# Patient Record
Sex: Female | Born: 1940
Health system: Southern US, Community
[De-identification: ages and names within clinical notes are randomized; demographics above are authoritative.]

## PROBLEM LIST (undated history)

## (undated) DIAGNOSIS — Z923 Personal history of irradiation: Secondary | ICD-10-CM

## (undated) DIAGNOSIS — M1990 Unspecified osteoarthritis, unspecified site: Secondary | ICD-10-CM

## (undated) DIAGNOSIS — M069 Rheumatoid arthritis, unspecified: Secondary | ICD-10-CM

## (undated) DIAGNOSIS — R0600 Dyspnea, unspecified: Secondary | ICD-10-CM

## (undated) DIAGNOSIS — Z86711 Personal history of pulmonary embolism: Secondary | ICD-10-CM

## (undated) DIAGNOSIS — G43909 Migraine, unspecified, not intractable, without status migrainosus: Secondary | ICD-10-CM

## (undated) DIAGNOSIS — R0789 Other chest pain: Secondary | ICD-10-CM

## (undated) DIAGNOSIS — D6861 Antiphospholipid syndrome: Secondary | ICD-10-CM

## (undated) DIAGNOSIS — I499 Cardiac arrhythmia, unspecified: Secondary | ICD-10-CM

## (undated) DIAGNOSIS — I8001 Phlebitis and thrombophlebitis of superficial vessels of right lower extremity: Secondary | ICD-10-CM

## (undated) DIAGNOSIS — J849 Interstitial pulmonary disease, unspecified: Secondary | ICD-10-CM

## (undated) DIAGNOSIS — J8410 Pulmonary fibrosis, unspecified: Secondary | ICD-10-CM

## (undated) DIAGNOSIS — H269 Unspecified cataract: Secondary | ICD-10-CM

## (undated) DIAGNOSIS — R200 Anesthesia of skin: Secondary | ICD-10-CM

## (undated) DIAGNOSIS — E785 Hyperlipidemia, unspecified: Secondary | ICD-10-CM

## (undated) DIAGNOSIS — I10 Essential (primary) hypertension: Secondary | ICD-10-CM

## (undated) DIAGNOSIS — C801 Malignant (primary) neoplasm, unspecified: Secondary | ICD-10-CM

## (undated) DIAGNOSIS — E669 Obesity, unspecified: Secondary | ICD-10-CM

## (undated) DIAGNOSIS — K219 Gastro-esophageal reflux disease without esophagitis: Secondary | ICD-10-CM

## (undated) DIAGNOSIS — R32 Unspecified urinary incontinence: Secondary | ICD-10-CM

## (undated) DIAGNOSIS — Z9989 Dependence on other enabling machines and devices: Secondary | ICD-10-CM

## (undated) HISTORY — PX: CARPAL TUNNEL RELEASE: SHX101

## (undated) HISTORY — DX: Unspecified cataract: H26.9

## (undated) HISTORY — DX: Hyperlipidemia, unspecified: E78.5

## (undated) HISTORY — DX: Gastro-esophageal reflux disease without esophagitis: K21.9

## (undated) HISTORY — DX: Essential (primary) hypertension: I10

## (undated) HISTORY — DX: Obesity, unspecified: E66.9

## (undated) HISTORY — DX: Antiphospholipid syndrome: D68.61

## (undated) HISTORY — DX: Unspecified osteoarthritis, unspecified site: M19.90

## (undated) HISTORY — PX: CARDIAC CATHETERIZATION: SHX172

## (undated) HISTORY — DX: Personal history of pulmonary embolism: Z86.711

---

## 1988-10-05 DIAGNOSIS — I8000 Phlebitis and thrombophlebitis of superficial vessels of unspecified lower extremity: Secondary | ICD-10-CM | POA: Insufficient documentation

## 1997-10-05 HISTORY — PX: ROTATOR CUFF REPAIR: SHX139

## 1999-08-14 ENCOUNTER — Other Ambulatory Visit: Admission: RE | Admit: 1999-08-14 | Discharge: 1999-08-14 | Payer: Self-pay | Admitting: Gynecology

## 1999-08-15 ENCOUNTER — Encounter: Admission: RE | Admit: 1999-08-15 | Discharge: 1999-08-15 | Payer: Self-pay | Admitting: Family Medicine

## 1999-08-15 ENCOUNTER — Encounter: Payer: Self-pay | Admitting: Family Medicine

## 2000-08-09 ENCOUNTER — Encounter: Admission: RE | Admit: 2000-08-09 | Discharge: 2000-08-09 | Payer: Self-pay | Admitting: Family Medicine

## 2000-08-09 ENCOUNTER — Encounter: Payer: Self-pay | Admitting: Family Medicine

## 2000-08-30 ENCOUNTER — Encounter: Admission: RE | Admit: 2000-08-30 | Discharge: 2000-08-30 | Payer: Self-pay | Admitting: Orthopedic Surgery

## 2000-08-30 ENCOUNTER — Encounter: Payer: Self-pay | Admitting: Orthopedic Surgery

## 2001-01-14 ENCOUNTER — Encounter: Admission: RE | Admit: 2001-01-14 | Discharge: 2001-01-14 | Payer: Self-pay | Admitting: Family Medicine

## 2001-01-14 ENCOUNTER — Encounter: Payer: Self-pay | Admitting: Family Medicine

## 2001-01-24 ENCOUNTER — Encounter: Payer: Self-pay | Admitting: Family Medicine

## 2001-01-24 ENCOUNTER — Encounter: Admission: RE | Admit: 2001-01-24 | Discharge: 2001-01-24 | Payer: Self-pay | Admitting: Internal Medicine

## 2003-06-04 ENCOUNTER — Encounter: Payer: Self-pay | Admitting: Nurse Practitioner

## 2004-11-18 ENCOUNTER — Other Ambulatory Visit: Admission: RE | Admit: 2004-11-18 | Discharge: 2004-11-18 | Payer: Self-pay | Admitting: Family Medicine

## 2005-10-15 ENCOUNTER — Encounter: Admission: RE | Admit: 2005-10-15 | Discharge: 2005-10-15 | Payer: Self-pay | Admitting: Gynecology

## 2006-05-07 ENCOUNTER — Encounter: Payer: Self-pay | Admitting: Nurse Practitioner

## 2006-10-05 HISTORY — PX: DILATION AND CURETTAGE, DIAGNOSTIC / THERAPEUTIC: SUR384

## 2006-10-05 HISTORY — PX: CARDIOVASCULAR STRESS TEST: SHX262

## 2006-10-05 HISTORY — PX: TOTAL KNEE ARTHROPLASTY: SHX125

## 2006-10-05 LAB — CONVERTED CEMR LAB: Pap Smear: NORMAL

## 2006-10-06 ENCOUNTER — Encounter: Payer: Self-pay | Admitting: Family Medicine

## 2006-10-28 ENCOUNTER — Inpatient Hospital Stay (HOSPITAL_COMMUNITY): Admission: RE | Admit: 2006-10-28 | Discharge: 2006-10-31 | Payer: Self-pay | Admitting: Orthopedic Surgery

## 2007-01-24 ENCOUNTER — Encounter: Payer: Self-pay | Admitting: Family Medicine

## 2007-01-31 ENCOUNTER — Encounter: Payer: Self-pay | Admitting: Nurse Practitioner

## 2007-02-02 HISTORY — PX: CARDIOVASCULAR STRESS TEST: SHX262

## 2007-02-07 HISTORY — PX: US ECHOCARDIOGRAPHY: HXRAD669

## 2007-02-22 ENCOUNTER — Other Ambulatory Visit: Admission: RE | Admit: 2007-02-22 | Discharge: 2007-02-22 | Payer: Self-pay | Admitting: Gynecology

## 2007-03-22 ENCOUNTER — Ambulatory Visit: Admission: RE | Admit: 2007-03-22 | Discharge: 2007-03-22 | Payer: Self-pay | Admitting: Gynecologic Oncology

## 2007-03-22 ENCOUNTER — Encounter (INDEPENDENT_AMBULATORY_CARE_PROVIDER_SITE_OTHER): Payer: Self-pay | Admitting: Gynecologic Oncology

## 2007-04-05 ENCOUNTER — Ambulatory Visit (HOSPITAL_COMMUNITY): Admission: RE | Admit: 2007-04-05 | Discharge: 2007-04-05 | Payer: Self-pay | Admitting: Gynecologic Oncology

## 2007-04-05 ENCOUNTER — Encounter (INDEPENDENT_AMBULATORY_CARE_PROVIDER_SITE_OTHER): Payer: Self-pay | Admitting: Gynecologic Oncology

## 2008-01-04 ENCOUNTER — Ambulatory Visit: Payer: Self-pay | Admitting: Family Medicine

## 2008-01-04 DIAGNOSIS — E785 Hyperlipidemia, unspecified: Secondary | ICD-10-CM | POA: Insufficient documentation

## 2008-01-04 DIAGNOSIS — R87619 Unspecified abnormal cytological findings in specimens from cervix uteri: Secondary | ICD-10-CM

## 2008-01-04 DIAGNOSIS — K219 Gastro-esophageal reflux disease without esophagitis: Secondary | ICD-10-CM | POA: Insufficient documentation

## 2008-01-04 DIAGNOSIS — R5381 Other malaise: Secondary | ICD-10-CM

## 2008-01-04 DIAGNOSIS — M545 Low back pain: Secondary | ICD-10-CM

## 2008-01-04 DIAGNOSIS — M1990 Unspecified osteoarthritis, unspecified site: Secondary | ICD-10-CM | POA: Insufficient documentation

## 2008-01-04 DIAGNOSIS — R5383 Other fatigue: Secondary | ICD-10-CM | POA: Insufficient documentation

## 2008-01-04 DIAGNOSIS — I10 Essential (primary) hypertension: Secondary | ICD-10-CM | POA: Insufficient documentation

## 2008-01-04 DIAGNOSIS — M79609 Pain in unspecified limb: Secondary | ICD-10-CM | POA: Insufficient documentation

## 2008-01-10 DIAGNOSIS — R7303 Prediabetes: Secondary | ICD-10-CM | POA: Insufficient documentation

## 2008-01-10 LAB — CONVERTED CEMR LAB
ALT: 32 units/L (ref 0–35)
AST: 29 units/L (ref 0–37)
Alkaline Phosphatase: 87 units/L (ref 39–117)
BUN: 26 mg/dL — ABNORMAL HIGH (ref 6–23)
Chloride: 105 meq/L (ref 96–112)
Cholesterol: 236 mg/dL (ref 0–200)
Creatinine, Ser: 0.9 mg/dL (ref 0.4–1.2)
Eosinophils Absolute: 0.3 10*3/uL (ref 0.0–0.7)
Eosinophils Relative: 3.8 % (ref 0.0–5.0)
Folate: 7.3 ng/mL
GFR calc Af Amer: 80 mL/min
GFR calc non Af Amer: 66 mL/min
MCHC: 33 g/dL (ref 30.0–36.0)
Monocytes Relative: 9.2 % (ref 3.0–12.0)
Neutro Abs: 4 10*3/uL (ref 1.4–7.7)
Neutrophils Relative %: 51.8 % (ref 43.0–77.0)
Potassium: 4.1 meq/L (ref 3.5–5.1)
RBC: 3.78 M/uL — ABNORMAL LOW (ref 3.87–5.11)
Sodium: 141 meq/L (ref 135–145)
Total Bilirubin: 0.8 mg/dL (ref 0.3–1.2)
Total CHOL/HDL Ratio: 5.2
Total Protein: 7.3 g/dL (ref 6.0–8.3)
Vitamin B-12: 242 pg/mL (ref 211–911)

## 2008-02-01 ENCOUNTER — Ambulatory Visit: Payer: Self-pay | Admitting: Family Medicine

## 2008-02-01 LAB — CONVERTED CEMR LAB
HDL goal, serum: 40 mg/dL
LDL Goal: 130 mg/dL

## 2008-02-02 ENCOUNTER — Telehealth: Payer: Self-pay | Admitting: Family Medicine

## 2008-02-02 ENCOUNTER — Encounter: Payer: Self-pay | Admitting: Family Medicine

## 2008-02-03 ENCOUNTER — Ambulatory Visit: Payer: Self-pay | Admitting: Family Medicine

## 2008-02-03 DIAGNOSIS — J069 Acute upper respiratory infection, unspecified: Secondary | ICD-10-CM | POA: Insufficient documentation

## 2008-02-03 DIAGNOSIS — H6690 Otitis media, unspecified, unspecified ear: Secondary | ICD-10-CM | POA: Insufficient documentation

## 2008-02-06 ENCOUNTER — Telehealth: Payer: Self-pay | Admitting: Family Medicine

## 2008-02-06 ENCOUNTER — Ambulatory Visit: Payer: Self-pay

## 2008-02-06 ENCOUNTER — Encounter: Payer: Self-pay | Admitting: Family Medicine

## 2008-02-08 ENCOUNTER — Telehealth: Payer: Self-pay | Admitting: Family Medicine

## 2008-02-17 ENCOUNTER — Telehealth: Payer: Self-pay | Admitting: Family Medicine

## 2008-03-26 ENCOUNTER — Telehealth: Payer: Self-pay | Admitting: Internal Medicine

## 2008-03-27 ENCOUNTER — Ambulatory Visit: Payer: Self-pay | Admitting: Family Medicine

## 2008-05-03 ENCOUNTER — Ambulatory Visit: Payer: Self-pay | Admitting: Family Medicine

## 2008-05-09 LAB — CONVERTED CEMR LAB
ALT: 31 units/L (ref 0–35)
AST: 31 units/L (ref 0–37)
Albumin: 3.9 g/dL (ref 3.5–5.2)
Alkaline Phosphatase: 68 units/L (ref 39–117)
BUN: 22 mg/dL (ref 6–23)
Bilirubin, Direct: 0.1 mg/dL (ref 0.0–0.3)
Calcium: 8.7 mg/dL (ref 8.4–10.5)
Cholesterol: 230 mg/dL (ref 0–200)
GFR calc non Af Amer: 76 mL/min
Total Protein: 6.9 g/dL (ref 6.0–8.3)
Triglycerides: 162 mg/dL — ABNORMAL HIGH (ref 0–149)

## 2008-06-12 ENCOUNTER — Encounter: Payer: Self-pay | Admitting: Family Medicine

## 2008-06-13 ENCOUNTER — Ambulatory Visit: Payer: Self-pay | Admitting: Family Medicine

## 2008-06-13 ENCOUNTER — Ambulatory Visit: Payer: Self-pay

## 2008-06-13 ENCOUNTER — Encounter: Payer: Self-pay | Admitting: Family Medicine

## 2008-06-13 DIAGNOSIS — R42 Dizziness and giddiness: Secondary | ICD-10-CM

## 2008-06-13 DIAGNOSIS — R0989 Other specified symptoms and signs involving the circulatory and respiratory systems: Secondary | ICD-10-CM

## 2008-06-13 DIAGNOSIS — R0609 Other forms of dyspnea: Secondary | ICD-10-CM

## 2008-06-14 ENCOUNTER — Telehealth (INDEPENDENT_AMBULATORY_CARE_PROVIDER_SITE_OTHER): Payer: Self-pay | Admitting: *Deleted

## 2008-07-11 ENCOUNTER — Ambulatory Visit: Payer: Self-pay | Admitting: Family Medicine

## 2008-07-26 ENCOUNTER — Other Ambulatory Visit: Admission: RE | Admit: 2008-07-26 | Discharge: 2008-07-26 | Payer: Self-pay | Admitting: Gynecology

## 2008-08-27 ENCOUNTER — Encounter: Payer: Self-pay | Admitting: Family Medicine

## 2008-09-05 ENCOUNTER — Encounter (INDEPENDENT_AMBULATORY_CARE_PROVIDER_SITE_OTHER): Payer: Self-pay | Admitting: *Deleted

## 2008-09-17 ENCOUNTER — Telehealth: Payer: Self-pay | Admitting: Family Medicine

## 2008-10-30 ENCOUNTER — Ambulatory Visit: Payer: Self-pay | Admitting: Family Medicine

## 2008-10-31 LAB — CONVERTED CEMR LAB
ALT: 28 units/L (ref 0–35)
Albumin: 3.8 g/dL (ref 3.5–5.2)
Alkaline Phosphatase: 65 units/L (ref 39–117)
BUN: 17 mg/dL (ref 6–23)
Bilirubin, Direct: 0.1 mg/dL (ref 0.0–0.3)
Calcium: 9.5 mg/dL (ref 8.4–10.5)
Direct LDL: 160 mg/dL
Glucose, Bld: 91 mg/dL (ref 70–99)
Total Bilirubin: 0.6 mg/dL (ref 0.3–1.2)
Total CHOL/HDL Ratio: 5.7
Total Protein: 6.8 g/dL (ref 6.0–8.3)
VLDL: 26 mg/dL (ref 0–40)

## 2008-11-02 ENCOUNTER — Ambulatory Visit: Payer: Self-pay | Admitting: Family Medicine

## 2008-11-02 DIAGNOSIS — G5620 Lesion of ulnar nerve, unspecified upper limb: Secondary | ICD-10-CM

## 2008-11-02 LAB — CONVERTED CEMR LAB
Basophils Absolute: 0 10*3/uL (ref 0.0–0.1)
Basophils Relative: 0.1 % (ref 0.0–3.0)
Eosinophils Absolute: 0.3 10*3/uL (ref 0.0–0.7)
Folate: 9.9 ng/mL
HCT: 38.4 % (ref 36.0–46.0)
Hemoglobin: 13.2 g/dL (ref 12.0–15.0)
Monocytes Absolute: 0.5 10*3/uL (ref 0.1–1.0)
Monocytes Relative: 7.6 % (ref 3.0–12.0)
Neutrophils Relative %: 47.8 % (ref 43.0–77.0)
Platelets: 207 10*3/uL (ref 150–400)
RDW: 12.7 % (ref 11.5–14.6)
WBC: 7 10*3/uL (ref 4.5–10.5)

## 2008-12-10 ENCOUNTER — Telehealth: Payer: Self-pay | Admitting: Family Medicine

## 2008-12-17 ENCOUNTER — Telehealth: Payer: Self-pay | Admitting: Family Medicine

## 2009-01-10 ENCOUNTER — Ambulatory Visit: Payer: Self-pay | Admitting: Family Medicine

## 2009-01-31 ENCOUNTER — Ambulatory Visit: Payer: Self-pay | Admitting: Family Medicine

## 2009-02-01 LAB — CONVERTED CEMR LAB
ALT: 25 U/L (ref 0–35)
AST: 24 U/L (ref 0–37)
Cholesterol: 195 mg/dL (ref 0–200)
HDL: 43.3 mg/dL (ref >39.00)
LDL Cholesterol: 133 mg/dL — ABNORMAL HIGH (ref 0–99)
Total CHOL/HDL Ratio: 5
Triglycerides: 94 mg/dL (ref 0.0–149.0)
VLDL: 18.8 mg/dL (ref 0.0–40.0)

## 2009-02-06 ENCOUNTER — Ambulatory Visit: Payer: Self-pay | Admitting: Family Medicine

## 2009-08-14 ENCOUNTER — Ambulatory Visit: Payer: Self-pay | Admitting: Family Medicine

## 2009-08-15 LAB — CONVERTED CEMR LAB
AST: 24 units/L (ref 0–37)
Albumin: 3.9 g/dL (ref 3.5–5.2)
Calcium: 8.9 mg/dL (ref 8.4–10.5)
Creatinine, Ser: 0.7 mg/dL (ref 0.4–1.2)
Direct LDL: 190.7 mg/dL
Glucose, Bld: 101 mg/dL — ABNORMAL HIGH (ref 70–99)
Potassium: 3.7 meq/L (ref 3.5–5.1)
Sodium: 139 meq/L (ref 135–145)
Total Bilirubin: 0.7 mg/dL (ref 0.3–1.2)
Total CHOL/HDL Ratio: 6
VLDL: 23 mg/dL (ref 0.0–40.0)

## 2009-08-22 ENCOUNTER — Ambulatory Visit: Payer: Self-pay | Admitting: Family Medicine

## 2009-08-22 DIAGNOSIS — M25519 Pain in unspecified shoulder: Secondary | ICD-10-CM

## 2009-08-26 ENCOUNTER — Encounter: Payer: Self-pay | Admitting: Family Medicine

## 2009-08-26 ENCOUNTER — Ambulatory Visit: Payer: Self-pay | Admitting: Family Medicine

## 2009-08-28 ENCOUNTER — Encounter: Payer: Self-pay | Admitting: Family Medicine

## 2009-09-20 ENCOUNTER — Encounter (INDEPENDENT_AMBULATORY_CARE_PROVIDER_SITE_OTHER): Payer: Self-pay | Admitting: *Deleted

## 2009-10-11 ENCOUNTER — Telehealth: Payer: Self-pay | Admitting: Family Medicine

## 2009-11-14 ENCOUNTER — Telehealth: Payer: Self-pay | Admitting: Family Medicine

## 2009-12-27 ENCOUNTER — Ambulatory Visit: Payer: Self-pay | Admitting: Family Medicine

## 2010-01-02 LAB — CONVERTED CEMR LAB
ALT: 26 units/L (ref 0–35)
AST: 25 units/L (ref 0–37)
Albumin: 3.8 g/dL (ref 3.5–5.2)
Bilirubin, Direct: 0 mg/dL (ref 0.0–0.3)
CO2: 30 meq/L (ref 19–32)
Creatinine, Ser: 0.7 mg/dL (ref 0.4–1.2)
Direct LDL: 167.6 mg/dL
GFR calc non Af Amer: 87.74 mL/min (ref >60)
HDL: 40.8 mg/dL (ref >39.00)
Triglycerides: 121 mg/dL (ref 0.0–149.0)

## 2010-08-26 ENCOUNTER — Telehealth: Payer: Self-pay | Admitting: Family Medicine

## 2010-09-22 ENCOUNTER — Encounter
Admission: RE | Admit: 2010-09-22 | Discharge: 2010-09-22 | Payer: Self-pay | Source: Home / Self Care | Attending: Gynecology | Admitting: Gynecology

## 2010-10-26 ENCOUNTER — Encounter: Payer: Self-pay | Admitting: Gynecology

## 2010-11-04 NOTE — Progress Notes (Signed)
Summary: temazepam  Phone Note Refill Request Message from:  Fax from Pharmacy on August 26, 2010 2:53 PM  Refills Requested: Medication #1:  TEMAZEPAM 15 MG  CAPS Take 1 capsule by mouth at bedtime as needed for sleep   Supply Requested: 3 months [facility] [phone]   Method Requested: Electronic Initial call taken by: Benny Lennert CMA Duncan Dull),  August 26, 2010 2:53 PM  Follow-up for Phone Call        may phone in 30#, will defer to PCP on 90 day supply. Follow-up by: Eustaquio Boyden  MD,  August 26, 2010 3:58 PM  Additional Follow-up for Phone Call Additional follow up Details #1::        Notify pt I do not do 90 day supply on controlled substances.  Additional Follow-up by: Kerby Nora MD,  August 27, 2010 12:33 AM    Additional Follow-up for Phone Call Additional follow up Details #2::    Rx called in and patient notified. She was aware of no 90 day supply. She didn't request it. She said the pharmacy must have, maybe for insurance/cost?  Follow-up by: Janee Morn CMA Duncan Dull),  August 27, 2010 10:09 AM  Prescriptions: TEMAZEPAM 15 MG  CAPS (TEMAZEPAM) Take 1 capsule by mouth at bedtime as needed for sleep  #30 x 0   Entered and Authorized by:   Eustaquio Boyden  MD   Signed by:   Kerby Nora MD on 08/27/2010   Method used:   Telephoned to ...       [facility] [phone]* (retail)       [address] [place], [place]  [address]       Ph: [phone]       Fax: [phone]   RxID:   [id number]

## 2010-11-04 NOTE — Progress Notes (Signed)
Summary: DICLOFENAC SODIUM  Phone Note Refill Request Call back at [phone] Message from:  Patient on October 11, 2009 1:35 PM  Refills Requested: Medication #1:  DICLOFENAC SODIUM 75 MG  TBEC Take 1 tablet by mouth once daily to two times a day (sometimes) Patient calls and request a refill on above medication, please advise   Method Requested: Electronic Initial call taken by: Linde Gillis CMA Duncan Dull),  October 11, 2009 1:35 PM  Follow-up for Phone Call        [facility] past rx's sent, ? is this corrrect?Marland Kitchen..Marland Kitchenplease put pharm of choice next time. Follow-up by: Kerby Nora MD,  October 11, 2009 2:09 PM  Additional Follow-up for Phone Call Additional follow up Details #1::        Pt called back and [facility] was the correct pharmacy and pt will pick up med later today. Thank you. Lewanda Rife LPN  October 11, 2009 2:56 PM   Patient notified again. Additional Follow-up by: Linde Gillis CMA Duncan Dull),  October 11, 2009 2:57 PM    Prescriptions: DICLOFENAC SODIUM 75 MG  TBEC (DICLOFENAC SODIUM) Take 1 tablet by mouth once daily to two times a day (sometimes)  #60 x 3   Entered and Authorized by:   Kerby Nora MD   Signed by:   Kerby Nora MD on 10/11/2009   Method used:   Electronically to        [facility] [address] (retail)       [address] [place], [place]  [address]       Ph: [phone]       Fax: [phone]   RxID:   [id number]

## 2010-11-04 NOTE — Progress Notes (Signed)
Summary: regarding cholesterol med  Phone Note Call from Patient Call back at Home Phone [phone]   Caller: Patient Summary of Call: Pt states she took fenofibrate for one month back in november, but stopped because of legs and arms hurting.  She is asking if ok to restart it at one half pill a day and see how she does.  Please advise. Initial call taken by: Lowella Petties CMA,  November 14, 2009 9:57 AM  Follow-up for Phone Call        yes.Marland Kitchenokay to start back at 1/2 dose..fenofibrate should not really cause much muscle ache like statins do.  Follow-up by: Kerby Nora MD,  November 14, 2009 4:41 PM  Additional Follow-up for Phone Call Additional follow up Details #1::        Left message for patient to return call.  Linde Gillis CMA Duncan Dull)  November 14, 2009 4:53 PM   Spoke with patient and advised her as instructed.  She wants to know should she take this in the morning or in the evening.  Please advise. Additional Follow-up by: Linde Gillis CMA Duncan Dull),  November 18, 2009 8:13 AM    Additional Follow-up for Phone Call Additional follow up Details #2::    Either. Follow-up by: Kerby Nora MD,  November 18, 2009 10:55 AM  Additional Follow-up for Phone Call Additional follow up Details #3:: Details for Additional Follow-up Action Taken: patient advised.Consuello Masse CMA  Additional Follow-up by: Benny Lennert CMA Duncan Dull),  November 18, 2009 2:25 PM

## 2010-12-29 ENCOUNTER — Telehealth: Payer: Self-pay | Admitting: *Deleted

## 2010-12-29 NOTE — Telephone Encounter (Signed)
Patient advised.

## 2010-12-29 NOTE — Telephone Encounter (Signed)
Patient is going to be scheduling appointment for some numbness she is having in her hands. She is asking if she could schedule lab appt. Prior to check her cholesterol since she hasn't had it checked since last year. Please advise.

## 2010-12-29 NOTE — Telephone Encounter (Signed)
i would wait until OV  More of the history needs to be flushed out to figured out what needs to be done with patient and orders  Would check chol then

## 2010-12-31 ENCOUNTER — Telehealth: Payer: Self-pay | Admitting: *Deleted

## 2010-12-31 DIAGNOSIS — I10 Essential (primary) hypertension: Secondary | ICD-10-CM

## 2010-12-31 DIAGNOSIS — E7800 Pure hypercholesterolemia, unspecified: Secondary | ICD-10-CM

## 2010-12-31 NOTE — Telephone Encounter (Signed)
Pt is asking if she can come in and get labs done, before she goes out of town for the summer.  She says she doesn't need to be seen, just wants labs. Please advise.

## 2010-12-31 NOTE — Telephone Encounter (Signed)
Pt is overdue for medicare wellness visit.. Have her schedule when able. I added  labs into EPIC.Marland Kitchen Please schedule a lab visit prior to Medicare annual wellness appt.

## 2011-01-01 NOTE — Telephone Encounter (Signed)
Patient advised and appt made for labs

## 2011-01-05 ENCOUNTER — Other Ambulatory Visit (INDEPENDENT_AMBULATORY_CARE_PROVIDER_SITE_OTHER): Payer: 59 | Admitting: Family Medicine

## 2011-01-05 DIAGNOSIS — E7800 Pure hypercholesterolemia, unspecified: Secondary | ICD-10-CM

## 2011-01-05 DIAGNOSIS — I10 Essential (primary) hypertension: Secondary | ICD-10-CM

## 2011-01-05 LAB — COMPREHENSIVE METABOLIC PANEL
ALT: 23 U/L (ref 0–35)
Albumin: 4.1 g/dL (ref 3.5–5.2)
BUN: 18 mg/dL (ref 6–23)
CO2: 26 mEq/L (ref 19–32)
GFR: 118.03 mL/min (ref >60.00)
Glucose, Bld: 96 mg/dL (ref 70–99)
Potassium: 4 mEq/L (ref 3.5–5.1)
Total Bilirubin: 0.8 mg/dL (ref 0.3–1.2)

## 2011-01-05 LAB — LIPID PANEL
Cholesterol: 224 mg/dL — ABNORMAL HIGH (ref 0–200)
Total CHOL/HDL Ratio: 5
VLDL: 18.2 mg/dL (ref 0.0–40.0)

## 2011-01-11 ENCOUNTER — Other Ambulatory Visit: Payer: Self-pay | Admitting: Family Medicine

## 2011-02-17 NOTE — Op Note (Signed)
NAMEALIVIANA, [name]          ACCOUNT NO.:  [id number]   MEDICAL RECORD NO.:  [id number]          PATIENT TYPE:  AMB   LOCATION:  DAY                          FACILITY:  [facility]   PHYSICIAN:  John T. Kyla Balzarine, M.D.    DATE OF BIRTH:  July 08, 1941   DATE OF PROCEDURE:  04/05/2007  DATE OF DISCHARGE:                               OPERATIVE REPORT   PREOPERATIVE DIAGNOSIS:  Postmenopausal bleeding with inadequate  endometrial biopsy.   POSTOPERATIVE DIAGNOSIS:  Postmenopausal bleeding with inadequate  endometrial biopsy.   PROCEDURE:  Examination under anesthesia with fractional dilatation and  curettage.   SURGEON:  John T. Kyla Balzarine, M.D.   ANESTHESIA:  LMA general.   DESCRIPTION OF FINDINGS AND INDICATIONS FOR SURGERY:  This 70 year old  woman had menopausal bleeding with inadequate endometrial biopsy.  Examination under anesthesia revealed an upper limits size uterus that  sounded 7-8 cm.  No adnexal masses were palpated.  Scant endometrial  curettings were obtained.   DESCRIPTION OF PROCEDURE:  The patient was prepped and draped in the low  lithotomy position using direct placement stirrups.  Examination under  anesthesia revealed findings as described above.  The cervix was flush  with the vaginal fornices and the mucosa was atrophic.  The cervix was  grasped with a tenaculum and uterus sounded to approximately 7-8 cm.  The cervix was serially dilated to #23 [place] dilator and sounded  again to a similar size.  Four quadrant curettings were carried out  after endocervical curettings were obtained separately.  There was scant  tissue on the four quadrant curettings.  Circumferential curettage was  performed to uterine cry and the cavity was explored with polyp forceps,  again yielding scant fragments of tissue.  There was no bleeding after  removal of the tenaculum.  The patient tolerated the procedure well and  was returned to the recovery room in stable condition.   Estimated blood  loss nil.   TRANSFUSIONS:  None.   DRAINS AND PACKS:  None.   COUNTS:  Sponge and sponge counts correct.   PATHOLOGY SPECIMEN:  Endocervical and endometrial curettings for final  pathology.      John T. Kyla Balzarine, M.D.  Electronically Signed     JTS/MEDQ  D:  04/05/2007  T:  04/05/2007  Job:  [id number]   cc:   Leatha Gilding. Mezer, M.D.  Fax: [phone]   Telford Nab, R.N.  [address]  [place], [place] [address]

## 2011-02-17 NOTE — Consult Note (Signed)
[name], [name]          ACCOUNT NO.:  [id number]   MEDICAL RECORD NO.:  [id number]          PATIENT TYPE:  OUT   LOCATION:  GYN                          FACILITY:  [facility]   PHYSICIAN:  Paola A. Duard Brady, MD    DATE OF BIRTH:  January 06, 1941   DATE OF CONSULTATION:  03/22/2007  DATE OF DISCHARGE:                                 CONSULTATION   REFERRING PHYSICIAN:  Leatha Gilding. Mezer, M.D.   The patient is seen today in consultation at the request of Dr. Chevis Pretty.  [name] is a 70 year old gravida 22, para 6, abortus 3, who has been  having postmenopausal bleeding since back in 2006.  In March 2006, she  underwent hysteroscopy D&C which revealed polyps and benign endometrial  tissue. She has continued to have intermittent bleeding since that time.  In November 2006, she had postmenopausal bleeding.  It appears that the  specimen was most consistent with an endocervical curettage as there was  HPV viruses effect.  In July 2007, she had bleeding. Endometrial biopsy  was performed that was limited secondary to scant material, but there  was no evidence of carcinoma.  She recently again represented with  postmenopausal bleeding. On May 20, she had a negative Pap smear at that  time. She came back on May 20 for sonohistogram.  The findings included  a symptomatic cervix and endometrial stripe of 6.7 mm with fluid.  Ovaries were normal.  There was no free fluid. On saline sonogram, she  had a normal cavity. Because of this continued postmenopausal bleeding,  she is referred to [place] today.  The patient is otherwise without  complaints.  She states that when she had her bleeding in May 2007, she  was significantly constipated, had to bear  down a significant amount  have a bowel movement and then noticed a brown sandy discharge.  She  otherwise felt that she was about to have her period, and it was very  much like a normal period was when she was younger.  She has been on  hormone replacement  therapy for 4 years, starting at the age of 26-54.  She stopped it secondary to significant bleeding.  She states her mother  also had scant postmenopausal bleeding throughout her life.   PAST MEDICAL HISTORY:  Hypertension.   MEDICATIONS:  Enalapril, Avapro, Voltaren p.r.n.   ALLERGIES:  CODEINE which cause nausea and vomiting.   PAST SURGICAL HISTORY:  1. Left knee replacement.  2. Bilateral rotator cuff repair as to when   PAST GYN HISTORY:  She is gravida 9, para 6-4-3.   SOCIAL HISTORY:  She denies use of tobacco.  She drinks alcohol every  evening.  She has been married for 51 years.   FAMILY HISTORY:  Mother had hypertension.  Her father had hypertension.  She had a brother who died of probable lung cancer.   PHYSICAL EXAMINATION:  VITAL SIGNS:  High 5 feet 4 inches,  weight 191  pounds.  GENERAL:  Well-nourished, well-developed female in no acute distress.  PELVIC:  External genitalia is within normal limits, though somewhat  atrophic.  The  cervix is multiparous.  The vagina is clean.  There are  no visible lesions or discharge.  After obtaining the patient's verbal  consent, the cervix was grasped with on the anterior lip with a single-  tooth tenaculum.  A finder was inserted through the internal os without  difficulty.  Endometrial biopsy was performed.  The uterus sounded to  7.5 cm.  Approximately 2-3 mL  of a motor oil type fluid was obtained.  There was scant other tissue. Three passes were performed.  The patient  tolerated it well.   ASSESSMENT:  A 70 year old with postmenopausal bleeding with negative  workup today.  Plan to follow up the results of her biopsy from today  and determine her disposition pending the results.      Paola A. Duard Brady, MD  Electronically Signed     PAG/MEDQ  D:  03/22/2007  T:  03/22/2007  Job:  [id number]   cc:   Leatha Gilding. Mezer, M.D.  Fax: [phone]   Donia Guiles, M.D.  Fax: [phone]   Telford Nab, R.N.  [address]  [place], [place] [address]

## 2011-02-20 NOTE — Discharge Summary (Signed)
NAMEBRENIYA, [name]          ACCOUNT NO.:  [id number]   MEDICAL RECORD NO.:  [id number]          PATIENT TYPE:  INP   LOCATION:  1503                         FACILITY:  [facility]   PHYSICIAN:  Madlyn Frankel. Charlann Boxer, M.D.  DATE OF BIRTH:  1941-05-11   DATE OF ADMISSION:  10/28/2006  DATE OF DISCHARGE:  10/31/2006                               DISCHARGE SUMMARY   ADMISSION DIAGNOSES:  1. Osteoarthritis.  2. Hypertension.  3. Uterine fibroids.   DISCHARGE DIAGNOSES:  1. Hypertension.  2. Osteoarthritis.  3. Uterine fibroids.  4. Postoperative hypokalemia.   CONSULTATIONS:  None.   PROCEDURE:  Left total knee replacement.   SURGEON:  Dr. Durene Romans.   ASSISTANT:  Dwyane Luo, PA-C.   HISTORY OF PRESENT ILLNESS:  [name] is a 70 year old female with a  history of persistent progressive knee pain.  It has been refractory to  all conservative treatments.  She did discuss with Dr. 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  She was scheduled for a left total knee  replacement.   Preadmission labs included CBC with a hemoglobin 13.2, hematocrit 38.6  tracked throughout her course of stay.  She was stable upon discharge  with a hemoglobin of 10.9, hematocrit 31.8.  Preadmission white cell differential within normal limits.  Preadmission coagulation INR 0.9.  Preadmission routine chemistries showed sodium 146, potassium 4.1.  Glucose 142 tracked throughout her course of stay.  She did have a drop  in potassium.  On postop day #1, her potassium was 3.2.  Her sodium was  134; glucose was 16.  She was given some K-Dur.  Sodium 134, potassium  2.9, glucose 118.  On the next day it was 127.  Her sodium was 138,  potassium 3.2, glucose 122.  Preadmission urinalysis negative.  EKG showed normal sinus rhythm.   HOSPITAL COURSE:  The patient underwent knee replacement and was  admitted to the orthopedic floor in stable  and improved condition.  Pain  was well-controlled.  She remained neuromuscularly and vascularly intact  throughout her course of stay.  Her dressing was clean, dry and intact  on postop day #1.  PT and OT was begun with weightbearing as tolerated.  We gave her some K-Dur 40 mEq to help replace some of her potassium.  We  encouraged some juice and soda to increase her sodium.  Her discharge  plan was on for Sunday of that week.  PT originally documented that the  patient was motivated and wanted to be discharged home with family as  quickly as possible with home health care physical therapy.  Occupational therapy in their assessment recommended some home health  care occupational therapy.  After further evaluation, they determined  there were no acute occupational therapy needs.  On postop day #2, the  patient was doing well.  The dressing was changed.  Her wound was intact  with no active drainage.  She had a negative straight leg raise.  Discharge plan was for the next day.  Her potassium was 2.9.  Again, we  replaced it with some  more K-Dur.  On postop day #3,  the patient was  doing well, ready to be discharged home.  She was afebrile.  Her  potassium was up to 3.2.  Her incision was clean, dry and intact with no  active drainage.  She was neuromuscularly and vascularly intact.  She  was ready for discharge home.   DISCHARGE DISPOSITION:  Discharged home in stable and improved condition  with home health care physical therapy.   DISCHARGE MEDICATIONS:  1. Lovenox 30 mg, 1 subcu q.12 h. x11 days.  2. Norco 5/325, 1-2 p.o. q.4-6 h. p.r.n. pain.  3. Robaxin 500 mg, 1 p.o. q.6 h. p.r.n. muscle spasm.  4. Colace 100 mg, 1 p.o. b.i.d. constipation.  5. Enteric-coated aspirin 325 mg, 1 tablet daily x4 weeks after      Lovenox.  6. Iron 325 mg 1 p.o. t.i.d. x2 weeks.  7. K-Dur 1 tablet daily x2 weeks.   HOME MEDICATIONS:  Atenolol with hydrochlorothiazide 50/25, 1 p.o.  q.a.m.    DISCHARGE WOUND CARE:  Keep wound dry.  Change dressings daily.   DISCHARGE PHYSICAL THERAPY:  She is weightbearing as tolerated with the  use of a rolling walker.  After 2 weeks, will transition to a single-  point cane.  Goals of therapy will be to minimize pain, increase range  of motion, maximize strength, and encourage independence in activities  of daily living.  Goals of therapy will be 0-120 degrees at the 6-week  mark.   DISCHARGE FOLLOWUP:  Follow up with Dr. Charlann Boxer at [phone] in 2 weeks for  a wound care check.  If she develops any acute shortness of breath or  severe calf pain, please call emergency services immediately.     ______________________________  Yetta Glassman [address], [place]      Madlyn Frankel. Charlann Boxer, M.D.  Electronically Signed    BLM/MEDQ  D:  12/10/2006  T:  12/10/2006  Job:  [id number]

## 2011-02-20 NOTE — H&P (Signed)
[name], [name]          ACCOUNT NO.:  [id number]   MEDICAL RECORD NO.:  [id number]        PATIENT TYPE:  LINP   LOCATION:                               FACILITY:  [facility]   PHYSICIAN:  Madlyn Frankel. Charlann Boxer, M.D.  DATE OF BIRTH:  17-Oct-1940   DATE OF ADMISSION:  10/28/2006  DATE OF DISCHARGE:                              HISTORY & PHYSICAL   PROCEDURE:  Will be a left total knee replacement.   HISTORY AND PHYSICAL/CHIEF COMPLAINT:  Left knee pain.   HISTORY OF PRESENT ILLNESS:  This is a 70 year old female with advanced  osteoarthritis.  It has been refractory to all conservative treatment.  She is scheduled for a left total knee replacement.   PAST MEDICAL HISTORY:  Includes hypertension and uterine fibroids.   PAST SURGICAL HISTORY:  Includes right shoulder rotator cuff surgery in  1998 and left shoulder rotator cuff repair in 2004.   FAMILY HISTORY:  Hypertension, stroke.   SOCIAL HISTORY:  She is married to husband, [name].  He will be the  main caregiver after surgery.   DRUG ALLERGIES:  CODEINE.   MEDICATIONS:  Atenolol 50/25 1 tablet p.o. daily.   REVIEW OF SYSTEMS:  No new signs or symptoms of any cardiovascular,  respiratory, gastrointestinal, genitourinary, neurological,  musculoskeletal system complaints.   PHYSICAL EXAM:  Temperature 90.2. Pulse 68.  Respirations 18.  Blood  pressure 134/68.  GENERAL:  Awake, alert, and oriented, well-developed, well-nourished in  no acute distress.  NECK:  No carotid bruits.  CHEST/LUNGS:  Clear to auscultation bilaterally.  BREASTS:  Deferred.  HEART:  Regular rate and rhythm without gallops, clicks, rubs, or  murmurs.  ABDOMEN:  Soft, nontender, nondistended.  Bowel sounds present.  GENITOURINARY:  Deferred.  EXTREMITIES:  Walks with a slight limp, favoring lower left extremity.  Tenderness along the medial side of the knee.  Ligaments intact.  SKIN:  No sign of cellulitis.  Dorsalis pedis pulses positive.  NEUROLOGIC:  Intact distal sensibilities.   LABS:  Pending.   X-rays of the knee AP and lateral.   IMPRESSION:  Left knee osteoarthritis.   PLAN OF ACTION:  Left total knee replacement at [facility] on  October 28, 2006.  Surgeon, Dr. Durene Romans.  Risks and complications  were discussed.  Questions were encouraged, answered, and reviewed.     ______________________________  [name] [name], [place]      Madlyn Frankel. Charlann Boxer, M.D.  Electronically Signed    BLM/MEDQ  D:  10/25/2006  T:  10/25/2006  Job:  [id number]   cc:   Donia Guiles, M.D.  Fax: [phone]

## 2011-02-20 NOTE — Op Note (Signed)
NAMEBRITANI, BEATTIE          ACCOUNT NO.:  [id number]   MEDICAL RECORD NO.:  [id number]          PATIENT TYPE:  INP   LOCATION:  0005                         FACILITY:  [facility]   PHYSICIAN:  Madlyn Frankel. Charlann Boxer, M.D.  DATE OF BIRTH:  Mar 01, 1941   DATE OF PROCEDURE:  10/28/2006  DATE OF DISCHARGE:                               OPERATIVE REPORT   PREOPERATIVE DIAGNOSIS:  Left knee end-stage osteoarthritis.   POSTOPERATIVE DIAGNOSIS:  Left knee end-stage osteoarthritis.   PROCEDURE:  Left total knee replacement.   COMPONENTS USED:  DePuy rotating platform knee system posterior  stabilized with 2.5 femur, 2.5 tibia, 10 mm insert, and a size 35  patellar button.   SURGEON:  Dr. Charlann Boxer   ASSISTANT:  Dwyane Luo, PA-C   ANESTHESIA:  Dermal spinal plus MAC.   COMPLICATIONS:  None.   DRAINS:  x1.   TOURNIQUET TIME:  50 minutes 250 mmHg.   INDICATIONS FOR PROCEDURE:  Ms. Veith is a pleasant 70 year old  female presented the office for evaluation of some left knee pain.   This been progressive.  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   We discussed treatment option including knee replacement which she had  already been reviewed before by another surgeon.  Risks of infection,  DVT, persistent discomfort postop as well as a postoperative course and  expectations were all reviewed.  Consent obtained.   PROCEDURE IN DETAIL:  The patient was brought to the operative theater.  Once adequate anesthesia and preoperative antibiotics, 2 grams of Ancef,  were administered, the patient was positioned supine.  Proximal thigh  tourniquet was placed.  Left lower extremity was then prescrubbed then  prepped and draped in a sterile fashion from the ankle to the tourniquet  and covered with an impervious stockinette.  Midline incision was made  followed by modified  median parapatellar arthrotomy with patella  subluxation rather than eversion.  Knee exposure obtained in routine  fashion including partial meniscectomies.   Following knee exposure, the femoral canal was opened with a drill,  irrigated to prevent fat emboli, and intramedullary guide passed into  the femur.  It had 5 degrees of valgus; 10 mm of her bone was resected  off the distal femur.   I then measured the height of the femur and felt there it was a size  2.5.  The anterior, posterior, and chamfer cuts were all made without  complication or notching.   Rotation based on the posterior condylar axis was perpendicular to  Whiteside's line.   Following this, the trochlear box cut was made based on the lateral  aspect of the distal femur.   Following this, attention was directed to the tibia.  Tibial exposure  was obtained including finalizing the meniscectomies.  With the  extramedullary guide in position, I resected 10 mm of bone off the  lateral proximal tibia.   This was removed, debrided.  The cut surface measured 2.5.   With the 2.5 tibia in place, I checked the alignment  rod and was very  happy in the AP and lateral planes that the tibia was cut perpendicular.  It was then pinned in this position, drilled the keel punch for the  final tibia.   Please note that following the bone cuts, I checked with a 10 mm spacer,  and the knee came out to full extension.   The trial reduction was then carried out with the tibial component in  place, the femoral component placed, and the 10 mm insert.  The knee  came to full extension.   At this point, synovial tissue was debrided around the inferior and  proximal aspects of the patella.  I then measured the patella to be 22  mm, resected it down to 14 mm, and placed a 35 patellar button.  Post  cut and with the patellar button in place, it was back up to 23 mm.  The  patella tracked within the trochlear groove without application of  any  pressure with no subluxation.   At this point, the trial components were removed, final debridement of  the knee carried out as needed.   I then irrigated the wound with copious amounts of normal saline  solution.  I injected the knee with 60 mL of 0.25% Marcaine with  epinephrine and 1 mL of Toradol.   Following this, the final components were brought to the field.  Cement  was mixed on a back table.   With the knee exposed and dried, the components were cemented into  position with tibia first, then femur and then patella.  The 10 mm  insert was placed to allow the cement to cure.  Once the cement had  cured, excessive cement was debrided throughout the knee including the  posterior aspect of the knee medial and laterally.  There was no  obviously visible cement at this point, and so I was happy and satisfied  this was done.  The final 10 mm insert was placed.   At this point, the knee was again irrigated with 500 mL saline.  A  medium Hemovac drain was placed deep.  I reapproximated the extensor  mechanism using #1 Vicryl in the knee in flexion and subcutaneous layer  with 2-0  Vicryl in a running 4-0 Monocryl on the skin.  The skin was cleaned,  dried, and dressed sterilely with Steri-Strips and a bulky sterile  dressing.  The patient was brought to the recovery room in stable  condition with a knee immobilizer.      Madlyn Frankel Charlann Boxer, M.D.  Electronically Signed     MDO/MEDQ  D:  10/28/2006  T:  10/28/2006  Job:  [id number]

## 2011-03-19 ENCOUNTER — Encounter: Payer: Self-pay | Admitting: Cardiovascular Disease

## 2011-07-21 LAB — CBC
HCT: 37
Hemoglobin: 13

## 2011-07-21 LAB — BASIC METABOLIC PANEL
CO2: 29
GFR calc Af Amer: >60
Glucose, Bld: 117 — ABNORMAL HIGH
Potassium: 4.2
Sodium: 141

## 2011-07-21 LAB — DIFFERENTIAL
Lymphocytes Relative: 30
Monocytes Relative: 7
Neutrophils Relative %: 58

## 2011-08-07 ENCOUNTER — Ambulatory Visit (INDEPENDENT_AMBULATORY_CARE_PROVIDER_SITE_OTHER): Payer: 59 | Admitting: Family Medicine

## 2011-08-07 ENCOUNTER — Encounter: Payer: Self-pay | Admitting: Family Medicine

## 2011-08-07 VITALS — BP 120/74 | HR 72 | Temp 98.5°F | Ht 64.0 in | Wt 193.8 lb

## 2011-08-07 DIAGNOSIS — M5430 Sciatica, unspecified side: Secondary | ICD-10-CM

## 2011-08-07 DIAGNOSIS — M5432 Sciatica, left side: Secondary | ICD-10-CM

## 2011-08-07 MED ORDER — TRAMADOL HCL 50 MG PO TABS: 50.0000 mg | ORAL_TABLET | Freq: Four times a day (QID) | ORAL | Status: AC | PRN

## 2011-08-07 MED ORDER — PREDNISONE 20 MG PO TABS: ORAL_TABLET | ORAL | Status: DC

## 2011-08-07 MED ORDER — CYCLOBENZAPRINE HCL 5 MG PO TABS: ORAL_TABLET | ORAL | Status: DC

## 2011-08-07 NOTE — Patient Instructions (Signed)
Ice then heat on buttock.  Start prednisone taper, take in mornings.  Use pain medicine during day as needed, may use muscle relaxant at night.  Start gentle stretching exercises as tolerated.  Follow up if not improving as expected in 2 weeks.

## 2011-08-07 NOTE — Assessment & Plan Note (Addendum)
May also have some element of gluteal strain and spasm. Treat with prednisone given radicular pain and positive SLR. Pain control with tramadol and cyclobenzaprine for muscle relaxant.  Given info on gentle stretches, ice.

## 2011-08-07 NOTE — Progress Notes (Signed)
  Subjective:    Patient ID: [name], female    DOB: Feb 13, 1941, 70 y.o.   MRN: [id number]  HPI  70 year old female with hx of HTN, high cholesterol, prediabetes not seen in our office since 2010.. Presents with new onset left buttock pain off and over past few month.  Then 1 week ago when moved large piece of furniture.. Felt  "tear in buttock"  Immediate sharp pain, she fell to floor with pain. Now cannot sit on left buttock. Pain does run down left posterior leg to knee. No numbness, no weakness in legs.  Pain better with lying down and not moving. Has chronic mild  low back pain.   She has been taking tylenol, limited activity, tried diclofenac for pain but stopped because she felt may be making her dizzy.  Has CPX at [facility] yearly. .  Review of Systems  Constitutional: Negative for fever and fatigue.  HENT: Negative for ear pain.   Eyes: Negative for pain.  Respiratory: Negative for chest tightness and shortness of breath.   Cardiovascular: Negative for chest pain, palpitations and leg swelling.  Gastrointestinal: Negative for abdominal pain.  Genitourinary: Negative for dysuria.       Objective:   Physical Exam  Constitutional: She is oriented to person, place, and time. She appears well-developed and well-nourished. No distress.  HENT:  Head: Normocephalic and atraumatic.  Eyes: Conjunctivae and EOM are normal. Pupils are equal, round, and reactive to light.  Neck: Normal range of motion. Neck supple. No thyromegaly present.  Cardiovascular: Normal rate, regular rhythm, normal heart sounds and intact distal pulses.  Exam reveals no gallop and no friction rub.   No murmur heard. Pulmonary/Chest: Effort normal and breath sounds normal.  Musculoskeletal:       Lumbar back: She exhibits decreased range of motion and tenderness. She exhibits no bony tenderness.       Mild chronic low back pain Pain greatest in left buttock at sciatic notch. Positive SLR, neg  faber's on left.   Neurological: She is alert and oriented to person, place, and time.  Skin: She is not diaphoretic.          Assessment & Plan:

## 2011-09-01 ENCOUNTER — Ambulatory Visit (INDEPENDENT_AMBULATORY_CARE_PROVIDER_SITE_OTHER): Payer: 59 | Admitting: Family Medicine

## 2011-09-01 ENCOUNTER — Encounter: Payer: Self-pay | Admitting: Family Medicine

## 2011-09-01 VITALS — BP 120/72 | HR 97 | Temp 98.0°F | Ht 65.0 in | Wt 195.0 lb

## 2011-09-01 DIAGNOSIS — M79609 Pain in unspecified limb: Secondary | ICD-10-CM

## 2011-09-01 DIAGNOSIS — S53449A Ulnar collateral ligament sprain of unspecified elbow, initial encounter: Secondary | ICD-10-CM

## 2011-09-01 DIAGNOSIS — M79641 Pain in right hand: Secondary | ICD-10-CM

## 2011-09-01 LAB — CBC WITH DIFFERENTIAL/PLATELET
Basophils Absolute: 0 10*3/uL (ref 0.0–0.1)
Basophils Relative: 0.4 % (ref 0.0–3.0)
Eosinophils Absolute: 0.3 10*3/uL (ref 0.0–0.7)
Eosinophils Relative: 2.9 % (ref 0.0–5.0)
Lymphocytes Relative: 28 % (ref 12.0–46.0)
MCV: 92 fl (ref 78.0–100.0)
Neutro Abs: 5.8 10*3/uL (ref 1.4–7.7)
WBC: 9.5 10*3/uL (ref 4.5–10.5)

## 2011-09-01 LAB — URIC ACID: Uric Acid, Serum: 5 mg/dL (ref 2.4–7.0)

## 2011-09-01 MED ORDER — HYDROCODONE-ACETAMINOPHEN 5-500 MG PO TABS: 1.0000 | ORAL_TABLET | Freq: Four times a day (QID) | ORAL | Status: AC | PRN

## 2011-09-01 MED ORDER — DICLOFENAC SODIUM 75 MG PO TBEC: 75.0000 mg | DELAYED_RELEASE_TABLET | Freq: Two times a day (BID) | ORAL | Status: DC

## 2011-09-01 NOTE — Patient Instructions (Signed)
Recheck in 3 weeks 

## 2011-09-01 NOTE — Progress Notes (Signed)
  Patient Name: [name] Date of Birth: 1941-07-16 Age: 71 y.o. Medical Record Number: [id number] Gender: female  History of Present Illness:  [name] is a 70 y.o. very pleasant female patient who presents with the following:  R hand -- Sunday, 3 days ago.  Mostly all R thumb.  The patient awoke Sunday, 3 days ago, with some right-sided radial side it hand and thumb pain. No trauma or incident initially. She continued to have some pain throughout the course of the day. 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  She does have a young grandchild, and when she was picking the child up during the day she felt some acute pain on the ulnar side of her MCP joint on the first digit on the right hand. She continued to have some significant swelling. She has taken some anti-inflammatories, and she put some Voltaren gel. That did not help, and she is here for evaluation.  She has no history of gout or pseudogout that she can recall.  Past Medical History, Surgical History, Social History, Family History, and Problem List have been reviewed in EHR and updated if relevant.  Review of Systems:  GEN: No fevers, chills. Nontoxic. Primarily MSK c/o today. MSK: Detailed in the HPI GI: tolerating PO intake without difficulty Neuro: No numbness, parasthesias, or tingling associated. Otherwise the pertinent positives of the ROS are noted above.    Physical Examination: Filed Vitals:   09/01/11 1049  BP: 120/72  Pulse: 97  Temp: 98 F (36.7 C)  TempSrc: Oral  Height: 5\' 5"  (1.651 m)  Weight: 195 lb (88.451 kg)  SpO2: 97%     GEN: WDWN, NAD, Non-toxic, Alert & Oriented x 3 HEENT: Atraumatic, Normocephalic.  Ears and Nose: No external deformity. EXTR: No clubbing/cyanosis/edema NEURO: Normal gait.  PSYCH: Normally interactive. Conversant. Not depressed or anxious appearing.  Calm demeanor.   Right hand: Full range of motion. There is some diffuse swelling more  on the radial aspect, but also relatively diffuse on the dorsum of the hand. She has some significant synovitis on all MCP joints. There is greatest swelling and pain around the first MCP. There is some mild degree of redness. There is no warmth. There is also some bogginess at the wrist. Flexion and extension is intact throughout in all digits. The patient has no significant pain with the medial collateral ligament being stressed at the first MCP.Ulnar collateral ligament stress causes significant pain, but he does not open.  Assessment and Plan: 1. Hand pain, right  Uric acid, CBC with Differential  2. Ulnar collateral ligament sprain      Examination most consistent with ulnar collateral ligament injury of the right hand. Place the patient in a thumb spica splint. We will keep her on mobilized over the next 3 weeks. She can use tramadol up to 100 mg or times a day. I also gave her some Vicodin to use on a when necessary basis if she has severe pain.  I also would like to check a CBC to rule out potential infection or cellulitis, and a uric acid since gout would be in my differential with her pain showing a prior injury.

## 2011-09-04 ENCOUNTER — Ambulatory Visit (INDEPENDENT_AMBULATORY_CARE_PROVIDER_SITE_OTHER): Payer: 59 | Admitting: Internal Medicine

## 2011-09-04 ENCOUNTER — Encounter: Payer: Self-pay | Admitting: Internal Medicine

## 2011-09-04 VITALS — BP 139/75 | HR 83 | Temp 98.5°F | Ht 65.0 in | Wt 195.8 lb

## 2011-09-04 DIAGNOSIS — M79609 Pain in unspecified limb: Secondary | ICD-10-CM

## 2011-09-04 DIAGNOSIS — M79641 Pain in right hand: Secondary | ICD-10-CM | POA: Insufficient documentation

## 2011-09-04 MED ORDER — DICLOFENAC SODIUM 75 MG PO TBEC: 75.0000 mg | DELAYED_RELEASE_TABLET | Freq: Two times a day (BID) | ORAL | Status: DC

## 2011-09-04 NOTE — Assessment & Plan Note (Signed)
Has indurated area that may be related to initial injury using knife No discrete mass  No infection  P: continue splint per Dr Patsy Lager     Analgesics     Can try ice if gets sore instead of heat

## 2011-09-04 NOTE — Progress Notes (Signed)
  Subjective:    Patient ID: [name], female    DOB: 04-08-1941, 70 y.o.   MRN: [id number]  HPI Got nervous with right hand Used the spica splint yesterday and noticed a lump--in interdigital space between first and second fingers Increased pain yesterday and today Needed the pain pills  Some redness over the site Overall swelling may be some better from 3 days ago  No fever No known injury  Current Outpatient Prescriptions on File Prior to Visit  Medication Sig Dispense Refill  . diclofenac (VOLTAREN) 75 MG EC tablet Take 1 tablet (75 mg total) by mouth 2 (two) times daily.  60 tablet  2  . Enalapril-Hydrochlorothiazide 5-12.5 MG per tablet TAKE 1 TABLET DAILY  90 tablet  2  . HYDROcodone-acetaminophen (VICODIN) 5-500 MG per tablet Take 1 tablet by mouth every 6 (six) hours as needed for pain.  40 tablet  0    Allergies  Allergen Reactions  . Codeine     REACTION: Nausea and vomiting    Past Medical History  Diagnosis Date  . Hyperlipidemia   . Hypertension   . Osteoarthritis   . GERD (gastroesophageal reflux disease)     Past Surgical History  Procedure Date  . Cardiac catheterization 1990s    negative  . Cardiovascular stress test 2008    NML  . Dilation and curettage, diagnostic / therapeutic 2008  . Total knee arthroplasty 2008    Left  . Rotator cuff repair 1999    Right, left in 2002    Family History  Problem Relation Age of Onset  . Heart attack Father 35    PUD  . Coronary artery disease      Female 1st degree relative <50  . Lung cancer Brother   . Heart defect Sister     History   Social History  . Marital Status: Married    Spouse Name: N/A    Number of Children: N/A  . Years of Education: N/A   Occupational History  . Retired    Social History Main Topics  . Smoking status: Former Smoker -- 1.0 packs/day for 5 years  . Smokeless tobacco: Not on file  . Alcohol Use: Yes     2 liquor drinks a night  . Drug Use: No  .  Sexually Active: Not on file   Other Topics Concern  . Not on file   Social History Narrative   Regular exercise: yes walks 1 mile a dayDiet: loves butter, fruit and veggies      Review of Systems No nausea or vomiting    Objective:   Physical Exam  Musculoskeletal:       Right hand is puffy Tender along thumb Other MCPs are non tender Indurated area in 1st interdigital space Pink without warmth   With brace on , no rubbing along the indurated area          Assessment & Plan:

## 2011-09-18 ENCOUNTER — Other Ambulatory Visit: Payer: Self-pay | Admitting: Family Medicine

## 2011-09-22 ENCOUNTER — Ambulatory Visit: Payer: 59 | Admitting: Family Medicine

## 2011-10-05 ENCOUNTER — Other Ambulatory Visit: Payer: Self-pay | Admitting: Dermatology

## 2012-01-07 ENCOUNTER — Telehealth: Payer: Self-pay | Admitting: Cardiovascular Disease

## 2012-01-07 NOTE — Telephone Encounter (Signed)
App made, denies cp,sob,edema. C/o heart flutter 3 weeks, drinks 3-4 cups caffeine daily. Offered 4/8 app-declined, made app 01/12/12.

## 2012-01-07 NOTE — Telephone Encounter (Signed)
New msg Pt said she has been having heart flutter, no sob or chest pain. She wanted to discuss with nurse. Please call

## 2012-01-11 ENCOUNTER — Encounter: Payer: Self-pay | Admitting: *Deleted

## 2012-01-12 ENCOUNTER — Encounter: Payer: Self-pay | Admitting: Nurse Practitioner

## 2012-01-12 ENCOUNTER — Ambulatory Visit (INDEPENDENT_AMBULATORY_CARE_PROVIDER_SITE_OTHER): Payer: 59 | Admitting: Nurse Practitioner

## 2012-01-12 ENCOUNTER — Ambulatory Visit: Payer: 59 | Admitting: Nurse Practitioner

## 2012-01-12 VITALS — BP 138/72 | HR 78 | Ht 64.0 in | Wt 196.0 lb

## 2012-01-12 DIAGNOSIS — I10 Essential (primary) hypertension: Secondary | ICD-10-CM

## 2012-01-12 DIAGNOSIS — R002 Palpitations: Secondary | ICD-10-CM | POA: Insufficient documentation

## 2012-01-12 NOTE — Progress Notes (Signed)
   [name] Date of Birth: 08-06-1941 Medical Record #[id number]  History of Present Illness: Ms. Worthy is seen today for a work in visit. She is seen for Dr. Elease Hashimoto. She has not been seen here in over 3 years. We have no records. She has a history of HTN and HLD. She is obese. She does not tolerate statin therapies. Has significant OA.  She comes in today. She is here alone. She notes that for the past one month, she has had a fluttery type sensation in her chest. No pain/tightness. No SOB. Not really lightheaded or dizzy. No syncope. Does not happen every day. She does have about 4 cups of caffeine each day in the form of tea and soda. No alcohol use reported for the past 5 years. She remains active. Tries to walk. Energy level is pretty good. She says she has a history of an abnormal EKG. She has had recent labs with her PCP that were reportedly ok.  Current Outpatient Prescriptions on File Prior to Visit  Medication Sig Dispense Refill  . diclofenac (VOLTAREN) 75 MG EC tablet Take 1 tablet (75 mg total) by mouth 2 (two) times daily.  60 tablet  2  . Enalapril-Hydrochlorothiazide 5-12.5 MG per tablet TAKE 1 TABLET DAILY  90 tablet  1    Allergies  Allergen Reactions  . Codeine     REACTION: Nausea and vomiting    Past Medical History  Diagnosis Date  . Hyperlipidemia     statin intolerant  . Hypertension   . Osteoarthritis   . GERD (gastroesophageal reflux disease)   . Obesity     Past Surgical History  Procedure Date  . Cardiac catheterization 1990s    negative  . Cardiovascular stress test 2008    NML  . Dilation and curettage, diagnostic / therapeutic 2008  . Total knee arthroplasty 2008    Left  . Rotator cuff repair 1999    Right, left in 2002    History  Smoking status  . Former Smoker -- 1.0 packs/day for 5 years  Smokeless tobacco  . Not on file    History  Alcohol Use No    stopped 5 years ago    Family History  Problem Relation Age  of Onset  . Heart attack Father 29    PUD  . Hypertension Father   . Coronary artery disease      Female 1st degree relative <50  . Lung cancer Brother   . Heart defect Sister   . Hypertension Mother     Review of Systems: The review of systems is positive for palpitations.  All other systems were reviewed and are negative.  Physical Exam: BP 138/72  Pulse 78  Ht 5\' 4"  (1.626 m)  Wt 196 lb (88.905 kg)  BMI 33.64 kg/m2 Patient is very pleasant and in no acute distress. She is obese. Skin is warm and dry. Color is normal.  HEENT is unremarkable. Normocephalic/atraumatic. PERRL. Sclera are nonicteric. Neck is supple. No masses. No JVD. Lungs are clear. Cardiac exam shows a regular rate and rhythm. Abdomen is soft. Extremities are without edema. Gait and ROM are intact. No gross neurologic deficits noted.  LABORATORY DATA: EKG today shows sinus rhythm. She does have inferolateral ST and T wave changes. I do not have any old tracings to compare with.  Assessment / Plan:

## 2012-01-12 NOTE — Assessment & Plan Note (Signed)
Blood pressure looks good with her current regimen. 

## 2012-01-12 NOTE — Patient Instructions (Signed)
We are going to get an ultrasound of your heart  We are going to place a monitor to look at your heart rhythm for the next month  We will see you back in 5 weeks  Minimize your caffeine  Call the [facility] office at [phone] if you have any questions, problems or concerns.

## 2012-01-12 NOTE — Assessment & Plan Note (Signed)
She presents with a one month history of palpitations. Does have an abnormal EKG. We will place an event monitor for the next month and arrange for an echo. I will have her see Dr. Elease Hashimoto in about 5 weeks. She is advised to cut back her caffeine use. Patient is agreeable to this plan and will call if any problems develop in the interim.

## 2012-01-13 ENCOUNTER — Encounter: Payer: Self-pay | Admitting: *Deleted

## 2012-01-13 DIAGNOSIS — R002 Palpitations: Secondary | ICD-10-CM

## 2012-01-14 ENCOUNTER — Telehealth: Payer: Self-pay | Admitting: *Deleted

## 2012-01-14 NOTE — Telephone Encounter (Signed)
Follow up on previous call:  Patient calling to check the status of previous EKG.

## 2012-01-14 NOTE — Telephone Encounter (Signed)
01/14/12--pt calling wanting to know if her old records (specifcally--EKG)--have come from warehouse to compare with EKG she had in our office 2 days ago--advised lori gerhardt has left for day and i don't see any old records anywhere--i will send e-mail to lorie and have her or her nurse call ms Kissel with an answer--pt agrees--nt

## 2012-01-15 ENCOUNTER — Telehealth: Payer: Self-pay | Admitting: *Deleted

## 2012-01-15 NOTE — Telephone Encounter (Signed)
I did see her old records. Her EKG here in the office had more pronounced changes than her last EKG.

## 2012-01-15 NOTE — Telephone Encounter (Signed)
Phoned pt and made her aware of [name]'s evaluation of old EKG--pt agrees--nt

## 2012-01-18 NOTE — Telephone Encounter (Signed)
Pt called with information

## 2012-01-18 NOTE — Telephone Encounter (Signed)
Spoke with family member, pt not home/shopping, i will try to call later.

## 2012-01-20 ENCOUNTER — Ambulatory Visit (HOSPITAL_COMMUNITY): Payer: Medicare Other | Attending: Cardiology

## 2012-01-20 DIAGNOSIS — I10 Essential (primary) hypertension: Secondary | ICD-10-CM | POA: Insufficient documentation

## 2012-01-20 DIAGNOSIS — R002 Palpitations: Secondary | ICD-10-CM | POA: Insufficient documentation

## 2012-01-20 DIAGNOSIS — E785 Hyperlipidemia, unspecified: Secondary | ICD-10-CM | POA: Insufficient documentation

## 2012-01-20 DIAGNOSIS — Z79899 Other long term (current) drug therapy: Secondary | ICD-10-CM

## 2012-01-22 ENCOUNTER — Telehealth: Payer: Self-pay | Admitting: Cardiovascular Disease

## 2012-01-22 NOTE — Telephone Encounter (Signed)
New msg Pt wants to know test results Please call

## 2012-01-22 NOTE — Telephone Encounter (Signed)
CALLED RESULTS TO PT

## 2012-02-08 ENCOUNTER — Other Ambulatory Visit: Payer: Self-pay | Admitting: *Deleted

## 2012-02-08 NOTE — Telephone Encounter (Signed)
Okay to fill, 30, 0RF

## 2012-02-08 NOTE — Telephone Encounter (Signed)
Received faxed refill request from pharmacy for Temazepam 15 mg capsule, take one by mouth daily at bedtime as needed for sleep. Medication is no longer on medication sheet. Is it okay to refill medication?

## 2012-02-09 ENCOUNTER — Ambulatory Visit (INDEPENDENT_AMBULATORY_CARE_PROVIDER_SITE_OTHER): Payer: 59 | Admitting: Cardiovascular Disease

## 2012-02-09 ENCOUNTER — Encounter: Payer: Self-pay | Admitting: Cardiovascular Disease

## 2012-02-09 VITALS — BP 129/75 | HR 78 | Ht 64.0 in | Wt 198.4 lb

## 2012-02-09 DIAGNOSIS — E785 Hyperlipidemia, unspecified: Secondary | ICD-10-CM

## 2012-02-09 DIAGNOSIS — R002 Palpitations: Secondary | ICD-10-CM

## 2012-02-09 MED ORDER — TEMAZEPAM 15 MG PO CAPS: 15.0000 mg | ORAL_CAPSULE | Freq: Every evening | ORAL | Status: DC | PRN

## 2012-02-09 NOTE — Assessment & Plan Note (Signed)
[name] has a history of hyperlipidemia but has not tolerated statins in the past. We will we'll have her work on a good diet and exercise program. We'll check her fasting labs tomorrow.

## 2012-02-09 NOTE — Assessment & Plan Note (Addendum)
Pt continues to have palpitations.  The event monitor revealed premature atrial contractions, at least one run of supraventricular tachycardia and occasional premature ventricular contractions.  I offered to prescribe some Toprol-XL but she's tried this in the past and had some sort of reaction to it. She does not recall the reaction but does recall but she did not tolerate it very well.  Also suggested that we try propranolol but she did not want that either.  We will draw a TSH, basic metabolic profile, and fasting labs. We will get those tomorrow since she is not fasting today. I'll see her in 3 months for followup visit.

## 2012-02-09 NOTE — Patient Instructions (Addendum)
Your physician recommends that you schedule a follow-up appointment in: 3 months   Your physician recommends that you return for lab work in: tomorrow lab opens @ 8:30- 1:00 need to be fasting.   Your event monitor showed you had (PVC's) premature ventricular contractions and (svt) supraventricular tachycardia   Exercise daily

## 2012-02-09 NOTE — Progress Notes (Signed)
[name] Date of Birth  July 08, 1941       [facility]    [place] [address]  [address] [place], [place]  [address]   [place], [place]  [address] [phone]     [phone]   Fax  [phone]    Fax [phone]  Problem List: 1. Palpitations 2. Hyperlipidemia 3. Hypertension  History of Present Illness:  [name] is a 71 year old female with a history of palpitations and hyperlipidemia. I saw her in 2008. She recently saw only for some palpitations.  Her EKG reveals left ventricular hypertrophy with T-wave inversions consistent with repolarization abnormality.    She walks and plays golf on occasion.  She was recently seen by [name] for an episode  of palpitations. She had an echocardiogram which was found to be normal. She had a heart monitor that has revealed premature ventricular contractions and brief episode of SVT.   Current Outpatient Prescriptions on File Prior to Visit  Medication Sig Dispense Refill  . Enalapril-Hydrochlorothiazide 5-12.5 MG per tablet TAKE 1 TABLET DAILY  90 tablet  1  . temazepam (RESTORIL) 15 MG capsule Take 1 capsule (15 mg total) by mouth at bedtime as needed.  30 capsule  0  . DISCONTD: diclofenac (VOLTAREN) 75 MG EC tablet Take 1 tablet (75 mg total) by mouth 2 (two) times daily.  60 tablet  2  . DISCONTD: temazepam (RESTORIL) 15 MG capsule Take 15 mg by mouth at bedtime as needed.        Allergies  Allergen Reactions  . Codeine     REACTION: Nausea and vomiting    Past Medical History  Diagnosis Date  . Hyperlipidemia     statin intolerant  . Hypertension   . Osteoarthritis   . GERD (gastroesophageal reflux disease)   . Obesity     Past Surgical History  Procedure Date  . Cardiac catheterization 1990s    negative  . Cardiovascular stress test 2008    NML  . Dilation and curettage, diagnostic / therapeutic 2008  . Total knee arthroplasty 2008    Left  .  Rotator cuff repair 1999    Right, left in 2002  . US echocardiography 02/07/2007    EF 55-60%  . Cardiovascular stress test 02/02/2007    EF 70%, NO ISCHEMIA    History  Smoking status  . Former Smoker -- 1.0 packs/day for 5 years  Smokeless tobacco  . Not on file    History  Alcohol Use No    stopped 5 years ago    Family History  Problem Relation Age of Onset  . Heart attack Father 69    PUD  . Hypertension Father   . Coronary artery disease      Female 1st degree relative <50  . Lung cancer Brother   . Heart defect Sister   . Hypertension Mother     Reviw of Systems:  Reviewed in the HPI.  All other systems are negative.  Physical Exam: Blood pressure 129/75, pulse 78, height 5\' 4"  (1.626 m), weight 198 lb 6.4 oz (89.994 kg). General: Well developed, well nourished, in no acute distress.  Head: Normocephalic, atraumatic, sclera non-icteric, mucus membranes are moist,   Neck: Supple. Carotids are 2 + without bruits. No JVD  Lungs: Clear bilaterally to auscultation.  Heart: regular rate.  normal  S1 S2. No murmurs, gallops or rubs.  Abdomen: Soft, non-tender, non-distended with normal bowel sounds.  No hepatomegaly. No rebound/guarding. No masses.  Msk:  Strength and tone are normal  Extremities: No clubbing or cyanosis. No edema.  Distal pedal pulses are 2+ and equal bilaterally.  Neuro: Alert and oriented X 3. Moves all extremities spontaneously.  Psych:  Responds to questions appropriately with a normal affect.  ECG:   Assessment / Plan:

## 2012-02-09 NOTE — Telephone Encounter (Signed)
Medication added back and refill sent to pharmacy

## 2012-02-10 ENCOUNTER — Other Ambulatory Visit (INDEPENDENT_AMBULATORY_CARE_PROVIDER_SITE_OTHER): Payer: 59

## 2012-02-10 ENCOUNTER — Other Ambulatory Visit: Payer: Self-pay | Admitting: *Deleted

## 2012-02-10 DIAGNOSIS — E785 Hyperlipidemia, unspecified: Secondary | ICD-10-CM

## 2012-02-10 DIAGNOSIS — R002 Palpitations: Secondary | ICD-10-CM

## 2012-02-10 LAB — BASIC METABOLIC PANEL
BUN: 19 mg/dL (ref 6–23)
CO2: 28 mEq/L (ref 19–32)
Calcium: 9.2 mg/dL (ref 8.4–10.5)
Chloride: 104 mEq/L (ref 96–112)
Creatinine, Ser: 0.7 mg/dL (ref 0.4–1.2)

## 2012-02-10 LAB — LIPID PANEL
Cholesterol: 205 mg/dL — ABNORMAL HIGH (ref 0–200)
Total CHOL/HDL Ratio: 4
Triglycerides: 74 mg/dL (ref 0.0–149.0)
VLDL: 14.8 mg/dL (ref 0.0–40.0)

## 2012-02-10 LAB — HEPATIC FUNCTION PANEL
ALT: 21 U/L (ref 0–35)
AST: 24 U/L (ref 0–37)
Albumin: 3.9 g/dL (ref 3.5–5.2)
Total Bilirubin: 0.7 mg/dL (ref 0.3–1.2)

## 2012-02-10 LAB — LDL CHOLESTEROL, DIRECT: Direct LDL: 145.6 mg/dL

## 2012-02-10 MED ORDER — PROPRANOLOL HCL 10 MG PO TABS: 10.0000 mg | ORAL_TABLET | Freq: Four times a day (QID) | ORAL | Status: DC | PRN

## 2012-02-10 NOTE — Telephone Encounter (Signed)
Pt walked in/ post lab work, Dr Elease Hashimoto offered propranolol to pt yesterday and she declined. She changed her mind and would like to try, order sent. Explained how medication works and how to take it, Pt verbalized understanding. Alfonso Ramus RN

## 2012-02-15 ENCOUNTER — Telehealth: Payer: Self-pay | Admitting: *Deleted

## 2012-02-15 ENCOUNTER — Encounter: Payer: Self-pay | Admitting: *Deleted

## 2012-02-15 DIAGNOSIS — I471 Supraventricular tachycardia: Secondary | ICD-10-CM | POA: Insufficient documentation

## 2012-02-15 NOTE — Telephone Encounter (Signed)
LEFT MSG TO CALL BACK FOR ECARDIO MONITOR RESULTS, NUMBER PROVIDED.

## 2012-02-16 ENCOUNTER — Telehealth: Payer: Self-pay | Admitting: Cardiovascular Disease

## 2012-02-16 NOTE — Telephone Encounter (Signed)
Pt was given ecardio results (psvt) per Dr Elease Hashimoto.  She has propranolol to use for this at home.  She agrees to keep f/u appt with Dr Elease Hashimoto.

## 2012-02-16 NOTE — Telephone Encounter (Signed)
Pt rtn your call from yesterday 

## 2012-03-04 ENCOUNTER — Encounter: Payer: Self-pay | Admitting: Cardiovascular Disease

## 2012-03-16 ENCOUNTER — Other Ambulatory Visit: Payer: Self-pay | Admitting: Family Medicine

## 2012-05-17 ENCOUNTER — Ambulatory Visit: Payer: 59 | Admitting: Cardiovascular Disease

## 2012-07-19 ENCOUNTER — Telehealth: Payer: Self-pay | Admitting: Cardiovascular Disease

## 2012-07-19 NOTE — Telephone Encounter (Signed)
New Problem:    Patient called in needing a refill of her Enalapril-Hydrochlorothiazide 5-12.5 MG per tablet. Rx ID# [id number]. ZOX#[id number].

## 2012-07-20 ENCOUNTER — Other Ambulatory Visit: Payer: Self-pay | Admitting: Family Medicine

## 2012-07-21 MED ORDER — ENALAPRIL-HYDROCHLOROTHIAZIDE 5-12.5 MG PO TABS: 1.0000 | ORAL_TABLET | Freq: Every day | ORAL | Status: DC

## 2012-07-21 NOTE — Telephone Encounter (Signed)
Fax Received. Refill Completed. [name] (R.M.A)   

## 2012-07-25 ENCOUNTER — Ambulatory Visit (INDEPENDENT_AMBULATORY_CARE_PROVIDER_SITE_OTHER): Payer: Medicare Other | Admitting: Cardiovascular Disease

## 2012-07-25 ENCOUNTER — Encounter: Payer: Self-pay | Admitting: Cardiovascular Disease

## 2012-07-25 VITALS — BP 124/68 | HR 84 | Ht 64.0 in | Wt 203.1 lb

## 2012-07-25 DIAGNOSIS — R002 Palpitations: Secondary | ICD-10-CM

## 2012-07-25 DIAGNOSIS — I10 Essential (primary) hypertension: Secondary | ICD-10-CM

## 2012-07-25 NOTE — Patient Instructions (Addendum)
Your physician wants you to follow-up in: 6 months  You will receive a reminder letter in the mail two months in advance. If you don't receive a letter, please call our office to schedule the follow-up appointment.  Your physician recommends that you return for a FASTING lipid profile: 6 months   

## 2012-07-25 NOTE — Progress Notes (Signed)
[name] Date of Birth  11-15-1940       [facility]    [place] [address]  [address] [place], [place]  [address]   [place], [place]  [address] [phone]     [phone]   Fax  [phone]    Fax [phone]  Problem List: 1. Palpitations 2. Hyperlipidemia 3. Hypertension  History of Present Illness:  [name] is a 71 year old female with a history of palpitations and hyperlipidemia. I saw her in 2008. She has had  some palpitations.  Her EKG reveals left ventricular hypertrophy with T-wave inversions consistent with repolarization abnormality.    She walks and plays golf on occasion.  She was recently at [facility] had some problems breathing. She has palpitations only very rarely.  She was recently seen by Lawson Fiscal for an episode  of palpitations. She had an echocardiogram which was found to be normal. She had a heart monitor that has revealed premature ventricular contractions and brief episode of SVT.  She's been having some right knee problems. She is likely going to need to have some surgery on her right knee in the near future.   Current Outpatient Prescriptions on File Prior to Visit  Medication Sig Dispense Refill  . diclofenac (VOLTAREN) 75 MG EC tablet TAKE 1 TABLET TWICE A DAY  60 tablet  2  . Enalapril-Hydrochlorothiazide 5-12.5 MG per tablet Take 1 tablet by mouth daily.  90 tablet  3  . propranolol (INDERAL) 10 MG tablet Take 1 tablet (10 mg total) by mouth 4 (four) times daily as needed (for palpitations).  30 tablet  3  . temazepam (RESTORIL) 15 MG capsule Take 1 capsule (15 mg total) by mouth at bedtime as needed.  30 capsule  0    Allergies  Allergen Reactions  . Codeine     REACTION: Nausea and vomiting    Past Medical History  Diagnosis Date  . Hyperlipidemia     statin intolerant  . Hypertension   . Osteoarthritis   . GERD (gastroesophageal reflux disease)   . Obesity      Past Surgical History  Procedure Date  . Cardiac catheterization 1990s    negative  . Cardiovascular stress test 2008    NML  . Dilation and curettage, diagnostic / therapeutic 2008  . Total knee arthroplasty 2008    Left  . Rotator cuff repair 1999    Right, left in 2002  . US echocardiography 02/07/2007    EF 55-60%  . Cardiovascular stress test 02/02/2007    EF 70%, NO ISCHEMIA    History  Smoking status  . Former Smoker -- 1.0 packs/day for 5 years  Smokeless tobacco  . Not on file    History  Alcohol Use No    stopped 5 years ago    Family History  Problem Relation Age of Onset  . Heart attack Father 45    PUD  . Hypertension Father   . Coronary artery disease      Female 1st degree relative <50  . Lung cancer Brother   . Heart defect Sister   . Hypertension Mother     Reviw of Systems:  Reviewed in the HPI.  All other systems are negative.  Physical Exam: Blood pressure 124/68, pulse 84, height 5\' 4"  (1.626 m), weight 203 lb 1.9 oz (92.135 kg), SpO2 94.00%. General: Well developed, well nourished, in no acute distress.  Head: Normocephalic, atraumatic, sclera  non-icteric, mucus membranes are moist,   Neck: Supple. Carotids are 2 + without bruits. No JVD  Lungs: Clear bilaterally to auscultation.  Heart: regular rate.  normal  S1 S2. No murmurs, gallops or rubs.  Abdomen: Soft, non-tender, non-distended with normal bowel sounds. No hepatomegaly. No rebound/guarding. No masses.  Msk:  Strength and tone are normal  Extremities: No clubbing or cyanosis. No edema.  Distal pedal pulses are 2+ and equal bilaterally.  Neuro: Alert and oriented X 3. Moves all extremities spontaneously.  Psych:  Responds to questions appropriately with a normal affect.  ECG:   Assessment / Plan:

## 2012-07-25 NOTE — Assessment & Plan Note (Addendum)
[name]'s  blood pressure is well-controlled on the current dose of enalapril-hydrochlorothiazide. She has had a cough in the past with that but it seems to have resolved.  I suggested that we change her to losartan-HCTZ if she develops a cough that seems to be associated with the ACE inhibitor.  She has a baseline EKG abnormalities. These are probably due to her left ventricular hypertrophy.  She scheduled have knee surgery. She should be at low risk for upcoming knee surgery.  I will see her again in 6 months. Will check fasting labs at that time.

## 2012-07-25 NOTE — Assessment & Plan Note (Signed)
[name] seems to be doing well. She's not had any episodes of significant palpitations or arrhythmias. We will continue with her same medications. I'll see her again in 6 months.

## 2012-07-28 ENCOUNTER — Telehealth: Payer: Self-pay | Admitting: Cardiovascular Disease

## 2012-07-28 NOTE — Telephone Encounter (Signed)
I like the Lisinopril /HCTZ comb. Tablet.  I would recommend changing her to Lisinopril / HCTZ 10/25 - 1/2 tablet a day.  Recheck BP and BMP in 1 months

## 2012-07-28 NOTE — Telephone Encounter (Signed)
**Note De-Identified Kayvon Mo Obfuscation** Pt. Is changing pharmacies from [facility] to [facility]. She only has 5 tablets of Enalipril/hctz 5/12.5 mg left and [facility] does not carry 5/12.5. They do have 10/25 mg and pt. wants to know if she can have those and cut them in half. Also, she states that Dr. Elease Hashimoto has mentioned, in the past, to changing her to another medication all together. Please advise.

## 2012-07-28 NOTE — Telephone Encounter (Signed)
plz return call to patient [phone] regarding Enalapril RX questions.  Plz return call to patient after 2pm.

## 2012-07-29 MED ORDER — ENALAPRIL-HYDROCHLOROTHIAZIDE 10-25 MG PO TABS: 0.5000 | ORAL_TABLET | Freq: Every day | ORAL | Status: DC

## 2012-07-29 NOTE — Telephone Encounter (Signed)
Per Dr. Elease Hashimoto ok to continue with current medication.

## 2012-08-01 ENCOUNTER — Encounter: Payer: Self-pay | Admitting: Cardiovascular Disease

## 2012-08-03 ENCOUNTER — Encounter: Payer: Self-pay | Admitting: Cardiovascular Disease

## 2012-08-12 ENCOUNTER — Ambulatory Visit (INDEPENDENT_AMBULATORY_CARE_PROVIDER_SITE_OTHER): Payer: Medicare Other | Admitting: Family Medicine

## 2012-08-12 ENCOUNTER — Encounter: Payer: Self-pay | Admitting: Family Medicine

## 2012-08-12 VITALS — BP 128/64 | HR 84 | Temp 98.1°F | Wt 186.8 lb

## 2012-08-12 DIAGNOSIS — I10 Essential (primary) hypertension: Secondary | ICD-10-CM

## 2012-08-12 DIAGNOSIS — E785 Hyperlipidemia, unspecified: Secondary | ICD-10-CM

## 2012-08-12 DIAGNOSIS — Z01818 Encounter for other preprocedural examination: Secondary | ICD-10-CM

## 2012-08-12 NOTE — Patient Instructions (Addendum)
Return for fasting labs. Schedule medicare wellness if interested in next 3-6 months.

## 2012-08-12 NOTE — Assessment & Plan Note (Signed)
Due for re-eval. 

## 2012-08-12 NOTE — Progress Notes (Signed)
Subjective:    Patient ID: [name], female    DOB: [birth year], 71 y.o.   MRN: [id number]  HPI  71 year old female presents for pre-op clearance for upcoming right knee surgery.  Plans total replacement in 10/2011.  ORTHO Dr. Charlann Boxer. MRI showed severe medial joint arthritis changes.   She sees Dr. Elease Hashimoto cardiology for palpitations, PSVT and HTN. She saw him in 07/2012, he stated she was low cardiac risk for upcoming surgery.  Nml ECHO in 01/2012.  Cholesterol.. Inadequate control at last check in 02/2012.  Dr. Dorris Carnes suggested atorvastatin but pt wanted to first try lifestyle changes.   Past Surgical History   Procedure  Date   .  Cardiac catheterization  1990s     negative   .  Cardiovascular stress test  2008     NML   .  Dilation and curettage, diagnostic / therapeutic  2008   .  Total knee arthroplasty  2008     Left   .  Rotator cuff repair  1999     Right, left in 2002   .  US echocardiography  02/07/2007     EF 55-60%   .  Cardiovascular stress test  02/02/2007     EF 70%, NO ISCHEMIA     She is overdue for medicare wellness and we wills chedule this after the surgery.   She had seen GYN for postmenopausal bleeding in last year.  Sees Dr. Chevis Pretty.Marland Kitchen appt in 10/2012.   Refuses flu, uptodate with PNA, TD, zoster.     Review of Systems  Constitutional: Negative for fever, fatigue and unexpected weight change.  HENT: Negative for ear pain, congestion, sore throat, sneezing, trouble swallowing and sinus pressure.   Eyes: Negative for pain and itching.  Respiratory: Negative for cough, shortness of breath and wheezing.   Cardiovascular: Negative for chest pain, palpitations and leg swelling.  Gastrointestinal: Negative for nausea, abdominal pain, diarrhea, constipation and blood in stool.  Genitourinary: Negative for dysuria, hematuria, vaginal discharge, difficulty urinating and menstrual problem.       Stress incontinence.  Musculoskeletal: Positive for arthralgias.    Skin: Negative for rash.  Neurological: Negative for syncope, weakness, light-headedness, numbness and headaches.  Psychiatric/Behavioral: Negative for confusion and dysphoric mood. The patient is not nervous/anxious.        Objective:   Physical Exam  Constitutional: Vital signs are normal. She appears well-developed and well-nourished. She is cooperative.  Non-toxic appearance. She does not appear ill. No distress.  HENT:  Head: Normocephalic.  Right Ear: Hearing, tympanic membrane, external ear and ear canal normal.  Left Ear: Hearing, tympanic membrane, external ear and ear canal normal.  Nose: Nose normal.  Eyes: Conjunctivae normal, EOM and lids are normal. Pupils are equal, round, and reactive to light. No foreign bodies found.  Neck: Trachea normal and normal range of motion. Neck supple. Carotid bruit is not present. No mass and no thyromegaly present.  Cardiovascular: Normal rate, regular rhythm, S1 normal, S2 normal, normal heart sounds and intact distal pulses.  Exam reveals no gallop.   No murmur heard. Pulmonary/Chest: Effort normal and breath sounds normal. No respiratory distress. She has no wheezes. She has no rhonchi. She has no rales.  Abdominal: Soft. Normal appearance and bowel sounds are normal. She exhibits no distension, no fluid wave, no abdominal bruit and no mass. There is no hepatosplenomegaly. There is no tenderness. There is no rebound, no guarding and no CVA tenderness. No hernia.  Lymphadenopathy:    She has no cervical adenopathy.    She has no axillary adenopathy.  Neurological: She is alert. She has normal strength. No cranial nerve deficit or sensory deficit.  Skin: Skin is warm, dry and intact. No rash noted.  Psychiatric: Her speech is normal and behavior is normal. Judgment normal. Her mood appears not anxious. Cognition and memory are normal. She does not exhibit a depressed mood.          Assessment & Plan:

## 2012-08-12 NOTE — Assessment & Plan Note (Signed)
Moderately risky surgery in an low risk pt from a pulmonary and cardiac standpoint.  Will eval with labs to ensure no issues.

## 2012-08-12 NOTE — Assessment & Plan Note (Signed)
Well controlled. Continue current medication.  

## 2012-08-16 ENCOUNTER — Telehealth: Payer: Self-pay | Admitting: Family Medicine

## 2012-08-16 DIAGNOSIS — I10 Essential (primary) hypertension: Secondary | ICD-10-CM

## 2012-08-16 DIAGNOSIS — E785 Hyperlipidemia, unspecified: Secondary | ICD-10-CM

## 2012-08-16 NOTE — Telephone Encounter (Signed)
Message copied by Excell Seltzer on Tue Aug 16, 2012 11:43 PM ------      Message from: Alvina Chou      Created: Tue Aug 16, 2012  9:58 AM      Regarding: Wednesday labs       Fasting labs

## 2012-08-17 ENCOUNTER — Other Ambulatory Visit (INDEPENDENT_AMBULATORY_CARE_PROVIDER_SITE_OTHER): Payer: Medicare Other

## 2012-08-17 DIAGNOSIS — E785 Hyperlipidemia, unspecified: Secondary | ICD-10-CM

## 2012-08-17 DIAGNOSIS — I10 Essential (primary) hypertension: Secondary | ICD-10-CM

## 2012-08-17 LAB — COMPREHENSIVE METABOLIC PANEL
Albumin: 3.9 g/dL (ref 3.5–5.2)
Alkaline Phosphatase: 77 U/L (ref 39–117)
BUN: 15 mg/dL (ref 6–23)
Calcium: 9 mg/dL (ref 8.4–10.5)
Chloride: 104 mEq/L (ref 96–112)
GFR: 76.86 mL/min (ref >60.00)
Glucose, Bld: 99 mg/dL (ref 70–99)
Potassium: 4 mEq/L (ref 3.5–5.1)

## 2012-08-17 LAB — LIPID PANEL
Cholesterol: 214 mg/dL — ABNORMAL HIGH (ref 0–200)
Triglycerides: 135 mg/dL (ref 0.0–149.0)

## 2012-08-18 ENCOUNTER — Other Ambulatory Visit: Payer: Medicare Other

## 2012-08-22 ENCOUNTER — Encounter: Payer: Self-pay | Admitting: *Deleted

## 2012-08-23 ENCOUNTER — Telehealth: Payer: Self-pay | Admitting: *Deleted

## 2012-08-23 NOTE — Telephone Encounter (Signed)
Received request for cardiac clearance from [facility], signed and faxed back per MR.

## 2012-09-08 NOTE — Progress Notes (Signed)
Patient is scheduled for surgery on 10/11/12.  Preop appointment is 10/03/12 at 1430.  Need orders in EPIC.  Thank You.

## 2012-09-12 ENCOUNTER — Telehealth: Payer: Self-pay | Admitting: Cardiovascular Disease

## 2012-09-12 NOTE — Telephone Encounter (Signed)
F/u  ° °Pt returning nurse call, she can be reached at hm#  °

## 2012-09-12 NOTE — Telephone Encounter (Signed)
Pt calling re her diagnosis

## 2012-09-12 NOTE — Telephone Encounter (Signed)
PSVT/ pt needed to know what that meant. Questions answered. Pt agreed to DX.

## 2012-09-12 NOTE — Telephone Encounter (Signed)
LMTCB

## 2012-09-20 ENCOUNTER — Encounter (HOSPITAL_COMMUNITY): Payer: Self-pay | Admitting: Pharmacy Technician

## 2012-10-03 ENCOUNTER — Ambulatory Visit (HOSPITAL_COMMUNITY)
Admission: RE | Admit: 2012-10-03 | Discharge: 2012-10-03 | Disposition: A | Discharge status: Home / Self Care | Payer: Medicare Other | Source: Ambulatory Visit | Attending: Orthopedic Surgery | Admitting: Orthopedic Surgery

## 2012-10-03 ENCOUNTER — Encounter (HOSPITAL_COMMUNITY): Payer: Self-pay

## 2012-10-03 ENCOUNTER — Encounter (HOSPITAL_COMMUNITY)
Admission: RE | Admit: 2012-10-03 | Discharge: 2012-10-03 | Disposition: A | Discharge status: Home / Self Care | Payer: Medicare Other | Source: Ambulatory Visit | Attending: Orthopedic Surgery | Admitting: Orthopedic Surgery

## 2012-10-03 DIAGNOSIS — Z87891 Personal history of nicotine dependence: Secondary | ICD-10-CM | POA: Insufficient documentation

## 2012-10-03 DIAGNOSIS — Z01818 Encounter for other preprocedural examination: Secondary | ICD-10-CM | POA: Insufficient documentation

## 2012-10-03 DIAGNOSIS — I10 Essential (primary) hypertension: Secondary | ICD-10-CM | POA: Insufficient documentation

## 2012-10-03 HISTORY — DX: Phlebitis and thrombophlebitis of superficial vessels of right lower extremity: I80.01

## 2012-10-03 HISTORY — DX: Cardiac arrhythmia, unspecified: I49.9

## 2012-10-03 HISTORY — DX: Migraine, unspecified, not intractable, without status migrainosus: G43.909

## 2012-10-03 LAB — URINALYSIS, ROUTINE W REFLEX MICROSCOPIC
Glucose, UA: NEGATIVE mg/dL
Ketones, ur: NEGATIVE mg/dL
Leukocytes, UA: NEGATIVE
Nitrite: NEGATIVE
Protein, ur: NEGATIVE mg/dL
Urobilinogen, UA: 0.2 mg/dL (ref 0.0–1.0)

## 2012-10-03 LAB — CBC
MCV: 92.6 fL (ref 78.0–100.0)
Platelets: 247 10*3/uL (ref 150–400)
RBC: 4.33 MIL/uL (ref 3.87–5.11)
RDW: 14.3 % (ref 11.5–15.5)
WBC: 8.5 10*3/uL (ref 4.0–10.5)

## 2012-10-03 LAB — BASIC METABOLIC PANEL
CO2: 27 mEq/L (ref 19–32)
Calcium: 9.1 mg/dL (ref 8.4–10.5)
Chloride: 104 mEq/L (ref 96–112)
Creatinine, Ser: 0.67 mg/dL (ref 0.50–1.10)
GFR calc Af Amer: >90 mL/min (ref >90)
Sodium: 142 mEq/L (ref 135–145)

## 2012-10-03 LAB — SURGICAL PCR SCREEN: MRSA, PCR: NEGATIVE

## 2012-10-03 LAB — PROTIME-INR: INR: 0.91 (ref 0.00–1.49)

## 2012-10-03 LAB — APTT: aPTT: 31 seconds (ref 24–37)

## 2012-10-03 NOTE — Progress Notes (Signed)
LOV Dr Melburn Popper 10/13 EPIC, clearance note EPIC 11/13, last EKG 4/13 EPIC    Per patient history has had hx of shortness of breath from time to time and has been seen  By Dr Melburn Popper- hx PSVT- patient states no ablation done- states has taken one inderal tablet in 1 year- 09/28/12- states seems to be stress related.  Per Dr Okey Dupre, anesthesia-I instructed patient to take am of surgery, verbalized understanding

## 2012-10-03 NOTE — Patient Instructions (Addendum)
20 ALILAH MCMEANS  10/03/2012   Your procedure is scheduled on:  10/11/12  TUESDAY  Report to [facility] at   0800    AM.  Call this number if you have problems the morning of surgery: [phone]       Remember:   Do not eat food  Or drink :After Midnight.  Monday NIGHT   Take these medicines the morning of surgery with A SIP OF WATER:  INDERAL   .  Contacts, dentures or partial plates can not be worn to surgery  Leave suitcase in the car. After surgery it may be brought to your room.  For patients admitted to the hospital, checkout time is 11:00 AM day of  discharge.             SPECIAL INSTRUCTIONS- SEE [facility] PREPARING FOR SURGERY INSTRUCTION SHEET-     DO NOT WEAR JEWELRY, LOTIONS, POWDERS, OR PERFUMES.  WOMEN-- DO NOT SHAVE LEGS OR UNDERARMS FOR 12 HOURS BEFORE SHOWERS. MEN MAY SHAVE FACE.  Patients discharged the day of surgery will not be allowed to drive home. IF going home the day of surgery, you must have a driver and someone to stay with you for the first 24 hours  Name and phone number of your driver:  Daughters or husnand                                                                      Please read over the following fact sheets that you were given: MRSA Information, Incentive Spirometry Sheet, Blood Transfusion Sheet  Information                                                                                   Anastazja Isaac  PST 336  [id number]                 FAILURE TO FOLLOW THESE INSTRUCTIONS MAY RESULT IN  CANCELLATION   OF YOUR SURGERY                                                  Patient Signature _____________________________

## 2012-10-07 NOTE — H&P (Signed)
TOTAL KNEE ADMISSION H&P  Patient is being admitted for right total knee arthroplasty.  Subjective:  Chief Complaint:    Right knee OA / pain.  HPI: [name], 72 y.o. female, has a history of pain and functional disability in the right knee due to arthritis and has failed non-surgical conservative treatments for greater than 12 weeks to includeNSAID's and/or analgesics, corticosteriod injections and activity modification.  Onset of symptoms was gradual, starting 6 months ago with rapidlly worsening course since that time. The patient noted no past surgery on the right knee(s).  Patient currently rates pain in the right knee(s) at 8 out of 10 with activity. Patient has night pain, worsening of pain with activity and weight bearing, pain that interferes with activities of daily living, pain with passive range of motion and crepitus.  Patient has evidence of periarticular osteophytes and joint space narrowing by imaging studies. There is no active infection. Risks, benefits and expectations were discussed with the patient. Patient understand the risks, benefits and expectations and wishes to proceed with surgery.   D/C Plans:  Home with HHPT  Post-op Meds:   Rx given for ASA, Robaxin, Celebrex, Iron, Colace and MiraLax  Tranexamic Acid:  To be given  Decadron:   To be given   FYI:  Does state that she usually get nausea with pain medications, but always willing to try.  Patient Active Problem List   Diagnosis Date Noted  . Pre-operative examination 08/12/2012  . PSVT (paroxysmal supraventricular tachycardia) 02/15/2012  . Palpitation 01/12/2012  . ULNAR NEUROPATHY, RIGHT 11/02/2008  . PREDIABETES 01/10/2008  . HYPERLIPIDEMIA 01/04/2008  . HYPERTENSION 01/04/2008  . GERD 01/04/2008  . OSTEOARTHRITIS 01/04/2008  . LUMBAGO 01/04/2008  . PAP SMEAR, ABNORMAL 01/04/2008  . SUPERFICIAL THROMBOPHLEBITIS 10/05/1988   Past Medical History  Diagnosis Date  . Hyperlipidemia    statin intolerant  . Osteoarthritis   . GERD (gastroesophageal reflux disease)   . Obesity   . Dysrhythmia     PSVT/ palpitations-  controlled with prn Inderal  . Phlebitis of leg, right, superficial 1986/1989    x 2  . Migraines   . Hypertension     eccho 4/13 EPIC    Past Surgical History  Procedure Date  . Cardiac catheterization 1990s    negative  . Cardiovascular stress test 2008    NML  . Dilation and curettage, diagnostic / therapeutic 2008  . Total knee arthroplasty 2008    Left  . Rotator cuff repair 1999    Right, left in 2002  . US echocardiography 02/07/2007    EF 55-60%  . Cardiovascular stress test 02/02/2007    EF 70%, NO ISCHEMIA     Allergies  Allergen Reactions  . Codeine     REACTION: Nausea and vomiting    History  Substance Use Topics  . Smoking status: Former Smoker -- 1.0 packs/day for 5 years  . Smokeless tobacco: Never Used  . Alcohol Use: No     Comment: stopped 5 years ago    Family History  Problem Relation Age of Onset  . Heart attack Father 12    PUD  . Hypertension Father   . Coronary artery disease      Female 1st degree relative <50  . Lung cancer Brother   . Heart defect Sister   . Hypertension Mother      Review of Systems  Constitutional: Negative.   HENT: Negative.   Eyes: Negative.   Respiratory: Negative.  Cardiovascular:       Irregular heart beat / rate.  Gastrointestinal: Negative.   Genitourinary: Negative.   Musculoskeletal: Positive for myalgias, back pain and joint pain.  Skin: Negative.   Neurological: Negative.   Endo/Heme/Allergies: Negative.   Psychiatric/Behavioral: Negative.     Objective:  Physical Exam  Constitutional: She is oriented to person, place, and time. She appears well-developed and well-nourished.  HENT:  Head: Normocephalic and atraumatic.  Mouth/Throat: Oropharynx is clear and moist.  Cardiovascular: Normal rate and intact distal pulses.        Irregular heart beat / rate.    Respiratory: Effort normal and breath sounds normal. No respiratory distress. She has no wheezes.  GI: Soft. There is no tenderness. There is no guarding.  Musculoskeletal:       Right knee: She exhibits decreased range of motion, swelling and bony tenderness. She exhibits no effusion, no ecchymosis, no deformity and no laceration. tenderness found.  Neurological: She is alert and oriented to person, place, and time.  Skin: Skin is warm and dry.  Psychiatric: She has a normal mood and affect.    Labs:  Estimated Body mass index is 32.06 kg/(m^2) as calculated from the following:   Height as of 07/25/12: 5\' 4" (1.626 m).   Weight as of 08/12/12: 186 lb 12 oz(84.709 kg).   Imaging Review Plain radiographs demonstrate severe degenerative joint disease of the right knee(s). The overall alignment isneutral. The bone quality appears to be good for age and reported activity level.  Assessment/Plan:  End stage arthritis, right knee   The patient history, physical examination, clinical judgment of the provider and imaging studies are consistent with end stage degenerative joint disease of the right knee(s) and total knee arthroplasty is deemed medically necessary. The treatment options including medical management, injection therapy arthroscopy and arthroplasty were discussed at length. The risks and benefits of total knee arthroplasty were presented and reviewed. The risks due to aseptic loosening, infection, stiffness, patella tracking problems, thromboembolic complications and other imponderables were discussed. The patient acknowledged the explanation, agreed to proceed with the plan and consent was signed. Patient is being admitted for inpatient treatment for surgery, pain control, PT, OT, prophylactic antibiotics, VTE prophylaxis, progressive ambulation and ADL's and discharge planning. The patient is planning to be discharged home with home health services.     Anastasio Auerbach Lopez Dentinger    PAC  10/07/2012, 2:37 PM

## 2012-10-11 ENCOUNTER — Encounter (HOSPITAL_COMMUNITY): Payer: Self-pay | Admitting: Anesthesiology

## 2012-10-11 ENCOUNTER — Inpatient Hospital Stay (HOSPITAL_COMMUNITY)
Admission: RE | Admit: 2012-10-11 | Discharge: 2012-10-12 | DRG: 470 | Disposition: A | Discharge status: Home Health | Payer: Medicare Other | Source: Ambulatory Visit | Attending: Orthopedic Surgery | Admitting: Orthopedic Surgery

## 2012-10-11 ENCOUNTER — Encounter (HOSPITAL_COMMUNITY): Payer: Self-pay | Admitting: *Deleted

## 2012-10-11 ENCOUNTER — Inpatient Hospital Stay (HOSPITAL_COMMUNITY): Payer: Medicare Other | Admitting: Anesthesiology

## 2012-10-11 ENCOUNTER — Encounter (HOSPITAL_COMMUNITY)
Admission: RE | Disposition: A | Discharge status: Home Health | Payer: Self-pay | Source: Ambulatory Visit | Attending: Orthopedic Surgery

## 2012-10-11 DIAGNOSIS — E785 Hyperlipidemia, unspecified: Secondary | ICD-10-CM | POA: Diagnosis present

## 2012-10-11 DIAGNOSIS — D62 Acute posthemorrhagic anemia: Secondary | ICD-10-CM | POA: Diagnosis not present

## 2012-10-11 DIAGNOSIS — M898X9 Other specified disorders of bone, unspecified site: Secondary | ICD-10-CM | POA: Diagnosis present

## 2012-10-11 DIAGNOSIS — I498 Other specified cardiac arrhythmias: Secondary | ICD-10-CM | POA: Diagnosis present

## 2012-10-11 DIAGNOSIS — I10 Essential (primary) hypertension: Secondary | ICD-10-CM | POA: Diagnosis present

## 2012-10-11 DIAGNOSIS — D500 Iron deficiency anemia secondary to blood loss (chronic): Secondary | ICD-10-CM

## 2012-10-11 DIAGNOSIS — E669 Obesity, unspecified: Secondary | ICD-10-CM | POA: Diagnosis present

## 2012-10-11 DIAGNOSIS — M1710 Unilateral primary osteoarthritis, unspecified knee: Principal | ICD-10-CM | POA: Diagnosis present

## 2012-10-11 DIAGNOSIS — Z96659 Presence of unspecified artificial knee joint: Secondary | ICD-10-CM

## 2012-10-11 DIAGNOSIS — K219 Gastro-esophageal reflux disease without esophagitis: Secondary | ICD-10-CM | POA: Diagnosis present

## 2012-10-11 DIAGNOSIS — Z6835 Body mass index (BMI) 35.0-35.9, adult: Secondary | ICD-10-CM

## 2012-10-11 HISTORY — PX: TOTAL KNEE ARTHROPLASTY: SHX125

## 2012-10-11 SURGERY — ARTHROPLASTY, KNEE, TOTAL
Anesthesia: Spinal | Site: Knee | Laterality: Right | Wound class: Clean

## 2012-10-11 MED ORDER — HYDROMORPHONE HCL PF 1 MG/ML IJ SOLN: 0.2500 mg | INTRAMUSCULAR | Status: DC | PRN

## 2012-10-11 MED ORDER — CEFAZOLIN SODIUM-DEXTROSE 2-3 GM-% IV SOLR
2.0000 g | INTRAVENOUS | Status: AC
  Administered 2012-10-11: 2 g via INTRAVENOUS

## 2012-10-11 MED ORDER — METHOCARBAMOL 100 MG/ML IJ SOLN
500.0000 mg | Freq: Four times a day (QID) | INTRAVENOUS | Status: DC | PRN
  Administered 2012-10-11: 500 mg via INTRAVENOUS
  Filled 2012-10-11 (×2): qty 5

## 2012-10-11 MED ORDER — ENALAPRIL MALEATE 10 MG PO TABS
10.0000 mg | ORAL_TABLET | Freq: Every day | ORAL | Status: DC
  Filled 2012-10-11: qty 1

## 2012-10-11 MED ORDER — PROPRANOLOL HCL 10 MG PO TABS
10.0000 mg | ORAL_TABLET | Freq: Four times a day (QID) | ORAL | Status: DC | PRN
  Filled 2012-10-11: qty 1

## 2012-10-11 MED ORDER — CEFAZOLIN SODIUM-DEXTROSE 2-3 GM-% IV SOLR
2.0000 g | Freq: Four times a day (QID) | INTRAVENOUS | Status: AC
  Administered 2012-10-11 (×2): 2 g via INTRAVENOUS
  Filled 2012-10-11 (×2): qty 50

## 2012-10-11 MED ORDER — RIVAROXABAN 10 MG PO TABS
10.0000 mg | ORAL_TABLET | Freq: Every day | ORAL | Status: DC
  Administered 2012-10-12: 10 mg via ORAL
  Filled 2012-10-11 (×2): qty 1

## 2012-10-11 MED ORDER — LACTATED RINGERS IV SOLN: INTRAVENOUS | Status: DC

## 2012-10-11 MED ORDER — DEXAMETHASONE SODIUM PHOSPHATE 10 MG/ML IJ SOLN
10.0000 mg | Freq: Once | INTRAMUSCULAR | Status: AC
  Administered 2012-10-11: 10 mg via INTRAVENOUS

## 2012-10-11 MED ORDER — TRANEXAMIC ACID 100 MG/ML IV SOLN
1400.0000 mg | Freq: Once | INTRAVENOUS | Status: AC
  Administered 2012-10-11: 1401 mg via INTRAVENOUS
  Filled 2012-10-11: qty 14

## 2012-10-11 MED ORDER — ONDANSETRON HCL 4 MG/2ML IJ SOLN
4.0000 mg | Freq: Four times a day (QID) | INTRAMUSCULAR | Status: DC | PRN
  Administered 2012-10-12: 4 mg via INTRAVENOUS
  Filled 2012-10-11: qty 2

## 2012-10-11 MED ORDER — PROMETHAZINE HCL 25 MG/ML IJ SOLN: 6.2500 mg | INTRAMUSCULAR | Status: DC | PRN

## 2012-10-11 MED ORDER — TEMAZEPAM 15 MG PO CAPS: 15.0000 mg | ORAL_CAPSULE | Freq: Every evening | ORAL | Status: DC | PRN

## 2012-10-11 MED ORDER — GLYCOPYRROLATE 0.2 MG/ML IJ SOLN
INTRAMUSCULAR | Status: DC | PRN
  Administered 2012-10-11: 0.2 mg via INTRAVENOUS

## 2012-10-11 MED ORDER — ENALAPRIL-HYDROCHLOROTHIAZIDE 10-25 MG PO TABS: 0.5000 | ORAL_TABLET | Freq: Every day | ORAL | Status: DC

## 2012-10-11 MED ORDER — HYDROCHLOROTHIAZIDE 25 MG PO TABS
25.0000 mg | ORAL_TABLET | Freq: Every day | ORAL | Status: DC
  Filled 2012-10-11: qty 1

## 2012-10-11 MED ORDER — ALUM & MAG HYDROXIDE-SIMETH 200-200-20 MG/5ML PO SUSP: 30.0000 mL | ORAL | Status: DC | PRN

## 2012-10-11 MED ORDER — ONDANSETRON HCL 4 MG PO TABS
4.0000 mg | ORAL_TABLET | Freq: Four times a day (QID) | ORAL | Status: DC | PRN
  Administered 2012-10-11: 4 mg via ORAL
  Filled 2012-10-11: qty 1

## 2012-10-11 MED ORDER — MENTHOL 3 MG MT LOZG
1.0000 | LOZENGE | OROMUCOSAL | Status: DC | PRN
  Filled 2012-10-11: qty 9

## 2012-10-11 MED ORDER — KETOROLAC TROMETHAMINE 30 MG/ML IJ SOLN
INTRAMUSCULAR | Status: DC | PRN
  Administered 2012-10-11: 30 mg

## 2012-10-11 MED ORDER — FENTANYL CITRATE 0.05 MG/ML IJ SOLN
INTRAMUSCULAR | Status: DC | PRN
  Administered 2012-10-11: 50 ug via INTRAVENOUS

## 2012-10-11 MED ORDER — DEXAMETHASONE SODIUM PHOSPHATE 10 MG/ML IJ SOLN
10.0000 mg | Freq: Once | INTRAMUSCULAR | Status: AC
  Administered 2012-10-12: 10 mg via INTRAVENOUS
  Filled 2012-10-11: qty 1

## 2012-10-11 MED ORDER — BUPIVACAINE HCL (PF) 0.75 % IJ SOLN
INTRAMUSCULAR | Status: DC | PRN
  Administered 2012-10-11: 2 mL

## 2012-10-11 MED ORDER — METHOCARBAMOL 500 MG PO TABS
500.0000 mg | ORAL_TABLET | Freq: Four times a day (QID) | ORAL | Status: DC | PRN
  Administered 2012-10-11: 500 mg via ORAL
  Filled 2012-10-11: qty 1

## 2012-10-11 MED ORDER — SODIUM CHLORIDE 0.9 % IV SOLN
INTRAVENOUS | Status: DC
  Administered 2012-10-11 – 2012-10-12 (×2): via INTRAVENOUS
  Filled 2012-10-11 (×5): qty 1000

## 2012-10-11 MED ORDER — ACETAMINOPHEN 10 MG/ML IV SOLN: 1000.0000 mg | Freq: Once | INTRAVENOUS | Status: DC | PRN

## 2012-10-11 MED ORDER — SODIUM CHLORIDE 0.9 % IR SOLN
Status: DC | PRN
  Administered 2012-10-11: 1000 mL

## 2012-10-11 MED ORDER — 0.9 % SODIUM CHLORIDE (POUR BTL) OPTIME
TOPICAL | Status: DC | PRN
  Administered 2012-10-11: 1000 mL

## 2012-10-11 MED ORDER — HYDROCODONE-ACETAMINOPHEN 7.5-325 MG PO TABS
1.0000 | ORAL_TABLET | ORAL | Status: DC
  Administered 2012-10-11: 2 via ORAL
  Administered 2012-10-11: 1 via ORAL
  Administered 2012-10-12: 2 via ORAL
  Administered 2012-10-12 (×3): 1 via ORAL
  Filled 2012-10-11: qty 1
  Filled 2012-10-11: qty 2
  Filled 2012-10-11: qty 1
  Filled 2012-10-11: qty 2
  Filled 2012-10-11 (×2): qty 1

## 2012-10-11 MED ORDER — LACTATED RINGERS IV SOLN
INTRAVENOUS | Status: DC | PRN
  Administered 2012-10-11 (×2): via INTRAVENOUS

## 2012-10-11 MED ORDER — PROPOFOL INFUSION 10 MG/ML OPTIME
INTRAVENOUS | Status: DC | PRN
  Administered 2012-10-11: 75 ug/kg/min via INTRAVENOUS

## 2012-10-11 MED ORDER — PHENYLEPHRINE HCL 10 MG/ML IJ SOLN
10.0000 mg | INTRAVENOUS | Status: DC | PRN
  Administered 2012-10-11: 50 ug/min via INTRAVENOUS

## 2012-10-11 MED ORDER — DIPHENHYDRAMINE HCL 12.5 MG/5ML PO ELIX
25.0000 mg | ORAL_SOLUTION | Freq: Four times a day (QID) | ORAL | Status: DC | PRN
  Administered 2012-10-12: 12.5 mg via ORAL
  Filled 2012-10-11: qty 5

## 2012-10-11 MED ORDER — POLYETHYLENE GLYCOL 3350 17 G PO PACK: 17.0000 g | PACK | Freq: Every day | ORAL | Status: DC | PRN

## 2012-10-11 MED ORDER — HYDROMORPHONE HCL PF 1 MG/ML IJ SOLN
0.2000 mg | INTRAMUSCULAR | Status: DC | PRN
  Administered 2012-10-11: 0.5 mg via INTRAVENOUS
  Filled 2012-10-11: qty 1

## 2012-10-11 MED ORDER — ACETAMINOPHEN 10 MG/ML IV SOLN
INTRAVENOUS | Status: DC | PRN
  Administered 2012-10-11: 1000 mg via INTRAVENOUS

## 2012-10-11 MED ORDER — FERROUS SULFATE 325 (65 FE) MG PO TABS
325.0000 mg | ORAL_TABLET | Freq: Three times a day (TID) | ORAL | Status: DC
  Administered 2012-10-12: 325 mg via ORAL
  Filled 2012-10-11 (×5): qty 1

## 2012-10-11 MED ORDER — HYDROMORPHONE HCL PF 1 MG/ML IJ SOLN
0.5000 mg | INTRAMUSCULAR | Status: DC | PRN
  Administered 2012-10-11 (×2): 1 mg via INTRAVENOUS
  Filled 2012-10-11 (×2): qty 1

## 2012-10-11 MED ORDER — MIDAZOLAM HCL 5 MG/5ML IJ SOLN
INTRAMUSCULAR | Status: DC | PRN
  Administered 2012-10-11: 2 mg via INTRAVENOUS

## 2012-10-11 MED ORDER — PHENOL 1.4 % MT LIQD
1.0000 | OROMUCOSAL | Status: DC | PRN
  Filled 2012-10-11: qty 177

## 2012-10-11 MED ORDER — BUPIVACAINE-EPINEPHRINE PF 0.25-1:200000 % IJ SOLN
INTRAMUSCULAR | Status: DC | PRN
  Administered 2012-10-11: 50 mL

## 2012-10-11 MED ORDER — DOCUSATE SODIUM 100 MG PO CAPS
100.0000 mg | ORAL_CAPSULE | Freq: Two times a day (BID) | ORAL | Status: DC
  Administered 2012-10-11: 100 mg via ORAL

## 2012-10-11 MED ORDER — MEPERIDINE HCL 50 MG/ML IJ SOLN: 6.2500 mg | INTRAMUSCULAR | Status: DC | PRN

## 2012-10-11 MED ORDER — SENNA 8.6 MG PO TABS
1.0000 | ORAL_TABLET | Freq: Two times a day (BID) | ORAL | Status: DC
  Administered 2012-10-11: 8.6 mg via ORAL
  Filled 2012-10-11: qty 1

## 2012-10-11 SURGICAL SUPPLY — 58 items
ADH SKN CLS APL DERMABOND .7 (GAUZE/BANDAGES/DRESSINGS) ×1
BAG SPEC THK2 15X12 ZIP CLS (MISCELLANEOUS) ×1
BAG ZIPLOCK 12X15 (MISCELLANEOUS) ×2 IMPLANT
BANDAGE ELASTIC 6 VELCRO ST LF (GAUZE/BANDAGES/DRESSINGS) ×2 IMPLANT
BANDAGE ESMARK 6X9 LF (GAUZE/BANDAGES/DRESSINGS) ×1 IMPLANT
BLADE SAW SGTL 13.0X1.19X90.0M (BLADE) ×2 IMPLANT
BNDG CMPR 9X6 STRL LF SNTH (GAUZE/BANDAGES/DRESSINGS) ×1
BNDG ESMARK 6X9 LF (GAUZE/BANDAGES/DRESSINGS) ×2
BOWL SMART MIX CTS (DISPOSABLE) ×2 IMPLANT
CEMENT HV SMART SET (Cement) ×2 IMPLANT
CLOTH BEACON ORANGE TIMEOUT ST (SAFETY) ×2 IMPLANT
CUFF TOURN SGL QUICK 34 (TOURNIQUET CUFF) ×2
CUFF TRNQT CYL 34X4X40X1 (TOURNIQUET CUFF) ×1 IMPLANT
DECANTER SPIKE VIAL GLASS SM (MISCELLANEOUS) ×2 IMPLANT
DERMABOND ADVANCED (GAUZE/BANDAGES/DRESSINGS) ×1
DERMABOND ADVANCED .7 DNX12 (GAUZE/BANDAGES/DRESSINGS) ×1 IMPLANT
DRAPE EXTREMITY T 121X128X90 (DRAPE) ×2 IMPLANT
DRAPE POUCH INSTRU U-SHP 10X18 (DRAPES) ×2 IMPLANT
DRAPE U-SHAPE 47X51 STRL (DRAPES) ×2 IMPLANT
DRSG AQUACEL AG ADV 3.5X10 (GAUZE/BANDAGES/DRESSINGS) ×2 IMPLANT
DRSG TEGADERM 4X4.75 (GAUZE/BANDAGES/DRESSINGS) ×2 IMPLANT
DURAPREP 26ML APPLICATOR (WOUND CARE) ×2 IMPLANT
ELECT REM PT RETURN 9FT ADLT (ELECTROSURGICAL) ×2
ELECTRODE REM PT RTRN 9FT ADLT (ELECTROSURGICAL) ×1 IMPLANT
EVACUATOR 1/8 PVC DRAIN (DRAIN) ×2 IMPLANT
FACESHIELD LNG OPTICON STERILE (SAFETY) ×10 IMPLANT
GAUZE SPONGE 2X2 8PLY STRL LF (GAUZE/BANDAGES/DRESSINGS) ×1 IMPLANT
GLOVE BIOGEL PI IND STRL 7.5 (GLOVE) ×1 IMPLANT
GLOVE BIOGEL PI IND STRL 8 (GLOVE) ×1 IMPLANT
GLOVE BIOGEL PI INDICATOR 7.5 (GLOVE) ×1
GLOVE BIOGEL PI INDICATOR 8 (GLOVE) ×1
GLOVE ECLIPSE 8.0 STRL XLNG CF (GLOVE) ×2 IMPLANT
GLOVE ORTHO TXT STRL SZ7.5 (GLOVE) ×4 IMPLANT
GOWN BRE IMP PREV XXLGXLNG (GOWN DISPOSABLE) ×4 IMPLANT
GOWN STRL NON-REIN LRG LVL3 (GOWN DISPOSABLE) ×3 IMPLANT
HANDPIECE INTERPULSE COAX TIP (DISPOSABLE) ×2
IMMOBILIZER KNEE 20 (SOFTGOODS) ×2
IMMOBILIZER KNEE 20 THIGH 36 (SOFTGOODS) IMPLANT
KIT BASIN OR (CUSTOM PROCEDURE TRAY) ×2 IMPLANT
MANIFOLD NEPTUNE II (INSTRUMENTS) ×2 IMPLANT
NDL SAFETY ECLIPSE 18X1.5 (NEEDLE) ×1 IMPLANT
NEEDLE HYPO 18GX1.5 SHARP (NEEDLE) ×2
NS IRRIG 1000ML POUR BTL (IV SOLUTION) ×4 IMPLANT
PACK TOTAL JOINT (CUSTOM PROCEDURE TRAY) ×2 IMPLANT
POSITIONER SURGICAL ARM (MISCELLANEOUS) ×2 IMPLANT
SET HNDPC FAN SPRY TIP SCT (DISPOSABLE) ×1 IMPLANT
SET PAD KNEE POSITIONER (MISCELLANEOUS) ×2 IMPLANT
SPONGE GAUZE 2X2 STER 10/PKG (GAUZE/BANDAGES/DRESSINGS) ×1
SUCTION FRAZIER 12FR DISP (SUCTIONS) ×2 IMPLANT
SUT MNCRL AB 4-0 PS2 18 (SUTURE) ×2 IMPLANT
SUT VIC AB 1 CT1 36 (SUTURE) ×4 IMPLANT
SUT VIC AB 2-0 CT1 27 (SUTURE) ×6
SUT VIC AB 2-0 CT1 TAPERPNT 27 (SUTURE) ×3 IMPLANT
SYR 50ML LL SCALE MARK (SYRINGE) ×2 IMPLANT
TOWEL OR 17X26 10 PK STRL BLUE (TOWEL DISPOSABLE) ×4 IMPLANT
TRAY FOLEY CATH 14FRSI W/METER (CATHETERS) ×2 IMPLANT
WATER STERILE IRR 1500ML POUR (IV SOLUTION) ×2 IMPLANT
WRAP KNEE MAXI GEL POST OP (GAUZE/BANDAGES/DRESSINGS) ×2 IMPLANT

## 2012-10-11 NOTE — Transfer of Care (Signed)
Immediate Anesthesia Transfer of Care Note  Patient: [name]  Procedure(s) Performed: Procedure(s) (LRB) with comments: TOTAL KNEE ARTHROPLASTY (Right)  Patient Location: PACU  Anesthesia Type:MAC and Spinal  Level of Consciousness: awake and alert   Airway & Oxygen Therapy: Patient Spontanous Breathing and Patient connected to face mask oxygen  Post-op Assessment: Report given to PACU RN and Post -op Vital signs reviewed and stable  Post vital signs: Reviewed and stable  Complications: No apparent anesthesia complications

## 2012-10-11 NOTE — Plan of Care (Signed)
Problem: Consults Goal: Diagnosis- Total Joint Replacement Outcome: Adequate for Discharge Right total knee

## 2012-10-11 NOTE — Anesthesia Postprocedure Evaluation (Signed)
Anesthesia Post Note  Patient: [name]  Procedure(s) Performed: Procedure(s) (LRB): TOTAL KNEE ARTHROPLASTY (Right)  Anesthesia type: General  Patient location: PACU  Post pain: Pain level controlled  Post assessment: Post-op Vital signs reviewed  Last Vitals: BP 119/71  Pulse 57  Temp 36.4 C (Oral)  Resp 15  Wt 206 lb (93.441 kg)  SpO2 100%  Post vital signs: Reviewed  Level of consciousness: sedated  Complications: No apparent anesthesia complications

## 2012-10-11 NOTE — Progress Notes (Signed)
Utilization review completed.  

## 2012-10-11 NOTE — Anesthesia Procedure Notes (Signed)
Spinal  Patient location during procedure: OR Start time: 10/11/2012 10:07 AM Staffing Anesthesiologist: Gaylan Gerold CRNA/Resident: [place], Kairee Isa C Performed by: resident/CRNA  Preanesthetic Checklist Completed: patient identified, site marked, surgical consent, pre-op evaluation, timeout performed, IV checked, risks and benefits discussed and monitors and equipment checked Spinal Block Patient position: sitting Prep: Betadine and site prepped and draped Patient monitoring: heart rate, cardiac monitor, continuous pulse ox and blood pressure Approach: midline Location: L3-4 Injection technique: single-shot Needle Needle gauge: 22 G Assessment Sensory level: T6

## 2012-10-11 NOTE — Op Note (Signed)
NAME:  SARAYU PREVOST                      MEDICAL RECORD NO.:  [id number]                             FACILITY:  [facility]      PHYSICIAN:  Madlyn Frankel. Charlann Boxer, M.D.  DATE OF BIRTH:  02-08-1941      DATE OF PROCEDURE:  10/11/2012                                     OPERATIVE REPORT         PREOPERATIVE DIAGNOSIS:  Right knee osteoarthritis.      POSTOPERATIVE DIAGNOSIS:  Right knee osteoarthritis.      FINDINGS:  The patient was noted to have complete loss of cartilage and   bone-on-bone arthritis with associated osteophytes in all three compartments of   the knee with a significant synovitis and associated effusion.      PROCEDURE:  Right total knee replacement.      COMPONENTS USED:  DePuy rotating platform posterior stabilized knee   system, a size 3 femur, 2.5 tibia, 10 mm PS insert to match the 3 femur, and 38 patellar   button.      SURGEON:  Madlyn Frankel. Charlann Boxer, M.D.      ASSISTANT:  Leilani Able, PA-C.      ANESTHESIA:  Spinal.      SPECIMENS:  None.      COMPLICATION:  None.      DRAINS:  One Hemovac.  EBL: <150cc      TOURNIQUET TIME:   Total Tourniquet Time Documented: Thigh (Right) - 39 minutes .      The patient was stable to the recovery room.      INDICATION FOR PROCEDURE:  PORSHA SKILTON is a 72 y.o. female patient of   mine.  The patient had been seen, evaluated, and treated conservatively in the   office with medication, activity modification, and injections.  The patient had   radiographic changes of bone-on-bone arthritis with endplate sclerosis and osteophytes noted.      The patient failed conservative measures including medication, injections, and activity modification, and at this point was ready for more definitive measures.   Based on the radiographic changes and failed conservative measures, the patient   decided to proceed with total knee replacement.  Risks of infection,   DVT, component failure, need for revision surgery, postop  course, and   expectations were all   discussed and reviewed.  Consent was obtained for benefit of pain   relief.      PROCEDURE IN DETAIL:  The patient was brought to the operative theater.   Once adequate anesthesia, preoperative antibiotics, 2 gm of Ancef administered, the patient was positioned supine with the right thigh tourniquet placed.  The  right lower extremity was prepped and draped in sterile fashion.  A time-   out was performed identifying the patient, planned procedure, and   extremity.      The right lower extremity was placed in the [facility] leg holder.  The leg was   exsanguinated, tourniquet elevated to 250 mmHg.  A midline incision was   made followed by median parapatellar arthrotomy.  Following initial   exposure, attention was first directed to the  patella.  Precut   measurement was noted to be 22 mm.  I resected down to 13-14 mm and used a   38 patellar button to restore patellar height as well as cover the cut   surface.      The lug holes were drilled and a metal shim was placed to protect the   patella from retractors and saw blades.      At this point, attention was now directed to the femur.  The femoral   canal was opened with a drill, irrigated to try to prevent fat emboli.  An   intramedullary rod was passed at 3 degrees valgus, 10 mm of bone was   resected off the distal femur.  Following this resection, the tibia was   subluxated anteriorly.  Using the extramedullary guide, 10 mm of bone was resected off   the proximal lateral tibia.  We confirmed the gap would be   stable medially and laterally with a 10 mm insert as well as confirmed   the cut was perpendicular in the coronal plane, checking with an alignment rod.      Once this was done, I sized the femur to be a size 3 in the anterior-   posterior dimension, chose a standard component based on medial and   lateral dimension.  The size 3 rotation block was then pinned in   position anterior  referenced using the C-clamp to set rotation.  The   anterior, posterior, and  chamfer cuts were made without difficulty nor   notching making certain that I was along the anterior cortex to help   with flexion gap stability.      The final box cut was made off the lateral aspect of distal femur.      At this point, the tibia was sized to be a size 2.5, the size 2.5 tray was   then pinned in position through the medial third of the tubercle,   drilled, and keel punched.  Trial reduction was now carried with a 3 femur,  2.5 tibia, a 10 mm insert, and the 38 patella botton.  The knee was brought to   extension, full extension with good flexion stability with the patella   tracking through the trochlea without application of pressure.  Given   all these findings, the trial components removed.  Final components were   opened and cement was mixed.  The knee was irrigated with normal saline   solution and pulse lavage.  The synovial lining was   then injected with 0.25% Marcaine with epinephrine and 1 cc of Toradol,   total of 61 cc.      The knee was irrigated.  Final implants were then cemented onto clean and   dried cut surfaces of bone with the knee brought to extension with a 10 mm trial insert.      Once the cement had fully cured, the excess cement was removed   throughout the knee.  I confirmed I was satisfied with the range of   motion and stability, and the final 10 mm PS insert was chosen.  It was   placed into the knee.      The tourniquet had been let down at 38 minutes.  No significant   hemostasis required.  The medium Hemovac drain was placed deep.  The   extensor mechanism was then reapproximated using #1 Vicryl with the knee   in flexion.  The   remaining wound was  closed with 2-0 Vicryl and running 4-0 Monocryl.   The knee was cleaned, dried, dressed sterilely using Dermabond and   Aquacel dressing.  Drain site dressed separately.  The patient was then   brought to  recovery room in stable condition, tolerating the procedure   well.   Please note that Physician Assistant, Leilani Able, was present for the entirety of the case, and was utilized for pre-operative positioning, peri-operative retractor management, general facilitation of the procedure.  He was also utilized for primary wound closure at the end of the case.              Madlyn Frankel Charlann Boxer, M.D.

## 2012-10-11 NOTE — Interval H&P Note (Signed)
History and Physical Interval Note:  10/11/2012 9:00 AM  [name]  has presented today for surgery, with the diagnosis of RIGHT KNEE OSTEOARTHRITIS  The various methods of treatment have been discussed with the patient and family. After consideration of risks, benefits and other options for treatment, the patient has consented to  Procedure(s) (LRB) with comments: TOTAL KNEE ARTHROPLASTY (Right) as a surgical intervention .  The patient's history has been reviewed, patient examined, no change in status, stable for surgery.  I have reviewed the patient's chart and labs.  Questions were answered to the patient's satisfaction.     Shelda Pal

## 2012-10-11 NOTE — Anesthesia Preprocedure Evaluation (Addendum)
Anesthesia Evaluation  Patient identified by MRN, date of birth, ID band Patient awake    Reviewed: Allergy & Precautions, H&P , NPO status , Patient's Chart, lab work & pertinent test results  Airway Mallampati: II TM Distance: >3 FB Neck ROM: Full    Dental  (+) Dental Advisory Given   Pulmonary former smoker,  breath sounds clear to auscultation  Pulmonary exam normal       Cardiovascular hypertension, Pt. on medications + Peripheral Vascular Disease + dysrhythmias Supra Ventricular Tachycardia Rhythm:Regular Rate:Normal  Echo 4/13  - Left ventricle: The cavity size was normal. Systolic   function was normal. Wall motion was normal; there were no regional wall motion abnormalities. - Atrial septum: No defect or patent foramen ovale was   identified.    Neuro/Psych  Headaches,  Neuromuscular disease negative psych ROS   GI/Hepatic Neg liver ROS, GERD-  Medicated,  Endo/Other  negative endocrine ROS  Renal/GU negative Renal ROS     Musculoskeletal  (+) Arthritis -, Osteoarthritis,    Abdominal (+) + obese,   Peds  Hematology negative hematology ROS (+)   Anesthesia Other Findings   Reproductive/Obstetrics                        Anesthesia Physical Anesthesia Plan  ASA: II  Anesthesia Plan: Spinal   Post-op Pain Management:    Induction: Intravenous  Airway Management Planned: Simple Face Mask  Additional Equipment:   Intra-op Plan:   Post-operative Plan:   Informed Consent: I have reviewed the patients History and Physical, chart, labs and discussed the procedure including the risks, benefits and alternatives for the proposed anesthesia with the patient or authorized representative who has indicated his/her understanding and acceptance.   Dental advisory given  Plan Discussed with: CRNA  Anesthesia Plan Comments:        Anesthesia Quick Evaluation

## 2012-10-12 DIAGNOSIS — Z96659 Presence of unspecified artificial knee joint: Secondary | ICD-10-CM

## 2012-10-12 DIAGNOSIS — D500 Iron deficiency anemia secondary to blood loss (chronic): Secondary | ICD-10-CM

## 2012-10-12 LAB — BASIC METABOLIC PANEL
BUN: 13 mg/dL (ref 6–23)
Calcium: 8.4 mg/dL (ref 8.4–10.5)
GFR calc non Af Amer: 86 mL/min — ABNORMAL LOW (ref >90)
Glucose, Bld: 144 mg/dL — ABNORMAL HIGH (ref 70–99)
Sodium: 136 mEq/L (ref 135–145)

## 2012-10-12 LAB — CBC
Hemoglobin: 11.6 g/dL — ABNORMAL LOW (ref 12.0–15.0)
MCH: 31.3 pg (ref 26.0–34.0)
MCHC: 33.9 g/dL (ref 30.0–36.0)
RDW: 13.9 % (ref 11.5–15.5)

## 2012-10-12 MED ORDER — MELOXICAM 7.5 MG PO TABS: 7.5000 mg | ORAL_TABLET | Freq: Two times a day (BID) | ORAL | Status: DC | PRN

## 2012-10-12 MED ORDER — DIPHENHYDRAMINE HCL 12.5 MG/5ML PO ELIX: 25.0000 mg | ORAL_SOLUTION | Freq: Four times a day (QID) | ORAL | Status: DC | PRN

## 2012-10-12 MED ORDER — HYDROCODONE-ACETAMINOPHEN 7.5-325 MG PO TABS: 1.0000 | ORAL_TABLET | ORAL | Status: DC | PRN

## 2012-10-12 MED ORDER — POLYETHYLENE GLYCOL 3350 17 G PO PACK: 17.0000 g | PACK | Freq: Every day | ORAL | Status: DC | PRN

## 2012-10-12 MED ORDER — METHOCARBAMOL 500 MG PO TABS: 500.0000 mg | ORAL_TABLET | Freq: Four times a day (QID) | ORAL | Status: DC | PRN

## 2012-10-12 MED ORDER — FERROUS SULFATE 325 (65 FE) MG PO TABS: 325.0000 mg | ORAL_TABLET | Freq: Three times a day (TID) | ORAL | Status: DC

## 2012-10-12 MED ORDER — ASPIRIN EC 325 MG PO TBEC: 325.0000 mg | DELAYED_RELEASE_TABLET | Freq: Two times a day (BID) | ORAL | Status: DC

## 2012-10-12 MED ORDER — DSS 100 MG PO CAPS: 100.0000 mg | ORAL_CAPSULE | Freq: Two times a day (BID) | ORAL | Status: DC

## 2012-10-12 MED ORDER — PROMETHAZINE HCL 12.5 MG PO TABS: 12.5000 mg | ORAL_TABLET | Freq: Four times a day (QID) | ORAL | Status: DC | PRN

## 2012-10-12 NOTE — Discharge Summary (Signed)
Physician Discharge Summary  Patient ID: [name] MRN: [id number] DOB/AGE: August 22, 1941 72 y.o.  Admit date: 10/11/2012 Discharge date:  10/12/2012  Procedures:  Procedure(s) (LRB): TOTAL KNEE ARTHROPLASTY (Right)  Attending Physician:  Dr. Durene Romans   Admission Diagnoses:   Right knee OA / pain  Discharge Diagnoses:  Principal Problem:  *S/P right TKA Active Problems:  Expected blood loss anemia  Obese Hyperlipidemia   Osteoarthritis   GERD (gastroesophageal reflux disease)   Dysrhythmia - SVT/ palpitations- controlled with prn Inderal    Migraines   Hypertension   HPI:   [name], 72 y.o. female, has a history of pain and functional disability in the right knee due to arthritis and has failed non-surgical conservative treatments for greater than 12 weeks to includeNSAID's and/or analgesics, corticosteriod injections and activity modification. Onset of symptoms was gradual, starting 6 months ago with rapidlly worsening course since that time. The patient noted no past surgery on the right knee(s). Patient currently rates pain in the right knee(s) at 8 out of 10 with activity. Patient has night pain, worsening of pain with activity and weight bearing, pain that interferes with activities of daily living, pain with passive range of motion and crepitus. Patient has evidence of periarticular osteophytes and joint space narrowing by imaging studies. There is no active infection. Risks, benefits and expectations were discussed with the patient. Patient understand the risks, benefits and expectations and wishes to proceed with surgery.   PCP: Kerby Nora, MD   Discharged Condition: good  Hospital Course:  Patient underwent the above stated procedure on 10/11/2012. Patient tolerated the procedure well and brought to the recovery room in good condition and subsequently to the floor.  POD #1 BP: 121/70 ; Pulse: 70 ; Temp: 97.6 F (36.4 C) ; Resp: 14  Pt's foley  was removed, as well as the hemovac drain removed. IV was changed to a saline lock. Patient reports pain as mild, pain well controlled. No events throughout the night. Ready to be discharged home. Neurovascular intact, dorsiflexion/plantar flexion intact, incision: dressing C/D/I, no cellulitis present and compartment soft.   LABS  Basename  10/12/12 0435   HGB  11.6  HCT  34.2    Discharge Exam: General appearance: alert, cooperative and no distress Extremities: Homans sign is negative, no sign of DVT, no edema, redness or tenderness in the calves or thighs and no ulcers, gangrene or trophic changes  Disposition:  Home with follow up in 2 weeks   Follow-up Information    Follow up with Shelda Pal, MD. In 2 weeks.   Contact information:   [address] [place] [address] [phone]         Discharge Orders    Future Orders Please Complete By Expires   Diet - low sodium heart healthy      Call MD / Call 911      Comments:   If you experience chest pain or shortness of breath, CALL 911 and be transported to the hospital emergency room.  If you develope a fever above 101 F, pus (white drainage) or increased drainage or redness at the wound, or calf pain, call your surgeon's office.   Discharge instructions      Comments:   Maintain surgical dressing for 10-14 days, then replace with gauze and tape. Keep the area dry and clean until follow up. Follow up in 2 weeks at [facility]. Call with any questions or concerns.   Constipation  Prevention      Comments:   Drink plenty of fluids.  Prune juice may be helpful.  You may use a stool softener, such as Colace (over the counter) 100 mg twice a day.  Use MiraLax (over the counter) for constipation as needed.   Increase activity slowly as tolerated      Weight bearing as tolerated      TED hose      Comments:   Use stockings (TED hose) for 2 weeks on both leg(s).  You may remove them at night for  sleeping.   Change dressing      Comments:   Maintain surgical dressing for 10-14 days, then change the dressing daily with sterile 4 x 4 inch gauze dressing and tape. Keep the area dry and clean.       Current Discharge Medication List    START taking these medications   Details  aspirin EC 325 MG tablet Take 1 tablet (325 mg total) by mouth 2 (two) times daily. X 4 weeks Qty: 60 tablet, Refills: 0    diphenhydrAMINE (BENADRYL) 12.5 MG/5ML elixir Take 10 mLs (25 mg total) by mouth every 6 (six) hours as needed for itching. Qty: 120 mL    docusate sodium 100 MG CAPS Take 100 mg by mouth 2 (two) times daily. Qty: 10 capsule    ferrous sulfate 325 (65 FE) MG tablet Take 1 tablet (325 mg total) by mouth 3 (three) times daily after meals.    HYDROcodone-acetaminophen (NORCO) 7.5-325 MG per tablet Take 1-2 tablets by mouth every 4 (four) hours as needed for pain. Qty: 120 tablet, Refills: 0    meloxicam (MOBIC) 7.5 MG tablet Take 1 tablet (7.5 mg total) by mouth 2 (two) times daily as needed for pain. Qty: 60 tablet, Refills: 0    methocarbamol (ROBAXIN) 500 MG tablet Take 1 tablet (500 mg total) by mouth every 6 (six) hours as needed (muscle spasms).    polyethylene glycol (MIRALAX / GLYCOLAX) packet Take 17 g by mouth daily as needed. Qty: 14 each    promethazine (PHENERGAN) 12.5 MG tablet Take 1 tablet (12.5 mg total) by mouth every 6 (six) hours as needed for nausea. Qty: 30 tablet, Refills: 0      CONTINUE these medications which have NOT CHANGED   Details  enalapril-hydrochlorothiazide (VASERETIC) 10-25 MG per tablet Take 0.5 tablets by mouth daily before breakfast. Takes 1/2 tablet    propranolol (INDERAL) 10 MG tablet Take 10 mg by mouth 4 (four) times daily as needed.    temazepam (RESTORIL) 15 MG capsule Take 15 mg by mouth at bedtime as needed. sleep      STOP taking these medications     acetaminophen (TYLENOL) 650 MG CR tablet Comments:  Reason for Stopping:        diclofenac (VOLTAREN) 75 MG EC tablet Comments:  Reason for Stopping:           Signed: Anastasio Auerbach. Devani Odonnel   PAC  10/12/2012, 9:12 AM

## 2012-10-12 NOTE — Progress Notes (Signed)
   Subjective: 1 Day Post-Op Procedure(s) (LRB): TOTAL KNEE ARTHROPLASTY (Right)   Patient reports pain as mild, pain well controlled. No events throughout the night. Ready to be discharged home.  Objective:   VITALS:   Filed Vitals:   10/12/12 0539  BP: 121/70  Pulse: 70  Temp: 97.6 F (36.4 C)  Resp: 14    Neurovascular intact Dorsiflexion/Plantar flexion intact Incision: dressing C/D/I No cellulitis present Compartment soft  LABS  Basename 10/12/12 0435  HGB 11.6*  HCT 34.2*  WBC 14.5*  PLT 235     Basename 10/12/12 0435  NA 136  K 4.3  BUN 13  CREATININE 0.66  GLUCOSE 144*     Assessment/Plan: 1 Day Post-Op Procedure(s) (LRB): TOTAL KNEE ARTHROPLASTY (Right) HV drain d/c'ed Foley cath d/c'ed Advance diet Up with therapy D/C IV fluids Discharge home with home health Follow up in 2 weeks at [facility]. Follow up with OLIN,Nalu Troublefield D in 2 weeks.  Contact information:  [facility] [address], [address] [place] [address] [phone]    Expected ABLA  Treated with iron and will observe  Obese (BMI 30-39.9)  Estimated Body mass index is 35.36 kg/(m^2) as calculated from the following:   Height as of this encounter: 5\' 4" (1.626 m).   Weight as of this encounter: 206 lb(93.441 kg). Patient also counseled that weight may inhibit the healing process Patient counseled that losing weight will help with future health issues     Anastasio Auerbach. Ameli Sangiovanni   PAC  10/12/2012, 8:28 AM

## 2012-10-12 NOTE — Progress Notes (Signed)
10/12/12 1400  PT Visit Information  Last PT Received On 10/12/12  Assistance Needed +1  PT Time Calculation  PT Start Time 1345  PT Stop Time 1426  PT Time Calculation (min) 41 min  Subjective Data  Subjective ok, i am ready  Patient Stated Goal home  Precautions  Precautions Knee  Precaution Comments independent SLR  Restrictions  Other Position/Activity Restrictions WBAT  Cognition  Overall Cognitive Status Appears within functional limits for tasks assessed/performed  Arousal/Alertness Awake/alert  Orientation Level Appears intact for tasks assessed  Behavior During Session [facility] for tasks performed  Transfers  Transfers Sit to Stand;Stand to Sit  Sit to Stand 5: Supervision  Stand to Sit 5: Supervision  Details for Transfer Assistance cues for hand placement  Ambulation/Gait  Ambulation/Gait Assistance 5: Supervision  Ambulation Distance (Feet) 50 Feet  Assistive device Rolling walker  Ambulation/Gait Assistance Details cues for sequence  Gait Pattern Step-to pattern;Step-through pattern  Stairs Yes  Stairs Assistance 4: Min assist  Stairs Assistance Details (indicate cue type and reason) cues for sequence adn technique  Stair Management Technique Backwards;Forwards;With walker;Other (comment) (cane and HHA)  Number of Stairs 9   Exercises  Exercises Total Joint  Total Joint Exercises  Ankle Circles/Pumps AROM;Both;20 reps  Short Arc Quad AROM;Right;10 reps  PT - End of Session  Equipment Utilized During Treatment Gait belt  Activity Tolerance Patient tolerated treatment well  Patient left in chair;with call bell/phone within reach;with family/visitor present  Nurse Communication Mobility status  PT - Assessment/Plan  Comments on Treatment Session pt progressing well; instructed/reviewed ther ex with pt and dtr as pt will not have HHPT until  Friday. Also reviewed tub transfer with tub bench (verbally) with pt and dtr/dtr familiar from pt last surgery  PT Plan  Discharge plan remains appropriate;All goals met and education completed, patient dischaged from PT services  PT Frequency 7X/week  Follow Up Recommendations Home health PT  PT equipment None recommended by PT  Acute Rehab PT Goals  Time For Goal Achievement 10/12/12  Potential to Achieve Goals Good  Pt will go Sit to Stand with supervision  PT Goal: Sit to Stand - Progress Met  Pt will go Stand to Sit with supervision  PT Goal: Stand to Sit - Progress Met  Pt will Ambulate 16 - 50 feet;with supervision;with rolling walker  PT Goal: Ambulate - Progress Met  Pt will Go Up / Down Stairs 1-2 stairs;with min assist;with least restrictive assistive device  PT Goal: Up/Down Stairs - Progress Met  Pt will Perform Home Exercise Program with supervision, verbal cues required/provided  PT Goal: Perform Home Exercise Program - Progress Met  PT General Charges  $$ ACUTE PT VISIT 1 Procedure  PT Treatments  $Gait Training 23-37 mins  $Self Care/Home Management 8-22

## 2012-10-12 NOTE — Care Management Note (Signed)
    Page 1 of 2   10/12/2012     4:04:51 PM   CARE MANAGEMENT NOTE 10/12/2012  Patient:  [name], [name]   Account Number:  [id number]  Date Initiated:  10/12/2012  Documentation initiated by:  Colleen Can  Subjective/Objective Assessment:   dx rt knee osteoarthritis; total knee replacemnt     Action/Plan:   CM spoke with patient. Plans are for her to return to her home in [place] where daughter will be caregiver. She already has RW, commode seat and cane. She is requesting [facility] for [facility] services   Anticipated DC Date:  10/12/2012   Anticipated DC Plan:  HOME W HOME HEALTH SERVICES  In-house referral  NA      DC Planning Services  CM consult      [facility] Choice  HOME HEALTH   Choice offered to / List presented to:  C-1 Patient   DME arranged  NA      DME agency  NA     HH arranged  HH-2 PT  HH-3 OT      [facility] agency  [facility]   Status of service:  Completed, signed off Medicare Important Message given?  NA - LOS <3 / Initial given by admissions (If response is "NO", the following Medicare IM given date fields will be blank) Date Medicare IM given:   Date Additional Medicare IM given:    Discharge Disposition:  HOME W HOME HEALTH SERVICES  Per UR Regulation:    If discussed at Long Length of Stay Meetings, dates discussed:    Comments:  10/12/2012 Colleen Can BSN RN CCM [phone] [facility] rep notified of patient's request for services. Per Rep [facility] Nor[facility] will be able to provide [facility] services with start date of Friday 10/14/2012. Patient notified of contact information for [place]. She will be supplied with exercises to do per physical therapist til she is seen by [facility] agency. Pt voices understanding.

## 2012-10-12 NOTE — Evaluation (Signed)
Physical Therapy Evaluation Patient Details Name: [name] MRN: [id number] DOB: 1941-08-11 Today's Date: 10/12/2012 Time: 4098-1191 PT Time Calculation (min): 41 min  PT Assessment / Plan / Recommendation Clinical Impression  s/p right TKA and will benefit from PT to maximize independence for home with family support    PT Assessment  Patient needs continued PT services    Follow Up Recommendations  Home health PT    Does the patient have the potential to tolerate intense rehabilitation      Barriers to Discharge        Equipment Recommendations  None recommended by PT    Recommendations for Other Services     Frequency 7X/week    Precautions / Restrictions Precautions Precautions: Knee Precaution Comments: independent SLR Restrictions Weight Bearing Restrictions: No Other Position/Activity Restrictions: WBAT   Pertinent Vitals/Pain       Mobility  Bed Mobility Bed Mobility: Supine to Sit Supine to Sit: 4: Min guard Details for Bed Mobility Assistance: for safety Transfers Transfers: Sit to Stand;Stand to Sit Sit to Stand: 4: Min assist;4: Min guard;From bed Stand to Sit: 4: Min assist;4: Min guard;To chair/3-in-1 Details for Transfer Assistance: cues for hand placement Ambulation/Gait Ambulation/Gait Assistance: 4: Min guard;4: Min assist Ambulation Distance (Feet): 60 Feet Assistive device: Rolling walker Ambulation/Gait Assistance Details: cues for sequence Gait Pattern: Step-to pattern;Antalgic    Shoulder Instructions     Exercises Total Joint Exercises Ankle Circles/Pumps: AROM;15 reps;Both Quad Sets: Both;10 reps;AROM Heel Slides: AROM;AAROM;Right;10 reps Hip ABduction/ADduction: AROM;AAROM;Right;10 reps Long Arc Quad: AROM;10 reps;Right   PT Diagnosis: Difficulty walking  PT Problem List: Decreased range of motion;Decreased balance;Decreased safety awareness;Decreased knowledge of use of DME PT Treatment Interventions: Gait  training;DME instruction;Therapeutic activities;Therapeutic exercise;Functional mobility training;Patient/family education   PT Goals Acute Rehab PT Goals PT Goal Formulation: With patient Time For Goal Achievement: 10/12/12 Potential to Achieve Goals: Good Pt will go Sit to Stand: with supervision PT Goal: Sit to Stand - Progress: Goal set today Pt will go Stand to Sit: with supervision PT Goal: Stand to Sit - Progress: Goal set today Pt will Ambulate: 16 - 50 feet;with supervision;with rolling walker PT Goal: Ambulate - Progress: Goal set today Pt will Go Up / Down Stairs: 1-2 stairs;with supervision PT Goal: Up/Down Stairs - Progress: Goal set today Pt will Perform Home Exercise Program: with supervision, verbal cues required/provided PT Goal: Perform Home Exercise Program - Progress: Goal set today  Visit Information  Last PT Received On: 10/12/12 Assistance Needed: +1    Subjective Data  Subjective: are you the therapist? Patient Stated Goal: home   Prior Functioning  Home Living Lives With: Daughter Type of Home: House Home Access: Stairs to enter Secretary/administrator of Steps: 2 Home Layout: Two level;1/2 bath on main level;Bed/bath upstairs Alternate Level Stairs-Number of Steps: 13 Home Adaptive Equipment: Straight cane;Walker - rolling;Bedside commode/3-in-1 Prior Function Level of Independence: Independent Communication Communication: No difficulties    Cognition  Overall Cognitive Status: Appears within functional limits for tasks assessed/performed Arousal/Alertness: Awake/alert Orientation Level: Appears intact for tasks assessed Behavior During Session: [facility] for tasks performed    Extremity/Trunk Assessment Right Upper Extremity Assessment RUE ROM/Strength/Tone: [facility] for tasks assessed Left Upper Extremity Assessment LUE ROM/Strength/Tone: WFL for tasks assessed Right Lower Extremity Assessment RLE ROM/Strength/Tone: Deficits RLE ROM/Strength/Tone  Deficits: AAROM grossly 10 to 65 degrees right knee; able to do SLR, ankle WFL Left Lower Extremity Assessment LLE ROM/Strength/Tone: [facility] for tasks assessed   Balance  End of Session PT - End of Session Equipment Utilized During Treatment: Gait belt Activity Tolerance: Patient tolerated treatment well Patient left: in chair;with call bell/phone within reach;with family/visitor present Nurse Communication: Mobility status  GP     [facility] 10/12/2012, 12:16 PM

## 2012-10-12 NOTE — Progress Notes (Signed)
OT Cancellation Note  Patient Details Name: [name] MRN: [id number] DOB: 06/16/1941   Cancelled Treatment:    Reason Eval/Treat Not Completed: Other (comment) (OT Screen- Per PT pt presents with no OT needs.) Pt recently had other knee replaced.  [name] A OTR/L [phone] 10/12/2012, 11:44 AM

## 2012-10-13 ENCOUNTER — Encounter (HOSPITAL_COMMUNITY): Payer: Self-pay | Admitting: Orthopedic Surgery

## 2012-10-15 LAB — TYPE AND SCREEN
Antibody Screen: POSITIVE
DAT, IgG: NEGATIVE
Unit division: 0

## 2013-02-22 ENCOUNTER — Other Ambulatory Visit: Payer: Self-pay | Admitting: Family Medicine

## 2013-02-23 ENCOUNTER — Telehealth: Payer: Self-pay

## 2013-02-23 NOTE — Telephone Encounter (Signed)
Pt called to ck on status of diclofenac refill; spoke with Brisha at [facility] and rx ready for pick up. Pt notified while on phone.

## 2013-04-03 ENCOUNTER — Telehealth: Payer: Self-pay

## 2013-04-03 NOTE — Telephone Encounter (Signed)
Pt called back and left v/m with pharmacy info; [facility] [place],pt also wants to know if OK to take advil cold and sinus at night with other meds pt is taking.

## 2013-04-03 NOTE — Telephone Encounter (Signed)
Symptomatic care... No advil cold and sinus as that contains decongestant which is contraindicated in HTN. She can take mucinex DM or generic equivalent. Nasal saline irrigation or spray.  See urgent care if not improving in 5-7 days or earlier if shortness of breath.

## 2013-04-03 NOTE — Telephone Encounter (Signed)
Pt is 4 hours from home and not returning until Oct. 3 days ago pt started with non productive cough, no feverh,wheezing SOB or S/T. Pt request OTC med suggestion or if needs med called to pharmacy pt will have pharmacy info when CMA calls back.Please advise.(pt does not want to go to UC).

## 2013-04-03 NOTE — Telephone Encounter (Signed)
Patient advised.

## 2013-04-04 ENCOUNTER — Ambulatory Visit (INDEPENDENT_AMBULATORY_CARE_PROVIDER_SITE_OTHER): Payer: Medicare Other | Admitting: Family Medicine

## 2013-04-04 ENCOUNTER — Encounter: Payer: Self-pay | Admitting: Family Medicine

## 2013-04-04 VITALS — BP 104/60 | HR 96 | Temp 99.3°F | Ht 64.0 in | Wt 195.8 lb

## 2013-04-04 DIAGNOSIS — J069 Acute upper respiratory infection, unspecified: Secondary | ICD-10-CM

## 2013-04-04 DIAGNOSIS — M79605 Pain in left leg: Secondary | ICD-10-CM | POA: Insufficient documentation

## 2013-04-04 DIAGNOSIS — J111 Influenza due to unidentified influenza virus with other respiratory manifestations: Secondary | ICD-10-CM | POA: Insufficient documentation

## 2013-04-04 DIAGNOSIS — M79604 Pain in right leg: Secondary | ICD-10-CM | POA: Insufficient documentation

## 2013-04-04 DIAGNOSIS — M79609 Pain in unspecified limb: Secondary | ICD-10-CM

## 2013-04-04 MED ORDER — BENZONATATE 200 MG PO CAPS: 200.0000 mg | ORAL_CAPSULE | Freq: Two times a day (BID) | ORAL | Status: DC | PRN

## 2013-04-04 NOTE — Patient Instructions (Addendum)
Ask you sister.. Does she have venous insufficiency or arterial perpipheral vascular disease in her legs causing the ulcers. Let me know via MyChart. Can use Mucinex DM during the day. If mucinex not working you can use benzonatate as needed. Call if not improving in 7-10 days as expected.

## 2013-04-04 NOTE — Assessment & Plan Note (Addendum)
She will look into circulation issue. That sister is having.  No sign of arterial insufficiency today, she does have some spider veins.

## 2013-04-04 NOTE — Progress Notes (Signed)
  Subjective:    Patient ID: [name], female    DOB: July 10, 1941, 72 y.o.   MRN: [id number]  Cough This is a new problem. The current episode started in the past 7 days (5 days ago). The problem has been gradually worsening. The problem occurs constantly. The cough is productive of sputum. Associated symptoms include ear pain, nasal congestion and a sore throat. Pertinent negatives include no chest pain, chills, fever, myalgias, postnasal drip, rash, rhinorrhea, shortness of breath or wheezing. Risk factors: Former smoker 5 pack years. She has tried nothing (vit C) for the symptoms. There is no history of asthma, bronchiectasis, bronchitis, COPD, emphysema, environmental allergies or pneumonia.    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 Posterior knee up to thighs.  She has been having pain in legs at night, at rest.  No pain with exertion.  no peripheral swelling.  No varicose veins   Sister with circulation issues. She is concerned about having issues.  Review of Systems  Constitutional: Negative for fever and chills.  HENT: Positive for ear pain and sore throat. Negative for rhinorrhea and postnasal drip.   Respiratory: Positive for cough. Negative for shortness of breath and wheezing.   Cardiovascular: Negative for chest pain.  Musculoskeletal: Negative for myalgias.  Skin: Negative for rash.  Allergic/Immunologic: Negative for environmental allergies.       Objective:   Physical Exam  Constitutional: Vital signs are normal. She appears well-developed and well-nourished. She is cooperative.  Non-toxic appearance. She does not appear ill. No distress.  HENT:  Head: Normocephalic.  Right Ear: Hearing, tympanic membrane, external ear and ear canal normal. Tympanic membrane is not erythematous, not retracted and not bulging.  Left Ear: Hearing, tympanic membrane, external ear and ear canal normal. Tympanic membrane is not erythematous, not retracted and not  bulging.  Nose: Mucosal edema and rhinorrhea present. Right sinus exhibits no maxillary sinus tenderness and no frontal sinus tenderness. Left sinus exhibits no maxillary sinus tenderness and no frontal sinus tenderness.  Mouth/Throat: Uvula is midline, oropharynx is clear and moist and mucous membranes are normal.  Eyes: Conjunctivae, EOM and lids are normal. Pupils are equal, round, and reactive to light. No foreign bodies found.  Neck: Trachea normal and normal range of motion. Neck supple. Carotid bruit is not present. No mass and no thyromegaly present.  Cardiovascular: Normal rate, regular rhythm, S1 normal, S2 normal, normal heart sounds, intact distal pulses and normal pulses.  Exam reveals no gallop and no friction rub.   No murmur heard. Pulmonary/Chest: Effort normal and breath sounds normal. Not tachypneic. No respiratory distress. She has no decreased breath sounds. She has no wheezes. She has no rhonchi. She has no rales.  Neurological: She is alert.  Skin: Skin is warm, dry and intact. No rash noted.  Psychiatric: Her speech is normal and behavior is normal. Judgment normal. Her mood appears not anxious. Cognition and memory are normal. She does not exhibit a depressed mood.          Assessment & Plan:

## 2013-04-10 ENCOUNTER — Telehealth: Payer: Self-pay

## 2013-04-10 MED ORDER — AZITHROMYCIN 250 MG PO TABS: ORAL_TABLET | ORAL | Status: DC

## 2013-04-10 NOTE — Telephone Encounter (Signed)
Patient advised and rx sent to the pharmacy

## 2013-04-10 NOTE — Telephone Encounter (Signed)
Can you address wifes phone note before I call husband

## 2013-04-10 NOTE — Telephone Encounter (Signed)
Pt left v/m; was seen on 04/04/13; pt has prod cough with green phlegm, sorethroat is no better, painful to swallow, pt taking Tylenol, Mucinex DM and Vit C.No fever. [facility] [place], [place].  Pt is at [facility] and plans to return home on 04/28/13.Please advise.

## 2013-04-10 NOTE — Telephone Encounter (Signed)
Call in  zpak 2 tabs po on day 1, then 1 tab po for 4 days

## 2013-05-13 ENCOUNTER — Encounter: Payer: Self-pay | Admitting: Gastroenterology

## 2013-07-15 ENCOUNTER — Other Ambulatory Visit: Payer: Self-pay | Admitting: Cardiovascular Disease

## 2013-10-02 ENCOUNTER — Ambulatory Visit (INDEPENDENT_AMBULATORY_CARE_PROVIDER_SITE_OTHER): Payer: Medicare Other | Admitting: Family Medicine

## 2013-10-02 ENCOUNTER — Encounter: Payer: Self-pay | Admitting: Family Medicine

## 2013-10-02 VITALS — BP 120/64 | HR 83 | Temp 98.7°F | Ht 64.0 in | Wt 201.5 lb

## 2013-10-02 DIAGNOSIS — R209 Unspecified disturbances of skin sensation: Secondary | ICD-10-CM

## 2013-10-02 DIAGNOSIS — R7309 Other abnormal glucose: Secondary | ICD-10-CM

## 2013-10-02 DIAGNOSIS — R200 Anesthesia of skin: Secondary | ICD-10-CM | POA: Insufficient documentation

## 2013-10-02 DIAGNOSIS — B372 Candidiasis of skin and nail: Secondary | ICD-10-CM

## 2013-10-02 MED ORDER — NYSTATIN 100000 UNIT/GM EX CREA: 1.0000 "application " | TOPICAL_CREAM | Freq: Two times a day (BID) | CUTANEOUS | Status: DC

## 2013-10-02 NOTE — Assessment & Plan Note (Signed)
Most consistently ewith carpal tunnel.wear brace at night. Follow up if not improving.. Consider nerve conduction test at that time.

## 2013-10-02 NOTE — Assessment & Plan Note (Signed)
Eval with labs.. ie DM , B12 def,etc.

## 2013-10-02 NOTE — Progress Notes (Signed)
Subjective:    Patient ID: [name], female    DOB: 11/14/1940, 72 y.o.   MRN: [id number]  HPI  72 year old female  with history of  HTN, low back pain, PSVT presents with new onset  Rash in right groin, upper thigh. No current vaginitis, occ itch vaginally with dry skin. No vaginal discharge. Applying cortisone 10 cream, baby powder with out benefit. Rash spreading to lower abdomen.  She has also numbness in left hand and forearm intermttant.  Worse at night if sleeping on that side. Occ pain/ burning rarely if sleeps on that side. She has had this for years, but more frequent.  No weakness, no decrease grip.  Does not type a lot.  She has  Hx of carpal tunnel repair in right hand.  Has also note B toes numb off and on ongoing for a long time.  She has been exposed to grandkids with flu.  Had bad reaction in past to flu shot.  not interested in tamiflu.   Review of Systems  Constitutional: Negative for fever and fatigue.  HENT: Negative for ear pain.   Eyes: Negative for pain.  Respiratory: Negative for chest tightness and shortness of breath.   Cardiovascular: Negative for chest pain, palpitations and leg swelling.  Gastrointestinal: Negative for abdominal pain.  Genitourinary: Negative for dysuria.       Objective:   Physical Exam  Constitutional: Vital signs are normal. She appears well-developed and well-nourished. She is cooperative.  Non-toxic appearance. She does not appear ill. No distress.  HENT:  Head: Normocephalic.  Right Ear: Hearing, tympanic membrane, external ear and ear canal normal. Tympanic membrane is not erythematous, not retracted and not bulging.  Left Ear: Hearing, tympanic membrane, external ear and ear canal normal. Tympanic membrane is not erythematous, not retracted and not bulging.  Nose: No mucosal edema or rhinorrhea. Right sinus exhibits no maxillary sinus tenderness and no frontal sinus tenderness. Left sinus exhibits no  maxillary sinus tenderness and no frontal sinus tenderness.  Mouth/Throat: Uvula is midline, oropharynx is clear and moist and mucous membranes are normal.  Eyes: Conjunctivae, EOM and lids are normal. Pupils are equal, round, and reactive to light. Lids are everted and swept, no foreign bodies found.  Neck: Trachea normal and normal range of motion. Neck supple. Carotid bruit is not present. No mass and no thyromegaly present.  Cardiovascular: Normal rate, regular rhythm, S1 normal, S2 normal, normal heart sounds, intact distal pulses and normal pulses.  Exam reveals no gallop and no friction rub.   No murmur heard. Pulmonary/Chest: Effort normal and breath sounds normal. Not tachypneic. No respiratory distress. She has no decreased breath sounds. She has no wheezes. She has no rhonchi. She has no rales.  Abdominal: Soft. Normal appearance and bowel sounds are normal. There is no tenderness.  Musculoskeletal:       Left wrist: She exhibits decreased range of motion and tenderness.       Cervical back: She exhibits decreased range of motion. She exhibits no tenderness and no bony tenderness.  Positive tinel and phalen  Neurological: She is alert. She has normal strength. A sensory deficit is present. Coordination and gait normal.  Decreased sensation to touch in B 2nd through 5th digits.  Skin: Skin is warm, dry and intact. Rash noted. Rash is maculopapular.  Macular rash in right groin, leading edge, erythematous, itchy per pt. Scaly flake in areas  Psychiatric: Her speech is normal and behavior is normal.  Judgment and thought content normal. Her mood appears not anxious. Cognition and memory are normal. She does not exhibit a depressed mood.          Assessment & Plan:

## 2013-10-02 NOTE — Assessment & Plan Note (Signed)
Treat with nystatin cream. 

## 2013-10-02 NOTE — Patient Instructions (Addendum)
Apply cream to skin... Continue for 48 hours after rash resolved. Stop at lab on way out Go ahead and start wearing wrist brace at bedtime for presumed carpal tunnel.  Follow up in 2 weeks if numbness in hand  or rash not improving.

## 2013-10-02 NOTE — Progress Notes (Signed)
Pre-visit discussion using our clinic review tool. No additional management support is needed unless otherwise documented below in the visit note.  

## 2013-10-03 ENCOUNTER — Encounter: Payer: Self-pay | Admitting: *Deleted

## 2013-10-03 LAB — CBC WITH DIFFERENTIAL/PLATELET
Basophils Absolute: 0.1 10*3/uL (ref 0.0–0.1)
Eosinophils Absolute: 0.4 10*3/uL (ref 0.0–0.7)
Lymphocytes Relative: 32.3 % (ref 12.0–46.0)
MCHC: 33.4 g/dL (ref 30.0–36.0)
Neutrophils Relative %: 57.2 % (ref 43.0–77.0)
RBC: 4.31 Mil/uL (ref 3.87–5.11)
RDW: 14.9 % — ABNORMAL HIGH (ref 11.5–14.6)

## 2013-10-03 LAB — TSH: TSH: 1.24 u[IU]/mL (ref 0.35–5.50)

## 2013-10-03 LAB — VITAMIN B12: Vitamin B-12: 350 pg/mL (ref 211–911)

## 2013-10-09 ENCOUNTER — Telehealth: Payer: Self-pay

## 2013-10-09 NOTE — Telephone Encounter (Signed)
Appointment scheduled 10/10/2013 @ 10:30am with Dr. Diona Browner.

## 2013-10-09 NOTE — Telephone Encounter (Signed)
Rash did not appear consistent with staph at appt.  If rash is spreading/changing... Recommend follow up for re-eval for consideration of antibiotics to be safe today or at least by tomorrow.

## 2013-10-09 NOTE — Telephone Encounter (Signed)
Pt seen 10/02/13 rash on leg and lower stomach has spread more on stomach, is red and itchy and Nystatin not helping. Pt said 10/11/12 was in hospital dx with staph infection and was given mupirocin ointment. Pt wants to know if should try mupirocin. Pt request cb today. [facility] [address].

## 2013-10-09 NOTE — Telephone Encounter (Signed)
Left message for Mrs. Biggar to call our office and schedule an appointment with Dr. Diona Browner to re-evaluate her rash.

## 2013-10-10 ENCOUNTER — Ambulatory Visit (INDEPENDENT_AMBULATORY_CARE_PROVIDER_SITE_OTHER): Payer: Medicare Other | Admitting: Family Medicine

## 2013-10-10 ENCOUNTER — Encounter: Payer: Self-pay | Admitting: Family Medicine

## 2013-10-10 VITALS — BP 100/54 | HR 78 | Temp 98.9°F | Ht 64.0 in | Wt 201.5 lb

## 2013-10-10 DIAGNOSIS — B372 Candidiasis of skin and nail: Secondary | ICD-10-CM

## 2013-10-10 MED ORDER — FLUCONAZOLE 100 MG PO TABS: 100.0000 mg | ORAL_TABLET | Freq: Every day | ORAL | Status: DC

## 2013-10-10 NOTE — Patient Instructions (Signed)
Start oral antifungal x 2 weeks.. Call if on improvement or spreading in 1 week for referral to Derm.

## 2013-10-10 NOTE — Progress Notes (Signed)
Pre-visit discussion using our clinic review tool. No additional management support is needed unless otherwise documented below in the visit note.  

## 2013-10-10 NOTE — Assessment & Plan Note (Signed)
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 Will try 2 week course of fluconazole 100 mg daily. If not improving as expected refer to [facility].  no clear sign of bacterial infection.

## 2013-10-10 NOTE — Progress Notes (Signed)
   Subjective:    Patient ID: [name], female    DOB: 1941-09-06, 73 y.o.   MRN: [id number]  HPI 73 year old female returns to clinic with no resolution of rash in intertriginous regions previosuly thought to be candidal. No improvement and instead spreading a little with use of nystatin cream BID  Still very itchy.   No blisters, no pustules. No fever, no N/V.   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  Review of Systems  Constitutional: Negative for fever and fatigue.  HENT: Negative for ear pain.   Eyes: Negative for pain.  Respiratory: Negative for chest tightness and shortness of breath.   Cardiovascular: Negative for chest pain, palpitations and leg swelling.  Gastrointestinal: Negative for abdominal pain.  Genitourinary: Negative for dysuria.       Objective:   Physical Exam  Constitutional: Vital signs are normal. She appears well-developed and well-nourished. She is cooperative.  Non-toxic appearance. She does not appear ill. No distress.  HENT:  Head: Normocephalic.  Right Ear: Hearing, tympanic membrane, external ear and ear canal normal. Tympanic membrane is not erythematous, not retracted and not bulging.  Left Ear: Hearing, tympanic membrane, external ear and ear canal normal. Tympanic membrane is not erythematous, not retracted and not bulging.  Nose: No mucosal edema or rhinorrhea. Right sinus exhibits no maxillary sinus tenderness and no frontal sinus tenderness. Left sinus exhibits no maxillary sinus tenderness and no frontal sinus tenderness.  Mouth/Throat: Uvula is midline, oropharynx is clear and moist and mucous membranes are normal.  Eyes: Conjunctivae, EOM and lids are normal. Pupils are equal, round, and reactive to light. Lids are everted and swept, no foreign bodies found.  Neck: Trachea normal and normal range of motion. Neck supple. Carotid bruit is not present. No mass and no thyromegaly present.    Cardiovascular: Normal rate, regular rhythm, S1 normal, S2 normal, normal heart sounds, intact distal pulses and normal pulses.  Exam reveals no gallop and no friction rub.   No murmur heard. Pulmonary/Chest: Effort normal and breath sounds normal. Not tachypneic. No respiratory distress. She has no decreased breath sounds. She has no wheezes. She has no rhonchi. She has no rales.  Abdominal: Soft. Normal appearance and bowel sounds are normal. There is no tenderness.  Neurological: She is alert.  Skin: Skin is warm, dry and intact. No rash noted.  B groin with erythematous plaque, some inflammed SKs, powder on rash makes it appear very dry anf flaky  Psychiatric: Her speech is normal and behavior is normal. Judgment and thought content normal. Her mood appears not anxious. Cognition and memory are normal. She does not exhibit a depressed mood.          Assessment & Plan:

## 2013-10-19 ENCOUNTER — Telehealth: Payer: Self-pay

## 2013-10-19 DIAGNOSIS — R21 Rash and other nonspecific skin eruption: Secondary | ICD-10-CM

## 2013-10-19 NOTE — Telephone Encounter (Signed)
Patient notified as instructed by telephone.  Advised Rosaria Ferries will be calling her once she gets the Dermatology appointment scheduled.

## 2013-10-19 NOTE — Telephone Encounter (Signed)
Pt left v/m; pt wants to know if can stop fluconazole; pt has taken 8 pills with no improvement; rash still bright red and uncomfortable. Do you want to do dermatology referral. Pt request cb.

## 2013-10-19 NOTE — Telephone Encounter (Signed)
Stop fluconazole. Refer to derm.

## 2014-02-12 ENCOUNTER — Other Ambulatory Visit: Payer: Self-pay | Admitting: Family Medicine

## 2014-02-12 NOTE — Telephone Encounter (Signed)
Last office visit  10/10/2013.  Ok to refill?

## 2014-04-30 ENCOUNTER — Telehealth: Payer: Self-pay

## 2014-04-30 NOTE — Telephone Encounter (Signed)
Agreed -

## 2014-04-30 NOTE — Telephone Encounter (Signed)
Yes.   D/c enalapril-HCTZ  HCTZ 25 mg, 1 po daily, #30, 3 ref  Check BP at home or drug store. Goal < 130/80

## 2014-04-30 NOTE — Telephone Encounter (Signed)
Pt left v/m; years ago had cough with enalapril and then cough went away; pt has been taking enalapril HCTZ for long time and pt has developed same dry cough in last 1 -2 months that pt had years ago. Pt is aware Dr Diona Browner is out of the office this week and wants to know is it possible that enalapril is causing cough after all these years or what to do?pt request cb.

## 2014-05-01 MED ORDER — HYDROCHLOROTHIAZIDE 25 MG PO TABS: 25.0000 mg | ORAL_TABLET | Freq: Every day | ORAL | Status: DC

## 2014-05-01 NOTE — Telephone Encounter (Signed)
Ms. Funchess notified as instructed by telephone.  Prescription for HCTZ sent to [facility].  Medication list updated.

## 2014-07-10 ENCOUNTER — Encounter: Payer: Self-pay | Admitting: Family Medicine

## 2014-07-10 ENCOUNTER — Ambulatory Visit (INDEPENDENT_AMBULATORY_CARE_PROVIDER_SITE_OTHER): Payer: Medicare Other | Admitting: Family Medicine

## 2014-07-10 VITALS — BP 112/60 | HR 78 | Temp 98.7°F | Ht 64.0 in | Wt 208.5 lb

## 2014-07-10 DIAGNOSIS — G5602 Carpal tunnel syndrome, left upper limb: Secondary | ICD-10-CM

## 2014-07-10 DIAGNOSIS — M6530 Trigger finger, unspecified finger: Secondary | ICD-10-CM

## 2014-07-10 DIAGNOSIS — R1011 Right upper quadrant pain: Secondary | ICD-10-CM

## 2014-07-10 DIAGNOSIS — R109 Unspecified abdominal pain: Secondary | ICD-10-CM

## 2014-07-10 DIAGNOSIS — Z23 Encounter for immunization: Secondary | ICD-10-CM

## 2014-07-10 MED ORDER — HYDROCHLOROTHIAZIDE 25 MG PO TABS: 25.0000 mg | ORAL_TABLET | Freq: Every day | ORAL | Status: DC

## 2014-07-10 MED ORDER — TRAMADOL HCL 50 MG PO TABS: 50.0000 mg | ORAL_TABLET | Freq: Three times a day (TID) | ORAL | Status: DC | PRN

## 2014-07-10 NOTE — Progress Notes (Signed)
Pre visit review using our clinic review tool, if applicable. No additional management support is needed unless otherwise documented below in the visit note. 

## 2014-07-10 NOTE — Assessment & Plan Note (Signed)
Refer to hand specialist for likely steroid injection in nodule.

## 2014-07-10 NOTE — Patient Instructions (Addendum)
Can use tramadol for pain. Stop at front desk to set up referral. Gentle back stretching. Call if right flank pain is not improving as expected.

## 2014-07-10 NOTE — Assessment & Plan Note (Addendum)
Failed NSAIDs, failed long term bracing.  Evidence of muscle weakness and atrophy. Tramadol for temporary pain control.  Recommend referral for  likely surgery with hand specialist. Pt agreeable.

## 2014-07-10 NOTE — Assessment & Plan Note (Signed)
Likely muscle strain. No symptoms of urinary issues.  Start home stretches, tramadol for pain. Call if not improving in 2 weeks as expected.

## 2014-07-10 NOTE — Progress Notes (Signed)
Subjective:    Patient ID: [name], female    DOB: 14-Dec-1940, 73 y.o.   MRN: [id number]  HPI  73 year old female presents with worsening pain and numbness in left hand. Ongoing x 12 years. Worse in last 6 months. Shooting pain severe at times.. Shock feeling.  Numbness in fingers, pain most in 3rd and 4th digits.  Mild neck stiffness, no pain. No pain radaiting from neck.  Using diclofenac occ, causes some lightheadedness.  no benefit from celebrex in past. She has been wearing brace regularly for last 9-12 months without any benefit.    She has trigger finger on left hand, pinky.  S/P carpal tunnel surgery right  20 years ago.   Pain in right flank x 2-3 weeks. No fallen, no known injury.  No dysuria, no fever.   Review of Systems  Constitutional: Negative for fever and fatigue.  HENT: Negative for ear pain.   Eyes: Negative for pain.  Respiratory: Negative for chest tightness and shortness of breath.   Cardiovascular: Negative for chest pain, palpitations and leg swelling.  Gastrointestinal: Negative for abdominal pain.  Genitourinary: Negative for dysuria, frequency, hematuria, vaginal bleeding and pelvic pain.       Objective:   Physical Exam  Constitutional: Vital signs are normal. She appears well-developed and well-nourished. She is cooperative.  Non-toxic appearance. She does not appear ill. No distress.  HENT:  Head: Normocephalic.  Right Ear: Hearing, tympanic membrane, external ear and ear canal normal. Tympanic membrane is not erythematous, not retracted and not bulging.  Left Ear: Hearing, tympanic membrane, external ear and ear canal normal. Tympanic membrane is not erythematous, not retracted and not bulging.  Nose: No mucosal edema or rhinorrhea. Right sinus exhibits no maxillary sinus tenderness and no frontal sinus tenderness. Left sinus exhibits no maxillary sinus tenderness and no frontal sinus tenderness.  Mouth/Throat: Uvula is midline,  oropharynx is clear and moist and mucous membranes are normal.  Eyes: Conjunctivae, EOM and lids are normal. Pupils are equal, round, and reactive to light. Lids are everted and swept, no foreign bodies found.  Neck: Trachea normal and normal range of motion. Neck supple. Carotid bruit is not present. No mass and no thyromegaly present.  Cardiovascular: Normal rate, regular rhythm, S1 normal, S2 normal, normal heart sounds, intact distal pulses and normal pulses.  Exam reveals no gallop and no friction rub.   No murmur heard. Pulmonary/Chest: Effort normal and breath sounds normal. Not tachypneic. No respiratory distress. She has no decreased breath sounds. She has no wheezes. She has no rhonchi. She has no rales.  Abdominal: Soft. Normal appearance and bowel sounds are normal. There is no tenderness.  Musculoskeletal:       Cervical back: Normal. She exhibits normal range of motion, no tenderness and no bony tenderness.       Thoracic back: She exhibits decreased range of motion and tenderness. She exhibits no bony tenderness.  Neg spurling B  Right paravertebral ttp   Neurological: She is alert. She displays atrophy. She displays no tremor. No cranial nerve deficit or sensory deficit. She exhibits abnormal muscle tone. Coordination normal.  Decrease grip strength on left, atrophy of left thenar prominence  Positive tinel and phalen  Sign on left  Skin: Skin is warm, dry and intact. No rash noted.  No rash at site of flank pain  Psychiatric: Her speech is normal and behavior is normal. Judgment and thought content normal. Her mood appears not anxious. Cognition  and memory are normal. She does not exhibit a depressed mood.          Assessment & Plan:

## 2014-09-04 ENCOUNTER — Telehealth: Payer: Self-pay

## 2014-09-04 NOTE — Telephone Encounter (Signed)
Spoke to pt and pt declined making an annual wellness visit at this time. Pt indicated that there are too many appt coming up and with the holidays coming too, pt is too busy.

## 2014-11-13 ENCOUNTER — Encounter: Payer: Self-pay | Admitting: Family Medicine

## 2014-11-13 ENCOUNTER — Ambulatory Visit (INDEPENDENT_AMBULATORY_CARE_PROVIDER_SITE_OTHER): Payer: Medicare Other | Admitting: Family Medicine

## 2014-11-13 ENCOUNTER — Encounter: Payer: Self-pay | Admitting: *Deleted

## 2014-11-13 VITALS — BP 122/70 | HR 83 | Temp 98.3°F | Ht 64.0 in | Wt 210.2 lb

## 2014-11-13 DIAGNOSIS — H8113 Benign paroxysmal vertigo, bilateral: Secondary | ICD-10-CM

## 2014-11-13 DIAGNOSIS — H6983 Other specified disorders of Eustachian tube, bilateral: Secondary | ICD-10-CM

## 2014-11-13 DIAGNOSIS — J0100 Acute maxillary sinusitis, unspecified: Secondary | ICD-10-CM

## 2014-11-13 DIAGNOSIS — E785 Hyperlipidemia, unspecified: Secondary | ICD-10-CM

## 2014-11-13 DIAGNOSIS — R7303 Prediabetes: Secondary | ICD-10-CM

## 2014-11-13 DIAGNOSIS — H6990 Unspecified Eustachian tube disorder, unspecified ear: Secondary | ICD-10-CM | POA: Insufficient documentation

## 2014-11-13 DIAGNOSIS — J019 Acute sinusitis, unspecified: Secondary | ICD-10-CM | POA: Insufficient documentation

## 2014-11-13 DIAGNOSIS — H8110 Benign paroxysmal vertigo, unspecified ear: Secondary | ICD-10-CM | POA: Insufficient documentation

## 2014-11-13 DIAGNOSIS — R7309 Other abnormal glucose: Secondary | ICD-10-CM

## 2014-11-13 DIAGNOSIS — H6980 Other specified disorders of Eustachian tube, unspecified ear: Secondary | ICD-10-CM | POA: Insufficient documentation

## 2014-11-13 LAB — LIPID PANEL
CHOLESTEROL: 195 mg/dL (ref 0–200)
HDL: 51.1 mg/dL (ref >39.00)
LDL Cholesterol: 121 mg/dL — ABNORMAL HIGH (ref 0–99)
NonHDL: 143.9
TRIGLYCERIDES: 114 mg/dL (ref 0.0–149.0)
Total CHOL/HDL Ratio: 4
VLDL: 22.8 mg/dL (ref 0.0–40.0)

## 2014-11-13 LAB — COMPREHENSIVE METABOLIC PANEL
ALT: 18 U/L (ref 0–35)
AST: 20 U/L (ref 0–37)
Albumin: 4.1 g/dL (ref 3.5–5.2)
Alkaline Phosphatase: 81 U/L (ref 39–117)
BILIRUBIN TOTAL: 0.5 mg/dL (ref 0.2–1.2)
BUN: 15 mg/dL (ref 6–23)
CO2: 31 mEq/L (ref 19–32)
CREATININE: 0.64 mg/dL (ref 0.40–1.20)
Calcium: 9.4 mg/dL (ref 8.4–10.5)
Chloride: 103 mEq/L (ref 96–112)
GFR: 95.99 mL/min (ref >60.00)
GLUCOSE: 98 mg/dL (ref 70–99)
Potassium: 4.1 mEq/L (ref 3.5–5.1)
Sodium: 139 mEq/L (ref 135–145)
Total Protein: 7.4 g/dL (ref 6.0–8.3)

## 2014-11-13 LAB — HEMOGLOBIN A1C: Hgb A1c MFr Bld: 6 % (ref 4.6–6.5)

## 2014-11-13 MED ORDER — AMOXICILLIN 500 MG PO CAPS: 1000.0000 mg | ORAL_CAPSULE | Freq: Two times a day (BID) | ORAL | Status: DC

## 2014-11-13 NOTE — Progress Notes (Signed)
Subjective:    Patient ID: [name], female    DOB: [birth year], 74 y.o.   MRN: [id number]  HPI   74 year old female with history of HTN, prediabetes, PSVT presents with new onset dizzy spells since 10/09/2914.  She awoke on 10/09/2014 room spinning, nausea when she sat up in bed.  Went back to bed, when she woke up it was gone.  She has felt episodes of vertigo coming on when rolling around in bed. Occ triggered by looking up.  She had severe cold early in JAnuary, improved but not fully. She has had some congestion, some post nasal drip. Facial pain and sinus pressure ongoing now 3 weeks.. Throbbing headache behind her eyes. No ear pain. She has been using  advil cold and sinus with decongestant for 1.5 weeks.  BP Readings from Last 3 Encounters:  11/13/14 122/70  07/10/14 112/60  10/10/13 100/54  No chest pain, no shortness of breath. Some mild increase in fluttering lately but not associated with her vertigo spells.  Has hx of BPPV years ago. Review of Systems  All other systems reviewed and are negative.      Objective:   Physical Exam  Constitutional: She is oriented to person, place, and time. Vital signs are normal. She appears well-developed and well-nourished. She is cooperative.  Non-toxic appearance. She does not appear ill. No distress.  HENT:  Head: Normocephalic.  Right Ear: Hearing, tympanic membrane, external ear and ear canal normal. Tympanic membrane is not erythematous, not retracted and not bulging.  Left Ear: Hearing, tympanic membrane, external ear and ear canal normal. Tympanic membrane is not erythematous, not retracted and not bulging.  Nose: Nose normal. No mucosal edema or rhinorrhea. Right sinus exhibits no maxillary sinus tenderness and no frontal sinus tenderness. Left sinus exhibits no maxillary sinus tenderness and no frontal sinus tenderness.  Mouth/Throat: Uvula is midline, oropharynx is clear and moist and mucous membranes are normal.    Eyes: Conjunctivae, EOM and lids are normal. Pupils are equal, round, and reactive to light. Lids are everted and swept, no foreign bodies found.  Neck: Trachea normal and normal range of motion. Neck supple. Carotid bruit is not present. No thyroid mass and no thyromegaly present.  Cardiovascular: Normal rate, regular rhythm, S1 normal, S2 normal, normal heart sounds, intact distal pulses and normal pulses.  Exam reveals no gallop and no friction rub.   No murmur heard. Pulmonary/Chest: Effort normal and breath sounds normal. No tachypnea. No respiratory distress. She has no decreased breath sounds. She has no wheezes. She has no rhonchi. She has no rales.  Abdominal: Soft. Normal appearance and bowel sounds are normal. She exhibits no distension, no fluid wave, no abdominal bruit and no mass. There is no hepatosplenomegaly. There is no tenderness. There is no rebound, no guarding and no CVA tenderness. No hernia.  Lymphadenopathy:    She has no cervical adenopathy.    She has no axillary adenopathy.  Neurological: She is alert and oriented to person, place, and time. She has normal strength and normal reflexes. No cranial nerve deficit or sensory deficit. She exhibits normal muscle tone. She displays a negative Romberg sign. Coordination and gait normal. GCS eye subscore is 4. GCS verbal subscore is 5. GCS motor subscore is 6.  Nml cerebellar exam   No papilledema  Positive Dix Hallpike  Skin: Skin is warm, dry and intact. No rash noted.  Psychiatric: She has a normal mood and affect. Her speech is  normal and behavior is normal. Judgment and thought content normal. Her mood appears not anxious. Cognition and memory are normal. Cognition and memory are not impaired. She does not exhibit a depressed mood. She exhibits normal recent memory and normal remote memory.          Assessment & Plan:     Vertigo;l NO sign of neuro changeds, no clear cardiac etiology.  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  Treat with home desensitization maneuvers.  Acute sinus infection and eustachian tube dysfunction: Treat with antibiotics and nasal steroid.   She ids overdue for medicare wellness with screening labs... Send for eval and set up appt.

## 2014-11-13 NOTE — Patient Instructions (Signed)
Stop at lab on way out for fasting labs.  Schedule medicare wellness in next few months. Complete antibiotics. Start nasal flonase OTC 2 sprays per nostril daily. Start home BPPV exercises.

## 2014-11-13 NOTE — Progress Notes (Signed)
Pre visit review using our clinic review tool, if applicable. No additional management support is needed unless otherwise documented below in the visit note. 

## 2015-01-21 ENCOUNTER — Other Ambulatory Visit: Payer: Self-pay | Admitting: Family Medicine

## 2015-02-15 ENCOUNTER — Other Ambulatory Visit: Payer: Self-pay | Admitting: Family Medicine

## 2015-02-15 NOTE — Telephone Encounter (Signed)
Last office visit 11/13/2014.  Last refilled 02/12/2014 for #60 with 2 refills.  Ok to refill?

## 2015-04-05 ENCOUNTER — Encounter: Payer: Medicare Other | Admitting: Family Medicine

## 2015-07-31 ENCOUNTER — Telehealth: Payer: Self-pay | Admitting: Family Medicine

## 2015-07-31 NOTE — Telephone Encounter (Signed)
Lab 12/1 cpx 12/7 Pt aware Please close

## 2015-07-31 NOTE — Telephone Encounter (Signed)
Please call and schedule CPE with fasting lab for Dr. Diona Browner.

## 2015-09-05 ENCOUNTER — Other Ambulatory Visit (INDEPENDENT_AMBULATORY_CARE_PROVIDER_SITE_OTHER): Payer: Medicare Other

## 2015-09-05 ENCOUNTER — Telehealth: Payer: Self-pay | Admitting: Family Medicine

## 2015-09-05 DIAGNOSIS — E785 Hyperlipidemia, unspecified: Secondary | ICD-10-CM

## 2015-09-05 DIAGNOSIS — R7303 Prediabetes: Secondary | ICD-10-CM

## 2015-09-05 LAB — COMPREHENSIVE METABOLIC PANEL
ALT: 21 U/L (ref 0–35)
AST: 22 U/L (ref 0–37)
Albumin: 4.1 g/dL (ref 3.5–5.2)
Alkaline Phosphatase: 81 U/L (ref 39–117)
BILIRUBIN TOTAL: 0.5 mg/dL (ref 0.2–1.2)
BUN: 16 mg/dL (ref 6–23)
CALCIUM: 9.4 mg/dL (ref 8.4–10.5)
CO2: 33 mEq/L — ABNORMAL HIGH (ref 19–32)
Chloride: 102 mEq/L (ref 96–112)
Creatinine, Ser: 0.78 mg/dL (ref 0.40–1.20)
GFR: 76.23 mL/min (ref >60.00)
GLUCOSE: 100 mg/dL — AB (ref 70–99)
Potassium: 3.9 mEq/L (ref 3.5–5.1)
Sodium: 142 mEq/L (ref 135–145)
TOTAL PROTEIN: 7.5 g/dL (ref 6.0–8.3)

## 2015-09-05 LAB — LIPID PANEL
CHOLESTEROL: 204 mg/dL — AB (ref 0–200)
HDL: 43.3 mg/dL (ref >39.00)
LDL Cholesterol: 138 mg/dL — ABNORMAL HIGH (ref 0–99)
NONHDL: 160.65
Total CHOL/HDL Ratio: 5
Triglycerides: 113 mg/dL (ref 0.0–149.0)
VLDL: 22.6 mg/dL (ref 0.0–40.0)

## 2015-09-05 NOTE — Telephone Encounter (Signed)
-----   Message from Ellamae Sia sent at 09/03/2015  2:45 PM EST ----- Regarding: Lab orders for Thursday, 12.1.16 Patient is scheduled for CPX labs, please order future labs, Thanks , Karna Christmas

## 2015-09-11 ENCOUNTER — Ambulatory Visit (INDEPENDENT_AMBULATORY_CARE_PROVIDER_SITE_OTHER): Payer: Medicare Other | Admitting: Family Medicine

## 2015-09-11 ENCOUNTER — Encounter: Payer: Self-pay | Admitting: Family Medicine

## 2015-09-11 VITALS — BP 116/60 | HR 75 | Temp 98.8°F | Ht 63.5 in | Wt 209.5 lb

## 2015-09-11 DIAGNOSIS — Z7189 Other specified counseling: Secondary | ICD-10-CM

## 2015-09-11 DIAGNOSIS — R7303 Prediabetes: Secondary | ICD-10-CM | POA: Diagnosis not present

## 2015-09-11 DIAGNOSIS — M545 Low back pain, unspecified: Secondary | ICD-10-CM | POA: Insufficient documentation

## 2015-09-11 DIAGNOSIS — E785 Hyperlipidemia, unspecified: Secondary | ICD-10-CM | POA: Diagnosis not present

## 2015-09-11 DIAGNOSIS — G8929 Other chronic pain: Secondary | ICD-10-CM | POA: Insufficient documentation

## 2015-09-11 DIAGNOSIS — E669 Obesity, unspecified: Secondary | ICD-10-CM

## 2015-09-11 DIAGNOSIS — M79673 Pain in unspecified foot: Secondary | ICD-10-CM

## 2015-09-11 DIAGNOSIS — I471 Supraventricular tachycardia: Secondary | ICD-10-CM

## 2015-09-11 DIAGNOSIS — H8113 Benign paroxysmal vertigo, bilateral: Secondary | ICD-10-CM

## 2015-09-11 DIAGNOSIS — I10 Essential (primary) hypertension: Secondary | ICD-10-CM | POA: Diagnosis not present

## 2015-09-11 DIAGNOSIS — Z0000 Encounter for general adult medical examination without abnormal findings: Secondary | ICD-10-CM | POA: Insufficient documentation

## 2015-09-11 DIAGNOSIS — M79643 Pain in unspecified hand: Secondary | ICD-10-CM

## 2015-09-11 DIAGNOSIS — Z1211 Encounter for screening for malignant neoplasm of colon: Secondary | ICD-10-CM

## 2015-09-11 NOTE — Assessment & Plan Note (Signed)
Stable, rare episodes. Asymptomatic on no med.

## 2015-09-11 NOTE — Patient Instructions (Addendum)
Work on 3-5 times a week exercise.  Work low cholesterol, low fat, low carb.  Stop at lab on way out for stool cards.  Call if back pain not controlled with diclofenac or return  to Dr. Berenice Primas.  We will get records from [facility] about possible rheum arthritis diagnosis. MAy need further testing. Change  To dye free detergent and use hypoallergenic soap.  Do home vertigo exercises.. Call for referral for balance retraining if not improving as expected.

## 2015-09-11 NOTE — Assessment & Plan Note (Signed)
Well controlled. Continue current medication.  

## 2015-09-11 NOTE — Assessment & Plan Note (Signed)
Almost at goal > info on DASH diet given.

## 2015-09-11 NOTE — Progress Notes (Signed)
Pre visit review using our clinic review tool, if applicable. No additional management support is needed unless otherwise documented below in the visit note. 

## 2015-09-11 NOTE — Assessment & Plan Note (Signed)
Low carb diet, exercise and weight loss. 

## 2015-09-11 NOTE — Assessment & Plan Note (Signed)
Treat with diclofenac as needed. Weight loss, core strengthening. If not improving .. eval with X-ray or consider return to Dr. Berenice Primas for further eval/ treat/ steroid injecitons.

## 2015-09-11 NOTE — Progress Notes (Signed)
Subjective:    Patient ID: [name], female    DOB: 19-Nov-1940, 74 y.o.   MRN: [id number]  HPI   I have personally reviewed the Medicare Annual Wellness questionnaire and have noted 1. The patient's medical and social history 2. Their use of alcohol, tobacco or illicit drugs 3. Their current medications and supplements 4. The patient's functional ability including ADL's, fall risks, home safety risks and hearing or visual             impairment. 5. Diet and physical activities 6. Evidence for depression or mood disorders 7.         Updated provider list Cognitive evaluation was performed and recorded on pt medicare questionnaire form. The patients weight, height, BMI and visual acuity have been recorded in the chart  I have made referrals, counseling and provided education to the patient based review of the above and I have provided the pt with a written personalized care plan for preventive services.    Chronic low back pain and bilateral leg pain, thighs, worsen at night. Ongoing x 1 year. No pain when up standing.. No leg pain. 1 year ago she saw Dr. Berenice Primas for sciatica.. Injection helped her back a lot. Helped until last year, noSw both legs painful.  She was told in past stenosis. She uses diclofenac as neded for pain.Marland Kitchen Helps   Occ vag itching, no discharge X intermittant No new falls.   Intermittent BPPV off and on sine dx in 11/2104.  Bilateral foot and hand pain x year worse in last year .Marland Kitchen Told last year per ORTHO X-ray looked like RA. We  Were never sent X-rays or notes. Not treated or evaluated. No redness occ swelling.  She only had him take care of carpal tunnel.   Elevated Cholesterol:  LDL almost at goal < 130 on no medication. Lab Results  Component Value Date   CHOL 204* 09/05/2015   HDL 43.30 09/05/2015   LDLCALC 138* 09/05/2015   LDLDIRECT 151.5 08/17/2012   TRIG 113.0 09/05/2015   CHOLHDL 5 09/05/2015  Diet compliance: Healthy Exercise:  walking Other complaints:  Hypertension:  Well controlled on HCTZ. BP Readings from Last 3 Encounters:  09/11/15 116/60  11/13/14 122/70  07/10/14 112/60  Using medication without problems or lightheadedness: None Chest pain with exertion:None Edema:None Short of breath:None Average home BPs:not checking. Other issues: PSVT: occ feeling like heart flips. No associated SE.Marland Kitchen   Prediabetes:  Glucose 100 fasting on recent labs. Lab Results  Component Value Date   HGBA1C 6.0 11/13/2014   Wt Readings from Last 3 Encounters:  09/11/15 209 lb 8 oz (95.029 kg)  11/13/14 210 lb 4 oz (95.369 kg)  07/10/14 208 lb 8 oz (94.575 kg)   Body mass index is 36.52 kg/(m^2).    Review of Systems  Constitutional: Negative for fever and fatigue.  HENT: Negative for ear pain.   Eyes: Negative for pain.  Respiratory: Negative for chest tightness and shortness of breath.   Cardiovascular: Negative for chest pain, palpitations and leg swelling.  Gastrointestinal: Negative for abdominal pain.  Genitourinary: Negative for dysuria.       Objective:   Physical Exam  Constitutional: Vital signs are normal. She appears well-developed and well-nourished. She is cooperative.  Non-toxic appearance. She does not appear ill. No distress.  HENT:  Head: Normocephalic.  Right Ear: Hearing, tympanic membrane, external ear and ear canal normal.  Left Ear: Hearing, tympanic membrane, external ear and ear canal normal.  Nose: Nose normal.  Eyes: Conjunctivae, EOM and lids are normal. Pupils are equal, round, and reactive to light. Lids are everted and swept, no foreign bodies found.  Neck: Trachea normal and normal range of motion. Neck supple. Carotid bruit is not present. No thyroid mass and no thyromegaly present.  Cardiovascular: Normal rate, regular rhythm, S1 normal, S2 normal, normal heart sounds and intact distal pulses.  Exam reveals no gallop.   No murmur heard. Pulmonary/Chest: Effort normal and  breath sounds normal. No respiratory distress. She has no wheezes. She has no rhonchi. She has no rales.  Abdominal: Soft. Normal appearance and bowel sounds are normal. She exhibits no distension, no fluid wave, no abdominal bruit and no mass. There is no hepatosplenomegaly. There is no tenderness. There is no rebound, no guarding and no CVA tenderness. No hernia.  Genitourinary: No breast swelling, tenderness, discharge or bleeding. Pelvic exam was performed with patient supine.  Refused genital/groin exam  Musculoskeletal:  Bilateral MCP deformity, PIP and DIP deforminty as well. No pain injoints to palpation, no erythema/effusion.  Lymphadenopathy:    She has no cervical adenopathy.    She has no axillary adenopathy.  Neurological: She is alert. She has normal strength. No cranial nerve deficit or sensory deficit.  Skin: Skin is warm, dry and intact. No rash noted.  Psychiatric: Her speech is normal and behavior is normal. Judgment normal. Her mood appears not anxious. Cognition and memory are normal. She does not exhibit a depressed mood.          Assessment & Plan:  The patient's preventative maintenance and recommended screening tests for an annual wellness exam were reviewed in full today. Brought up to date unless services declined.  Counselled on the importance of diet, exercise, and its role in overall health and mortality. The patient's FH and SH was reviewed, including their home life, tobacco status, and drug and alcohol status.   Vaccines: refused flu, uptodate with td, zoster and PCV 13 and 23.  Colon: overdue last one 15 years ago, will do stool cards instead. PAP and DVE not indicated.  Mammo: not indicated, she wants me to do a breast exam today. Nonsmoker Bone density 2002 normal, due for repeat. Refused to repeat today.

## 2015-09-11 NOTE — Assessment & Plan Note (Signed)
Will obtain records from [facility] that stated X-ray showed RA. Offered testing.. Pt will await review of records. Encouraged pt if RA present to consider rheum referral and treatment with [facility] ASAP.

## 2015-09-11 NOTE — Assessment & Plan Note (Signed)
Restart home desensitization exercsie. If not improving consider referral to ENT or for balance retraining.

## 2015-09-11 NOTE — Addendum Note (Signed)
Addended by: Eliezer Lofts E on: 09/11/2015 05:36 PM   Modules accepted: SmartSet

## 2015-09-25 ENCOUNTER — Other Ambulatory Visit (INDEPENDENT_AMBULATORY_CARE_PROVIDER_SITE_OTHER): Payer: Medicare Other

## 2015-09-25 DIAGNOSIS — Z1211 Encounter for screening for malignant neoplasm of colon: Secondary | ICD-10-CM

## 2015-09-25 LAB — FECAL OCCULT BLOOD, IMMUNOCHEMICAL: Fecal Occult Bld: NEGATIVE

## 2015-09-26 ENCOUNTER — Encounter: Payer: Self-pay | Admitting: *Deleted

## 2015-10-02 ENCOUNTER — Telehealth: Payer: Self-pay | Admitting: Family Medicine

## 2015-10-02 NOTE — Telephone Encounter (Signed)
Patient called and asked if you received her records from [facility].

## 2015-10-02 NOTE — Telephone Encounter (Signed)
Can you check to see if we have? I doinot recall seeing them.

## 2015-10-03 NOTE — Telephone Encounter (Signed)
[name] notified records have not been received as of yet.  Record release was only faxed on 09/26/2015.

## 2015-10-14 NOTE — Telephone Encounter (Signed)
Pt called to see if you received records yet?

## 2015-10-15 ENCOUNTER — Telehealth: Payer: Self-pay | Admitting: Family Medicine

## 2015-10-15 DIAGNOSIS — M2550 Pain in unspecified joint: Secondary | ICD-10-CM

## 2015-10-15 NOTE — Telephone Encounter (Signed)
Notified [facility] records reviewed. 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 No erosions noted.  given concern of RA.. I would recommend bilateral hand X-rays and lab eval for rheumatoid arthritis. Let me know if pt agreeable.

## 2015-10-15 NOTE — Telephone Encounter (Addendum)
Called and spoke with medical records at [facility].  She states they are behind but if I refaxed to Record Release Form she will do them now.  Record release faxed to [phone].  Mrs. [name] notified.

## 2015-10-15 NOTE — Telephone Encounter (Signed)
i still don't see them.. Can you make sure I'm not missing them and re-request if needed?

## 2015-10-15 NOTE — Telephone Encounter (Signed)
Ms. Fishell notified as instructed by telephone.  She would like to get the hand x-rays and RA labs.  Appointment scheduled for 10/18/2015 at 9:15 am for lab.  Will forward back to Dr. Diona Browner to place future orders.

## 2015-10-17 NOTE — Addendum Note (Signed)
Addended byEliezer Lofts E on: 10/17/2015 01:19 PM   Modules accepted: Orders

## 2015-10-18 ENCOUNTER — Other Ambulatory Visit (INDEPENDENT_AMBULATORY_CARE_PROVIDER_SITE_OTHER): Payer: Medicare Other

## 2015-10-18 ENCOUNTER — Other Ambulatory Visit: Payer: Self-pay | Admitting: Family Medicine

## 2015-10-18 ENCOUNTER — Ambulatory Visit
Admission: RE | Admit: 2015-10-18 | Discharge: 2015-10-18 | Disposition: A | Discharge status: Home / Self Care | Payer: Medicare Other | Source: Ambulatory Visit | Attending: Family Medicine | Admitting: Family Medicine

## 2015-10-18 ENCOUNTER — Other Ambulatory Visit: Payer: Medicare Other | Admitting: Family Medicine

## 2015-10-18 ENCOUNTER — Ambulatory Visit (INDEPENDENT_AMBULATORY_CARE_PROVIDER_SITE_OTHER)
Admission: RE | Admit: 2015-10-18 | Discharge: 2015-10-18 | Disposition: A | Discharge status: Home / Self Care | Payer: Medicare Other | Source: Ambulatory Visit | Attending: Family Medicine | Admitting: Family Medicine

## 2015-10-18 DIAGNOSIS — M2550 Pain in unspecified joint: Secondary | ICD-10-CM

## 2015-10-18 DIAGNOSIS — M79641 Pain in right hand: Secondary | ICD-10-CM | POA: Diagnosis not present

## 2015-10-18 DIAGNOSIS — M79642 Pain in left hand: Secondary | ICD-10-CM | POA: Diagnosis not present

## 2015-10-18 LAB — RHEUMATOID FACTOR: Rheumatoid fact SerPl-aCnc: <10 [IU]/mL (ref <=14)

## 2015-10-18 LAB — C-REACTIVE PROTEIN: CRP: 0.6 mg/dL (ref 0.5–20.0)

## 2015-10-18 LAB — SEDIMENTATION RATE: Sed Rate: 26 mm/h — ABNORMAL HIGH (ref 0–22)

## 2015-10-21 LAB — ANTI-NUCLEAR AB-TITER (ANA TITER)

## 2015-10-21 LAB — CYCLIC CITRUL PEPTIDE ANTIBODY, IGG: Cyclic Citrullin Peptide Ab: <16 Units

## 2015-10-21 LAB — ANA: ANA: POSITIVE — AB

## 2015-10-22 ENCOUNTER — Telehealth: Payer: Self-pay | Admitting: Family Medicine

## 2015-10-22 DIAGNOSIS — M79643 Pain in unspecified hand: Secondary | ICD-10-CM

## 2015-10-22 DIAGNOSIS — M79673 Pain in unspecified foot: Principal | ICD-10-CM

## 2015-10-22 NOTE — Telephone Encounter (Signed)
-----   Message from Carter Kitten, Gregory sent at 10/22/2015  4:09 PM EST ----- Ms. Conley Canal notified as instructed by telephone.  She is agreeable to the rheumatology referral.

## 2015-11-01 ENCOUNTER — Telehealth: Payer: Self-pay

## 2015-11-01 NOTE — Telephone Encounter (Signed)
Pt request copy of 10/18/15 labs mailed to her verified home address; advised pt done.

## 2015-11-05 ENCOUNTER — Other Ambulatory Visit: Payer: Self-pay | Admitting: Family Medicine

## 2015-11-06 ENCOUNTER — Telehealth: Payer: Self-pay | Admitting: Family Medicine

## 2015-11-06 DIAGNOSIS — R7611 Nonspecific reaction to tuberculin skin test without active tuberculosis: Secondary | ICD-10-CM

## 2015-11-06 NOTE — Telephone Encounter (Signed)
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 Can she come in here for a chest xray to save time instead of waiting a week for the specialist?  cb number is [phone] Thank you

## 2015-11-07 ENCOUNTER — Ambulatory Visit (INDEPENDENT_AMBULATORY_CARE_PROVIDER_SITE_OTHER)
Admission: RE | Admit: 2015-11-07 | Discharge: 2015-11-07 | Disposition: A | Discharge status: Home / Self Care | Payer: Medicare Other | Source: Ambulatory Visit | Attending: Family Medicine | Admitting: Family Medicine

## 2015-11-07 DIAGNOSIS — R7611 Nonspecific reaction to tuberculin skin test without active tuberculosis: Secondary | ICD-10-CM

## 2015-11-07 NOTE — Telephone Encounter (Signed)
[name] notified okay to come to our office for chest x-ray.  Order placed in epic as instructed by Dr. Diona Browner.  She states she will come in the afternoon.

## 2015-11-07 NOTE — Telephone Encounter (Signed)
Yes Okay to order CXR 2 view Dx positive ppd

## 2016-01-24 ENCOUNTER — Ambulatory Visit (INDEPENDENT_AMBULATORY_CARE_PROVIDER_SITE_OTHER): Payer: Medicare Other | Admitting: Family Medicine

## 2016-01-24 ENCOUNTER — Encounter: Payer: Self-pay | Admitting: Family Medicine

## 2016-01-24 VITALS — BP 118/62 | HR 77 | Temp 98.9°F | Ht 63.5 in | Wt 213.1 lb

## 2016-01-24 DIAGNOSIS — H8113 Benign paroxysmal vertigo, bilateral: Secondary | ICD-10-CM

## 2016-01-24 DIAGNOSIS — M05741 Rheumatoid arthritis with rheumatoid factor of right hand without organ or systems involvement: Secondary | ICD-10-CM

## 2016-01-24 DIAGNOSIS — N760 Acute vaginitis: Secondary | ICD-10-CM | POA: Diagnosis not present

## 2016-01-24 DIAGNOSIS — M05742 Rheumatoid arthritis with rheumatoid factor of left hand without organ or systems involvement: Secondary | ICD-10-CM | POA: Diagnosis not present

## 2016-01-24 DIAGNOSIS — M069 Rheumatoid arthritis, unspecified: Secondary | ICD-10-CM | POA: Insufficient documentation

## 2016-01-24 LAB — POCT WET PREP (WET MOUNT)

## 2016-01-24 MED ORDER — FLUCONAZOLE 150 MG PO TABS: 150.0000 mg | ORAL_TABLET | Freq: Once | ORAL | Status: DC

## 2016-01-24 NOTE — Progress Notes (Signed)
   Subjective:    Patient ID: [name], female    DOB: 1941/06/11, 75 y.o.   MRN: [id number]  HPI  75 year  Old female and PSVT with history of HTN presents with 2 new issues.  She reports  1.  Vaginal itching x 6 month off and on, no discharge, no pain, no ulcers. No recent antibiotics. No odor vaginally. She has used OTC hydrocortisone cream, vaseline  2.She has intermittent vertigo when leaning back in bed. Has had similar in past, but   More severe and continuous.. Improved with getting fluid out of ear.  No ear pain. occ allergy symptoms.  Using flonase occ. BP Readings from Last 3 Encounters:  01/24/16 118/62  09/11/15 116/60  11/13/14 122/70    Social History /Family History/Past Medical History reviewed and updated if needed.  Review of Systems  Constitutional: Negative for fatigue.  HENT: Negative for ear pain.   Eyes: Negative for pain.  Respiratory: Negative for cough and shortness of breath.   Cardiovascular: Negative for chest pain.       Objective:   Physical Exam  Constitutional: Vital signs are normal. She appears well-developed and well-nourished. She is cooperative.  Non-toxic appearance. She does not appear ill. No distress.  HENT:  Head: Normocephalic.  Right Ear: Hearing, external ear and ear canal normal. Tympanic membrane is not erythematous, not retracted and not bulging. A middle ear effusion is present.  Left Ear: Hearing, external ear and ear canal normal. Tympanic membrane is not erythematous, not retracted and not bulging.  No middle ear effusion.  Nose: No mucosal edema or rhinorrhea. Right sinus exhibits no maxillary sinus tenderness and no frontal sinus tenderness. Left sinus exhibits no maxillary sinus tenderness and no frontal sinus tenderness.  Mouth/Throat: Uvula is midline, oropharynx is clear and moist and mucous membranes are normal.  Eyes: Conjunctivae, EOM and lids are normal. Pupils are equal, round, and reactive to light.  Lids are everted and swept, no foreign bodies found.  Neck: Trachea normal and normal range of motion. Neck supple. Carotid bruit is not present. No thyroid mass and no thyromegaly present.  Cardiovascular: Normal rate, regular rhythm, S1 normal, S2 normal, normal heart sounds, intact distal pulses and normal pulses.  Exam reveals no gallop and no friction rub.   No murmur heard. Pulmonary/Chest: Effort normal and breath sounds normal. No tachypnea. No respiratory distress. She has no decreased breath sounds. She has no wheezes. She has no rhonchi. She has no rales.  Abdominal: Soft. Normal appearance and bowel sounds are normal. There is no tenderness.  Genitourinary: There is rash and tenderness on the right labia. There is no lesion or injury on the right labia. There is rash and tenderness on the left labia. There is no lesion or injury on the left labia. There is erythema and tenderness in the vagina. No vaginal discharge found.  Neurological: She is alert.  Skin: Skin is warm, dry and intact. No rash noted.  Psychiatric: Her speech is normal and behavior is normal. Judgment and thought content normal. Her mood appears not anxious. Cognition and memory are normal. She does not exhibit a depressed mood.          Assessment & Plan:

## 2016-01-24 NOTE — Patient Instructions (Signed)
Take diflucan x 1 , repeat in 3 days if not improved. Only wash with warm water, no wipes, just gentle toilet tissue.   Start back on flonase 2 sprays per nostril daily for fluid in ears. Start home vertigo exercises.  Call if not improving as expected.

## 2016-01-24 NOTE — Assessment & Plan Note (Signed)
Discussed treatment.. She plans on proceeding with methotrexate initiation. Followed by Rheum.

## 2016-01-24 NOTE — Progress Notes (Signed)
Pre visit review using our clinic review tool, if applicable. No additional management support is needed unless otherwise documented below in the visit note. 

## 2016-01-24 NOTE — Assessment & Plan Note (Signed)
ewet prep inadequate... Most likely yeast infection. Treat with diflucan. Avoid irritants.

## 2016-01-24 NOTE — Assessment & Plan Note (Signed)
Restart desensitizing exercises. Restart flonase to treat mild eustacian tube dysfunction.

## 2016-01-28 ENCOUNTER — Telehealth: Payer: Self-pay | Admitting: Family Medicine

## 2016-01-28 NOTE — Telephone Encounter (Signed)
Pt called wanting labs from [facility] Dr. Pearline Cables to be completed at our office.  Checked with lab and clinical, we do not pull labs for an outside provider.  Pt understood.

## 2016-01-29 ENCOUNTER — Telehealth: Payer: Self-pay

## 2016-01-29 NOTE — Telephone Encounter (Signed)
Pt left v/m; pt seen 01/24/16; pt is going to take the 2nd diflucan; pt is to start methotrexate for RA today and pt wants to know if can take methotrexate and diflucan together. Pt request cb.

## 2016-01-29 NOTE — Telephone Encounter (Signed)
There is no interaction between diflucan and methotrexate per Allie Bossier, NP.  Ms. Meo notified okay to take together.

## 2016-05-07 ENCOUNTER — Other Ambulatory Visit: Payer: Self-pay | Admitting: Family Medicine

## 2016-09-09 ENCOUNTER — Telehealth: Payer: Self-pay | Admitting: *Deleted

## 2016-09-09 NOTE — Telephone Encounter (Signed)
TC to pt to schedule AWV and CPE. 09/09/16 mc

## 2016-09-23 ENCOUNTER — Ambulatory Visit (INDEPENDENT_AMBULATORY_CARE_PROVIDER_SITE_OTHER): Payer: Medicare Other | Admitting: Family Medicine

## 2016-09-23 ENCOUNTER — Encounter: Payer: Self-pay | Admitting: Family Medicine

## 2016-09-23 VITALS — BP 122/72 | HR 88 | Temp 100.6°F | Wt 200.0 lb

## 2016-09-23 DIAGNOSIS — R197 Diarrhea, unspecified: Secondary | ICD-10-CM

## 2016-09-23 DIAGNOSIS — J111 Influenza due to unidentified influenza virus with other respiratory manifestations: Secondary | ICD-10-CM

## 2016-09-23 DIAGNOSIS — M05742 Rheumatoid arthritis with rheumatoid factor of left hand without organ or systems involvement: Secondary | ICD-10-CM | POA: Diagnosis not present

## 2016-09-23 DIAGNOSIS — M05741 Rheumatoid arthritis with rheumatoid factor of right hand without organ or systems involvement: Secondary | ICD-10-CM

## 2016-09-23 LAB — CBC WITH DIFFERENTIAL/PLATELET
BASOS PCT: 0.6 % (ref 0.0–3.0)
Basophils Absolute: 0 10*3/uL (ref 0.0–0.1)
EOS PCT: 1.4 % (ref 0.0–5.0)
Eosinophils Absolute: 0.1 10*3/uL (ref 0.0–0.7)
HCT: 38.8 % (ref 36.0–46.0)
Hemoglobin: 13.1 g/dL (ref 12.0–15.0)
LYMPHS ABS: 2.2 10*3/uL (ref 0.7–4.0)
Lymphocytes Relative: 26.3 % (ref 12.0–46.0)
MCHC: 33.8 g/dL (ref 30.0–36.0)
MCV: 88.2 fl (ref 78.0–100.0)
MONO ABS: 1.1 10*3/uL — AB (ref 0.1–1.0)
Monocytes Relative: 13.5 % — ABNORMAL HIGH (ref 3.0–12.0)
NEUTROS ABS: 4.8 10*3/uL (ref 1.4–7.7)
NEUTROS PCT: 58.2 % (ref 43.0–77.0)
PLATELETS: 200 10*3/uL (ref 150.0–400.0)
RBC: 4.4 Mil/uL (ref 3.87–5.11)
RDW: 15.4 % (ref 11.5–15.5)
WBC: 8.3 10*3/uL (ref 4.0–10.5)

## 2016-09-23 LAB — COMPREHENSIVE METABOLIC PANEL
ALBUMIN: 4 g/dL (ref 3.5–5.2)
ALT: 32 U/L (ref 0–35)
AST: 44 U/L — ABNORMAL HIGH (ref 0–37)
Alkaline Phosphatase: 78 U/L (ref 39–117)
BUN: 10 mg/dL (ref 6–23)
CALCIUM: 8.7 mg/dL (ref 8.4–10.5)
CHLORIDE: 97 meq/L (ref 96–112)
CO2: 31 mEq/L (ref 19–32)
CREATININE: 0.74 mg/dL (ref 0.40–1.20)
GFR: 80.78 mL/min (ref >60.00)
GLUCOSE: 107 mg/dL — AB (ref 70–99)
Potassium: 3.4 mEq/L — ABNORMAL LOW (ref 3.5–5.1)
Sodium: 138 mEq/L (ref 135–145)
TOTAL PROTEIN: 7.5 g/dL (ref 6.0–8.3)
Total Bilirubin: 0.4 mg/dL (ref 0.2–1.2)

## 2016-09-23 LAB — POC INFLUENZA A&B (BINAX/QUICKVUE)
INFLUENZA A, POC: POSITIVE — AB
Influenza B, POC: NEGATIVE

## 2016-09-23 MED ORDER — OSELTAMIVIR PHOSPHATE 75 MG PO CAPS: 75.0000 mg | ORAL_CAPSULE | Freq: Two times a day (BID) | ORAL | 0 refills | Status: DC

## 2016-09-23 NOTE — Progress Notes (Signed)
Pre visit review using our clinic review tool, if applicable. No additional management support is needed unless otherwise documented below in the visit note. 

## 2016-09-23 NOTE — Progress Notes (Signed)
BP 122/72   Pulse 88   Temp (!) 100.6 F (38.1 C) (Oral)   Wt 200 lb (90.7 kg)   SpO2 92%   BMI 34.87 kg/m    CC: URI, diarrhea Subjective:    Patient ID: [name], female    DOB: Feb 16, 1941, 75 y.o.   MRN: [id number]  HPI: [name] is a 75 y.o. female presenting on 09/23/2016 for URI (cough) and Diarrhea (x 1 month)   Off and on URI sxs over the past month. Then 3 days ago she and her husband got acutely ill again with productive cough. Low grade fevers. Head and chest congestion. Some R sinus pain/pressure. + body aches. ++ hoarseness. Some wheezing and mild dyspnea present. Decreased appetite.   No ear or tooth pain, ST, PNdrainage.   RA on leflunomide - denies significant diarrhea since on this med until now.   Last month she also had GI illness with nausea/vomiting x1 and diarrhea which has persisted (2x/day, watery). Last night had episode of uncontrolled diarrhea in bed.   Taking mucinex and tessalon perls. Last night took 2 immodium.  No recent travel.  Non smoker No h/o asthma. Did not receive flu shot.  Husband sick as well with URI sxs.   Pt requests rpt labs today - states she was scheduled to see rheum today but came here instead. No records of prior labs available.   Relevant past medical, surgical, family and social history reviewed and updated as indicated. Interim medical history since our last visit reviewed. Allergies and medications reviewed and updated. Current Outpatient Prescriptions on File Prior to Visit  Medication Sig  . hydrochlorothiazide (HYDRODIURIL) 25 MG tablet TAKE 1 TABLET BY MOUTH ONCE A DAY   No current facility-administered medications on file prior to visit.     Review of Systems Per HPI unless specifically indicated in ROS section     Objective:    BP 122/72   Pulse 88   Temp (!) 100.6 F (38.1 C) (Oral)   Wt 200 lb (90.7 kg)   SpO2 92%   BMI 34.87 kg/m   Wt Readings from Last 3 Encounters:    09/23/16 200 lb (90.7 kg)  01/24/16 213 lb 1.9 oz (96.7 kg)  09/11/15 209 lb 8 oz (95 kg)    Physical Exam  Constitutional: She appears well-developed and well-nourished. No distress.  HENT:  Head: Normocephalic and atraumatic.  Right Ear: Hearing, tympanic membrane, external ear and ear canal normal.  Left Ear: Hearing, tympanic membrane, external ear and ear canal normal.  Nose: Mucosal edema present. No rhinorrhea. Right sinus exhibits no maxillary sinus tenderness and no frontal sinus tenderness. Left sinus exhibits no maxillary sinus tenderness and no frontal sinus tenderness.  Mouth/Throat: Uvula is midline, oropharynx is clear and moist and mucous membranes are normal. No oropharyngeal exudate, posterior oropharyngeal edema, posterior oropharyngeal erythema or tonsillar abscesses.  Hoarse voice present  Eyes: Conjunctivae and EOM are normal. Pupils are equal, round, and reactive to light. No scleral icterus.  Neck: Normal range of motion. Neck supple. No thyromegaly present.  Cardiovascular: Normal rate, regular rhythm, normal heart sounds and intact distal pulses.   No murmur heard. Pulmonary/Chest: Effort normal and breath sounds normal. No respiratory distress. She has no wheezes. She has no rales.  Lungs overall clear  Abdominal: Soft. Bowel sounds are normal. She exhibits no distension and no mass. There is no tenderness. There is no rebound and no guarding.  Musculoskeletal: She exhibits  no edema.  Lymphadenopathy:    She has no cervical adenopathy.  Skin: Skin is warm and dry. No rash noted.  Psychiatric: She has a normal mood and affect.  Nursing note and vitals reviewed.     Assessment & Plan:   Problem List Items Addressed This Visit    Acute diarrhea    Ongoing over 3 weeks - check infectious diarrhea panel.  Will update with results.      Relevant Orders   Gastrointestinal Panel by PCR , Stool   Comprehensive metabolic panel   CBC with Differential/Platelet    Influenza - Primary    Flu swab positive Although >48 hours since sxs started, will treat with tamiflu given comorbidities. Supportive care as per instructions.  Discussed contagiousness of illness      Relevant Medications   oseltamivir (TAMIFLU) 75 MG capsule   Rheumatoid arthritis ([place])    On leflunomide followed by rheum.  Will request latest labs from rheum.      Relevant Medications   LEFLUNOMIDE PO   Other Relevant Orders   Comprehensive metabolic panel   CBC with Differential/Platelet       Follow up plan: Return if symptoms worsen or fail to improve.  Ria Bush, MD

## 2016-09-23 NOTE — Assessment & Plan Note (Addendum)
Flu swab positive Although >48 hours since sxs started, will treat with tamiflu given comorbidities. Supportive care as per instructions.  Discussed contagiousness of illness

## 2016-09-23 NOTE — Patient Instructions (Addendum)
Flu swab today - positive Treat with tamiflu sent to pharmacy. We will request latest labs from rheum.  Pass by lab for stool test looking for bacterial infection and labwork.  Push fluids and plenty of rest. Please return if you are not improving as expected, or if you have high fevers (>101.5) or difficulty swallowing or worsening productive cough. Call clinic with questions.  Good to see you today. I hope you start feeling better soon.    Influenza, Adult Influenza, more commonly known as "the flu," is a viral infection that primarily affects the respiratory tract. The respiratory tract includes organs that help you breathe, such as the lungs, nose, and throat. The flu causes many common cold symptoms, as well as a high fever and body aches. The flu spreads easily from person to person (is contagious). Getting a flu shot (influenza vaccination) every year is the best way to prevent influenza. What are the causes? Influenza is caused by a virus. You can catch the virus by:  Breathing in droplets from an infected person's cough or sneeze.  Touching something that was recently contaminated with the virus and then touching your mouth, nose, or eyes. What increases the risk? The following factors may make you more likely to get the flu:  Not cleaning your hands frequently with soap and water or alcohol-based hand sanitizer.  Having close contact with many people during cold and flu season.  Touching your mouth, eyes, or nose without washing or sanitizing your hands first.  Not drinking enough fluids or not eating a healthy diet.  Not getting enough sleep or exercise.  Being under a high amount of stress.  Not getting a yearly (annual) flu shot. You may be at a higher risk of complications from the flu, such as a severe lung infection (pneumonia), if you:  Are over the age of 3.  Are pregnant.  Have a weakened disease-fighting system (immune system). You may have a weakened  immune system if you:  Have HIV or AIDS.  Are undergoing chemotherapy.  Aretaking medicines that reduce the activity of (suppress) the immune system.  Have a long-term (chronic) illness, such as heart disease, kidney disease, diabetes, or lung disease.  Have a liver disorder.  Are obese.  Have anemia. What are the signs or symptoms? Symptoms of this condition typically last 4-10 days and may include:  Fever.  Chills.  Headache, body aches, or muscle aches.  Sore throat.  Cough.  Runny or congested nose.  Chest discomfort and cough.  Poor appetite.  Weakness or tiredness (fatigue).  Dizziness.  Nausea or vomiting. How is this diagnosed? This condition may be diagnosed based on your medical history and a physical exam. Your health care provider may do a nose or throat swab test to confirm the diagnosis. How is this treated? If influenza is detected early, you can be treated with antiviral medicine that can reduce the length of your illness and the severity of your symptoms. This medicine may be given by mouth (orally) or through an IV tube that is inserted in one of your veins. The goal of treatment is to relieve symptoms by taking care of yourself at home. This may include taking over-the-counter medicines, drinking plenty of fluids, and adding humidity to the air in your home. In some cases, influenza goes away on its own. Severe influenza or complications from influenza may be treated in a hospital. Follow these instructions at home:  Take over-the-counter and prescription medicines only as told  by your health care provider.  Use a cool mist humidifier to add humidity to the air in your home. This can make breathing easier.  Rest as needed.  Drink enough fluid to keep your urine clear or pale yellow.  Cover your mouth and nose when you cough or sneeze.  Wash your hands with soap and water often, especially after you cough or sneeze. If soap and water are not  available, use hand sanitizer.  Stay home from work or school as told by your health care provider. Unless you are visiting your health care provider, try to avoid leaving home until your fever has been gone for 24 hours without the use of medicine.  Keep all follow-up visits as told by your health care provider. This is important. How is this prevented?  Getting an annual flu shot is the best way to avoid getting the flu. You may get the flu shot in late summer, fall, or winter. Ask your health care provider when you should get your flu shot.  Wash your hands often or use hand sanitizer often.  Avoid contact with people who are sick during cold and flu season.  Eat a healthy diet, drink plenty of fluids, get enough sleep, and exercise regularly. Contact a health care provider if:  You develop new symptoms.  You have:  Chest pain.  Diarrhea.  A fever.  Your cough gets worse.  You produce more mucus.  You feel nauseous or you vomit. Get help right away if:  You develop shortness of breath or difficulty breathing.  Your skin or nails turn a bluish color.  You have severe pain or stiffness in your neck.  You develop a sudden headache or sudden pain in your face or ear.  You cannot stop vomiting. This information is not intended to replace advice given to you by your health care provider. Make sure you discuss any questions you have with your health care provider. Document Released: 09/18/2000 Document Revised: 02/27/2016 Document Reviewed: 07/16/2015 Elsevier Interactive Patient Education  2017 [employer].

## 2016-09-23 NOTE — Assessment & Plan Note (Signed)
Ongoing over 3 weeks - check infectious diarrhea panel.  Will update with results.

## 2016-09-23 NOTE — Assessment & Plan Note (Addendum)
On leflunomide followed by rheum.  Will request latest labs from rheum.

## 2016-09-24 ENCOUNTER — Encounter: Payer: Self-pay | Admitting: *Deleted

## 2016-09-24 ENCOUNTER — Telehealth: Payer: Self-pay

## 2016-09-24 DIAGNOSIS — R197 Diarrhea, unspecified: Secondary | ICD-10-CM

## 2016-09-24 DIAGNOSIS — J111 Influenza due to unidentified influenza virus with other respiratory manifestations: Secondary | ICD-10-CM

## 2016-09-24 MED ORDER — HYDROCODONE-HOMATROPINE 5-1.5 MG/5ML PO SYRP: 5.0000 mL | ORAL_SOLUTION | Freq: Two times a day (BID) | ORAL | 0 refills | Status: DC | PRN

## 2016-09-24 NOTE — Telephone Encounter (Signed)
Rx placed at front for pick up.

## 2016-09-24 NOTE — Telephone Encounter (Signed)
Spoke with patient. She's willing to try hydrocodone cough syrup. She will pick Rx up this afternoon. Rx printed and in Kim's box. plz fax latest labs attn Harrel Carina PA at [facility] rheumatology.

## 2016-09-24 NOTE — Telephone Encounter (Signed)
Pt left v/m and pt was seen 09/23/16; pt was offered cough med with codeine and pt did not think needed; after last night and pt could not sleep due to coughing pt request cough med. [employer]. Pt request cb.

## 2016-09-29 ENCOUNTER — Telehealth: Payer: Self-pay

## 2016-09-29 NOTE — Telephone Encounter (Signed)
Pt left v/m; pt was seen 09/23/16 with the flu and pt request cb with stool sample results. Also pt has finished meds given for the flu. Pt does not have fever and request cb with what else to expect after having the flu.

## 2016-09-29 NOTE — Telephone Encounter (Signed)
Pt called about labs, please call back at [phone] Thanks

## 2016-09-29 NOTE — Telephone Encounter (Signed)
Stool test not back yet. Should slowly improve from flu over time. Usually 7-10 days to fully resolve. Cough can last a few weeks to go fully away. Let us know if ongoing symptoms.

## 2016-09-29 NOTE — Telephone Encounter (Signed)
Patient notified and verbalized understanding. 

## 2016-10-06 ENCOUNTER — Telehealth (INDEPENDENT_AMBULATORY_CARE_PROVIDER_SITE_OTHER): Payer: Medicare Other

## 2016-10-06 ENCOUNTER — Other Ambulatory Visit (INDEPENDENT_AMBULATORY_CARE_PROVIDER_SITE_OTHER): Payer: Medicare Other

## 2016-10-06 ENCOUNTER — Telehealth: Payer: Self-pay | Admitting: Family Medicine

## 2016-10-06 ENCOUNTER — Ambulatory Visit (INDEPENDENT_AMBULATORY_CARE_PROVIDER_SITE_OTHER): Payer: Medicare Other

## 2016-10-06 VITALS — BP 100/70 | HR 75 | Temp 98.8°F | Ht 63.5 in | Wt 199.2 lb

## 2016-10-06 DIAGNOSIS — Z0000 Encounter for general adult medical examination without abnormal findings: Secondary | ICD-10-CM

## 2016-10-06 DIAGNOSIS — R7303 Prediabetes: Secondary | ICD-10-CM | POA: Diagnosis not present

## 2016-10-06 DIAGNOSIS — E7849 Other hyperlipidemia: Secondary | ICD-10-CM

## 2016-10-06 DIAGNOSIS — E784 Other hyperlipidemia: Secondary | ICD-10-CM | POA: Diagnosis not present

## 2016-10-06 LAB — LIPID PANEL
Cholesterol: 184 mg/dL (ref 0–200)
HDL: 42.6 mg/dL (ref >39.00)
LDL Cholesterol: 123 mg/dL — ABNORMAL HIGH (ref 0–99)
NONHDL: 141.43
Total CHOL/HDL Ratio: 4
Triglycerides: 92 mg/dL (ref 0.0–149.0)
VLDL: 18.4 mg/dL (ref 0.0–40.0)

## 2016-10-06 LAB — GASTROINTESTINAL PATHOGEN PANEL PCR
C. DIFFICILE TOX A/B, PCR: NOT DETECTED
CAMPYLOBACTER, PCR: NOT DETECTED
Cryptosporidium, PCR: NOT DETECTED
E COLI (STEC) STX1/STX2, PCR: NOT DETECTED
E coli (ETEC) LT/ST PCR: NOT DETECTED
E coli 0157, PCR: NOT DETECTED
Giardia lamblia, PCR: NOT DETECTED
Norovirus, PCR: NOT DETECTED
ROTAVIRUS, PCR: NOT DETECTED
SALMONELLA, PCR: NOT DETECTED
SHIGELLA, PCR: NOT DETECTED

## 2016-10-06 LAB — HEMOGLOBIN A1C: HEMOGLOBIN A1C: 5.9 % (ref 4.6–6.5)

## 2016-10-06 NOTE — Patient Instructions (Signed)
[name] , Thank you for taking time to come for your Medicare Wellness Visit. I appreciate your ongoing commitment to your health goals. Please review the following plan we discussed and let me know if I can assist you in the future.   These are the goals we discussed: Goals    . pain management          Starting 10/06/2016, I will continue to take RA medication as prescribed in an effort to keep pain levels below 3/10.        This is a list of the screening recommended for you and due dates:  Health Maintenance  Topic Date Due  . Flu Shot  01/02/2017*  . Tetanus Vaccine  01/03/2018  . DEXA scan (bone density measurement)  Completed  . Shingles Vaccine  Completed  . Pneumonia vaccines  Completed  *Topic was postponed. The date shown is not the original due date.   Preventive Care for Adults  A healthy lifestyle and preventive care can promote health and wellness. Preventive health guidelines for adults include the following key practices.  . A routine yearly physical is a good way to check with your health care provider about your health and preventive screening. It is a chance to share any concerns and updates on your health and to receive a thorough exam.  . Visit your dentist for a routine exam and preventive care every 6 months. Brush your teeth twice a day and floss once a day. Good oral hygiene prevents tooth decay and gum disease.  . The frequency of eye exams is based on your age, health, family medical history, use  of contact lenses, and other factors. Follow your health care provider's ecommendations for frequency of eye exams.  . Eat a healthy diet. Foods like vegetables, fruits, whole grains, low-fat dairy products, and lean protein foods contain the nutrients you need without too many calories. Decrease your intake of foods high in solid fats, added sugars, and salt. Eat the right amount of calories for you. Get information about a proper diet from your health care  provider, if necessary.  . Regular physical exercise is one of the most important things you can do for your health. Most adults should get at least 150 minutes of moderate-intensity exercise (any activity that increases your heart rate and causes you to sweat) each week. In addition, most adults need muscle-strengthening exercises on 2 or more days a week.  Silver Sneakers may be a benefit available to you. To determine eligibility, you may visit the website: www.silversneakers.com or contact program at (910)598-0399 Mon-Fri between 8AM-8PM.   . Maintain a healthy weight. The body mass index (BMI) is a screening tool to identify possible weight problems. It provides an estimate of body fat based on height and weight. Your health care provider can find your BMI and can help you achieve or maintain a healthy weight.   For adults 20 years and older: ? A BMI below 18.5 is considered underweight. ? A BMI of 18.5 to 24.9 is normal. ? A BMI of 25 to 29.9 is considered overweight. ? A BMI of 30 and above is considered obese.   . Maintain normal blood lipids and cholesterol levels by exercising and minimizing your intake of saturated fat. Eat a balanced diet with plenty of fruit and vegetables. Blood tests for lipids and cholesterol should begin at age 70 and be repeated every 5 years. If your lipid or cholesterol levels are high, you are over  50, or you are at high risk for heart disease, you may need your cholesterol levels checked more frequently. Ongoing high lipid and cholesterol levels should be treated with medicines if diet and exercise are not working.  . If you smoke, find out from your health care provider how to quit. If you do not use tobacco, please do not start.  . If you choose to drink alcohol, please do not consume more than 2 drinks per day. One drink is considered to be 12 ounces (355 mL) of beer, 5 ounces (148 mL) of wine, or 1.5 ounces (44 mL) of liquor.  . If you are 46-79 years  old, ask your health care provider if you should take aspirin to prevent strokes.  . Use sunscreen. Apply sunscreen liberally and repeatedly throughout the day. You should seek shade when your shadow is shorter than you. Protect yourself by wearing long sleeves, pants, a wide-brimmed hat, and sunglasses year round, whenever you are outdoors.  . Once a month, do a whole body skin exam, using a mirror to look at the skin on your back. Tell your health care provider of new moles, moles that have irregular borders, moles that are larger than a pencil eraser, or moles that have changed in shape or color.

## 2016-10-06 NOTE — Progress Notes (Signed)
Subjective:   [name] is a 76 y.o. female who presents for Medicare Annual (Subsequent) preventive examination.  Review of Systems:  N/A Cardiac Risk Factors include: advanced age (>8men, >48 women);obesity (BMI >30kg/m2);dyslipidemia;hypertension     Objective:     Vitals: BP 100/70 (BP Location: Left Arm, Patient Position: Sitting, Cuff Size: Normal)   Pulse 75   Temp 98.8 F (37.1 C) (Oral)   Ht 5' 3.5" (1.613 m) Comment: no shoes  Wt 199 lb 4 oz (90.4 kg)   SpO2 93%   BMI 34.74 kg/m   Body mass index is 34.74 kg/m.   Tobacco History  Smoking Status  . Former Smoker  . Packs/day: 1.00  . Years: 5.00  Smokeless Tobacco  . Never Used     Counseling given: No   Past Medical History:  Diagnosis Date  . Dysrhythmia    PSVT/ palpitations-  controlled with prn Inderal  . GERD (gastroesophageal reflux disease)   . Hyperlipidemia    statin intolerant  . Hypertension    eccho 4/13 EPIC  . Migraines   . Obesity   . Osteoarthritis   . Phlebitis of leg, right, superficial 1986/1989   x 2   Past Surgical History:  Procedure Laterality Date  . CARDIAC CATHETERIZATION  1990s   negative  . CARDIOVASCULAR STRESS TEST  2008   NML  . CARDIOVASCULAR STRESS TEST  02/02/2007   EF 70%, NO ISCHEMIA  . DILATION AND CURETTAGE, DIAGNOSTIC / THERAPEUTIC  2008  . ROTATOR CUFF REPAIR  1999   Right, left in 2002  . 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 US ECHOCARDIOGRAPHY  02/07/2007   EF 55-60%   Family History  Problem Relation Age of Onset  . Heart attack Father 42    PUD  . Hypertension Father   . Lung cancer Brother   . Heart defect Sister   . Hypertension Mother   . Coronary artery disease      Female 1st degree relative <50   History  Sexual Activity  . Sexual activity: Yes     Outpatient Encounter Prescriptions as of 10/06/2016  Medication Sig  . hydrochlorothiazide (HYDRODIURIL) 25 MG tablet TAKE 1 TABLET BY MOUTH ONCE A DAY  . LEFLUNOMIDE PO Take by mouth daily.  . [DISCONTINUED] HYDROcodone-homatropine (HYCODAN) 5-1.5 MG/5ML syrup Take 5 mLs by mouth 2 (two) times daily as needed for cough (sedation precautions).  . [DISCONTINUED] oseltamivir (TAMIFLU) 75 MG capsule Take 1 capsule (75 mg total) by mouth 2 (two) times daily.   No facility-administered encounter medications on file as of 10/06/2016.     Activities of Daily Living In your present state of health, do you have any difficulty performing the following activities: 10/06/2016  Hearing? N  Vision? N  Difficulty concentrating or making decisions? N  Walking or climbing stairs? Y  Dressing or bathing? N  Doing errands, shopping? N  Preparing Food and eating ? N  Using the Toilet? N  In the past six months, have you accidently leaked urine? Y  Do you have problems with loss of bowel control? N  Managing your Medications? N  Managing your Finances? N  Housekeeping or managing your Housekeeping? N  Some recent data might be hidden    Patient Care Team: [name],  MD as PCP - General Rosita Kea, PA-C as Physician Assistant (Rheumatology)    Assessment:     Hearing Screening   125Hz  250Hz  500Hz  1000Hz  2000Hz  3000Hz  4000Hz  6000Hz  8000Hz   Right ear:   40 0 40  40    Left ear:   40 0 40  0    Vision Screening Comments: Last vision exam in January 2017 with Dr. Phineas Douglas   Exercise Activities and Dietary recommendations Current Exercise Habits: Home exercise routine, Type of exercise: walking, Time (Minutes): 30, Frequency (Times/Week): 7, Weekly Exercise (Minutes/Week): 210, Intensity: Mild, Exercise limited by: None identified  Goals    . pain management          Starting 10/06/2016, I will continue to take RA medication as prescribed in an effort to keep pain levels below 3/10.        Fall Risk Fall Risk  10/06/2016 09/11/2015  Falls in the past year? No No   Depression Screen PHQ 2/9 Scores 10/06/2016 09/11/2015  PHQ - 2 Score 0 0     Cognitive Function MMSE - Mini Mental State Exam 10/06/2016  Orientation to time 5  Orientation to Place 5  Registration 3  Attention/ Calculation 0  Recall 3  Language- name 2 objects 0  Language- repeat 1  Language- follow 3 step command 3  Language- read & follow direction 0  Write a sentence 0  Copy design 0  Total score 20       PLEASE NOTE: A Mini-Cog screen was completed. Maximum score is 20. A value of 0 denotes this part of Folstein MMSE was not completed or the patient failed this part of the Mini-Cog screening.   Mini-Cog Screening Orientation to Time - Max 5 pts Orientation to Place - Max 5 pts Registration - Max 3 pts Recall - Max 3 pts Language Repeat - Max 1 pts Language Follow 3 Step Command - Max 3 pts   Immunization History  Administered Date(s) Administered  . Pneumococcal Conjugate-13 07/10/2014  . Pneumococcal Polysaccharide-23 01/04/2008  . Td 10/05/1996, 01/04/2008  . Zoster 02/01/2008   Screening Tests Health Maintenance  Topic Date Due  . INFLUENZA VACCINE  01/02/2017 (Originally 05/05/2016)  . TETANUS/TDAP  01/03/2018  . DEXA SCAN  Completed  . ZOSTAVAX  Completed  . PNA vac Low Risk Adult  Completed      Plan:     I have personally reviewed and addressed the Medicare Annual Wellness questionnaire and have noted the following in the patient's chart:  A. Medical and social history B. Use of alcohol, tobacco or illicit drugs  C. Current medications and supplements D. Functional ability and status E.  Nutritional status F.  Physical activity G. Advance directives H. List of other physicians I.  Hospitalizations, surgeries, and ER visits in previous 12 months J.  [place] to include hearing, vision, cognitive, depression L. Referrals and appointments - none  In  addition, I have reviewed and discussed with patient certain preventive protocols, quality metrics, and best practice recommendations. A written personalized care plan for preventive services as well as general preventive health recommendations were provided to patient.  See attached scanned questionnaire for additional information.   Signed,   Lindell Noe, MHA, BS, LPN Health Coach

## 2016-10-06 NOTE — Telephone Encounter (Signed)
Pt called in about her lab work scheduled for today.  We did not receive her message until 12:05pm and her appt was at 10:45.

## 2016-10-06 NOTE — Progress Notes (Signed)
I reviewed health advisor's note, was available for consultation, and agree with documentation and plan.  Valissa Lyvers, MD [facility] [place]  

## 2016-10-06 NOTE — Progress Notes (Signed)
Pre visit review using our clinic review tool, if applicable. No additional management support is needed unless otherwise documented below in the visit note. 

## 2016-10-06 NOTE — Progress Notes (Signed)
PCP notes:   Health maintenance:  Flu vaccine - pt will discuss with PCP at next appt  Abnormal screenings:   Hearing - failed  Patient concerns:   None  Nurse concerns:  None  Next PCP appt:   10/15/16 @ 1000

## 2016-10-06 NOTE — Telephone Encounter (Signed)
Verbal orders given for labs.

## 2016-10-07 ENCOUNTER — Ambulatory Visit: Payer: Medicare Other

## 2016-10-15 ENCOUNTER — Encounter: Payer: Self-pay | Admitting: Family Medicine

## 2016-10-15 ENCOUNTER — Ambulatory Visit (INDEPENDENT_AMBULATORY_CARE_PROVIDER_SITE_OTHER): Payer: Medicare Other | Admitting: Family Medicine

## 2016-10-15 VITALS — BP 100/60 | HR 80 | Temp 98.7°F | Ht 63.5 in | Wt 199.2 lb

## 2016-10-15 DIAGNOSIS — R7303 Prediabetes: Secondary | ICD-10-CM

## 2016-10-15 DIAGNOSIS — I10 Essential (primary) hypertension: Secondary | ICD-10-CM

## 2016-10-15 DIAGNOSIS — E2839 Other primary ovarian failure: Secondary | ICD-10-CM

## 2016-10-15 DIAGNOSIS — G8929 Other chronic pain: Secondary | ICD-10-CM

## 2016-10-15 DIAGNOSIS — E782 Mixed hyperlipidemia: Secondary | ICD-10-CM

## 2016-10-15 DIAGNOSIS — M05742 Rheumatoid arthritis with rheumatoid factor of left hand without organ or systems involvement: Secondary | ICD-10-CM

## 2016-10-15 DIAGNOSIS — M545 Low back pain, unspecified: Secondary | ICD-10-CM

## 2016-10-15 DIAGNOSIS — Z0001 Encounter for general adult medical examination with abnormal findings: Secondary | ICD-10-CM | POA: Diagnosis not present

## 2016-10-15 DIAGNOSIS — M05741 Rheumatoid arthritis with rheumatoid factor of right hand without organ or systems involvement: Secondary | ICD-10-CM

## 2016-10-15 NOTE — Assessment & Plan Note (Signed)
Stable on no pain meds.

## 2016-10-15 NOTE — Patient Instructions (Addendum)
Call Marella Chimes for RA flare recommendations.  Can try probiotic for irregular stools.  Plan on cologuard for colon cancer testing.  Please stop at the front desk to set up referral.

## 2016-10-15 NOTE — Assessment & Plan Note (Signed)
12% CVD risk.. Should be on high dose statin but pt intolerant of statins as a class. Encouraged exercise, weight loss, healthy eating habits.

## 2016-10-15 NOTE — Assessment & Plan Note (Signed)
Stable control on low carb diet. 

## 2016-10-15 NOTE — Progress Notes (Signed)
Pre visit review using our clinic review tool, if applicable. No additional management support is needed unless otherwise documented below in the visit note. 

## 2016-10-15 NOTE — Progress Notes (Signed)
Subjective:    Patient ID: [name], female    DOB: October 17, 1940, 76 y.o.   MRN: [id number]  HPI  76 year old female presents for PART 2 of AMW. The patient saw Candis Musa, LPN for medicare wellness. Note reviewed in detail and important notes copied below.  Health maintenance: Flu vaccine - pt will discuss with PCP at next appt Abnormal screenings:  Hearing - failed Patient concerns:  None Nurse concerns: None   She is currently treated for RA.  Followed by Marella Chimes PA.  On [place].. Has not helped much in last 5 months.  Now  Monday awoke with swelling , pain and decreased ROM in right wrist and elbow. No recent injury.  Tylenol,NSAD, diclofenac did not help.  SE to methotrexatate.  She has been feeling anxious and nervous in last few months.  Ever since she had flu 12/20... Now resolved. Intermittent diarrhea when stressed out, much better than when she had the flu.  ARAVA.Marland Kitchen Has this as common SE as well.  Neg GI panel 12/21. No blood in stool. Able to sleep at night No depression.  Elevated Cholesterol:  On no med. 12% 10 year risk for CVD per AHA risk calculator. INtolerant of statin as a c lass.. Ever red yeast rice. Lab Results  Component Value Date   CHOL 184 10/06/2016   HDL 42.60 10/06/2016   LDLCALC 123 (H) 10/06/2016   LDLDIRECT 151.5 08/17/2012   TRIG 92.0 10/06/2016   CHOLHDL 4 10/06/2016  Using medications without problems: Muscle aches:  Diet compliance: moderate Exercise: walking daily, short time limited due to back. Other complaints:  Hypertension:    Good control on HCTZ. BP Readings from Last 3 Encounters:  10/15/16 100/60  10/06/16 100/70  09/23/16 122/72  Using medication without problems or lightheadedness: none Chest pain with exertion:none Edema:occ Short of breath: none Average home BPs: stable Other issues:   Prediabetes:  Lab Results  Component Value Date   HGBA1C 5.9 10/06/2016    Social History  /Family History/Past Medical History reviewed and updated if needed.   Review of Systems  Constitutional: Negative for fatigue and fever.  HENT: Negative for ear pain.   Eyes: Negative for pain.  Respiratory: Negative for shortness of breath.   Cardiovascular: Negative for chest pain.  Gastrointestinal: Negative for abdominal pain, blood in stool and constipation.       Objective:   Physical Exam  Constitutional: Vital signs are normal. She appears well-developed and well-nourished. She is cooperative.  Non-toxic appearance. She does not appear ill. No distress.  central obesity  HENT:  Head: Normocephalic.  Right Ear: Hearing, tympanic membrane, external ear and ear canal normal.  Left Ear: Hearing, tympanic membrane, external ear and ear canal normal.  Nose: Nose normal.  Eyes: Conjunctivae, EOM and lids are normal. Pupils are equal, round, and reactive to light. Lids are everted and swept, no foreign bodies found.  Neck: Trachea normal and normal range of motion. Neck supple. Carotid bruit is not present. No thyroid mass and no thyromegaly present.  Cardiovascular: Normal rate, regular rhythm, S1 normal, S2 normal, normal heart sounds and intact distal pulses.  Exam reveals no gallop.   No murmur heard. Pulmonary/Chest: Effort normal and breath sounds normal. No respiratory distress. She has no wheezes. She has no rhonchi. She has no rales.  Abdominal: Soft. Normal appearance and bowel sounds are normal. She exhibits no distension, no fluid wave, no abdominal bruit and no mass. There is  no hepatosplenomegaly. There is no tenderness. There is no rebound, no guarding and no CVA tenderness. No hernia.  Musculoskeletal:       Right elbow: She exhibits decreased range of motion, swelling and deformity.       Right wrist: She exhibits decreased range of motion, bony tenderness and swelling.  Lymphadenopathy:    She has no cervical adenopathy.    She has no axillary adenopathy.    Neurological: She is alert. She has normal strength. No cranial nerve deficit or sensory deficit.  Skin: Skin is warm, dry and intact. No rash noted.  Psychiatric: Her speech is normal and behavior is normal. Judgment normal. Her mood appears not anxious. Cognition and memory are normal. She does not exhibit a depressed mood.          Assessment & Plan:  The patient's preventative maintenance and recommended screening tests for an annual wellness exam were reviewed in full today. Brought up to date unless services declined.  Counselled on the importance of diet, exercise, and its role in overall health and mortality. The patient's FH and SH was reviewed, including their home life, tobacco status, and drug and alcohol status.   Vaccines: refused flu as given reaction, uptodate otherwise Pap/DVE:  Not indicated Mammo: not indicated Bone Density:2002 normal,  wuill repeat. Colon: Plan to start Cologuard. Smoking Status:none  She is not interested in hearing screening.

## 2016-10-15 NOTE — Assessment & Plan Note (Signed)
Well controlled. Continue current medication.  

## 2016-10-15 NOTE — Assessment & Plan Note (Signed)
Current flare. Offered pain med pt refused. Recommended discuassion with Rheum about med change or adjustment.  ALT likely elevated in 09/2016 from Gi illness or arava.

## 2016-10-19 ENCOUNTER — Ambulatory Visit (INDEPENDENT_AMBULATORY_CARE_PROVIDER_SITE_OTHER)
Admission: RE | Admit: 2016-10-19 | Discharge: 2016-10-19 | Disposition: A | Discharge status: Home / Self Care | Payer: Medicare Other | Source: Ambulatory Visit | Attending: Family Medicine | Admitting: Family Medicine

## 2016-10-19 DIAGNOSIS — E2839 Other primary ovarian failure: Secondary | ICD-10-CM | POA: Diagnosis not present

## 2016-11-02 ENCOUNTER — Other Ambulatory Visit: Payer: Self-pay | Admitting: Family Medicine

## 2016-11-02 LAB — COLOGUARD: Cologuard: NEGATIVE

## 2016-11-10 ENCOUNTER — Encounter: Payer: Self-pay | Admitting: Family Medicine

## 2017-02-04 ENCOUNTER — Ambulatory Visit (INDEPENDENT_AMBULATORY_CARE_PROVIDER_SITE_OTHER): Payer: Medicare Other | Admitting: Family Medicine

## 2017-02-04 ENCOUNTER — Encounter: Payer: Self-pay | Admitting: Family Medicine

## 2017-02-04 VITALS — BP 132/66 | HR 77 | Temp 98.5°F | Wt 197.0 lb

## 2017-02-04 DIAGNOSIS — R1900 Intra-abdominal and pelvic swelling, mass and lump, unspecified site: Secondary | ICD-10-CM

## 2017-02-04 NOTE — Progress Notes (Signed)
   Subjective:    Patient ID: [name], female    DOB: [birth year], 76 y.o.   MRN: [id number]  HPI   76 year old  Obese female patient presents with new onset lump in abdomen x 2 weeks. She reports area of prominence in left upper quadrant. No change in size, not sure exactly when was there but noted 2 weeks ago. No pain, no redness.  No N/V, occ diarrhea from med, no constipation.  No blood in stool.   Review of Systems  Constitutional: Negative for fatigue and fever.  HENT: Negative for ear pain.   Eyes: Negative for pain.  Respiratory: Negative for chest tightness and shortness of breath.   Cardiovascular: Negative for chest pain, palpitations and leg swelling.  Gastrointestinal: Negative for abdominal pain.  Genitourinary: Negative for dysuria.       Objective:   Physical Exam  Constitutional: Vital signs are normal. She appears well-developed and well-nourished. She is cooperative.  Non-toxic appearance. She does not appear ill. No distress.  HENT:  Head: Normocephalic.  Right Ear: Hearing, tympanic membrane, external ear and ear canal normal. Tympanic membrane is not erythematous, not retracted and not bulging.  Left Ear: Hearing, tympanic membrane, external ear and ear canal normal. Tympanic membrane is not erythematous, not retracted and not bulging.  Nose: No mucosal edema or rhinorrhea. Right sinus exhibits no maxillary sinus tenderness and no frontal sinus tenderness. Left sinus exhibits no maxillary sinus tenderness and no frontal sinus tenderness.  Mouth/Throat: Uvula is midline, oropharynx is clear and moist and mucous membranes are normal.  Eyes: Conjunctivae, EOM and lids are normal. Pupils are equal, round, and reactive to light. Lids are everted and swept, no foreign bodies found.  Neck: Trachea normal and normal range of motion. Neck supple. Carotid bruit is not present. No thyroid mass and no thyromegaly present.  Cardiovascular: Normal rate, regular  rhythm, S1 normal, S2 normal, normal heart sounds, intact distal pulses and normal pulses.  Exam reveals no gallop and no friction rub.   No murmur heard. Pulmonary/Chest: Effort normal and breath sounds normal. No tachypnea. No respiratory distress. She has no decreased breath sounds. She has no wheezes. She has no rhonchi. She has no rales.  Abdominal: Soft. Normal appearance and bowel sounds are normal. There is no tenderness.  Slight prominence of left upper abdominal wall, no underlying mass.  no pain, no skin change.  Neurological: She is alert.  Skin: Skin is warm, dry and intact. No rash noted.  Psychiatric: Her speech is normal and behavior is normal. Judgment and thought content normal. Her mood appears not anxious. Cognition and memory are normal. She does not exhibit a depressed mood.          Assessment & Plan:

## 2017-02-04 NOTE — Patient Instructions (Signed)
Call if area becomes tender, red or painful. No other treatment or eval needed.

## 2017-02-04 NOTE — Assessment & Plan Note (Addendum)
Likely fat or weakening of abd wall left greater than right. Pt with significant central obesity.  No red flags, no mass palpated, asymptomatic.  pt reassured, no indication for further eval.

## 2017-02-04 NOTE — Progress Notes (Signed)
Pre visit review using our clinic review tool, if applicable. No additional management support is needed unless otherwise documented below in the visit note. 

## 2017-03-03 ENCOUNTER — Telehealth: Payer: Self-pay

## 2017-03-03 NOTE — Telephone Encounter (Signed)
Pt having vaginal itching and request med sent to [facility]. Pt has not been taking abx. Pt is going out of town today and will get OTC med to see if that will help; if not pt will cb or if still out of town will go to [facility]. FYI to Dr Diona Browner.

## 2017-04-06 ENCOUNTER — Other Ambulatory Visit: Payer: Self-pay | Admitting: Family Medicine

## 2017-05-11 ENCOUNTER — Ambulatory Visit (INDEPENDENT_AMBULATORY_CARE_PROVIDER_SITE_OTHER): Payer: Medicare Other | Admitting: Family Medicine

## 2017-05-11 ENCOUNTER — Encounter: Payer: Self-pay | Admitting: Family Medicine

## 2017-05-11 DIAGNOSIS — K529 Noninfective gastroenteritis and colitis, unspecified: Secondary | ICD-10-CM | POA: Diagnosis not present

## 2017-05-11 DIAGNOSIS — L2489 Irritant contact dermatitis due to other agents: Secondary | ICD-10-CM

## 2017-05-11 DIAGNOSIS — L259 Unspecified contact dermatitis, unspecified cause: Secondary | ICD-10-CM | POA: Insufficient documentation

## 2017-05-11 MED ORDER — BETAMETHASONE VALERATE 0.1 % EX OINT: 1.0000 "application " | TOPICAL_OINTMENT | Freq: Two times a day (BID) | CUTANEOUS | 0 refills | Status: DC

## 2017-05-11 NOTE — Progress Notes (Signed)
   Subjective:    Patient ID: [name], female    DOB: [birth year], 76 y.o.   MRN: [id number]  HPI   76 year old female presents with new onset vaginal itching.  She has been having 3-4 months of vaginal itching. MAinly in vaginal area but also in groin creases.  Odor. No discharge. No burning with urination.  She is using a pad for stool leakage.  She has had no relief with rectal anti-itch cream.  No blood in stool or urine or vaginally. No recent antibiotics.  She has had diarrhea chronically for 1 year for leflunomide for RA. She stopped this med for July 9th. Minimal improvement so far.   Had neg GI panel.     Review of Systems  Constitutional: Negative for fatigue and fever.  HENT: Negative for ear pain.   Eyes: Negative for pain.  Respiratory: Negative for chest tightness and shortness of breath.   Cardiovascular: Negative for chest pain, palpitations and leg swelling.  Gastrointestinal: Negative for abdominal pain.  Genitourinary: Negative for dysuria.       Objective:   Physical Exam  Constitutional: Vital signs are normal. She appears well-developed and well-nourished. She is cooperative.  Non-toxic appearance. She does not appear ill. No distress.  Elderly female in NAD  HENT:  Head: Normocephalic.  Right Ear: Hearing, tympanic membrane, external ear and ear canal normal. Tympanic membrane is not erythematous, not retracted and not bulging.  Left Ear: Hearing, tympanic membrane, external ear and ear canal normal. Tympanic membrane is not erythematous, not retracted and not bulging.  Nose: No mucosal edema or rhinorrhea. Right sinus exhibits no maxillary sinus tenderness and no frontal sinus tenderness. Left sinus exhibits no maxillary sinus tenderness and no frontal sinus tenderness.  Mouth/Throat: Uvula is midline, oropharynx is clear and moist and mucous membranes are normal.  Eyes: Pupils are equal, round, and reactive to light. Conjunctivae, EOM and  lids are normal. Lids are everted and swept, no foreign bodies found.  Neck: Trachea normal and normal range of motion. Neck supple. Carotid bruit is not present. No thyroid mass and no thyromegaly present.  Cardiovascular: Normal rate, regular rhythm, S1 normal, S2 normal, normal heart sounds, intact distal pulses and normal pulses.  Exam reveals no gallop and no friction rub.   No murmur heard. Pulmonary/Chest: Effort normal and breath sounds normal. No tachypnea. No respiratory distress. She has no decreased breath sounds. She has no wheezes. She has no rhonchi. She has no rales.  Abdominal: Soft. Normal appearance and bowel sounds are normal. There is no tenderness.  Genitourinary: Rectum normal. Rectal exam shows no external hemorrhoid, no internal hemorrhoid, no fissure, no mass, no tenderness, anal tone normal and guaiac negative stool.    There is rash on the left labia. There is no tenderness on the left labia. No erythema in the vagina. No vaginal discharge found.  Genitourinary Comments: Area of irritation external.. Not in gpoin.  no vaginal discharge   Moist indurated tissue, no satellite lesions.  Neurological: She is alert.  Skin: Skin is warm, dry and intact. No rash noted.  Psychiatric: Her speech is normal and behavior is normal. Judgment and thought content normal. Her mood appears not anxious. Cognition and memory are normal. She does not exhibit a depressed mood.          Assessment & Plan:

## 2017-05-11 NOTE — Patient Instructions (Addendum)
Try probiotic for diarrhea.  Keep up with fluids.  Start fiber like metamucil.  Call if not improving and if rheumatologist feels strongly it is not from the leflunomide... for  likelyGI referral.  Apply topical steroid to area externally twice daily where itching.  Then cover with barrier cream liberally like Desitin or Balmex.

## 2017-05-11 NOTE — Assessment & Plan Note (Signed)
Pt feels it is due to the leflunomide although she has been off it for  several months.  She feels symptoms started when this med was started. Per pt PI says med stays in system for  Up to 2 years.   Other wise hemeoccult today was negative.  GI panel in 09/2016 was neg for cdiff etc.  ? Due to IBS.  Will have her try probiotic and fiber. Keep up with fluids.  If not improving consider GI work up or trial of cholestyramine.

## 2017-05-11 NOTE — Assessment & Plan Note (Signed)
Likely due to irritation form frequent stool and pad.  treat with topical steroid and barrier cream.  No sign of yeast on exam.

## 2017-05-13 ENCOUNTER — Ambulatory Visit: Payer: Medicare Other | Admitting: Family Medicine

## 2017-08-05 ENCOUNTER — Ambulatory Visit (INDEPENDENT_AMBULATORY_CARE_PROVIDER_SITE_OTHER): Payer: Medicare Other

## 2017-08-05 DIAGNOSIS — Z23 Encounter for immunization: Secondary | ICD-10-CM | POA: Diagnosis not present

## 2017-10-05 HISTORY — PX: LESION DESTRUCTION: SHX5132

## 2017-10-25 ENCOUNTER — Other Ambulatory Visit: Payer: Self-pay | Admitting: Family Medicine

## 2017-10-25 NOTE — Telephone Encounter (Signed)
Last office visit 05/11/2017.  Not on current medication list.  Refill?

## 2017-10-25 NOTE — Telephone Encounter (Signed)
Please find out what she is using this for.. Make sure not continuous use on same place over > 2 weeks as it can thin the skin and cause increase in vascularity at the site

## 2017-10-26 NOTE — Telephone Encounter (Signed)
Spoke with Mrs. Conley Canal.  She states using is for the same contact dermatitis.  It has come back.  Is there something else she can use?

## 2017-10-26 NOTE — Telephone Encounter (Signed)
Sent in  Refill.Faythe Ghee to use for fare upas as long as not continuous use.

## 2017-10-27 NOTE — Telephone Encounter (Signed)
[name] notified as instructed by telephone.   

## 2017-11-02 ENCOUNTER — Telehealth: Payer: Self-pay | Admitting: Family Medicine

## 2017-11-02 DIAGNOSIS — E782 Mixed hyperlipidemia: Secondary | ICD-10-CM

## 2017-11-02 DIAGNOSIS — R7303 Prediabetes: Secondary | ICD-10-CM

## 2017-11-02 NOTE — Telephone Encounter (Signed)
-----   Message from Eustace Pen, LPN sent at 7/51/0258 12:46 PM EST ----- Regarding: Lab 1/30 Lab orders needed. Thank you.  Insurance:  [facility]

## 2017-11-04 ENCOUNTER — Ambulatory Visit (INDEPENDENT_AMBULATORY_CARE_PROVIDER_SITE_OTHER): Payer: Medicare Other

## 2017-11-04 VITALS — BP 110/78 | HR 64 | Temp 97.9°F | Ht 63.5 in | Wt 207.8 lb

## 2017-11-04 DIAGNOSIS — R7303 Prediabetes: Secondary | ICD-10-CM

## 2017-11-04 DIAGNOSIS — Z0000 Encounter for general adult medical examination without abnormal findings: Secondary | ICD-10-CM

## 2017-11-04 DIAGNOSIS — E782 Mixed hyperlipidemia: Secondary | ICD-10-CM | POA: Diagnosis not present

## 2017-11-04 LAB — LIPID PANEL
CHOL/HDL RATIO: 4
Cholesterol: 206 mg/dL — ABNORMAL HIGH (ref 0–200)
HDL: 54.1 mg/dL (ref >39.00)
LDL Cholesterol: 137 mg/dL — ABNORMAL HIGH (ref 0–99)
NONHDL: 151.72
Triglycerides: 73 mg/dL (ref 0.0–149.0)
VLDL: 14.6 mg/dL (ref 0.0–40.0)

## 2017-11-04 LAB — COMPREHENSIVE METABOLIC PANEL
ALK PHOS: 84 U/L (ref 39–117)
ALT: 18 U/L (ref 0–35)
AST: 20 U/L (ref 0–37)
Albumin: 4 g/dL (ref 3.5–5.2)
BUN: 16 mg/dL (ref 6–23)
CO2: 33 meq/L — AB (ref 19–32)
CREATININE: 0.66 mg/dL (ref 0.40–1.20)
Calcium: 9.4 mg/dL (ref 8.4–10.5)
Chloride: 102 mEq/L (ref 96–112)
GFR: 91.92 mL/min (ref >60.00)
GLUCOSE: 106 mg/dL — AB (ref 70–99)
Potassium: 4.2 mEq/L (ref 3.5–5.1)
SODIUM: 139 meq/L (ref 135–145)
TOTAL PROTEIN: 7.7 g/dL (ref 6.0–8.3)
Total Bilirubin: 0.4 mg/dL (ref 0.2–1.2)

## 2017-11-04 LAB — HEMOGLOBIN A1C: HEMOGLOBIN A1C: 6.1 % (ref 4.6–6.5)

## 2017-11-04 NOTE — Progress Notes (Signed)
PCP notes:   Health maintenance:  No gaps identified.  Abnormal screenings:   Fall risk- hx of multiple falls Fall Risk  11/04/2017 10/06/2016 09/11/2015  Falls in the past year? Yes No No  Comment 4 falls with minor injury and no medical treatment - -  Number falls in past yr: 2 or more - -    Hearing - failed  Hearing Screening   125Hz  250Hz  500Hz  1000Hz  2000Hz  3000Hz  4000Hz  6000Hz  8000Hz   Right ear:   40 40 40  40    Left ear:   40 40 40  0     Patient concerns:   Patient verbalized concerns with increased dizziness and chronic fatigue.   Nurse concerns:  None  Next PCP appt:   11/11/17 @ 1115

## 2017-11-04 NOTE — Patient Instructions (Signed)
[name] , Thank you for taking time to come for your Medicare Wellness Visit. I appreciate your ongoing commitment to your health goals. Please review the following plan we discussed and let me know if I can assist you in the future.   These are the goals we discussed: Goals    . Follow up with Primary Care Provider     Starting 11/04/2017, I will continue to take medications as prescribed and to keep appointments with PCP as scheduled.        This is a list of the screening recommended for you and due dates:  Health Maintenance  Topic Date Due  . Tetanus Vaccine  01/03/2018  . Flu Shot  Completed  . DEXA scan (bone density measurement)  Completed  . Pneumonia vaccines  Completed   Preventive Care for Adults  A healthy lifestyle and preventive care can promote health and wellness. Preventive health guidelines for adults include the following key practices.  . A routine yearly physical is a good way to check with your health care provider about your health and preventive screening. It is a chance to share any concerns and updates on your health and to receive a thorough exam.  . Visit your dentist for a routine exam and preventive care every 6 months. Brush your teeth twice a day and floss once a day. Good oral hygiene prevents tooth decay and gum disease.  . The frequency of eye exams is based on your age, health, family medical history, use  of contact lenses, and other factors. Follow your health care provider's recommendations for frequency of eye exams.  . Eat a healthy diet. Foods like vegetables, fruits, whole grains, low-fat dairy products, and lean protein foods contain the nutrients you need without too many calories. Decrease your intake of foods high in solid fats, added sugars, and salt. Eat the right amount of calories for you. Get information about a proper diet from your health care provider, if necessary.  . Regular physical exercise is one of the most important  things you can do for your health. Most adults should get at least 150 minutes of moderate-intensity exercise (any activity that increases your heart rate and causes you to sweat) each week. In addition, most adults need muscle-strengthening exercises on 2 or more days a week.  Silver Sneakers may be a benefit available to you. To determine eligibility, you may visit the website: www.silversneakers.com or contact program at 219-840-3824 Mon-Fri between 8AM-8PM.   . Maintain a healthy weight. The body mass index (BMI) is a screening tool to identify possible weight problems. It provides an estimate of body fat based on height and weight. Your health care provider can find your BMI and can help you achieve or maintain a healthy weight.   For adults 20 years and older: ? A BMI below 18.5 is considered underweight. ? A BMI of 18.5 to 24.9 is normal. ? A BMI of 25 to 29.9 is considered overweight. ? A BMI of 30 and above is considered obese.   . Maintain normal blood lipids and cholesterol levels by exercising and minimizing your intake of saturated fat. Eat a balanced diet with plenty of fruit and vegetables. Blood tests for lipids and cholesterol should begin at age 67 and be repeated every 5 years. If your lipid or cholesterol levels are high, you are over 50, or you are at high risk for heart disease, you may need your cholesterol levels checked more frequently. Ongoing high lipid  and cholesterol levels should be treated with medicines if diet and exercise are not working.  . If you smoke, find out from your health care provider how to quit. If you do not use tobacco, please do not start.  . If you choose to drink alcohol, please do not consume more than 2 drinks per day. One drink is considered to be 12 ounces (355 mL) of beer, 5 ounces (148 mL) of wine, or 1.5 ounces (44 mL) of liquor.  . If you are 54-4 years old, ask your health care provider if you should take aspirin to prevent  strokes.  . Use sunscreen. Apply sunscreen liberally and repeatedly throughout the day. You should seek shade when your shadow is shorter than you. Protect yourself by wearing long sleeves, pants, a wide-brimmed hat, and sunglasses year round, whenever you are outdoors.  . Once a month, do a whole body skin exam, using a mirror to look at the skin on your back. Tell your health care provider of new moles, moles that have irregular borders, moles that are larger than a pencil eraser, or moles that have changed in shape or color.

## 2017-11-04 NOTE — Progress Notes (Signed)
I reviewed health advisor's note, was available for consultation, and agree with documentation and plan.   Signed,  Faisal Stradling T. Amrom Ore, MD  

## 2017-11-04 NOTE — Progress Notes (Signed)
Pre visit review using our clinic review tool, if applicable. No additional management support is needed unless otherwise documented below in the visit note. 

## 2017-11-04 NOTE — Progress Notes (Signed)
Subjective:   [name] is a 77 y.o. female who presents for Medicare Annual (Subsequent) preventive examination.  Review of Systems:  N/A Cardiac Risk Factors include: advanced age (>26men, >42 women);obesity (BMI >30kg/m2);dyslipidemia;hypertension     Objective:     Vitals: BP 110/78 (BP Location: Left Arm, Patient Position: Sitting, Cuff Size: Normal)   Pulse 64   Temp 97.9 F (36.6 C) (Oral)   Ht 5' 3.5" (1.613 m) Comment: no shoes  Wt 207 lb 12 oz (94.2 kg)   SpO2 94%   BMI 36.22 kg/m   Body mass index is 36.22 kg/m.  Advanced Directives 11/04/2017 10/06/2016 10/11/2012 10/03/2012  Does Patient Have a Medical Advance Directive? Yes Yes Patient does not have advance directive Patient does not have advance directive  Type of Advance Directive [place];Living will [place];Living will - -  Copy of [place] in Chart? No - copy requested No - copy requested - -  Pre-existing out of facility DNR order (yellow form or pink MOST form) - - No -    Tobacco Social History   Tobacco Use  Smoking Status Former Smoker  . Packs/day: 1.00  . Years: 5.00  . Pack years: 5.00  Smokeless Tobacco Never Used     Counseling given: No   Clinical Intake:  Pre-visit preparation completed: Yes  Pain : 0-10 Pain Score: 4  Pain Type: Chronic pain Pain Location: Wrist(mid back, bilateral feet) Pain Orientation: Left, Right, Mid Pain Onset: More than a month ago Pain Frequency: Constant     Nutritional Status: BMI > 30  Obese Nutritional Risks: None Diabetes: No  How often do you need to have someone help you when you read instructions, pamphlets, or other written materials from your doctor or pharmacy?: 1 - Never What is the last grade level you completed in school?: 12th grade  Interpreter Needed?: No  Comments: pt lives with spouse Information entered by :: LPinson, LPN  Past Medical History:    Diagnosis Date  . Cataract    diagnosed by Dr. Phineas Douglas  . Dysrhythmia    PSVT/ palpitations-  controlled with prn Inderal  . GERD (gastroesophageal reflux disease)   . Hyperlipidemia    statin intolerant  . Hypertension    eccho 4/13 EPIC  . Migraines   . Obesity   . Osteoarthritis   . Phlebitis of leg, right, superficial 1986/1989   x 2   Past Surgical History:  Procedure Laterality Date  . CARDIAC CATHETERIZATION  1990s   negative  . CARDIOVASCULAR STRESS TEST  2008   NML  . CARDIOVASCULAR STRESS TEST  02/02/2007   EF 70%, NO ISCHEMIA  . DILATION AND CURETTAGE, DIAGNOSTIC / THERAPEUTIC  2008  . LESION DESTRUCTION  10/2017   Face; Dr. Ronnald Ramp  . ROTATOR CUFF REPAIR  1999   Right, left in 2002  . 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 US ECHOCARDIOGRAPHY  02/07/2007   EF 55-60%   Family History  Problem Relation Age of Onset  . Heart attack Father 31       PUD  . Hypertension Father   . Lung cancer Brother   . Heart defect Sister   . Hypertension Mother   . Coronary artery disease Unknown  Female 1st degree relative <50   Social History   Socioeconomic History  . Marital status: Married    Spouse name: None  . Number of children: None  . Years of education: None  . Highest education level: None  Social Needs  . Financial resource strain: None  . Food insecurity - worry: None  . Food insecurity - inability: None  . Transportation needs - medical: None  . Transportation needs - non-medical: None  Occupational History  . Occupation: Retired  Tobacco Use  . Smoking status: Former Smoker    Packs/day: 1.00    Years: 5.00    Pack years: 5.00  . Smokeless tobacco: Never Used  Substance and Sexual Activity  . Alcohol use: No    Comment: stopped 5 years ago  . Drug use: No  . Sexual  activity: Yes  Other Topics Concern  . None  Social History Narrative   Regular exercise: yes walks 1 mile a day   Diet: loves butter, fruit and veggies    Outpatient Encounter Medications as of 11/04/2017  Medication Sig  . betamethasone valerate ointment (VALISONE) 0.1 % APPLY OINTMENT TOPICALLY TWO TIMES DAILY  . etanercept (ENBREL) 25 MG injection Inject 25 mg into the skin once a week.  Marland Kitchen FOLIC ACID PO Take 1 tablet by mouth daily.  . hydrochlorothiazide (HYDRODIURIL) 25 MG tablet TAKE 1 TABLET BY MOUTH ONCE A DAY   No facility-administered encounter medications on file as of 11/04/2017.     Activities of Daily Living In your present state of health, do you have any difficulty performing the following activities: 11/04/2017  Hearing? N  Vision? N  Difficulty concentrating or making decisions? N  Walking or climbing stairs? N  Dressing or bathing? N  Doing errands, shopping? N  Preparing Food and eating ? N  Using the Toilet? N  In the past six months, have you accidently leaked urine? Y  Comment pad worn daily  Do you have problems with loss of bowel control? N  Managing your Medications? N  Managing your Finances? N  Housekeeping or managing your Housekeeping? N  Some recent data might be hidden    Patient Care Team: [name], MD as PCP - General Rosita Kea, PA-C as Physician Assistant (Rheumatology) Christain [place], [place] as Referring Physician (Optometry) Danella Sensing, MD as Consulting Physician (Dermatology)    Assessment:   This is a routine wellness examination for [name].   Hearing Screening   125Hz  250Hz  500Hz  1000Hz  2000Hz  3000Hz  4000Hz  6000Hz  8000Hz   Right ear:   40 40 40  40    Left ear:   40 40 40  0    Vision Screening Comments: Vision exam in Jan 2019 with Dr. Phineas Douglas  Exercise Activities and Dietary recommendations Current Exercise Habits: Home exercise routine, Type of exercise: walking, Time (Minutes): 45, Frequency (Times/Week): 7, Weekly  Exercise (Minutes/Week): 315, Intensity: Mild, Exercise limited by: None identified  Goals    . Follow up with Primary Care Provider     Starting 11/04/2017, I will continue to take medications as prescribed and to keep appointments with PCP as scheduled.        Fall Risk Fall Risk  11/04/2017 10/06/2016 09/11/2015  Falls in the past year? Yes No No  Comment 4 falls with minor injury and no medical treatment - -  Number falls in past yr: 2 or more - -   Depression Screen PHQ 2/9 Scores 11/04/2017 10/06/2016 09/11/2015  PHQ -  2 Score 0 0 0  PHQ- 9 Score 0 - -     Cognitive Function MMSE - Mini Mental State Exam 11/04/2017 10/06/2016  Orientation to time 5 5  Orientation to Place 5 5  Registration 3 3  Attention/ Calculation 0 0  Recall 3 3  Language- name 2 objects 0 0  Language- repeat 1 1  Language- follow 3 step command 3 3  Language- read & follow direction 0 0  Write a sentence 0 0  Copy design 0 0  Total score 20 20     PLEASE NOTE: A Mini-Cog screen was completed. Maximum score is 20. A value of 0 denotes this part of [place] MMSE was not completed or the patient failed this part of the Mini-Cog screening.   Mini-Cog Screening Orientation to Time - Max 5 pts Orientation to Place - Max 5 pts Registration - Max 3 pts Recall - Max 3 pts Language Repeat - Max 1 pts Language Follow 3 Step Command - Max 3 pts     Immunization History  Administered Date(s) Administered  . Influenza,inj,Quad PF,6+ Mos 08/05/2017  . Pneumococcal Conjugate-13 07/10/2014  . Pneumococcal Polysaccharide-23 01/04/2008  . Td 10/05/1996, 01/04/2008  . Zoster 02/01/2008   Screening Tests Health Maintenance  Topic Date Due  . TETANUS/TDAP  01/03/2018  . INFLUENZA VACCINE  Completed  . DEXA SCAN  Completed  . PNA vac Low Risk Adult  Completed      Plan:     I have personally reviewed, addressed, and noted the following in the patient's chart:  A. Medical and social history B. Use of  alcohol, tobacco or illicit drugs  C. Current medications and supplements D. Functional ability and status E.  Nutritional status F.  Physical activity G. Advance directives H. List of other physicians I.  Hospitalizations, surgeries, and ER visits in previous 12 months J.  [place] to include hearing, vision, cognitive, depression L. Referrals and appointments - none  In addition, I have reviewed and discussed with patient certain preventive protocols, quality metrics, and best practice recommendations. A written personalized care plan for preventive services as well as general preventive health recommendations were provided to patient.  See attached scanned questionnaire for additional information.   Signed,   Lindell Noe, MHA, BS, LPN Health Coach

## 2017-11-11 ENCOUNTER — Ambulatory Visit (INDEPENDENT_AMBULATORY_CARE_PROVIDER_SITE_OTHER): Payer: Medicare Other | Admitting: Family Medicine

## 2017-11-11 ENCOUNTER — Encounter: Payer: Self-pay | Admitting: Family Medicine

## 2017-11-11 VITALS — BP 120/60 | HR 75 | Temp 99.0°F | Ht 63.5 in | Wt 209.2 lb

## 2017-11-11 DIAGNOSIS — H8113 Benign paroxysmal vertigo, bilateral: Secondary | ICD-10-CM

## 2017-11-11 DIAGNOSIS — I10 Essential (primary) hypertension: Secondary | ICD-10-CM

## 2017-11-11 DIAGNOSIS — R7303 Prediabetes: Secondary | ICD-10-CM | POA: Diagnosis not present

## 2017-11-11 DIAGNOSIS — Z0001 Encounter for general adult medical examination with abnormal findings: Secondary | ICD-10-CM | POA: Diagnosis not present

## 2017-11-11 DIAGNOSIS — N3941 Urge incontinence: Secondary | ICD-10-CM | POA: Diagnosis not present

## 2017-11-11 DIAGNOSIS — M05741 Rheumatoid arthritis with rheumatoid factor of right hand without organ or systems involvement: Secondary | ICD-10-CM | POA: Diagnosis not present

## 2017-11-11 DIAGNOSIS — Z6836 Body mass index (BMI) 36.0-36.9, adult: Secondary | ICD-10-CM

## 2017-11-11 DIAGNOSIS — E782 Mixed hyperlipidemia: Secondary | ICD-10-CM

## 2017-11-11 DIAGNOSIS — M05742 Rheumatoid arthritis with rheumatoid factor of left hand without organ or systems involvement: Secondary | ICD-10-CM

## 2017-11-11 DIAGNOSIS — Z0000 Encounter for general adult medical examination without abnormal findings: Secondary | ICD-10-CM

## 2017-11-11 NOTE — Assessment & Plan Note (Signed)
Poor control.. Refuses medication at this time. Reviewed diet changes again with pt indetail.

## 2017-11-11 NOTE — Assessment & Plan Note (Signed)
Some improvement on enbrel.. But may not be able to afford.

## 2017-11-11 NOTE — Patient Instructions (Addendum)
Consider water exercise. Increase activity.  Work on low carb low animal fat diet.  Continue vertigo exercises.  Can use flonase OTC 2 spray per nostril daily x 2 weeks for fluid behind ear drums.  Call for balcane retraining PT if vertigo not improving.  Call insurance to see if Edyth Gunnels is covered for urge incontinence.

## 2017-11-11 NOTE — Progress Notes (Addendum)
Subjective:    Patient ID: [name], female    DOB: 12/04/1940, 77 y.o.   MRN: [id number]  HPI  The patient presents for  complete physical and review of chronic health problems. He/She also has the following acute concerns today: vertigo, fatigue, urge incontinence  The patient saw Candis Musa, LPN for medicare wellness. Note reviewed in detail and important notes copied below.  Health maintenance: No gaps identified. Abnormal screenings:  Fall risk- hx of multiple falls Fall Risk  11/04/2017 10/06/2016 09/11/2015  Falls in the past year? Yes No No  Comment 4 falls with minor injury and no medical treatment - -  Number falls in past yr: 2 or more - -   Hearing - failed             Hearing Screening   125Hz  250Hz  500Hz  1000Hz  2000Hz  3000Hz  4000Hz  6000Hz  8000Hz   Right ear:   40 40 40  40    Left ear:   40 40 40  0    Patient concerns:  Patient verbalized concerns with increased dizziness and chronic fatigue.    11/11/17 Today  RA: She has been on Enbrel in last 2 months but cost is high... May be helping with joints.  No SE.  referred to [facility] rhe[facility].  Causing chronic fatigue.   She has off and on vertigo in last 2 -3 months. Notes when leans back head or moves to quickly.  Hypertension:    BP Readings from Last 3 Encounters:  11/11/17 120/60  11/04/17 110/78  05/11/17 100/60  Using medication without problems or lightheadedness:  none Chest pain with exertion: none Edema:none Short of breath: none Average home BPs: Other issues:  prediabetes  Lab Results  Component Value Date   HGBA1C 6.1 11/04/2017   Elevated Cholesterol:  Lab Results  Component Value Date   CHOL 206 (H) 11/04/2017   HDL 54.10 11/04/2017   LDLCALC 137 (H) 11/04/2017   LDLDIRECT 151.5 08/17/2012   TRIG 73.0 11/04/2017   CHOLHDL 4 11/04/2017   On no med. 12% 10 year risk for CVD per AHA risk calculator. Intolerant of statin as a class ... Even red yeast  rice. Muscle aches:  yes Diet compliance: moderate Exercise: minimal, walking some Other complaints:   Social History /Family History/Past Medical History reviewed in detail and updated in EMR if needed. Blood pressure 120/60, pulse 75, temperature 99 F (37.2 C), temperature source Oral, height 5' 3.5" (1.613 m), weight 209 lb 4 oz (94.9 kg).   Review of Systems  Genitourinary: Positive for frequency and urgency. Negative for dysuria, flank pain and pelvic pain.       No nocturia.. Wears pads... 8-10 UOP a day   Body mass index is 36.49 kg/m.     Objective:   Physical Exam  Constitutional: Vital signs are normal. She appears well-developed and well-nourished. She is cooperative.  Non-toxic appearance. She does not appear ill. No distress. Face mask in place.  Overweight female in NAD  HENT:  Head: Normocephalic.  Right Ear: Hearing, tympanic membrane, external ear and ear canal normal.  Left Ear: Hearing, tympanic membrane, external ear and ear canal normal.  Nose: Nose normal.  Eyes: Conjunctivae, EOM and lids are normal. Pupils are equal, round, and reactive to light. Lids are everted and swept, no foreign bodies found.  Neck: Trachea normal and normal range of motion. Neck supple. Carotid bruit is not present. No thyroid mass and no thyromegaly present.  Cardiovascular: Normal  rate, regular rhythm, S1 normal, S2 normal, normal heart sounds and intact distal pulses. Exam reveals no gallop.  No murmur heard. Pulmonary/Chest: Effort normal and breath sounds normal. No respiratory distress. She has no wheezes. She has no rhonchi. She has no rales.  Abdominal: Soft. Normal appearance and bowel sounds are normal. She exhibits no distension, no fluid wave, no abdominal bruit and no mass. There is no hepatosplenomegaly. There is no tenderness. There is no rebound, no guarding and no CVA tenderness. No hernia.  Lymphadenopathy:    She has no cervical adenopathy.    She has no axillary  adenopathy.  Neurological: She is alert. She has normal strength. No cranial nerve deficit or sensory deficit.  Skin: Skin is warm, dry and intact. No rash noted.  Psychiatric: Her speech is normal and behavior is normal. Judgment normal. Her mood appears not anxious. Cognition and memory are normal. She does not exhibit a depressed mood.          Assessment & Plan:  The patient's preventative maintenance and recommended screening tests for an annual wellness exam were reviewed in full today. Brought up to date unless services declined.  Counselled on the importance of diet, exercise, and its role in overall health and mortality. The patient's FH and SH was reviewed, including their home life, tobacco status, and drug and alcohol status.   Vaccines: Tdap due, uptodate otherwise Pap/DVE:  Not indicated Mammo: not indicated Bone Density:2019 normal  Cologuard.: neg 2018 Smoking Status:none

## 2017-11-11 NOTE — Assessment & Plan Note (Signed)
Well controlled. Continue current medication.  

## 2017-11-11 NOTE — Assessment & Plan Note (Signed)
Encouraged exercise, weight loss, healthy eating habits. ? ?

## 2017-11-11 NOTE — Assessment & Plan Note (Signed)
She will call if interested in rail of [email or web address].

## 2017-11-11 NOTE — Assessment & Plan Note (Signed)
Work on low carb diet, increase activity and weight loss as able.

## 2017-11-11 NOTE — Assessment & Plan Note (Signed)
Treat fluid in ears with flonase.. If vertigo not improving.Marland Kitchen Refer for balance retraining.

## 2018-01-24 ENCOUNTER — Other Ambulatory Visit: Payer: Self-pay | Admitting: Family Medicine

## 2018-02-15 ENCOUNTER — Telehealth: Payer: Self-pay | Admitting: Family Medicine

## 2018-02-15 NOTE — Telephone Encounter (Signed)
Under imaging tab the CXR 11/07/15 does note stable mild scarring at lt lung base. ? Is this possibly related to pt question? Please advise.

## 2018-02-15 NOTE — Telephone Encounter (Signed)
[name] notified as instructed by telephone.

## 2018-02-15 NOTE — Telephone Encounter (Signed)
Let pt know in 2017 CXR: There is stable mild scarring at the left lung base and mild central airway Thickening. Felt likely due to past cigarette use. No airspace disease, edema or significant pleural effusion.

## 2018-02-15 NOTE — Telephone Encounter (Signed)
Copied from [place] [id number]. Topic: Quick Communication - See Telephone Encounter >> Feb 15, 2018  3:28 PM Synthia Innocent wrote: CRM for notification. See Telephone encounter for: 02/15/18. 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 Would like to know if that has been seen on any other chest xrays

## 2018-02-22 ENCOUNTER — Ambulatory Visit (INDEPENDENT_AMBULATORY_CARE_PROVIDER_SITE_OTHER): Payer: Medicare Other | Admitting: Internal Medicine

## 2018-02-22 ENCOUNTER — Encounter: Payer: Self-pay | Admitting: Internal Medicine

## 2018-02-22 DIAGNOSIS — R0602 Shortness of breath: Secondary | ICD-10-CM | POA: Diagnosis not present

## 2018-02-22 DIAGNOSIS — M069 Rheumatoid arthritis, unspecified: Secondary | ICD-10-CM | POA: Diagnosis not present

## 2018-02-22 DIAGNOSIS — R0609 Other forms of dyspnea: Secondary | ICD-10-CM

## 2018-02-22 DIAGNOSIS — Z6836 Body mass index (BMI) 36.0-36.9, adult: Secondary | ICD-10-CM | POA: Diagnosis not present

## 2018-02-22 NOTE — Patient Instructions (Signed)
ICD-10-CM   1. Dyspnea on exertion R06.09   2. Rheumatoid arthritis, involving unspecified site, unspecified rheumatoid factor presence ([place]) M06.9   3. Severe obesity with body mass index (BMI) of 36.0 to 36.9 with serious comorbidity (HCC) E66.01    Z68.36    Shortness of breath is not fully explained Your oxygen level held good with walking but your heart rate jumped fast Need to rule out pulmonary fibrosis and pulmonary hypertension IF these are ruled out weight gain, stiff heart muscle (diastolic dysfunction) and physical deconditioning likely playing role  PLAN - do Pre-bd spiro and dlco only. No lung volume or bd response. No post-bd spiro - next few weeks - do HRCT supine and prone next few weeks - do ECHO next few weeks  Followup - return to see Dr Chase Caller or an APP next few weeks  - if tests are normal then we can discuss trial of pulmonary rehab v bike pulmonary stress test

## 2018-02-22 NOTE — Progress Notes (Signed)
Subjective:    Patient ID: [name], female    DOB: 1940/12/16, 77 y.o.   MRN: [id number] PCP [name], MD  HPI  IOV 02/22/2018   [name] presents on behalf of Dr. Leigh Aurora and his team for evaluation of shortness of breath and concern for possible interstitial lung disease seen on chest x-ray.  History is given by the patient and review of the chart including old medical records and visualization of the films mentioned.  According to the patient she was diagnosed with rheumatoid arthritis not otherwise specified approximately 3 years ago.  For the first year she was on methotrexate which caused weight gain and itchiness and this was stopped.  Subsequently was on leflunomide which caused diarrhea and 16 pound weight loss and therefore had to be stopped in 2018.  Early in 2019 she was started on Enbrel which she believes is causing weight gain although when I asked her if her weight gain was just a reflection of her not being on [place] she conceded that it might not be the Enbrel directly causing the weight gain.  Nevertheless she has had weight gain with a significant and documented below.  Associated with his weight gain has been insidious onset of shortness of breath with exertion relieved by rest.  She notices shortness of breath when walking up an incline compared to her peers and she has to stop to rest.  There is no associated chest pain or proximal nocturnal dyspnea or orthopnea wheezing or cough or edema.  She says the most of the time she does not subjectively feel the dyspnea but then she is noticed to be visibly dyspneic by the family or onlookers.  Such was the case when she walked in our office today when she got tachycardic without any desaturation.  American [employer] of chest physicians interstitial lung disease questionnaire  Symptoms: She has occasional cough but not bothersome.  Does not cough at night does not wake her up.  She walks slower than  people of her age.  Started 6 months ago  Past medical history: She says she has a "fat heart" although 2013 echocardiogram that I reviewed was normal.  She denies any weight loss or dysphagia or heartburn or acid reflux or dry eyes or chest pain or ongoing arthralgia  Personal exposure history: She never smoked any recreational drugs.  She started smoking at age 1 smoked half a pack a day and quit when she was 47.  Family history of lung disease: This emphysema in her brother but no family history of pulmonary fibrosis  Home environment history: He does not old house.  There is no humidifier or insomnia or hot tub or Jacuzzi or water damage.  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  Simple office walk 185 feet x  3 laps goal with forehead probe 02/22/2018   O2 used Room air  Number laps completed 3 all laps  Comments about pace Normal pace  Resting Pulse Ox/HR 98% and 85/min  Final Pulse Ox/HR 97% and 121/min  Desaturated </= 88% no  Desaturated <= 3% points no  Got Tachycardic >/=  90/min yes  Symptoms at end of test Visibly Dyspneic and improved with rest. No chest pain or cough but no subjective dyspnea  Miscellaneous comments none     has a past medical history of Cataract, Dysrhythmia, GERD (gastroesophageal reflux disease), Hyperlipidemia, Hypertension, Migraines, Obesity, Osteoarthritis, and Phlebitis of leg, right, superficial (1986/1989).   reports that she has quit smoking. She has a 5.00 pack-year smoking history. She has never used smokeless tobacco.  Past Surgical History:  Procedure Laterality Date    . CARDIAC CATHETERIZATION  1990s   negative  . CARDIOVASCULAR STRESS TEST  2008   NML  . CARDIOVASCULAR STRESS TEST  02/02/2007   EF 70%, NO ISCHEMIA  . DILATION AND CURETTAGE, DIAGNOSTIC / THERAPEUTIC  2008  . LESION DESTRUCTION  10/2017   Face; Dr. Ronnald Ramp  . ROTATOR CUFF REPAIR  1999   Right, left in 2002  . 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 US ECHOCARDIOGRAPHY  02/07/2007   EF 55-60%    Allergies  Allergen Reactions  . Codeine     REACTION: Nausea and vomiting  . Hydrocodone-Homatropine Other (See Comments)    hallucinations    Immunization History  Administered Date(s) Administered  . Influenza,inj,Quad PF,6+ Mos 08/05/2017  . Pneumococcal Conjugate-13 07/10/2014  . Pneumococcal Polysaccharide-23 01/04/2008  . Td 10/05/1996, 01/04/2008  . Zoster 02/01/2008    Family History  Problem Relation Age of Onset  . Heart attack Father 50       PUD  . Hypertension Father   . Lung cancer Brother   . Heart defect Sister   . Hypertension Mother   . Coronary artery disease Unknown        Female 1st degree relative <50     Current Outpatient Medications:  .  etanercept (ENBREL) 50 MG/ML injection, Inject 50 mg into the skin once a week., Disp: , Rfl:  .  FOLIC ACID PO, Take 20 mg by mouth daily. , Disp: , Rfl:  .  hydrochlorothiazide (HYDRODIURIL) 25 MG tablet, TAKE ONE TABLET BY MOUTH ONE TIME DAILY , Disp: 90 tablet, Rfl: 1 .  meloxicam (MOBIC) 15 MG tablet, Take 15 mg by mouth daily. Daily prn, Disp: , Rfl:  .  betamethasone valerate ointment (VALISONE) 0.1 %, APPLY OINTMENT TOPICALLY TWO TIMES DAILY (Patient not taking: Reported on 02/22/2018), Disp: 15 g, Rfl: 0   Review of Systems  Constitutional: Positive for unexpected weight change. Negative for fever.  HENT: Negative for congestion, dental  problem, ear pain, nosebleeds, postnasal drip, rhinorrhea, sinus pressure, sneezing, sore throat and trouble swallowing.   Eyes: Negative for redness and itching.  Respiratory: Negative for cough, chest tightness, shortness of breath and wheezing.   Cardiovascular: Negative for palpitations and leg swelling.  Gastrointestinal: Negative for nausea and vomiting.  Genitourinary: Negative for dysuria.  Musculoskeletal: Negative for joint swelling.  Skin: Negative for rash.  Allergic/Immunologic: Negative.  Negative for environmental allergies, food allergies and immunocompromised state.  Neurological: Negative for headaches.  Hematological: Does not bruise/bleed easily.  Psychiatric/Behavioral: Negative for dysphoric mood. The patient is not nervous/anxious.        Objective:   Physical Exam  Constitutional: She is oriented to person, place, and time. She appears well-developed and well-nourished. No distress.  HENT:  Head: Normocephalic and atraumatic.  Right Ear: External  ear normal.  Left Ear: External ear normal.  Mouth/Throat: Oropharynx is clear and moist. No oropharyngeal exudate.  Eyes: Pupils are equal, round, and reactive to light. Conjunctivae and EOM are normal. Right eye exhibits no discharge. Left eye exhibits no discharge. No scleral icterus.  Neck: Normal range of motion. Neck supple. No JVD present. No tracheal deviation present. No thyromegaly present.  Cardiovascular: Normal rate, regular rhythm, normal heart sounds and intact distal pulses. Exam reveals no gallop and no friction rub.  No murmur heard. Pulmonary/Chest: Effort normal and breath sounds normal. No respiratory distress. She has no wheezes. She has no rales. She exhibits no tenderness.  Abdominal: Soft. Bowel sounds are normal. She exhibits no distension and no mass. There is no tenderness. There is no rebound and no guarding.  Musculoskeletal: Normal range of motion. She exhibits no edema or tenderness.    Lymphadenopathy:    She has no cervical adenopathy.  Neurological: She is alert and oriented to person, place, and time. She has normal reflexes. No cranial nerve deficit. She exhibits normal muscle tone. Coordination normal.  Skin: Skin is warm and dry. No rash noted. She is not diaphoretic. No erythema. No pallor.  Psychiatric: She has a normal mood and affect. Her behavior is normal. Judgment and thought content normal.  Vitals reviewed.   Vitals:   02/22/18 1600 02/22/18 1604  BP: (!) 148/80 (!) 148/80  Pulse:  79  SpO2:  97%  Weight: 212 lb 9.6 oz (96.4 kg) 212 lb 9.6 oz (96.4 kg)  Height: 5\' 3"  (1.6 m) 5\' 3"  (1.6 m)    Estimated body mass index is 37.66 kg/m as calculated from the following:   Height as of this encounter: 5\' 3"  (1.6 m).   Weight as of this encounter: 212 lb 9.6 oz (96.4 kg).       Assessment & Plan:     ICD-10-CM   1. Dyspnea on exertion R06.09   2. Rheumatoid arthritis, involving unspecified site, unspecified rheumatoid factor presence ([place]) M06.9   3. Severe obesity with body mass index (BMI) of 36.0 to 36.9 with serious comorbidity (HCC) E66.01    Z68.36       Shortness of breath is not fully explained Your oxygen level held good with walking but your heart rate jumped fast Need to rule out pulmonary fibrosis and pulmonary hypertension IF these are ruled out weight gain, stiff heart muscle (diastolic dysfunction) in setting of hypertension / obesity weight gain and physical deconditioning likely playing role  PLAN - do Pre-bd spiro and dlco only. No lung volume or bd response. No post-bd spiro - next few weeks - do HRCT supine and prone next few weeks - do ECHO next few weeks  Followup - return to see Dr Chase Caller or an APP next few weeks  - if tests are normal then we can discuss trial of pulmonary rehab v bike pulmonary stress test      Dr. Brand Males, M.D., [facility].C.P Pulmonary and Critical Care Medicine Staff Physician, [place] Director - Interstitial Lung Disease  Program  Pulmonary [place] [facility] [place], [place], [address]  Pager: [phone], If no answer or between  15:00h - 7:00h: call [phone] Telephone: [phone]

## 2018-03-01 ENCOUNTER — Institutional Professional Consult (permissible substitution): Payer: Medicare Other | Admitting: Internal Medicine

## 2018-03-10 ENCOUNTER — Other Ambulatory Visit: Payer: Self-pay

## 2018-03-10 ENCOUNTER — Ambulatory Visit (INDEPENDENT_AMBULATORY_CARE_PROVIDER_SITE_OTHER)
Admission: RE | Admit: 2018-03-10 | Discharge: 2018-03-10 | Disposition: A | Discharge status: Home / Self Care | Payer: Medicare Other | Source: Ambulatory Visit | Attending: Internal Medicine | Admitting: Internal Medicine

## 2018-03-10 ENCOUNTER — Ambulatory Visit (HOSPITAL_COMMUNITY): Payer: Medicare Other | Attending: Cardiology

## 2018-03-10 DIAGNOSIS — E785 Hyperlipidemia, unspecified: Secondary | ICD-10-CM | POA: Insufficient documentation

## 2018-03-10 DIAGNOSIS — I10 Essential (primary) hypertension: Secondary | ICD-10-CM | POA: Diagnosis not present

## 2018-03-10 DIAGNOSIS — I340 Nonrheumatic mitral (valve) insufficiency: Secondary | ICD-10-CM | POA: Diagnosis not present

## 2018-03-10 DIAGNOSIS — Z87891 Personal history of nicotine dependence: Secondary | ICD-10-CM | POA: Insufficient documentation

## 2018-03-10 DIAGNOSIS — R0609 Other forms of dyspnea: Secondary | ICD-10-CM | POA: Diagnosis not present

## 2018-03-10 DIAGNOSIS — R7303 Prediabetes: Secondary | ICD-10-CM | POA: Insufficient documentation

## 2018-03-11 ENCOUNTER — Ambulatory Visit (INDEPENDENT_AMBULATORY_CARE_PROVIDER_SITE_OTHER): Payer: Medicare Other | Admitting: Internal Medicine

## 2018-03-11 DIAGNOSIS — R0609 Other forms of dyspnea: Secondary | ICD-10-CM

## 2018-03-11 LAB — PULMONARY FUNCTION TEST
DL/VA % PRED: 102 %
DL/VA: 4.78 ml/min/mmHg/L
DLCO UNC % PRED: 82 %
DLCO unc: 18.8 ml/min/mmHg
FEF 25-75 Pre: 1.11 L/sec
FEF2575-%Pred-Pre: 79 %
FEV1-%Pred-Pre: 81 %
FEV1-PRE: 1.54 L
FEV1FVC-%Pred-Pre: 100 %
FEV6-%PRED-PRE: 86 %
FEV6-PRE: 2.06 L
FEV6FVC-%PRED-PRE: 106 %
FVC-%PRED-PRE: 81 %
FVC-PRE: 2.06 L
Pre FEV1/FVC ratio: 75 %
Pre FEV6/FVC Ratio: 100 %

## 2018-03-11 NOTE — Progress Notes (Signed)
Spirometry and Dlco done today. 

## 2018-03-15 ENCOUNTER — Encounter: Payer: Self-pay | Admitting: Internal Medicine

## 2018-03-15 ENCOUNTER — Ambulatory Visit (INDEPENDENT_AMBULATORY_CARE_PROVIDER_SITE_OTHER): Payer: Medicare Other | Admitting: Internal Medicine

## 2018-03-15 VITALS — BP 122/62 | HR 86 | Ht 63.0 in | Wt 213.0 lb

## 2018-03-15 DIAGNOSIS — J849 Interstitial pulmonary disease, unspecified: Secondary | ICD-10-CM

## 2018-03-15 DIAGNOSIS — R0609 Other forms of dyspnea: Secondary | ICD-10-CM

## 2018-03-15 DIAGNOSIS — I2510 Atherosclerotic heart disease of native coronary artery without angina pectoris: Secondary | ICD-10-CM | POA: Diagnosis not present

## 2018-03-15 DIAGNOSIS — R911 Solitary pulmonary nodule: Secondary | ICD-10-CM | POA: Diagnosis not present

## 2018-03-15 NOTE — Patient Instructions (Addendum)
ILD (interstitial lung disease) ([place]) - very early and mild because PFT test is normal but showing on CT - do not think this is driving your shortness of breath  - might have potential to worsen - so we need to keep an  Eye  - too early and too mild to warrant therapy that carries side effects  - defnitely needs monitoring - we will definitely need CT chest in 1y (has 58mm RUIL nodule anyways)  Coronary artery calcification seen on CAT scan - need to ensure this is not reason for shortness of breath  - refer Dr Johnsie Cancel or his team  Dyspnea on exertion - being driven largely by weight, physical deconditioning and back pain - can try water aerobics or pilates - try calling [employer] and ask for [place]     Followup 6 months for routine followup - will do simple walk test at that time   - will decideon followup CT chest at that time

## 2018-03-15 NOTE — Progress Notes (Signed)
Subjective:     Patient ID: [name], female   DOB: [birth year], 77 y.o.   MRN: [id number]  HPI  PCP Rachel Sanders, MD  HPI  IOV 02/22/2018   [name] presents on behalf of Dr. Leigh [name] and his team for evaluation of shortness of breath and concern for possible interstitial lung disease seen on chest x-ray.  History is given by the patient and review of the chart including old medical records and visualization of the films mentioned.  According to the patient she was diagnosed with rheumatoid arthritis not otherwise specified approximately 3 years ago.  For the first year she was on methotrexate which caused weight gain and itchiness and this was stopped.  Subsequently was on leflunomide which caused diarrhea and 16 pound weight loss and therefore had to be stopped in 2018.  Early in 2019 she was started on Enbrel which she believes is causing weight gain although when I asked her if her weight gain was just a reflection of her not being on [place] she conceded that it might not be the Enbrel directly causing the weight gain.  Nevertheless she has had weight gain with a significant and documented below.  Associated with his weight gain has been insidious onset of shortness of breath with exertion relieved by rest.  She notices shortness of breath when walking up an incline compared to her peers and she has to stop to rest.  There is no associated chest pain or proximal nocturnal dyspnea or orthopnea wheezing or cough or edema.  She says the most of the time she does not subjectively feel the dyspnea but then she is noticed to be visibly dyspneic by the family or onlookers.  Such was the case when she walked in our office today when she got tachycardic without any desaturation.  American College of chest physicians interstitial lung disease questionnaire  Symptoms: She has occasional cough but not bothersome.  Does not cough at night does not wake her up.  She walks slower  than people of her age.  Started 6 months ago  Past medical history: She says she has a "fat heart" although 2013 echocardiogram that I reviewed was normal.  She denies any weight loss or dysphagia or heartburn or acid reflux or dry eyes or chest pain or ongoing arthralgia  Personal exposure history: She never smoked any recreational drugs.  She started smoking at age 58 smoked half a pack a day and quit when she was 43.  Family history of lung disease: This emphysema in her brother but no family history of pulmonary fibrosis  Home environment history: He does not old house.  There is no humidifier or insomnia or hot tub or Jacuzzi or water damage.  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  Simple office walk 185 feet x  3 laps goal with forehead probe 02/22/2018   O2 used Room air  Number laps completed 3 all laps  Comments about pace Normal pace  Resting Pulse Ox/HR 98% and 85/min  Final Pulse Ox/HR 97% and 121/min  Desaturated </= 88% no  Desaturated <= 3% points no  Got  Tachycardic >/= 90/min yes  Symptoms at end of test Visibly Dyspneic and improved with rest. No chest pain or cough but no subjective dyspnea  Miscellaneous comments none     OV 03/15/2018  Chief Complaint  Patient presents with  . Follow-up    Echo and HRCT performed 6/6 and PFT performed 6/7.  Pt states she has been doing well since last visit and denies any complaints.   [name] returns for follow-up after doing work-up for shortness of breath in the setting of rheumatoid arthritis with a specific question of ruling out  interstitial lung disease   She returns with her husband as before.  This time she is accompanied by her daughter who lives in [place], [place].  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  This back pain preceded the diagnosis of rheumatoid arthritis.  It is relieved by rest.  Her work-up shows normal pulmonary function test including DLCO but the radiologist did call for high-resolution CT chest findings of indeterminate for UIP interstitial lung disease.  There is also an additional 4 mm right upper lobe nodule.  Her echocardiogram itself is normal.  There is an additional finding of coronary artery calcification on the CT chest.  We discussed all this in detail.    IMPRESSION: HRCT 1. Findings do suggest interstitial lung disease, but at this time, the CT pattern is considered indeterminate for usual interstitial pneumonia (UIP). Repeat high-resolution chest CT is suggested in 12 months to assess for temporal changes in the appearance of the lung parenchyma. 2. 4 mm right upper lobe pulmonary nodule. This is nonspecific but statistically likely benign. Attention at time of repeat high-resolution chest CT is recommended. 3. Aortic atherosclerosis, in addition to left main and left anterior descending coronary artery disease. Assessment for potential risk factor modification, dietary therapy or pharmacologic therapy may be warranted, if clinically indicated. 4. Hepatic steatosis.  Aortic Atherosclerosis (ICD10-I70.0).   Electronically Signed   By: [name] M.D.   On: 03/10/2018 12:02  PFT   Results for [name], [name] (MRN [id number]) as of 03/15/2018 10:03  Ref. Range 03/11/2018 11:38  FVC-Pre Latest Units: L 2.06  FVC-%Pred-Pre Latest Units: % 81  FEV1-Pre Latest Units: L 1.54  FEV1-%Pred-Pre  Latest Units: % 81  Pre FEV1/FVC ratio Latest Units: % 75  FEV1FVC-%Pred-Pre Latest Units: % 100  FEF 25-75 Pre Latest Units: L/sec 1.11  FEF2575-%Pred-Pre Latest Units: % 79  FEV6-Pre Latest Units: L 2.06  FEV6-%Pred-Pre Latest Units: % 86  Pre FEV6/FVC Ratio Latest Units: % 100  FEV6FVC-%Pred-Pre Latest Units: % 106  DLCO unc Latest Units: ml/min/mmHg 18.80  DLCO unc % pred Latest Units: % 82  DL/VA Latest Units: ml/min/mmHg/L 4.78  DL/VA % pred Latest Units: % 102     Study Conclusions - ECHO 03/10/18  - Left ventricle: The cavity size was normal. Systolic function was   normal. The estimated ejection fraction was in the range of 60%   to 65%. Wall motion was normal; there were no regional wall   motion abnormalities. Doppler parameters are consistent with   abnormal left ventricular relaxation (grade 1 diastolic   dysfunction). Doppler parameters are consistent with elevated   ventricular end-diastolic filling pressure. - Aortic valve: There was no regurgitation. - Mitral valve: Calcified annulus. Mildly thickened leaflets .  There was mild regurgitation. - Right ventricle: The cavity size was normal. Wall thickness was   normal. Systolic function was normal. - Pulmonary arteries: Systolic pressure was within the normal   range. - Inferior vena cava: The vessel was normal in size. - Pericardium, extracardiac: There was no pericardial effusion.   has a past medical history of Cataract, Dysrhythmia, GERD (gastroesophageal reflux disease), Hyperlipidemia, Hypertension, Migraines, Obesity, Osteoarthritis, and Phlebitis of leg, right, superficial (1986/1989).   reports that she quit smoking about 40 years ago. She has a 5.00 pack-year smoking history. She has never used smokeless tobacco.  Past Surgical History:  Procedure Laterality Date  . CARDIAC CATHETERIZATION  1990s   negative  . CARDIOVASCULAR STRESS TEST  2008   NML  . CARDIOVASCULAR STRESS TEST  02/02/2007   EF  70%, NO ISCHEMIA  . DILATION AND CURETTAGE, DIAGNOSTIC / THERAPEUTIC  2008  . LESION DESTRUCTION  10/2017   Face; Dr. Ronnald Ramp  . ROTATOR CUFF REPAIR  1999   Right, left in 2002  . 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 US ECHOCARDIOGRAPHY  02/07/2007   EF 55-60%    Allergies  Allergen Reactions  . Codeine     REACTION: Nausea and vomiting  . Hydrocodone-Homatropine Other (See Comments)    hallucinations    Immunization History  Administered Date(s) Administered  . Influenza,inj,Quad PF,6+ Mos 08/05/2017  . Pneumococcal Conjugate-13 07/10/2014  . Pneumococcal Polysaccharide-23 01/04/2008  . Td 10/05/1996, 01/04/2008  . Zoster 02/01/2008    Family History  Problem Relation Age of Onset  . Heart attack Father 12       PUD  . Hypertension Father   . Lung cancer Brother   . Heart defect Sister   . Hypertension Mother   . Coronary artery disease Unknown        Female 1st degree relative <50     Current Outpatient Medications:  .  etanercept (ENBREL) 50 MG/ML injection, Inject 50 mg into the skin once a week., Disp: , Rfl:  .  FOLIC ACID PO, Take 20 mg by mouth daily. , Disp: , Rfl:  .  hydrochlorothiazide (HYDRODIURIL) 25 MG tablet, TAKE ONE TABLET BY MOUTH ONE TIME DAILY , Disp: 90 tablet, Rfl: 1 .  meloxicam (MOBIC) 15 MG tablet, Take 15 mg by mouth daily. Daily prn, Disp: , Rfl:  .  betamethasone valerate ointment (VALISONE) 0.1 %, APPLY OINTMENT TOPICALLY TWO TIMES DAILY (Patient not taking: Reported on 02/22/2018), Disp: 15 g, Rfl: 0   Review of Systems     Objective:   Physical Exam Today's Vitals   03/15/18 0959  BP: 122/62  Pulse: 86  SpO2: 94%  Weight: 213 lb (96.6 kg)  Height: 5\' 3"  (1.6 m)    Estimated body mass index is 37.73 kg/m as calculated from the following:   Height as of this  encounter: 5\' 3"  (1.6 m).   Weight as of this encounter: 213 lb (96.6 kg). Discussion only visit    Assessment:       ICD-10-CM   1. Dyspnea on exertion R06.09   2. ILD (interstitial lung disease) ([place]) J84.9   3. Coronary artery calcification seen on CAT scan I25.10 Ambulatory referral to Cardiology  4. Nodule of upper lobe of right lung R91.1  Plan:      ILD (interstitial lung disease) ([place]) - very early and mild because PFT test is normal but showing on CT - do not think this is driving your shortness of breath  - might have potential to worsen - so we need to closely monitor  - too early and too mild to warrant therapy that carries side effects  - defnitely needs monitoring - - we will definitely need CT chest in 1y (has 69mm RUIL nodule anyways)  4 mm right upper lobe lung nodule -May/June 2019 -Follow-up CT chest in 1 year  Coronary artery calcification seen on CAT scan - need to ensure this is not reason for shortness of breath  - refer Dr Johnsie Cancel or his team  Dyspnea on exertion - being driven largely by weight, physical deconditioning and back pain - can try water aerobics or pilates - try calling [employer] and ask for [facility]   Followup 6 months for routine followup - will do simple walk test at that time   - will decideon followup CT chest at that time   > 50% of this > 25 min visit spent in face to face counseling or coordination of care    Dr. Brand Males, M.D., [facility].C.P Pulmonary and Critical Care Medicine Staff Physician, [place] Director - Interstitial Lung Disease  Program  Pulmonary [place] at [place], [place], [address]  Pager: [phone], If no answer or between  15:00h - 7:00h: call [phone] Telephone: [phone]

## 2018-05-11 ENCOUNTER — Ambulatory Visit: Payer: Medicare Other | Admitting: Cardiovascular Disease

## 2018-07-06 ENCOUNTER — Other Ambulatory Visit: Payer: Self-pay | Admitting: Family Medicine

## 2018-07-25 ENCOUNTER — Encounter: Payer: Self-pay | Admitting: Family Medicine

## 2018-08-04 ENCOUNTER — Ambulatory Visit (INDEPENDENT_AMBULATORY_CARE_PROVIDER_SITE_OTHER): Payer: Medicare Other

## 2018-08-04 DIAGNOSIS — Z23 Encounter for immunization: Secondary | ICD-10-CM

## 2018-08-08 NOTE — Progress Notes (Signed)
Cardiology Office Note   Date:  08/17/2018   ID:  Rachel, [name] [birth year], MRN [id number]  PCP:  [name], MD  Cardiologist:   Jenkins Rouge, MD   No chief complaint on file.     History of Present Illness: [name] is a 77 y.o. female who presents for consultation regarding dyspnea and CAD. Referred by Dr Elsworth Soho and Bevelyn Ngo.   Last seen by Dr Acie Fredrickson in 2013 for palpitations, HLD, HTN Chronically abnormal ECG with LVH and strain. TTE at that time was normal [facility] rheumatology Dr Amil Amen has been tried on multiple meds with side effects of weight gain and diarrhea currently on Enbrel  Seen  By Dr Elsworth Soho June as CT chest suggested ILD. 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 Also with 4 mm RUL nodule More dyspnea last few months with weight gain. No PND/Orthopnea No chest pain. Mild cough Worse going up hill  Reviewed TTE from 03/10/18 And EF 85-02% grade on diastolic dysfunction mild MR/MAC normal estimated PA pressures. CT scan also showed calcification of the LM and LAD arteries   Lab review from 11/04/17 LDL 137  A1c 6.1   She thinks issue is related to weight gain on enbrel and back pain   Past Medical History:  Diagnosis Date  . Cataract    diagnosed by Dr. Phineas Douglas  . Dysrhythmia    PSVT/ palpitations-  controlled with prn Inderal  . GERD (gastroesophageal reflux disease)   . Hyperlipidemia    statin intolerant  . Hypertension    eccho 4/13 EPIC  . Migraines   . Obesity   . Osteoarthritis   . Phlebitis of leg, right, superficial 1986/1989   x 2    Past Surgical History:  Procedure Laterality Date  . CARDIAC CATHETERIZATION  1990s   negative  . CARDIOVASCULAR STRESS TEST  2008   NML  . CARDIOVASCULAR STRESS TEST  02/02/2007   EF 70%, NO ISCHEMIA  . DILATION AND CURETTAGE, DIAGNOSTIC / THERAPEUTIC  2008  . LESION DESTRUCTION  10/2017   Face; Dr. Ronnald Ramp  . ROTATOR CUFF REPAIR  1999   Right, left in 2002  . TOTAL KNEE  ARTHROPLASTY  2008   Left  . 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 US ECHOCARDIOGRAPHY  02/07/2007   EF 55-60%     Current Outpatient Medications  Medication Sig Dispense Refill  . betamethasone valerate ointment (VALISONE) 0.1 % APPLY OINTMENT TOPICALLY TWO TIMES DAILY 15 g 0  . etanercept (ENBREL) 50 MG/ML injection Inject 50 mg into the skin once a week.    Marland Kitchen FOLIC ACID PO Take 20 mg by mouth daily.     . hydrochlorothiazide (HYDRODIURIL) 25 MG tablet TAKE 1 TABLET BY MOUTH ONCE DAILY 90 tablet 0  . meloxicam (MOBIC) 15 MG tablet Take 15 mg by mouth daily. Daily prn     No current facility-administered medications for this visit.     Allergies:   Codeine and Hydrocodone-homatropine    Social History:  The patient  reports that she quit smoking about 40 years ago. She has a 5.00 pack-year smoking history. She has never used smokeless tobacco. She reports that she does not drink alcohol or use drugs.   Family History:  The patient's family history includes Coronary artery disease in her unknown relative; Heart  attack (age of onset: 82) in her father; Heart defect in her sister; Hypertension in her father and mother; Lung cancer in her brother.    ROS:  Please see the history of present illness.   Otherwise, review of systems are positive for none.   All other systems are reviewed and negative.    PHYSICAL EXAM: VS:  BP 128/68   Pulse 87   Ht _0  (1.6 m)   Wt 209 lb (94.8 kg)   SpO2 98%   BMI 37.02 kg/m  , BMI Body mass index is 37.02 kg/m. Affect appropriate Healthy:  appears stated age 38: normal Neck supple with no adenopathy JVP normal no bruits no thyromegaly Lungs clear with no wheezing and good diaphragmatic motion Heart:  S1/S2 no murmur, no rub, gallop or click PMI normal Abdomen: benighn, BS positve, no tenderness, no AAA no  bruit.  No HSM or HJR Distal pulses intact with no bruits No edema Neuro non-focal Skin warm and dry No muscular weakness    EKG:  SR LVH with strain no change from 2013   Recent Labs: 11/04/2017: ALT 18; BUN 16; Creatinine, Ser 0.66; Potassium 4.2; Sodium 139    Lipid Panel    Component Value Date/Time   CHOL 206 (H) 11/04/2017 0827   TRIG 73.0 11/04/2017 0827   HDL 54.10 11/04/2017 0827   CHOLHDL 4 11/04/2017 0827   VLDL 14.6 11/04/2017 0827   LDLCALC 137 (H) 11/04/2017 0827   LDLDIRECT 151.5 08/17/2012 0901      Wt Readings from Last 3 Encounters:  08/17/18 209 lb (94.8 kg)  03/15/18 213 lb (96.6 kg)  02/22/18 212 lb 9.6 oz (96.4 kg)      Other studies Reviewed: Additional studies/ records that were reviewed today include: Notes Dr Acie Fredrickson 2013 TTE Notes primary and pulmonary Dr Elsworth Soho CT chest and recent TTE June 2019.    ASSESSMENT AND PLAN:  1.  Dyspnea:  Non cardiac with normal EF , no significant valve disease and normal estimated PA pressure on TTE June 2019 2. CAD:  Seen on CT part of UIP/ILD w/u outside chance exertional dyspnea may be anginal equivalent will order lexiscan myovue 3. RA:  Continue Enbrel f/u Dr Amil Amen  4. HLD discussed starting statin given CAD on CT and LDL of 137   5. DM:  Discussed low carb diet.  Target hemoglobin A1c is 6.5 or less.  Continue current medications. 6.   Well controlled.  Continue current medications and low sodium Dash type diet.   7. Abnormal ECG:  Chronic dating back to 2013 LVH with strain pattern although not seen on TTE see above r/o CAD      Current medicines are reviewed at length with the patient today.  The patient does not have concerns regarding medicines.  The following changes have been made:  None   Labs/ tests ordered today include: Lexiscan Myovue   Orders Placed This Encounter  Procedures  . MYOCARDIAL PERFUSION IMAGING     Disposition:   FU with cardiology in a year       Signed, Jenkins Rouge, MD  08/17/2018 10:05 AM    [address], [place], [place]  [address] Phone: [phone]; Fax: [phone]

## 2018-08-17 ENCOUNTER — Encounter: Payer: Self-pay | Admitting: Cardiovascular Disease

## 2018-08-17 ENCOUNTER — Ambulatory Visit (INDEPENDENT_AMBULATORY_CARE_PROVIDER_SITE_OTHER): Payer: Medicare Other | Admitting: Cardiovascular Disease

## 2018-08-17 VITALS — BP 128/68 | HR 87 | Ht 63.0 in | Wt 209.0 lb

## 2018-08-17 DIAGNOSIS — I2510 Atherosclerotic heart disease of native coronary artery without angina pectoris: Secondary | ICD-10-CM

## 2018-08-17 DIAGNOSIS — E785 Hyperlipidemia, unspecified: Secondary | ICD-10-CM | POA: Diagnosis not present

## 2018-08-17 DIAGNOSIS — R0600 Dyspnea, unspecified: Secondary | ICD-10-CM | POA: Diagnosis not present

## 2018-08-17 DIAGNOSIS — R9431 Abnormal electrocardiogram [ECG] [EKG]: Secondary | ICD-10-CM | POA: Diagnosis not present

## 2018-08-17 NOTE — Patient Instructions (Addendum)
Medication Instructions:   If you need a refill on your cardiac medications before your next appointment, please call your pharmacy.   Lab work:  If you have labs (blood work) drawn today and your tests are completely normal, you will receive your results only by: . MyChart Message (if you have MyChart) OR . A paper copy in the mail If you have any lab test that is abnormal or we need to change your treatment, we will call you to review the results.  Testing/Procedures: Your physician has requested that you have a lexiscan myoview. For further information please visit [email or web address]. Please follow instruction sheet, as given.  Follow-Up: At [facility], you and your health needs are our priority.  As part of our continuing mission to provide you with exceptional heart care, we have created designated Provider Care Teams.  These Care Teams include your primary Cardiologist (physician) and Advanced Practice Providers (APPs -  Physician Assistants and Nurse Practitioners) who all work together to provide you with the care you need, when you need it. Your physician recommends that you schedule a follow-up appointment as needed with Dr. Nishan.     

## 2018-08-25 NOTE — Addendum Note (Signed)
Addended by: Joaquim Lai on: 08/25/2018 11:57 AM   Modules accepted: Orders

## 2018-08-26 ENCOUNTER — Inpatient Hospital Stay (HOSPITAL_COMMUNITY): Admission: RE | Admit: 2018-08-26 | Payer: Medicare Other | Source: Ambulatory Visit

## 2018-09-17 ENCOUNTER — Other Ambulatory Visit: Payer: Self-pay | Admitting: Family Medicine

## 2018-10-05 DIAGNOSIS — Z923 Personal history of irradiation: Secondary | ICD-10-CM

## 2018-10-05 HISTORY — DX: Personal history of irradiation: Z92.3

## 2018-11-07 ENCOUNTER — Telehealth: Payer: Self-pay | Admitting: Family Medicine

## 2018-11-07 DIAGNOSIS — E785 Hyperlipidemia, unspecified: Secondary | ICD-10-CM

## 2018-11-07 DIAGNOSIS — R7303 Prediabetes: Secondary | ICD-10-CM

## 2018-11-07 NOTE — Telephone Encounter (Signed)
-----   Message from Eustace Pen, LPN sent at 0/04/1218  3:25 PM EST ----- Regarding: Labs 2/4 Lab orders needed. Thank you.

## 2018-11-08 ENCOUNTER — Other Ambulatory Visit (INDEPENDENT_AMBULATORY_CARE_PROVIDER_SITE_OTHER): Payer: Medicare Other

## 2018-11-08 ENCOUNTER — Ambulatory Visit: Payer: Medicare Other

## 2018-11-08 DIAGNOSIS — E785 Hyperlipidemia, unspecified: Secondary | ICD-10-CM | POA: Diagnosis not present

## 2018-11-08 DIAGNOSIS — R7303 Prediabetes: Secondary | ICD-10-CM

## 2018-11-08 LAB — COMPREHENSIVE METABOLIC PANEL
ALT: 24 U/L (ref 0–35)
AST: 16 U/L (ref 0–37)
Albumin: 4.2 g/dL (ref 3.5–5.2)
Alkaline Phosphatase: 77 U/L (ref 39–117)
BILIRUBIN TOTAL: 0.5 mg/dL (ref 0.2–1.2)
BUN: 22 mg/dL (ref 6–23)
CALCIUM: 9.5 mg/dL (ref 8.4–10.5)
CHLORIDE: 103 meq/L (ref 96–112)
CO2: 27 mEq/L (ref 19–32)
CREATININE: 0.69 mg/dL (ref 0.40–1.20)
GFR: 81.94 mL/min (ref >60.00)
Glucose, Bld: 96 mg/dL (ref 70–99)
Potassium: 4.1 mEq/L (ref 3.5–5.1)
Sodium: 139 mEq/L (ref 135–145)
Total Protein: 7.5 g/dL (ref 6.0–8.3)

## 2018-11-08 LAB — LIPID PANEL
CHOL/HDL RATIO: 4
Cholesterol: 210 mg/dL — ABNORMAL HIGH (ref 0–200)
HDL: 52.3 mg/dL (ref >39.00)
LDL CALC: 142 mg/dL — AB (ref 0–99)
NONHDL: 158.14
TRIGLYCERIDES: 80 mg/dL (ref 0.0–149.0)
VLDL: 16 mg/dL (ref 0.0–40.0)

## 2018-11-08 LAB — HEMOGLOBIN A1C: Hgb A1c MFr Bld: 5.9 % (ref 4.6–6.5)

## 2018-11-11 ENCOUNTER — Ambulatory Visit: Payer: Medicare Other

## 2018-11-15 ENCOUNTER — Encounter: Payer: Medicare Other | Admitting: Family Medicine

## 2018-11-24 ENCOUNTER — Encounter: Payer: Self-pay | Admitting: Family Medicine

## 2018-11-24 ENCOUNTER — Ambulatory Visit (INDEPENDENT_AMBULATORY_CARE_PROVIDER_SITE_OTHER): Payer: Medicare Other | Admitting: Family Medicine

## 2018-11-24 ENCOUNTER — Other Ambulatory Visit: Payer: Self-pay | Admitting: Family Medicine

## 2018-11-24 VITALS — BP 126/66 | HR 79 | Temp 97.8°F | Ht 63.0 in | Wt 211.8 lb

## 2018-11-24 DIAGNOSIS — R5382 Chronic fatigue, unspecified: Secondary | ICD-10-CM | POA: Diagnosis not present

## 2018-11-24 DIAGNOSIS — M05742 Rheumatoid arthritis with rheumatoid factor of left hand without organ or systems involvement: Secondary | ICD-10-CM

## 2018-11-24 DIAGNOSIS — E782 Mixed hyperlipidemia: Secondary | ICD-10-CM

## 2018-11-24 DIAGNOSIS — R5383 Other fatigue: Secondary | ICD-10-CM | POA: Insufficient documentation

## 2018-11-24 DIAGNOSIS — N3941 Urge incontinence: Secondary | ICD-10-CM | POA: Diagnosis not present

## 2018-11-24 DIAGNOSIS — I10 Essential (primary) hypertension: Secondary | ICD-10-CM | POA: Diagnosis not present

## 2018-11-24 DIAGNOSIS — Z0000 Encounter for general adult medical examination without abnormal findings: Secondary | ICD-10-CM

## 2018-11-24 DIAGNOSIS — R7303 Prediabetes: Secondary | ICD-10-CM

## 2018-11-24 DIAGNOSIS — M05741 Rheumatoid arthritis with rheumatoid factor of right hand without organ or systems involvement: Secondary | ICD-10-CM

## 2018-11-24 DIAGNOSIS — Z6836 Body mass index (BMI) 36.0-36.9, adult: Secondary | ICD-10-CM

## 2018-11-24 DIAGNOSIS — J849 Interstitial pulmonary disease, unspecified: Secondary | ICD-10-CM

## 2018-11-24 DIAGNOSIS — R911 Solitary pulmonary nodule: Secondary | ICD-10-CM

## 2018-11-24 LAB — POC URINALSYSI DIPSTICK (AUTOMATED)
Bilirubin, UA: NEGATIVE
Blood, UA: NEGATIVE
Glucose, UA: NEGATIVE
Ketones, UA: NEGATIVE
Nitrite, UA: NEGATIVE
Protein, UA: NEGATIVE
Spec Grav, UA: >=1.03 — AB (ref 1.010–1.025)
Urobilinogen, UA: 0.2 E.U./dL
pH, UA: 5 (ref 5.0–8.0)

## 2018-11-24 LAB — CBC WITH DIFFERENTIAL/PLATELET
Basophils Absolute: 0.1 10*3/uL (ref 0.0–0.1)
Basophils Relative: 0.9 % (ref 0.0–3.0)
EOS ABS: 0.3 10*3/uL (ref 0.0–0.7)
Eosinophils Relative: 3.1 % (ref 0.0–5.0)
HCT: 41.2 % (ref 36.0–46.0)
Hemoglobin: 13.5 g/dL (ref 12.0–15.0)
Lymphocytes Relative: 43.7 % (ref 12.0–46.0)
Lymphs Abs: 4 10*3/uL (ref 0.7–4.0)
MCHC: 32.9 g/dL (ref 30.0–36.0)
MCV: 95.5 fl (ref 78.0–100.0)
Monocytes Absolute: 0.8 10*3/uL (ref 0.1–1.0)
Monocytes Relative: 9 % (ref 3.0–12.0)
Neutro Abs: 4 10*3/uL (ref 1.4–7.7)
Neutrophils Relative %: 43.3 % (ref 43.0–77.0)
PLATELETS: 209 10*3/uL (ref 150.0–400.0)
RBC: 4.31 Mil/uL (ref 3.87–5.11)
RDW: 14.7 % (ref 11.5–15.5)
WBC: 9.2 10*3/uL (ref 4.0–10.5)

## 2018-11-24 LAB — TSH: TSH: 1.57 u[IU]/mL (ref 0.35–4.50)

## 2018-11-24 LAB — VITAMIN B12: Vitamin B-12: 402 pg/mL (ref 211–911)

## 2018-11-24 LAB — VITAMIN D 25 HYDROXY (VIT D DEFICIENCY, FRACTURES): VITD: 10.58 ng/mL — ABNORMAL LOW (ref 30.00–100.00)

## 2018-11-24 LAB — POCT UA - MICROSCOPIC ONLY

## 2018-11-24 LAB — T4, FREE: FREE T4: 0.78 ng/dL (ref 0.60–1.60)

## 2018-11-24 LAB — T3, FREE: T3, Free: 3.4 pg/mL (ref 2.3–4.2)

## 2018-11-24 MED ORDER — VITAMIN D3 1.25 MG (50000 UT) PO TABS: 50000.0000 [IU] | ORAL_TABLET | ORAL | 1 refills | Status: DC

## 2018-11-24 MED ORDER — MIRABEGRON ER 25 MG PO TB24: 25.0000 mg | ORAL_TABLET | Freq: Every day | ORAL | 3 refills | Status: DC

## 2018-11-24 NOTE — Assessment & Plan Note (Signed)
Encouraged exercise, weight loss, healthy eating habits. ? ?

## 2018-11-24 NOTE — Assessment & Plan Note (Addendum)
UA possibly contaminated but many LE.. send for culture. Start myrbetriq 25 mg daily increase if not effective after 1 month.

## 2018-11-24 NOTE — Assessment & Plan Note (Signed)
Improved on Enbrel.

## 2018-11-24 NOTE — Patient Instructions (Addendum)
Call Dr Johnsie Cancel to consider stress test.  Work on getting back to regualr exercise once cleared.  Please stop at the lab to have labs drawn. Start a low dose of myrbetric for urge incontinence. Call if symptoms not improved in 1 month for increase in dose. Call to set up repeat chest CT in 03/2019 to re-evaluate lung nodule for stability.

## 2018-11-24 NOTE — Assessment & Plan Note (Addendum)
Possible early mild per pulmonary. Seen on CT chest. Not clear cause of SOB.

## 2018-11-24 NOTE — Addendum Note (Signed)
Addended by: Eliezer Lofts E on: 11/24/2018 12:54 PM   Modules accepted: Orders

## 2018-11-24 NOTE — Assessment & Plan Note (Signed)
May be secondary to meds. May be due to nocturia, may improve with myrbetriq.  Eval with labs.  May be secondary to cardiac issue... encouraged pt to have stress test done to fully evaluate SOB.   If work up negative, given snoring, consider sleep eval for sleep apnea.

## 2018-11-24 NOTE — Assessment & Plan Note (Signed)
Re-eval due in 12 month.. 03/2019

## 2018-11-24 NOTE — Assessment & Plan Note (Signed)
Well controlled. Continue current medication.  

## 2018-11-24 NOTE — Progress Notes (Signed)
Subjective:    Patient ID: [name], female    DOB: 07/14/1941, 78 y.o.   MRN: [id number]  HPI  The patient presents for annual medicare wellness, complete physical and review of chronic health problems. He/She also has the following acute concerns today: fatigue   Advance directives and end of life planning reviewed in detail with patient and documented in EMR. Patient given handout on advance care directives if needed. HCPOA and living will updated if needed.  Fall Risk  11/24/2018 11/04/2017 10/06/2016 09/11/2015  Falls in the past year? 0 Yes No No  Comment - 4 falls with minor injury and no medical treatment - -  Number falls in past yr: - 2 or more - -    Hearing Screening   125Hz  250Hz  500Hz  1000Hz  2000Hz  3000Hz  4000Hz  6000Hz  8000Hz   Right ear:   20 20 20  20     Left ear:   20 25 20   0    Vision Screening Comments: Last eye exam, 04/2018    Office Visit from 11/24/2018 in [place] at [facility] Total Score  0      RA: She has been on Enbrel  Is helping with Ra symptoms.high.. No SE.  referred to [facility] rheum.  Causing chronic fatigue.  Hypertension:   Good control on HCTZ  Using medication without problems or lightheadedness:  none Chest pain with exertion: none Edema: None Short of breath: continue SOB.Marland Kitchen. seen by cardiology 08/2018 nml ECHO.Marland Kitchen does not want to do stress test   Dr Lavell Anchors  June as CT chest suggested ILD but  Early and mild . 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 Also with 4 mm RUL nodule needed follow up 1 year Average home BPs: Other issues:   prediabetes  Lab Results  Component Value Date   HGBA1C 5.9 11/08/2018   Elevated Cholesterol:  On no med. 12% 10 year risk for CVD per AHA risk calculator. Intolerant of statin as a class ... Even red yeast rice. Problem with zetia. Lab Results  Component Value Date   CHOL 210 (H) 11/08/2018   HDL 52.30 11/08/2018   LDLCALC 142 (H) 11/08/2018   LDLDIRECT 151.5 08/17/2012   TRIG 80.0 11/08/2018   CHOLHDL 4 11/08/2018  Using medications without problems: Muscle aches:  Diet compliance: moderate Exercise: minimal  Has gained weight with Enbrel. Other complaints:  She is having issues with  Urge incontinence. It is bothering her a lot.. has to use multiple depends daily.  Has to get up 2-3 times at night for urination.  No dysuria but urine smells bad in last year.  Fatigue.   She does snore.. has not had sleep test.   Social History /Family History/Past Medical History reviewed in detail and updated in EMR if needed. Blood pressure 126/66, pulse 79, temperature 97.8 F (36.6 C), temperature source Oral, height 5\' 3"  (1.6 m), weight 211 lb 12 oz (96 kg), SpO2 94 %.   Review of Systems  Constitutional: Negative for fatigue and fever.  HENT: Negative for congestion.   Eyes: Negative for pain.  Respiratory: Negative for cough and shortness of breath.   Cardiovascular: Negative for chest pain, palpitations and leg swelling.  Gastrointestinal: Negative for abdominal pain.  Genitourinary: Negative for dysuria and vaginal bleeding.  Musculoskeletal: Negative for back pain.  Neurological: Negative for syncope, light-headedness and headaches.  Psychiatric/Behavioral: Negative for dysphoric mood.       Objective:   Physical Exam  Constitutional:      General: She is not in acute distress.    Appearance: Normal appearance. She is well-developed. She is not ill-appearing or toxic-appearing.     Interventions: Face mask in place.     Comments: Overweight female in NAD  HENT:     Head: Normocephalic.     Right Ear: Hearing, tympanic membrane, ear canal and external ear normal.     Left Ear: Hearing, tympanic membrane, ear canal and external ear normal.     Nose: Nose normal.  Eyes:     General: Lids are normal. Lids are everted, no foreign bodies appreciated.     Conjunctiva/sclera: Conjunctivae normal.     Pupils: Pupils  are equal, round, and reactive to light.  Neck:     Musculoskeletal: Normal range of motion and neck supple.     Thyroid: No thyroid mass or thyromegaly.     Vascular: No carotid bruit.     Trachea: Trachea normal.  Cardiovascular:     Rate and Rhythm: Normal rate and regular rhythm.     Heart sounds: Normal heart sounds, S1 normal and S2 normal. No murmur. No gallop.   Pulmonary:     Effort: Pulmonary effort is normal. No respiratory distress.     Breath sounds: Normal breath sounds. No wheezing, rhonchi or rales.  Abdominal:     General: Bowel sounds are normal. There is no distension or abdominal bruit.     Palpations: Abdomen is soft. There is no fluid wave or mass.     Tenderness: There is no abdominal tenderness. There is no guarding or rebound.     Hernia: No hernia is present.  Lymphadenopathy:     Cervical: No cervical adenopathy.  Skin:    General: Skin is warm and dry.     Findings: No rash.  Neurological:     Mental Status: She is alert.     Cranial Nerves: No cranial nerve deficit.     Sensory: No sensory deficit.  Psychiatric:        Mood and Affect: Mood is not anxious or depressed.        Speech: Speech normal.        Behavior: Behavior normal. Behavior is cooperative.        Judgment: Judgment normal.           Assessment & Plan:  The patient's preventative maintenance and recommended screening tests for an annual wellness exam were reviewed in full today. Brought up to date unless services declined.  Counselled on the importance of diet, exercise, and its role in overall health and mortality. The patient's FH and SH was reviewed, including their home life, tobacco status, and drug and alcohol status.   Vaccines:Tdap due, uptodate otherwise except has had old shingles, can consider shingrix Pap/DVE:Not indicated Mammo:not indicated Bone Density:2019 normal, repeat in 5 years  Cologuard.: neg 2018,no further needed as will be 22 when next  due Smoking Status: former sm

## 2018-11-25 ENCOUNTER — Encounter: Payer: Self-pay | Admitting: *Deleted

## 2018-11-26 LAB — URINE CULTURE
MICRO NUMBER:: 221335
SPECIMEN QUALITY:: ADEQUATE

## 2018-11-28 MED ORDER — SULFAMETHOXAZOLE-TRIMETHOPRIM 800-160 MG PO TABS: 1.0000 | ORAL_TABLET | Freq: Two times a day (BID) | ORAL | 0 refills | Status: AC

## 2018-11-28 NOTE — Addendum Note (Signed)
Addended by: Owens Loffler on: 11/28/2018 10:27 AM   Modules accepted: Orders

## 2018-12-01 ENCOUNTER — Encounter: Payer: Self-pay | Admitting: *Deleted

## 2018-12-01 ENCOUNTER — Telehealth: Payer: Self-pay

## 2018-12-01 NOTE — Telephone Encounter (Signed)
Patient calls in to request that Butch Penny mail her her results and "what to take after she finishes treatment".  Also, if Butch Penny will include in the paperwork what type of infection she has.  Thanks.

## 2018-12-01 NOTE — Telephone Encounter (Signed)
Pt returned your call. Pt wants to know what type of UTI and instructions on how to take Vit D after she finishes 24 days of Vit D extra strength. Also, requesting to mail lab results to her home address. Address verified. She said please call if further questions.

## 2018-12-01 NOTE — Telephone Encounter (Signed)
Letter with urine culture results and Vit D instructions mailed to patient as requested.

## 2018-12-01 NOTE — Telephone Encounter (Signed)
Left message for [name] to return call.   Need to clarify exactly what she is wanting.

## 2018-12-15 ENCOUNTER — Ambulatory Visit: Payer: Medicare Other | Admitting: Family Medicine

## 2018-12-15 ENCOUNTER — Other Ambulatory Visit: Payer: Self-pay

## 2018-12-15 ENCOUNTER — Encounter: Payer: Self-pay | Admitting: Family Medicine

## 2018-12-15 VITALS — BP 130/60 | HR 91 | Temp 98.7°F | Ht 63.0 in | Wt 214.2 lb

## 2018-12-15 DIAGNOSIS — R3915 Urgency of urination: Secondary | ICD-10-CM

## 2018-12-15 DIAGNOSIS — E559 Vitamin D deficiency, unspecified: Secondary | ICD-10-CM | POA: Diagnosis not present

## 2018-12-15 DIAGNOSIS — R238 Other skin changes: Secondary | ICD-10-CM | POA: Diagnosis not present

## 2018-12-15 DIAGNOSIS — N3281 Overactive bladder: Secondary | ICD-10-CM

## 2018-12-15 LAB — POC URINALSYSI DIPSTICK (AUTOMATED)
Bilirubin, UA: NEGATIVE
Blood, UA: NEGATIVE
Glucose, UA: NEGATIVE
Ketones, UA: NEGATIVE
Leukocytes, UA: NEGATIVE
Nitrite, UA: NEGATIVE
Protein, UA: NEGATIVE
Spec Grav, UA: 1.025 (ref 1.010–1.025)
Urobilinogen, UA: 0.2 E.U./dL
pH, UA: 6 (ref 5.0–8.0)

## 2018-12-15 MED ORDER — TOLTERODINE TARTRATE ER 4 MG PO CP24: 4.0000 mg | ORAL_CAPSULE | Freq: Every day | ORAL | 3 refills | Status: DC

## 2018-12-15 MED ORDER — MOMETASONE FUROATE 0.1 % EX OINT: TOPICAL_OINTMENT | Freq: Two times a day (BID) | CUTANEOUS | 1 refills | Status: DC

## 2018-12-15 NOTE — Progress Notes (Signed)
Dr. Frederico Hamman T. Alphonza Tramell, MD, [facility] [place], [address] Phone: [phone] Fax: [phone]  12/15/2018  Patient: [name], MRN: [id number], DOB: 10/09/1940, 78 y.o.  Primary Physician:  [name], MD   Chief Complaint  Patient presents with  . Urinary Urgency   Subjective:   [name] is a 78 y.o. very pleasant female patient who presents with the following:  mometazone - irritation on perineal area, given by Dr. Ronnald Ramp in the past  Vit d - q's about vit d def  OA in feet.  They do hurt sometimes.   Urine clean -had a prior E. Coli UTI treated with Septra Overactive bladder, could not fill Myrbetriq due to cost  Past Medical History, Surgical History, Social History, Family History, Problem List, Medications, and Allergies have been reviewed and updated if relevant.  Patient Active Problem List   Diagnosis Date Noted  . Chronic fatigue 11/24/2018  . ILD (interstitial lung disease) ([place]) 11/24/2018  . Pulmonary nodule 11/24/2018  . Urge incontinence 11/11/2017  . Chronic diarrhea 05/11/2017  . Rheumatoid arthritis ([place]) 01/24/2016  . Counseling regarding end of life decision making 09/11/2015  . Chronic low back pain 09/11/2015  . Severe obesity with body mass index (BMI) of 36.0 to 36.9 with serious comorbidity ([place]) 10/12/2012  . 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/12/2012  . PSVT (paroxysmal supraventricular tachycardia) ([place]) 02/15/2012  . Prediabetes 01/10/2008  . Hyperlipidemia 01/04/2008  . Essential hypertension, benign 01/04/2008  . GERD 01/04/2008  . OSTEOARTHRITIS 01/04/2008  . SUPERFICIAL THROMBOPHLEBITIS 10/05/1988    Past Medical History:  Diagnosis Date  . Cataract    diagnosed by Dr. Phineas Douglas  . Dysrhythmia    PSVT/ palpitations-  controlled with prn Inderal  . GERD (gastroesophageal reflux disease)   . Hyperlipidemia    statin intolerant  . Hypertension    eccho 4/13  EPIC  . Migraines   . Obesity   . Osteoarthritis   . Phlebitis of leg, right, superficial 1986/1989   x 2    Past Surgical History:  Procedure Laterality Date  . CARDIAC CATHETERIZATION  1990s   negative  . CARDIOVASCULAR STRESS TEST  2008   NML  . CARDIOVASCULAR STRESS TEST  02/02/2007   EF 70%, NO ISCHEMIA  . DILATION AND CURETTAGE, DIAGNOSTIC / THERAPEUTIC  2008  . LESION DESTRUCTION  10/2017   Face; Dr. Ronnald Ramp  . ROTATOR CUFF REPAIR  1999   Right, left in 2002  . 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 US ECHOCARDIOGRAPHY  02/07/2007   EF 55-60%    Social History   Socioeconomic History  . Marital status: Married    Spouse name: Not on file  . Number of children: Not on file  . Years of education: Not on file  . Highest education level: Not on file  Occupational History  . Occupation: Retired  Scientific laboratory technician  . Financial resource strain: Not on file  . Food insecurity:    Worry: Not on file    Inability: Not on file  . Transportation needs:    Medical: Not on file    Non-medical: Not on file  Tobacco Use  . Smoking status: Former Smoker    Packs/day: 1.00    Years:  5.00    Pack years: 5.00    Last attempt to quit: 03/15/1978    Years since quitting: 40.7  . Smokeless tobacco: Never Used  . Tobacco comment: quit smoking 40 years ago  Substance and Sexual Activity  . Alcohol use: No    Comment: stopped 5 years ago  . Drug use: No  . Sexual activity: Yes  Lifestyle  . Physical activity:    Days per week: Not on file    Minutes per session: Not on file  . Stress: Not on file  Relationships  . Social connections:    Talks on phone: Not on file    Gets together: Not on file    Attends religious service: Not on file    Active member of club or organization: Not on file    Attends meetings of  clubs or organizations: Not on file    Relationship status: Not on file  . Intimate partner violence:    Fear of current or ex partner: Not on file    Emotionally abused: Not on file    Physically abused: Not on file    Forced sexual activity: Not on file  Other Topics Concern  . Not on file  Social History Narrative   Regular exercise: yes walks 1 mile a day   Diet: loves butter, fruit and veggies    Family History  Problem Relation Age of Onset  . Heart attack Father 77       PUD  . Hypertension Father   . Lung cancer Brother   . Heart defect Sister   . Hypertension Mother   . Coronary artery disease Unknown        Female 1st degree relative <50    Allergies  Allergen Reactions  . Codeine     REACTION: Nausea and vomiting  . Hydrocodone-Homatropine Other (See Comments)    hallucinations    Medication list reviewed and updated in full in [facility].  ROS: GEN: Acute illness details above GI: Tolerating PO intake GU: maintaining adequate hydration and urination Pulm: No SOB Interactive and getting along well at home.  Otherwise, ROS is as per the HPI.  Objective:   BP 130/60   Pulse 91   Temp 98.7 F (37.1 C) (Oral)   Ht 5\' 3"  (1.6 m)   Wt 214 lb 4 oz (97.2 kg)   BMI 37.95 kg/m    GEN: WDWN, NAD, Non-toxic, A & O x 3 HEENT: Atraumatic, Normocephalic. Neck supple. No masses, No LAD. Ears and Nose: No external deformity. CV: RRR, No M/G/R. No JVD. No thrill. No extra heart sounds. PULM: CTA B, no wheezes, crackles, rhonchi. No retractions. No resp. distress. No accessory muscle use. EXTR: No c/c/e NEURO Normal gait.  PSYCH: Normally interactive. Conversant. Not depressed or anxious appearing.  Calm demeanor.     Laboratory and Imaging Data: Results for orders placed or performed in visit on 12/15/18  POCT Urinalysis Dipstick (Automated)  Result Value Ref Range   Color, UA Yellow    Clarity, UA Hazy    Glucose, UA Negative Negative    Bilirubin, UA Negative    Ketones, UA Negative    Spec Grav, UA 1.025 1.010 - 1.025   Blood, UA Negative    pH, UA 6.0 5.0 - 8.0   Protein, UA Negative Negative   Urobilinogen, UA 0.2 0.2 or 1.0 E.U./dL   Nitrite, UA Negative    Leukocytes, UA Negative Negative  Assessment and Plan:   Overactive bladder  Urinary urgency - Plan: POCT Urinalysis Dipstick (Automated), Urine Culture  Skin irritation  Vitamin D deficiency  Trial of detrol  Elocon for irritation  Discuss vit d  Follow-up: No follow-ups on file.  Meds ordered this encounter  Medications  . tolterodine (DETROL LA) 4 MG 24 hr capsule    Sig: Take 1 capsule (4 mg total) by mouth daily.    Dispense:  30 capsule    Refill:  3  . mometasone (ELOCON) 0.1 % ointment    Sig: Apply topically 2 (two) times daily.    Dispense:  45 g    Refill:  1   Orders Placed This Encounter  Procedures  . Urine Culture  . POCT Urinalysis Dipstick (Automated)    Signed,  Alister Staver T. Auryn Paige, MD   Outpatient Encounter Medications as of 12/15/2018  Medication Sig  . betamethasone valerate ointment (VALISONE) 0.1 % APPLY OINTMENT TOPICALLY TWO TIMES DAILY  . Cholecalciferol (VITAMIN D3) 1.25 MG (50000 UT) TABS Take 50,000 Units by mouth once a week.  . etanercept (ENBREL) 50 MG/ML injection Inject 50 mg into the skin once a week.  Marland Kitchen FOLIC ACID PO Take 20 mg by mouth daily.   . hydrochlorothiazide (HYDRODIURIL) 25 MG tablet TAKE 1 TABLET BY MOUTH ONCE DAILY  . meloxicam (MOBIC) 15 MG tablet Take 15 mg by mouth daily. Daily prn  . mometasone (ELOCON) 0.1 % ointment Apply topically 2 (two) times daily.  Marland Kitchen tolterodine (DETROL LA) 4 MG 24 hr capsule Take 1 capsule (4 mg total) by mouth daily.  . [DISCONTINUED] mirabegron ER (MYRBETRIQ) 25 MG TB24 tablet Take 1 tablet (25 mg total) by mouth daily.   No facility-administered encounter medications on file as of 12/15/2018.

## 2018-12-16 LAB — URINE CULTURE
MICRO NUMBER:: 311721
SPECIMEN QUALITY:: ADEQUATE

## 2018-12-21 ENCOUNTER — Other Ambulatory Visit: Payer: Self-pay | Admitting: Family Medicine

## 2019-02-08 ENCOUNTER — Telehealth: Payer: Self-pay | Admitting: *Deleted

## 2019-02-08 NOTE — Telephone Encounter (Signed)
Cannot take more HCTZ 25 mg is max effective dose... have her make virtual visit for discussion and likely curbside labs

## 2019-02-08 NOTE — Telephone Encounter (Signed)
Patient called stating that she has noticed that her feet have been swelling for about a week and she has gained several pounds. Patient stated that otherwise she is doing okay no other symptoms such as SOB. Patient stated that she has been trying to walk more. Patient wants to know if she should take an extra fluid pill?  Advised patient when she is sitting around she should always keep her feet elevated which she stated that she has not been doing. Patient requested a call back regarding adjustments on her diuretic.

## 2019-02-09 ENCOUNTER — Other Ambulatory Visit (INDEPENDENT_AMBULATORY_CARE_PROVIDER_SITE_OTHER): Payer: Medicare Other

## 2019-02-09 ENCOUNTER — Ambulatory Visit (INDEPENDENT_AMBULATORY_CARE_PROVIDER_SITE_OTHER): Payer: Medicare Other | Admitting: Family Medicine

## 2019-02-09 ENCOUNTER — Encounter: Payer: Self-pay | Admitting: Family Medicine

## 2019-02-09 VITALS — Wt 218.0 lb

## 2019-02-09 DIAGNOSIS — R600 Localized edema: Secondary | ICD-10-CM | POA: Insufficient documentation

## 2019-02-09 DIAGNOSIS — R609 Edema, unspecified: Secondary | ICD-10-CM

## 2019-02-09 LAB — T4, FREE: Free T4: 0.85 ng/dL (ref 0.60–1.60)

## 2019-02-09 LAB — CBC WITH DIFFERENTIAL/PLATELET
Basophils Absolute: 0.1 10*3/uL (ref 0.0–0.1)
Basophils Relative: 1 % (ref 0.0–3.0)
Eosinophils Absolute: 0.4 10*3/uL (ref 0.0–0.7)
Eosinophils Relative: 4.6 % (ref 0.0–5.0)
HCT: 38.4 % (ref 36.0–46.0)
Hemoglobin: 12.9 g/dL (ref 12.0–15.0)
Lymphocytes Relative: 50.9 % — ABNORMAL HIGH (ref 12.0–46.0)
Lymphs Abs: 4.7 10*3/uL — ABNORMAL HIGH (ref 0.7–4.0)
MCHC: 33.7 g/dL (ref 30.0–36.0)
MCV: 93.7 fl (ref 78.0–100.0)
Monocytes Absolute: 0.7 10*3/uL (ref 0.1–1.0)
Monocytes Relative: 7.5 % (ref 3.0–12.0)
Neutro Abs: 3.3 10*3/uL (ref 1.4–7.7)
Neutrophils Relative %: 36 % — ABNORMAL LOW (ref 43.0–77.0)
Platelets: 198 10*3/uL (ref 150.0–400.0)
RBC: 4.09 Mil/uL (ref 3.87–5.11)
RDW: 14.3 % (ref 11.5–15.5)
WBC: 9.3 10*3/uL (ref 4.0–10.5)

## 2019-02-09 LAB — COMPREHENSIVE METABOLIC PANEL
ALT: 23 U/L (ref 0–35)
AST: 29 U/L (ref 0–37)
Albumin: 4 g/dL (ref 3.5–5.2)
Alkaline Phosphatase: 74 U/L (ref 39–117)
BUN: 17 mg/dL (ref 6–23)
CO2: 31 mEq/L (ref 19–32)
Calcium: 8.9 mg/dL (ref 8.4–10.5)
Chloride: 102 mEq/L (ref 96–112)
Creatinine, Ser: 0.66 mg/dL (ref 0.40–1.20)
GFR: 86.2 mL/min (ref >60.00)
Glucose, Bld: 112 mg/dL — ABNORMAL HIGH (ref 70–99)
Potassium: 3.5 mEq/L (ref 3.5–5.1)
Sodium: 140 mEq/L (ref 135–145)
Total Bilirubin: 0.4 mg/dL (ref 0.2–1.2)
Total Protein: 7 g/dL (ref 6.0–8.3)

## 2019-02-09 LAB — T3, FREE: T3, Free: 3.2 pg/mL (ref 2.3–4.2)

## 2019-02-09 LAB — BRAIN NATRIURETIC PEPTIDE: Pro B Natriuretic peptide (BNP): 39 pg/mL (ref 0.0–100.0)

## 2019-02-09 LAB — TSH: TSH: 2.6 u[IU]/mL (ref 0.35–4.50)

## 2019-02-09 NOTE — Assessment & Plan Note (Signed)
Appears most consistent with venous insufficiency. No clear new cardiopulmonary change. Will eval for secondary etiology... with labs including CMET, thyroid, BNP etc. Continue HCTZ... may need to change to lasix temporarily.   Encouraged pt to decrease salt, increase activity, elevate feet above heart, start wearing compression hose.

## 2019-02-09 NOTE — Telephone Encounter (Signed)
Appointment 5/7 pt aware

## 2019-02-09 NOTE — Progress Notes (Signed)
Appointment 5/7 @ 11:45 Pt aware

## 2019-02-09 NOTE — Progress Notes (Signed)
VIRTUAL VISIT Due to national recommendations of social distancing due to [place] 19, a virtual visit is felt to be most appropriate for this patient at this time.   I connected with the patient on 02/09/19 at 10:20 AM EDT by virtual telehealth platform and verified that I am speaking with the correct person using two identifiers.   I discussed the limitations, risks, security and privacy concerns of performing an evaluation and management service by  virtual telehealth platform and the availability of in person appointments. I also discussed with the patient that there may be a patient responsible charge related to this service. The patient expressed understanding and agreed to proceed.  Patient location: Home Provider Location: [facility] Participants: [name] and [name]   Chief Complaint  Patient presents with  . Leg Swelling    History of Present Illness: 78 year old female presents with 4 days of increased swelling... she has had 4 lb weight gain in last week. Improved in AMs and worsens through the day. No pain in legs just tight. Left worse than right. RA  More in right foot than left. She feels well otherwise.. very good in fact. No Chest pain, no Shortness of breath. Has not been checking BPs.  No redness or sign of infection in either leg.   No med change, no diet change, does have some salt in diet. No ETOH.  She has been less active lately around house.... but has started doing daily walking in last few weeks.  ON HCTZ daily 25 mg daily.    COVID 19 screen No recent travel or known exposure to COVID19 The patient denies respiratory symptoms of COVID 19 at this time.  The importance of social distancing was discussed today.   Review of Systems  Constitutional: Negative for chills and fever.  HENT: Negative for congestion and ear pain.   Eyes: Negative for pain and redness.  Respiratory: Negative for cough and shortness of breath.    Cardiovascular: Negative for chest pain, palpitations and leg swelling.  Gastrointestinal: Negative for abdominal pain, blood in stool, constipation, diarrhea, nausea and vomiting.  Genitourinary: Negative for dysuria.  Musculoskeletal: Negative for falls and myalgias.  Skin: Negative for rash.  Neurological: Negative for dizziness.  Psychiatric/Behavioral: Negative for depression. The patient is not nervous/anxious.       Past Medical History:  Diagnosis Date  . Cataract    diagnosed by Dr. Phineas Douglas  . Dysrhythmia    PSVT/ palpitations-  controlled with prn Inderal  . GERD (gastroesophageal reflux disease)   . Hyperlipidemia    statin intolerant  . Hypertension    eccho 4/13 EPIC  . Migraines   . Obesity   . Osteoarthritis   . Phlebitis of leg, right, superficial 1986/1989   x 2    reports that she quit smoking about 40 years ago. She has a 5.00 pack-year smoking history. She has never used smokeless tobacco. She reports that she does not drink alcohol or use drugs.   Current Outpatient Medications:  .  betamethasone valerate ointment (VALISONE) 0.1 %, APPLY OINTMENT TOPICALLY TWO TIMES DAILY, Disp: 15 g, Rfl: 0 .  Cholecalciferol (VITAMIN D3) 1.25 MG (50000 UT) TABS, Take 50,000 Units by mouth once a week., Disp: 12 tablet, Rfl: 1 .  etanercept (ENBREL) 50 MG/ML injection, Inject 50 mg into the skin once a week., Disp: , Rfl:  .  FOLIC ACID PO, Take 20 mg by mouth daily. , Disp: , Rfl:  .  hydrochlorothiazide (HYDRODIURIL) 25 MG tablet, TAKE ONE TABLET BY MOUTH ONE TIME DAILY , Disp: 90 tablet, Rfl: 1 .  meloxicam (MOBIC) 15 MG tablet, Take 15 mg by mouth daily. Daily prn, Disp: , Rfl:  .  mometasone (ELOCON) 0.1 % ointment, Apply topically 2 (two) times daily., Disp: 45 g, Rfl: 1 .  tolterodine (DETROL LA) 4 MG 24 hr capsule, Take 1 capsule (4 mg total) by mouth daily., Disp: 30 capsule, Rfl: 3   Observations/Objective: There were no vitals taken for this visit.  Physical  Exam  Physical Exam Constitutional:      General: The patient is not in acute distress. Pulmonary:     Effort: Pulmonary effort is normal. No respiratory distress.  Neurological:     Mental Status: The patient is alert and oriented to person, place, and time.  Psychiatric:        Mood and Affect: Mood normal.        Behavior: Behavior normal.  Bilateral 1 plus edema.Marland Kitchen appears pitting Varicose veins bialterally  Assessment and Plan Peripheral edema Appears most consistent with venous insufficiency. No clear new cardiopulmonary change. Will eval for secondary etiology... with labs including CMET, thyroid, BNP etc. Continue HCTZ... may need to change to lasix temporarily.   Encouraged pt to decrease salt, increase activity, elevate feet above heart, start wearing compression hose.     I discussed the assessment and treatment plan with the patient. The patient was provided an opportunity to ask questions and all were answered. The patient agreed with the plan and demonstrated an understanding of the instructions.   The patient was advised to call back or seek an in-person evaluation if the symptoms worsen or if the condition fails to improve as anticipated.     Rachel Lofts, MD

## 2019-02-10 MED ORDER — POTASSIUM CHLORIDE CRYS ER 20 MEQ PO TBCR: 20.0000 meq | EXTENDED_RELEASE_TABLET | Freq: Every day | ORAL | 0 refills | Status: DC

## 2019-02-10 NOTE — Addendum Note (Signed)
Addended by: Carter Kitten on: 02/10/2019 09:35 AM   Modules accepted: Orders

## 2019-02-13 ENCOUNTER — Telehealth: Payer: Self-pay

## 2019-02-13 NOTE — Telephone Encounter (Signed)
Pt left v/m that her husband was just taken to ED with ? Heart attack; pt requested appt for FU swollen ft with Dr Diona Browner on 02/14/19 at 10:40 to be cancelled and wants Butch Penny CMA to be aware.

## 2019-02-13 NOTE — Telephone Encounter (Signed)
Noted  

## 2019-02-14 ENCOUNTER — Ambulatory Visit: Payer: Medicare Other | Admitting: Family Medicine

## 2019-02-14 ENCOUNTER — Ambulatory Visit (INDEPENDENT_AMBULATORY_CARE_PROVIDER_SITE_OTHER): Payer: Medicare Other | Admitting: Family Medicine

## 2019-02-14 ENCOUNTER — Encounter: Payer: Self-pay | Admitting: Family Medicine

## 2019-02-14 DIAGNOSIS — R609 Edema, unspecified: Secondary | ICD-10-CM

## 2019-02-14 NOTE — Telephone Encounter (Signed)
Pt left v/m and pts husband came home last night and pt wants to reschedule appt she cancelled for 02/14/19 at 10:40; done; pt notified and voiced understanding. FYI to [facility] CMA

## 2019-02-14 NOTE — Progress Notes (Signed)
VIRTUAL VISIT Due to national recommendations of social distancing due to [place] 19, a virtual visit is felt to be most appropriate for this patient at this time.   I connected with the patient on 02/14/19 at 10:40 AM EDT by virtual telehealth platform and verified that I am speaking with the correct person using two identifiers.   I discussed the limitations, risks, security and privacy concerns of performing an evaluation and management service by  virtual telehealth platform and the availability of in person appointments. I also discussed with the patient that there may be a patient responsible charge related to this service. The patient expressed understanding and agreed to proceed.  Patient location: Home Provider Location: [facility] Participants: Eliezer Lofts and [name]   Chief Complaint  Patient presents with  . Edema    follow up. Swelling is better.    History of Present Illness: 78 year old female presents for follow up peripheral edema.   CBC, CMET, BNP, thyroid: were normal on 02/09/2019.  Felt likely due to venous insufficiency and inactivity.  Started on 3 days of lasix  and KCL in place of HCTZ. She did note note significant increase in UOP. Has lost about 1.5 lb. Non pitting edema.   She has been elevating feet and decreased salt.  She has not started compression hose.   COVID 19 screen No recent travel or known exposure to COVID19 The patient denies respiratory symptoms of COVID 19 at this time.  The importance of social distancing was discussed today.   ROS    Past Medical History:  Diagnosis Date  . Cataract    diagnosed by Dr. Phineas Douglas  . Dysrhythmia    PSVT/ palpitations-  controlled with prn Inderal  . GERD (gastroesophageal reflux disease)   . Hyperlipidemia    statin intolerant  . Hypertension    eccho 4/13 EPIC  . Migraines   . Obesity   . Osteoarthritis   . Phlebitis of leg, right, superficial 1986/1989   x 2    reports that she quit smoking about 40 years ago. She has a 5.00 pack-year smoking history. She has never used smokeless tobacco. She reports that she does not drink alcohol or use drugs.   Current Outpatient Medications:  .  Cholecalciferol (VITAMIN D3) 1.25 MG (50000 UT) TABS, Take 50,000 Units by mouth once a week., Disp: 12 tablet, Rfl: 1 .  etanercept (ENBREL) 50 MG/ML injection, Inject 50 mg into the skin once a week., Disp: , Rfl:  .  FOLIC ACID PO, Take 20 mg by mouth daily. , Disp: , Rfl:  .  hydrochlorothiazide (HYDRODIURIL) 25 MG tablet, TAKE ONE TABLET BY MOUTH ONE TIME DAILY , Disp: 90 tablet, Rfl: 1 .  mometasone (ELOCON) 0.1 % ointment, Apply topically 2 (two) times daily., Disp: 45 g, Rfl: 1 .  furosemide (LASIX) 20 MG tablet, Take 20 mg by mouth daily., Disp: , Rfl:  .  meloxicam (MOBIC) 15 MG tablet, Take 15 mg by mouth daily. Daily prn, Disp: , Rfl:  .  potassium chloride SA (K-DUR) 20 MEQ tablet, Take 1 tablet (20 mEq total) by mouth daily. for 3 days only (Patient not taking: Reported on 02/14/2019), Disp: 10 tablet, Rfl: 0   Observations/Objective: There were no vitals taken for this visit.  Physical Exam  Physical Exam Constitutional:      General: The patient is not in acute distress. Pulmonary:     Effort: Pulmonary effort is normal. No respiratory  distress.  Neurological:     Mental Status: The patient is alert and oriented to person, place, and time.  Psychiatric:        Mood and Affect: Mood normal.        Behavior: Behavior normal.  Less edema bilaterally.Marland Kitchen nonpitting per pt.  Assessment and Plan   Peripheral edema Due to venous insufficieny.. improved with 34 days lasix. Now back on HCTZ.   Encouraged compression, hose,elevation and low salt diet.   I discussed the assessment and treatment plan with the patient. The patient was provided an opportunity to ask questions and all were answered. The patient agreed with the plan and demonstrated an  understanding of the instructions.   The patient was advised to call back or seek an in-person evaluation if the symptoms worsen or if the condition fails to improve as anticipated.     Eliezer Lofts, MD

## 2019-02-14 NOTE — Assessment & Plan Note (Signed)
Due to venous insufficieny.. improved with 34 days lasix. Now back on HCTZ.   Encouraged compression, hose,elevation and low salt diet.

## 2019-04-17 ENCOUNTER — Telehealth: Payer: Self-pay | Admitting: Family Medicine

## 2019-04-17 NOTE — Telephone Encounter (Signed)
Spoke to patient by telephone and was advised that she was told some time ago to take Furosemide 20 mg and potassium for 3 days if she had a lot of swell. Patient stated that she tried it about a month ago and it did not help the swelling. Patient stated that she is having swelling again and took a Furosemide today and will take for 3 days. Patient stated that she takes hydrochlorothiazide the days that she Is not taking the Furosemide. Patient stated that she is trying the furosemide again and wanted to make sure that is what she is suppose to do? Patient wanted to let Dr. Diona Browner know it did not have the swelling when she took it last month. [place]

## 2019-04-17 NOTE — Telephone Encounter (Signed)
Patient would like a call back to discuss medications she was advised to take this for swelling in her legs. She stated she was told to start taking the newly prescribed Diuretic medication and stop taking the old one she was previously taking.   She is needing some clarification on these medications.   Best C/B # [phone]

## 2019-04-18 NOTE — Telephone Encounter (Signed)
Agree with trial of LAsix x 3 days . Follow daily  weights and call with update after 3 day course compelted

## 2019-04-19 NOTE — Telephone Encounter (Signed)
Left message at home number for patient to call back.

## 2019-04-19 NOTE — Telephone Encounter (Signed)
Pt called back and notified as instructed. Today is 3rd day of taking the Lasix and pt said she will cb on 04/20/19 with update.

## 2019-04-20 NOTE — Telephone Encounter (Signed)
Noted  

## 2019-04-20 NOTE — Telephone Encounter (Signed)
Patient called today to give update on how she is doing  Patient stated Monday thru Wednesday she took the medication that she was advised to take (Potassium and Furosemide) She stated she urinated a lot. But towards the afternoon it stopped.  She stated that this a/m it was much heavier. Patient stated when she pushed into her leg the skin does not stay in like it was before and is doing much better.    Best C/B # [phone]

## 2019-06-27 ENCOUNTER — Other Ambulatory Visit: Payer: Self-pay

## 2019-06-27 ENCOUNTER — Ambulatory Visit (INDEPENDENT_AMBULATORY_CARE_PROVIDER_SITE_OTHER): Payer: Medicare Other | Admitting: Family Medicine

## 2019-06-27 ENCOUNTER — Encounter: Payer: Self-pay | Admitting: Family Medicine

## 2019-06-27 VITALS — BP 120/60 | HR 88 | Temp 98.3°F | Ht 63.0 in | Wt 218.2 lb

## 2019-06-27 DIAGNOSIS — R8290 Unspecified abnormal findings in urine: Secondary | ICD-10-CM

## 2019-06-27 DIAGNOSIS — N6452 Nipple discharge: Secondary | ICD-10-CM | POA: Diagnosis not present

## 2019-06-27 DIAGNOSIS — D0511 Intraductal carcinoma in situ of right breast: Secondary | ICD-10-CM | POA: Insufficient documentation

## 2019-06-27 DIAGNOSIS — R42 Dizziness and giddiness: Secondary | ICD-10-CM | POA: Diagnosis not present

## 2019-06-27 DIAGNOSIS — Z23 Encounter for immunization: Secondary | ICD-10-CM | POA: Diagnosis not present

## 2019-06-27 LAB — POC URINALSYSI DIPSTICK (AUTOMATED)
Bilirubin, UA: NEGATIVE
Blood, UA: NEGATIVE
Glucose, UA: NEGATIVE
Ketones, UA: NEGATIVE
Nitrite, UA: NEGATIVE
Protein, UA: NEGATIVE
Spec Grav, UA: >=1.03 — AB (ref 1.010–1.025)
Urobilinogen, UA: 0.2 E.U./dL
pH, UA: 6 (ref 5.0–8.0)

## 2019-06-27 NOTE — Assessment & Plan Note (Signed)
Concern for cancerous changes of nipple vs eczema... refer for mammogram and [place] right breast.  Doubt abscess... given no pain, no surrounding erythema or warmth.

## 2019-06-27 NOTE — Assessment & Plan Note (Signed)
Most likely due to hyperconcentration.. increase water intake ( she only drinks caffeinated tea). Most likely contaminated urine sample.. but send  For culture to verify.

## 2019-06-27 NOTE — Progress Notes (Signed)
Chief Complaint  Patient presents with  . Bleeding/Bruising    from breast x1 occasion on sunday  . abnormal odor to urine  . Dizziness    History of Present Illness: HPI  78 year old female with mulitple complaints  1.: One episode of bloody discharge from right breast... over the weekend. No further.  no soreness or pain.  no mass.  She is on Embrel which makes her skin dry and itchy but she has not note dryness or rash on nipple. No family of breast cancer.  2011 last mammogram.   2.abnormal odor to urine going on for several weeks.  she has some chronic incontinence, frequency and urgency.. No dysuria. Her urine is very concentrated  12/2018: neg  3. Dizziness  In last week... balance off, no vertigo. Also noted some head pressure and sneeze occ... associated with allergies. Somewhat a chronic in nature,  Has had in past. She has been under more stress lately. BP Readings from Last 3 Encounters:  06/27/19 120/60  12/15/18 130/60  11/24/18 126/66    Improved  Edema. Not using lasix.   COVID 19 screen No recent travel or known exposure to COVID19 The patient denies respiratory symptoms of COVID 19 at this time.  The importance of social distancing was discussed today.   Review of Systems  Constitutional: Negative for chills and fever.  HENT: Positive for sinus pain. Negative for congestion and ear pain.   Eyes: Negative for pain and redness.  Respiratory: Negative for cough and shortness of breath.   Cardiovascular: Negative for chest pain, palpitations and leg swelling.  Gastrointestinal: Negative for abdominal pain, blood in stool, constipation, diarrhea, nausea and vomiting.  Genitourinary: Positive for frequency and urgency. Negative for dysuria, flank pain and hematuria.  Musculoskeletal: Negative for falls and myalgias.  Skin: Negative for rash.  Neurological: Negative for dizziness.  Psychiatric/Behavioral: Negative for depression. The patient is not  nervous/anxious.       Past Medical History:  Diagnosis Date  . Cataract    diagnosed by Dr. Phineas Douglas  . Dysrhythmia    PSVT/ palpitations-  controlled with prn Inderal  . GERD (gastroesophageal reflux disease)   . Hyperlipidemia    statin intolerant  . Hypertension    eccho 4/13 EPIC  . Migraines   . Obesity   . Osteoarthritis   . Phlebitis of leg, right, superficial 1986/1989   x 2    reports that she quit smoking about 41 years ago. She has a 5.00 pack-year smoking history. She has never used smokeless tobacco. She reports that she does not drink alcohol or use drugs.   Current Outpatient Medications:  .  etanercept (ENBREL) 50 MG/ML injection, Inject 50 mg into the skin once a week., Disp: , Rfl:  .  FOLIC ACID PO, Take 20 mg by mouth daily. , Disp: , Rfl:  .  hydrochlorothiazide (HYDRODIURIL) 25 MG tablet, TAKE ONE TABLET BY MOUTH ONE TIME DAILY , Disp: 90 tablet, Rfl: 1 .  mometasone (ELOCON) 0.1 % ointment, Apply topically 2 (two) times daily., Disp: 45 g, Rfl: 1   Observations/Objective: Blood pressure 120/60, pulse 88, temperature 98.3 F (36.8 C), temperature source Temporal, height 5\' 3"  (1.6 m), weight 218 lb 4 oz (99 kg), SpO2 95 %.  Physical Exam Constitutional:      General: She is not in acute distress.    Appearance: Normal appearance. She is well-developed. She is not ill-appearing or toxic-appearing.  HENT:     Head:  Normocephalic.     Right Ear: Hearing, tympanic membrane, ear canal and external ear normal. Tympanic membrane is not erythematous, retracted or bulging.     Left Ear: Hearing, tympanic membrane, ear canal and external ear normal. Tympanic membrane is not erythematous, retracted or bulging.     Nose: No mucosal edema or rhinorrhea.     Right Sinus: No maxillary sinus tenderness or frontal sinus tenderness.     Left Sinus: No maxillary sinus tenderness or frontal sinus tenderness.     Mouth/Throat:     Pharynx: Uvula midline.  Eyes:      General: Lids are normal. Lids are everted, no foreign bodies appreciated.     Conjunctiva/sclera: Conjunctivae normal.     Pupils: Pupils are equal, round, and reactive to light.  Neck:     Musculoskeletal: Normal range of motion and neck supple.     Thyroid: No thyroid mass or thyromegaly.     Vascular: No carotid bruit.     Trachea: Trachea normal.  Cardiovascular:     Rate and Rhythm: Normal rate and regular rhythm.     Pulses: Normal pulses.     Heart sounds: Normal heart sounds, S1 normal and S2 normal. No murmur. No friction rub. No gallop.   Pulmonary:     Effort: Pulmonary effort is normal. No tachypnea or respiratory distress.     Breath sounds: Normal breath sounds. No decreased breath sounds, wheezing, rhonchi or rales.  Chest:     Breasts: Breasts are asymmetrical.        Right: Nipple discharge and skin change present. No mass or tenderness.        Left: Inverted nipple present. No mass, nipple discharge, skin change or tenderness.     Comments: Right nipple with firm nodule at tip of nipple, unable to express.... no heat, no surrounding erythema, no increase warmth Abdominal:     General: Bowel sounds are normal.     Palpations: Abdomen is soft.     Tenderness: There is no abdominal tenderness.  Skin:    General: Skin is warm and dry.     Findings: No rash.  Neurological:     Mental Status: She is alert.  Psychiatric:        Mood and Affect: Mood is not anxious or depressed.        Speech: Speech normal.        Behavior: Behavior normal. Behavior is cooperative.        Thought Content: Thought content normal.        Judgment: Judgment normal.      Assessment and Plan   Bloody discharge from right nipple Concern for cancerous changes of nipple vs eczema... refer for mammogram and [place] right breast.  Doubt abscess... given no pain, no surrounding erythema or warmth.  Abnormal urine odor Most likely due to hyperconcentration.. increase water intake ( she only  drinks caffeinated tea). Most likely contaminated urine sample.. but send  For culture to verify.  Dizziness Recurrent.. likely due to allergies.Marland Kitchen treat with flonase.     Eliezer Lofts, MD

## 2019-06-27 NOTE — Patient Instructions (Addendum)
Start flonase 2 sprays per nostril for allergy symptoms and dizziness.  We will call with culture results.  We will call to set up mammogram and breast US.  Increase water intake and wear compression hose on legs for any swelling.

## 2019-06-27 NOTE — Assessment & Plan Note (Signed)
Recurrent.. likely due to allergies.Marland Kitchen treat with flonase.

## 2019-06-28 ENCOUNTER — Telehealth: Payer: Self-pay

## 2019-06-28 NOTE — Telephone Encounter (Signed)
Diagnostic MMG and [place] has been scheduled and patient is aware.

## 2019-06-28 NOTE — Telephone Encounter (Signed)
Pt had visit on 06/28/19 and pt was having bloody discharge from rt nipple; pt wants to know when the diagnostic mammogram and [place] of rt breast is going to be. [place]pt does not have a specific date to go out of town; pt said the mammogram and [place] are her top priority due to she is concerned and wants to get completed ASAP. Pt will wait to hear back from [facility] about appt. Pt wants to have mammo and [place] where she had the mammo last time.Please advise. Forward to [facility].

## 2019-06-29 LAB — URINE CULTURE
MICRO NUMBER:: 908691
SPECIMEN QUALITY:: ADEQUATE

## 2019-06-30 ENCOUNTER — Other Ambulatory Visit: Payer: Self-pay | Admitting: Family Medicine

## 2019-06-30 MED ORDER — CEPHALEXIN 500 MG PO CAPS: 500.0000 mg | ORAL_CAPSULE | Freq: Three times a day (TID) | ORAL | 0 refills | Status: DC

## 2019-07-04 ENCOUNTER — Ambulatory Visit
Admission: RE | Admit: 2019-07-04 | Discharge: 2019-07-04 | Disposition: A | Discharge status: Home / Self Care | Payer: Medicare Other | Source: Ambulatory Visit | Attending: Family Medicine | Admitting: Family Medicine

## 2019-07-04 ENCOUNTER — Other Ambulatory Visit: Payer: Self-pay

## 2019-07-04 ENCOUNTER — Other Ambulatory Visit: Payer: Self-pay | Admitting: Family Medicine

## 2019-07-04 DIAGNOSIS — N6452 Nipple discharge: Secondary | ICD-10-CM

## 2019-07-04 DIAGNOSIS — N6489 Other specified disorders of breast: Secondary | ICD-10-CM

## 2019-07-04 DIAGNOSIS — N6320 Unspecified lump in the left breast, unspecified quadrant: Secondary | ICD-10-CM

## 2019-07-05 ENCOUNTER — Ambulatory Visit
Admission: RE | Admit: 2019-07-05 | Discharge: 2019-07-05 | Disposition: A | Discharge status: Home / Self Care | Payer: Medicare Other | Source: Ambulatory Visit | Attending: Family Medicine | Admitting: Family Medicine

## 2019-07-05 ENCOUNTER — Other Ambulatory Visit: Payer: Self-pay

## 2019-07-05 ENCOUNTER — Other Ambulatory Visit: Payer: Self-pay | Admitting: Diagnostic Radiology

## 2019-07-05 DIAGNOSIS — N6320 Unspecified lump in the left breast, unspecified quadrant: Secondary | ICD-10-CM

## 2019-07-05 DIAGNOSIS — N6489 Other specified disorders of breast: Secondary | ICD-10-CM

## 2019-07-05 HISTORY — PX: BREAST BIOPSY: SHX20

## 2019-07-11 ENCOUNTER — Ambulatory Visit: Payer: Medicare Other

## 2019-07-13 ENCOUNTER — Other Ambulatory Visit: Payer: Self-pay | Admitting: Family Medicine

## 2019-07-14 ENCOUNTER — Other Ambulatory Visit: Payer: Self-pay | Admitting: Surgery

## 2019-07-27 ENCOUNTER — Encounter: Payer: Self-pay | Admitting: Family Medicine

## 2019-08-07 ENCOUNTER — Other Ambulatory Visit: Payer: Self-pay | Admitting: Surgery

## 2019-08-07 DIAGNOSIS — N6452 Nipple discharge: Secondary | ICD-10-CM

## 2019-08-11 ENCOUNTER — Ambulatory Visit
Admission: RE | Admit: 2019-08-11 | Discharge: 2019-08-11 | Disposition: A | Discharge status: Home / Self Care | Payer: Medicare Other | Source: Ambulatory Visit | Attending: Surgery | Admitting: Surgery

## 2019-08-11 ENCOUNTER — Other Ambulatory Visit: Payer: Self-pay

## 2019-08-11 DIAGNOSIS — N6452 Nipple discharge: Secondary | ICD-10-CM

## 2019-08-11 MED ORDER — GADOBUTROL 1 MMOL/ML IV SOLN
10.0000 mL | Freq: Once | INTRAVENOUS | Status: AC | PRN
  Administered 2019-08-11: 10 mL via INTRAVENOUS

## 2019-08-15 ENCOUNTER — Other Ambulatory Visit: Payer: Self-pay | Admitting: Surgery

## 2019-08-15 ENCOUNTER — Telehealth: Payer: Self-pay | Admitting: Family Medicine

## 2019-08-15 DIAGNOSIS — R9389 Abnormal findings on diagnostic imaging of other specified body structures: Secondary | ICD-10-CM

## 2019-08-15 NOTE — Telephone Encounter (Signed)
Pt had left message on triage line about MyChart. I called to discuss this with her and get her signed up. Lvm asking her to call office.

## 2019-08-24 ENCOUNTER — Ambulatory Visit
Admission: RE | Admit: 2019-08-24 | Discharge: 2019-08-24 | Disposition: A | Discharge status: Home / Self Care | Payer: Medicare Other | Source: Ambulatory Visit | Attending: Surgery | Admitting: Surgery

## 2019-08-24 ENCOUNTER — Other Ambulatory Visit: Payer: Self-pay | Admitting: Body Imaging

## 2019-08-24 ENCOUNTER — Other Ambulatory Visit: Payer: Self-pay

## 2019-08-24 DIAGNOSIS — R9389 Abnormal findings on diagnostic imaging of other specified body structures: Secondary | ICD-10-CM

## 2019-08-24 HISTORY — PX: BREAST BIOPSY: SHX20

## 2019-08-24 MED ORDER — GADOBUTROL 1 MMOL/ML IV SOLN
10.0000 mL | Freq: Once | INTRAVENOUS | Status: AC | PRN
  Administered 2019-08-24: 07:00:00 10 mL via INTRAVENOUS

## 2019-08-25 ENCOUNTER — Encounter: Payer: Self-pay | Admitting: Genetic Counselor

## 2019-09-05 ENCOUNTER — Ambulatory Visit: Payer: Self-pay | Admitting: Surgery

## 2019-09-05 DIAGNOSIS — D0511 Intraductal carcinoma in situ of right breast: Secondary | ICD-10-CM

## 2019-09-05 DIAGNOSIS — N6489 Other specified disorders of breast: Secondary | ICD-10-CM

## 2019-09-05 NOTE — H&P (View-Only) (Signed)
[name] Documented: 09/05/2019 3:39 PM Location: [facility] Patient #: [id number] DOB: 11/04/1940 Married / Language: Cleophus Molt / Race: White Female  History of Present Illness [name] A. Requan Hardge MD; 09/05/2019 4:18 PM) Patient words: Patient returns for follow-up after breast magnetic resonance imaging. She was seen in October 2020 due to right breast bloody nipple discharge. She had a mass at the tip of the right nipple. Punch biopsy showed adenoma. She went on to have her magnetic resonance imaging and an area roughly 1 cm upper central right breast with calcifications. Core biopsy showed high-grade DCIS. She had a separate area in the left order some distortion measuring 3.5 cm. Biopsy revealed a radial scar. She is here for follow-up with her daughter today.    ADDITIONAL INFORMATION: 2. PROGNOSTIC INDICATORS Results: IMMUNOHISTOCHEMICAL AND MORPHOMETRIC ANALYSIS PERFORMED MANUALLY Estrogen Receptor: 100%, POSITIVE, STRONG STAINING INTENSITY Progesterone Receptor: 70%, POSITIVE, STRONG STAINING INTENSITY REFERENCE RANGE ESTROGEN RECEPTOR NEGATIVE 0% POSITIVE =>1% REFERENCE RANGE PROGESTERONE RECEPTOR NEGATIVE 0% POSITIVE =>1% All controls stained appropriately Thressa Sheller MD Pathologist, Electronic Signature ( Signed 08/29/2019) FINAL DIAGNOSIS Diagnosis 1. Breast, left, needle core biopsy, central lateral - COMPLEX SCLEROSING LESION. - FIBROCYSTIC CHANGE AND USUAL DUCTAL TYPE LESION. - NO MALIGNANCY IDENTIFIED. 2. 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 - BACKGROUND FEATURES SUGGESTIVE OF HAMARTOMATOUS LESION. 1 of 2 FINAL for [name], [name] ([id number]) Microscopic Comment 2. The carcinoma appears high-grade grade. Prognostic makers will be ordered. Dr. Melina Copa has reviewed the case. The case was called to [facility] on 08/25/2019. [name]  MD Pathologist, Electronic Signature (Case signed 08/25/2019) Specimen Gross and Clinical Information Specimen Comment 1. TIF: 8:48 AM, extracted 2 min; non mass enhancement; abnormalities found on MRI to evaluate right nipple ulceration and bleeding 2. TIF: 8:51 AM, extracted 2 min; mass Specimen(s) Obtained: 1. Breast, left, needle core biopsy, central lateral 2. Breast, right, needle core biopsy, superior central Specimen Clinical Information 1. FCC, benign breast tissue R/O malignancy 2. Fibroadenoma, FCC, papilloma R/O malignancy Gross 1. Received in formalin (TIF 8:40 a.m., CIT 2 minutes) labeled with the patient's name and "left breast central lateral" are several fragmented cores of tan-yellow soft tissue ranging from 1 to 3.2 cm, 3 x 2.5 x 0.6 cm in aggregate. Submitted in two cassettes. 2. Received in formalin (TIF 8:51 a.m., CIT 2 minutes) labeled with the patient's name and "right breast superior central" are several fragmented cores of tan-yellow soft tissue ranging from 1.2 to 3 cm, submitted in two cassettes. (AK:ah 08/24/19) Stain(s) used in Diagnosis: The following stain(s) were used in diagnosing the case: PR-ACIS, ER-ACIS. The control(s) stained appropriately. Disclaimer PR progesterone receptor (16), immunohistochemical stains are performed on formalin fixed, paraffin embedded tissue using a 3,3"-diaminobenzidine (DAB) chromogen and Leica Bond Autostainer System. The staining intensity of the nucleus is scored manually and is reported as the percentage of tumor cell nuclei demonstrating specific nuclear staining.Specimens are fixed in 10% Neutral Buffered Formalin for at least 6 hours and up to 72 hours. These tests have not be validated on decalcified tissue. Results should be interpreted with caution given the possibility of false negative results on decalcified specimens. Estrogen receptor (6F11), immunohistochemical stains are performed on formalin fixed, paraffin  embedded tissue using a 3,3"-diaminobenzidine (DAB) chromogen and Leica Bond Autostainer System. The staining intensity of the nucleus is scored manually and is reported as the percentage of tumor cell nuclei demonstrating specific nuclear staining.Specimens  are fixed in 10% Neutral Buffered Formalin for at least 6 hours and up to 72 hours. These tests have not be validated on decalcified tissue. Results should be interpreted with caution given the possibility of false negative results on decalcified specimens. Report signed out from the following location(s) Technical Component was performed at [facility]. [address],[place] [address].[id number]., Interpretation was performed at [facility], [place], [place] [address]. CLIA #: [id number], 2 of.  The patient is a 78 year old female.   Problem List/Past Medical [name], CMA; 09/05/2019 3:40 PM) BLOODY DISCHARGE FROM RIGHT NIPPLE (N64.52) BREAST MASS, RIGHT (N63.10) LEFT BREAST MASS (N63.20)  Past Surgical History [name], CMA; 09/05/2019 3:40 PM) Cataract Surgery Bilateral. Knee Surgery Right. Shoulder Surgery Right.  Diagnostic Studies History Sharyn Lull R. [name], CMA; 09/05/2019 3:40 PM) Colonoscopy 5-10 years ago Mammogram within last year  Allergies [name], CMA; 09/05/2019 3:40 PM) Codeine and Related  Medication History [name], CMA; 09/05/2019 3:40 PM) Atenolol/Chlorthalidone (50-25MG  Tablet, Oral) Active. Medications Reconciled  Social History [name], CMA; 09/05/2019 3:40 PM) Caffeine use Tea. No alcohol use No drug use  Family History (Michelle R. [name], CMA; 09/05/2019 3:40 PM) Heart disease in female family member before age 27 Hypertension Father, Mother.  Pregnancy / Birth History [name], CMA; 09/05/2019 3:40 PM) [name] Age 5 Length (months) of  breastfeeding 3-6 Maternal age 60-20 Para 6  Other Problems [name], CMA; 09/05/2019 3:40 PM) Arthritis Back Pain High blood pressure Hypercholesterolemia    Vitals (Michelle R. Brooks CMA; 09/05/2019 3:40 PM) 09/05/2019 3:39 PM Weight: 217.5 lb Height: 63in Body Surface Area: 2 m Body Mass Index: 38.53 kg/m  Pulse: 90 (Regular)  BP: 142/86 (Sitting, Left Arm, Standard)        Physical Exam (Kaytee Taliercio A. Lizza Huffaker MD; 09/05/2019 4:19 PM)  General Mental Status-Alert. General Appearance-Consistent with stated age. Hydration-Well hydrated. Voice-Normal.  Head and Neck Head-normocephalic, atraumatic with no lesions or palpable masses. Trachea-midline. Thyroid Gland Characteristics - normal size and consistency.  Breast Note: Mass measuring 1 cm at the tip of the right nipple noted. No nipple discharge today on the right. No other masses in the right noted. Left breast reveals no mass lesion. Postbiopsy scar sites noted.  Lymphatic Axillary -Note:No adenopathy noted bilaterally.     Assessment & Plan (Icelynn Onken A. 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 Risk of lumpectomy include bleeding, infection, seroma, more surgery, use of seed/wire, wound care, cosmetic deformity and the need for other treatments, death , blood clots, death. Pt agrees to proceed.   BREAST NEOPLASM, TIS (DCIS), RIGHT (D05.11) Impression: Recommend right breast she localized lumpectomy for high-grade DCIS. Risk of lumpectomy include bleeding, infection, seroma, more surgery, use of seed/wire, wound care, cosmetic deformity and the need for other treatments, death , blood clots, death. Pt agrees to proceed.  Current Plans You are being scheduled for surgery- Our schedulers will call you.  You should hear from our office's  scheduling department within 5 working days about the location, date, and time of surgery. We try to make accommodations for patient's preferences in scheduling surgery, but sometimes the OR schedule or the surgeon's schedule prevents [place] from making those accommodations.  If you have not heard from our office [phone]) in 5 working days, call the office  and ask for your surgeon's nurse.  If you have other questions about your diagnosis, plan, or surgery, call the office and ask for your surgeon's nurse.  Pt Education - [facility] Breast Cancer Information Given - Alight "Breast Journey" Package We discussed the staging and pathophysiology of breast cancer. We discussed all of the different options for treatment for breast cancer including surgery, chemotherapy, radiation therapy, Herceptin, and antiestrogen therapy. We discussed a sentinel lymph node biopsy as she does not appear to having lymph node involvement right now. We discussed the performance of that with injection of radioactive tracer and blue dye. We discussed that she would have an incision underneath her axillary hairline. We discussed that there is a bout a 10-20% chance of having a positive node with a sentinel lymph node biopsy and we will await the permanent pathology to make any other first further decisions in terms of her treatment. One of these options might be to return to the operating room to perform an axillary lymph node dissection. We discussed about a 1-2% risk lifetime of chronic shoulder pain as well as lymphedema associated with a sentinel lymph node biopsy. We discussed the options for treatment of the breast cancer which included lumpectomy versus a mastectomy. We discussed the performance of the lumpectomy with a wire placement. We discussed a 10-20% chance of a positive margin requiring reexcision in the operating room. We also discussed that she may need radiation therapy or antiestrogen therapy or both if she undergoes  lumpectomy. We discussed the mastectomy and the postoperative care for that as well. We discussed that there is no difference in her survival whether she undergoes lumpectomy with radiation therapy or antiestrogen therapy versus a mastectomy. There is a slight difference in the local recurrence rate being 3-5% with lumpectomy and about 1% with a mastectomy. We discussed the risks of operation including bleeding, infection, possible reoperation. She understands her further therapy will be based on what her stages at the time of her operation.  Pt Education - flb breast cancer surgery: discussed with patient and provided information. Pt Education - [facility] Breast Biopsy HCI: discussed with patient and provided information. Pt Education - ABC (After Breast Cancer) Class Info: discussed with patient and provided information.  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 Discussed potential for Paget's disease as well. Risk of lumpectomy include bleeding, infection, seroma, more surgery, use of seed/wire, wound care, cosmetic deformity and the need for other treatments, death , blood clots, death. Pt agrees to proceed.

## 2019-09-05 NOTE — H&P (Signed)
[name] Documented: 09/05/2019 3:39 PM Location: [facility] Patient #: [id number] DOB: 06/10/1941 Married / Language: [name] / Race: White Female  History of Present Illness [name] A. [name] MD; 09/05/2019 4:18 PM) Patient words: Patient returns for follow-up after breast magnetic resonance imaging. She was seen in October 2020 due to right breast bloody nipple discharge. She had a mass at the tip of the right nipple. Punch biopsy showed adenoma. She went on to have her magnetic resonance imaging and an area roughly 1 cm upper central right breast with calcifications. Core biopsy showed high-grade DCIS. She had a separate area in the left order some distortion measuring 3.5 cm. Biopsy revealed a radial scar. She is here for follow-up with her daughter today.    ADDITIONAL INFORMATION: 2. PROGNOSTIC INDICATORS Results: IMMUNOHISTOCHEMICAL AND MORPHOMETRIC ANALYSIS PERFORMED MANUALLY Estrogen Receptor: 100%, POSITIVE, STRONG STAINING INTENSITY Progesterone Receptor: 70%, POSITIVE, STRONG STAINING INTENSITY REFERENCE RANGE ESTROGEN RECEPTOR NEGATIVE 0% POSITIVE =>1% REFERENCE RANGE PROGESTERONE RECEPTOR NEGATIVE 0% POSITIVE =>1% All controls stained appropriately Thressa Sheller MD Pathologist, Electronic Signature ( Signed 08/29/2019) FINAL DIAGNOSIS Diagnosis 1. Breast, left, needle core biopsy, central lateral - COMPLEX SCLEROSING LESION. - FIBROCYSTIC CHANGE AND USUAL DUCTAL TYPE LESION. - NO MALIGNANCY IDENTIFIED. 2. 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 - BACKGROUND FEATURES SUGGESTIVE OF HAMARTOMATOUS LESION. 1 of 2 FINAL for Rachel, [name] ([id number]) Microscopic Comment 2. The carcinoma appears high-grade grade. Prognostic makers will be ordered. Dr. Melina Copa has reviewed the case. The case was called to [facility] on 08/25/2019. [name]  MD Pathologist, Electronic Signature (Case signed 08/25/2019) Specimen Gross and Clinical Information Specimen Comment 1. TIF: 8:48 AM, extracted 2 min; non mass enhancement; abnormalities found on MRI to evaluate right nipple ulceration and bleeding 2. TIF: 8:51 AM, extracted 2 min; mass Specimen(s) Obtained: 1. Breast, left, needle core biopsy, central lateral 2. Breast, right, needle core biopsy, superior central Specimen Clinical Information 1. FCC, benign breast tissue R/O malignancy 2. Fibroadenoma, FCC, papilloma R/O malignancy Gross 1. Received in formalin (TIF 8:40 a.m., CIT 2 minutes) labeled with the patient's name and "left breast central lateral" are several fragmented cores of tan-yellow soft tissue ranging from 1 to 3.2 cm, 3 x 2.5 x 0.6 cm in aggregate. Submitted in two cassettes. 2. Received in formalin (TIF 8:51 a.m., CIT 2 minutes) labeled with the patient's name and "right breast superior central" are several fragmented cores of tan-yellow soft tissue ranging from 1.2 to 3 cm, submitted in two cassettes. (AK:ah 08/24/19) Stain(s) used in Diagnosis: The following stain(s) were used in diagnosing the case: PR-ACIS, ER-ACIS. The control(s) stained appropriately. Disclaimer PR progesterone receptor (16), immunohistochemical stains are performed on formalin fixed, paraffin embedded tissue using a 3,3"-diaminobenzidine (DAB) chromogen and Leica Bond Autostainer System. The staining intensity of the nucleus is scored manually and is reported as the percentage of tumor cell nuclei demonstrating specific nuclear staining.Specimens are fixed in 10% Neutral Buffered Formalin for at least 6 hours and up to 72 hours. These tests have not be validated on decalcified tissue. Results should be interpreted with caution given the possibility of false negative results on decalcified specimens. Estrogen receptor (6F11), immunohistochemical stains are performed on formalin fixed, paraffin  embedded tissue using a 3,3"-diaminobenzidine (DAB) chromogen and Leica Bond Autostainer System. The staining intensity of the nucleus is scored manually and is reported as the percentage of tumor cell nuclei demonstrating specific nuclear staining.Specimens  are fixed in 10% Neutral Buffered Formalin for at least 6 hours and up to 72 hours. These tests have not be validated on decalcified tissue. Results should be interpreted with caution given the possibility of false negative results on decalcified specimens. Report signed out from the following location(s) Technical Component was performed at [facility]. [address],[place] [address].[id number]., Interpretation was performed at [facility] [address], [place], [place] [address]. CLIA #: [id number], 2 of.  The patient is a 78 year old female.   Problem List/Past Medical [name], CMA; 09/05/2019 3:40 PM) BLOODY DISCHARGE FROM RIGHT NIPPLE (N64.52) BREAST MASS, RIGHT (N63.10) LEFT BREAST MASS (N63.20)  Past Surgical History [name], CMA; 09/05/2019 3:40 PM) Cataract Surgery Bilateral. Knee Surgery Right. Shoulder Surgery Right.  Diagnostic Studies History Rachel Lull R. [name], CMA; 09/05/2019 3:40 PM) Colonoscopy 5-10 years ago Mammogram within last year  Allergies [name], CMA; 09/05/2019 3:40 PM) Codeine and Related  Medication History [name], CMA; 09/05/2019 3:40 PM) Atenolol/Chlorthalidone (50-25MG  Tablet, Oral) Active. Medications Reconciled  Social History [name], CMA; 09/05/2019 3:40 PM) Caffeine use Tea. No alcohol use No drug use  Family History (Michelle R. [name], CMA; 09/05/2019 3:40 PM) Heart disease in female family member before age 2 Hypertension Father, Mother.  Pregnancy / Birth History [name], CMA; 09/05/2019 3:40 PM) [name] Age 5 Length (months) of  breastfeeding 3-6 Maternal age 29-20 Para 6  Other Problems [name], CMA; 09/05/2019 3:40 PM) Arthritis Back Pain High blood pressure Hypercholesterolemia    Vitals (Michelle R. [name] CMA; 09/05/2019 3:40 PM) 09/05/2019 3:39 PM Weight: 217.5 lb Height: 63in Body Surface Area: 2 m Body Mass Index: 38.53 kg/m  Pulse: 90 (Regular)  BP: 142/86 (Sitting, Left Arm, Standard)        Physical Exam (Medora Roorda A. Olamae Ferrara MD; 09/05/2019 4:19 PM)  General Mental Status-Alert. General Appearance-Consistent with stated age. Hydration-Well hydrated. Voice-Normal.  Head and Neck Head-normocephalic, atraumatic with no lesions or palpable masses. Trachea-midline. Thyroid Gland Characteristics - normal size and consistency.  Breast Note: Mass measuring 1 cm at the tip of the right nipple noted. No nipple discharge today on the right. No other masses in the right noted. Left breast reveals no mass lesion. Postbiopsy scar sites noted.  Lymphatic Axillary -Note:No adenopathy noted bilaterally.     Assessment & Plan (Kaiesha Tonner A. 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 Risk of lumpectomy include bleeding, infection, seroma, more surgery, use of seed/wire, wound care, cosmetic deformity and the need for other treatments, death , blood clots, death. Pt agrees to proceed.   BREAST NEOPLASM, TIS (DCIS), RIGHT (D05.11) Impression: Recommend right breast she localized lumpectomy for high-grade DCIS. Risk of lumpectomy include bleeding, infection, seroma, more surgery, use of seed/wire, wound care, cosmetic deformity and the need for other treatments, death , blood clots, death. Pt agrees to proceed.  Current Plans You are being scheduled for surgery- Our schedulers will call you.  You should hear from our office's  scheduling department within 5 working days about the location, date, and time of surgery. We try to make accommodations for patient's preferences in scheduling surgery, but sometimes the OR schedule or the surgeon's schedule prevents [place] from making those accommodations.  If you have not heard from our office [phone]) in 5 working days, call the office  and ask for your surgeon's nurse.  If you have other questions about your diagnosis, plan, or surgery, call the office and ask for your surgeon's nurse.  Pt Education - [facility] Breast Cancer Information Given - Alight "Breast Journey" Package We discussed the staging and pathophysiology of breast cancer. We discussed all of the different options for treatment for breast cancer including surgery, chemotherapy, radiation therapy, Herceptin, and antiestrogen therapy. We discussed a sentinel lymph node biopsy as she does not appear to having lymph node involvement right now. We discussed the performance of that with injection of radioactive tracer and blue dye. We discussed that she would have an incision underneath her axillary hairline. We discussed that there is a bout a 10-20% chance of having a positive node with a sentinel lymph node biopsy and we will await the permanent pathology to make any other first further decisions in terms of her treatment. One of these options might be to return to the operating room to perform an axillary lymph node dissection. We discussed about a 1-2% risk lifetime of chronic shoulder pain as well as lymphedema associated with a sentinel lymph node biopsy. We discussed the options for treatment of the breast cancer which included lumpectomy versus a mastectomy. We discussed the performance of the lumpectomy with a wire placement. We discussed a 10-20% chance of a positive margin requiring reexcision in the operating room. We also discussed that she may need radiation therapy or antiestrogen therapy or both if she undergoes  lumpectomy. We discussed the mastectomy and the postoperative care for that as well. We discussed that there is no difference in her survival whether she undergoes lumpectomy with radiation therapy or antiestrogen therapy versus a mastectomy. There is a slight difference in the local recurrence rate being 3-5% with lumpectomy and about 1% with a mastectomy. We discussed the risks of operation including bleeding, infection, possible reoperation. She understands her further therapy will be based on what her stages at the time of her operation.  Pt Education - flb breast cancer surgery: discussed with patient and provided information. Pt Education - [facility] Breast Biopsy HCI: discussed with patient and provided information. Pt Education - ABC (After Breast Cancer) Class Info: discussed with patient and provided information.  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 Discussed potential for Paget's disease as well. Risk of lumpectomy include bleeding, infection, seroma, more surgery, use of seed/wire, wound care, cosmetic deformity and the need for other treatments, death , blood clots, death. Pt agrees to proceed.

## 2019-09-06 ENCOUNTER — Telehealth: Payer: Self-pay | Admitting: Radiation Oncology

## 2019-09-06 NOTE — Telephone Encounter (Signed)
New Message: ° ° °LVM for patient to return call to schedule appt from referral received. °

## 2019-09-07 ENCOUNTER — Telehealth: Payer: Self-pay | Admitting: Oncology

## 2019-09-07 ENCOUNTER — Ambulatory Visit (INDEPENDENT_AMBULATORY_CARE_PROVIDER_SITE_OTHER): Payer: Medicare Other | Admitting: Family Medicine

## 2019-09-07 ENCOUNTER — Encounter: Payer: Self-pay | Admitting: Oncology

## 2019-09-07 ENCOUNTER — Other Ambulatory Visit: Payer: Self-pay

## 2019-09-07 ENCOUNTER — Encounter: Payer: Self-pay | Admitting: Family Medicine

## 2019-09-07 ENCOUNTER — Other Ambulatory Visit: Payer: Self-pay | Admitting: Surgery

## 2019-09-07 VITALS — BP 148/62 | HR 90 | Temp 97.5°F | Ht 63.0 in | Wt 217.5 lb

## 2019-09-07 DIAGNOSIS — D0511 Intraductal carcinoma in situ of right breast: Secondary | ICD-10-CM | POA: Diagnosis not present

## 2019-09-07 DIAGNOSIS — N6489 Other specified disorders of breast: Secondary | ICD-10-CM

## 2019-09-07 DIAGNOSIS — R350 Frequency of micturition: Secondary | ICD-10-CM | POA: Diagnosis not present

## 2019-09-07 LAB — POC URINALSYSI DIPSTICK (AUTOMATED)
Bilirubin, UA: NEGATIVE
Blood, UA: NEGATIVE
Glucose, UA: NEGATIVE
Ketones, UA: NEGATIVE
Leukocytes, UA: NEGATIVE
Nitrite, UA: NEGATIVE
Protein, UA: NEGATIVE
Spec Grav, UA: 1.025 (ref 1.010–1.025)
Urobilinogen, UA: 0.2 E.U./dL
pH, UA: 6 (ref 5.0–8.0)

## 2019-09-07 NOTE — Telephone Encounter (Signed)
Received a new patient referral from Dr. [name] for DCIS. [name] has been scheduled to see Dr. Jana Hakim on 12/30 at 4pm w/labs at 3:30pm. Letter mailed.

## 2019-09-07 NOTE — Progress Notes (Signed)
Chief Complaint  Patient presents with  . 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  She is scheduled for lumpectomy.  We discussed her lack on mammograms in past years proceeding this diagnosis.Marland Kitchen given her average risk and desire to avoid mammogram... we stopped them at age 40. Prior to that she had been managed at GYN. Per record last mammogram 2011.   She  presents with recent frequent urination...  Per pt may also be HCTZ causing it. ( She has been on this med for a while without similar symptoms) She has noted it in last month, some bladder odor.  No dysuria, no fever, no abdominal pain.  Occ soreness at vaginal area  No dysuria.   Last UTI 2 month EColi, pansenstitive  BP Readings from Last 3 Encounters:  09/07/19 (!) 148/62  06/27/19 120/60  12/15/18 130/60    This visit occurred during the SARS-CoV-2 public health emergency.  Safety protocols were in place, including screening questions prior to the visit, additional usage of staff PPE, and extensive cleaning of exam room while observing appropriate contact time as indicated for disinfecting solutions.   COVID 19 screen:  No recent travel or known exposure to COVID19 The patient denies respiratory symptoms of COVID 19 at this time. The importance of social distancing was discussed today.     Review of Systems  Constitutional: Negative for chills and fever.  HENT: Negative for congestion and ear pain.   Eyes: Negative for pain and redness.  Respiratory: Negative for cough and shortness of breath.   Cardiovascular: Negative for chest pain, palpitations and leg swelling.  Gastrointestinal: Negative for abdominal pain, blood in stool, constipation, diarrhea, nausea and vomiting.  Genitourinary: Negative for dysuria.  Musculoskeletal: Negative for falls and myalgias.  Skin: Negative for  rash.  Neurological: Negative for dizziness.  Psychiatric/Behavioral: Negative for depression. The patient is not nervous/anxious.       Past Medical History:  Diagnosis Date  . Cataract    diagnosed by Dr. Phineas Douglas  . Dysrhythmia    PSVT/ palpitations-  controlled with prn Inderal  . GERD (gastroesophageal reflux disease)   . Hyperlipidemia    statin intolerant  . Hypertension    eccho 4/13 EPIC  . Migraines   . Obesity   . Osteoarthritis   . Phlebitis of leg, right, superficial 1986/1989   x 2    reports that she quit smoking about 41 years ago. She has a 5.00 pack-year smoking history. She has never used smokeless tobacco. She reports that she does not drink alcohol or use drugs.   Current Outpatient Medications:  .  FOLIC ACID PO, Take 20 mg by mouth daily. , Disp: , Rfl:  .  hydrochlorothiazide (HYDRODIURIL) 25 MG tablet, Take 1 tablet by mouth once daily, Disp: 90 tablet, Rfl: 1 .  mometasone (ELOCON) 0.1 % ointment, Apply topically 2 (two) times daily., Disp: 45 g, Rfl: 1   Observations/Objective: Blood pressure (!) 148/62, pulse 90, temperature (!) 97.5 F (36.4 C), temperature source Temporal, height 5\' 3"  (1.6 m), weight 217 lb 8 oz (98.7 kg), SpO2 96 %.  Physical Exam Constitutional:      General: She is not in acute distress.    Appearance: Normal appearance. She is well-developed. She is not ill-appearing or toxic-appearing.  HENT:     Head: Normocephalic.  Right Ear: Hearing, tympanic membrane, ear canal and external ear normal. Tympanic membrane is not erythematous, retracted or bulging.     Left Ear: Hearing, tympanic membrane, ear canal and external ear normal. Tympanic membrane is not erythematous, retracted or bulging.     Nose: No mucosal edema or rhinorrhea.     Right Sinus: No maxillary sinus tenderness or frontal sinus tenderness.     Left Sinus: No maxillary sinus tenderness or frontal sinus tenderness.     Mouth/Throat:     Pharynx: Uvula  midline.  Eyes:     General: Lids are normal. Lids are everted, no foreign bodies appreciated.     Conjunctiva/sclera: Conjunctivae normal.     Pupils: Pupils are equal, round, and reactive to light.  Neck:     Musculoskeletal: Normal range of motion and neck supple.     Thyroid: No thyroid mass or thyromegaly.     Vascular: No carotid bruit.     Trachea: Trachea normal.  Cardiovascular:     Rate and Rhythm: Normal rate and regular rhythm.     Pulses: Normal pulses.     Heart sounds: Normal heart sounds, S1 normal and S2 normal. No murmur. No friction rub. No gallop.   Pulmonary:     Effort: Pulmonary effort is normal. No tachypnea or respiratory distress.     Breath sounds: Normal breath sounds. No decreased breath sounds, wheezing, rhonchi or rales.  Abdominal:     General: Bowel sounds are normal.     Palpations: Abdomen is soft.     Tenderness: There is no abdominal tenderness.  Skin:    General: Skin is warm and dry.     Findings: No rash.  Neurological:     Mental Status: She is alert.  Psychiatric:        Mood and Affect: Mood is not anxious or depressed.        Speech: Speech normal.        Behavior: Behavior normal. Behavior is cooperative.        Thought Content: Thought content normal.        Judgment: Judgment normal.      Assessment and Plan   Urinary frequency No clear UTI per UA.. send for culture given recurrent UTI in past .  Can try avoiding bladder irritants and decreased dose of HCTZ if negative culture.  Ductal carcinoma in situ (DCIS) of right breast Upcoming  lumpectomy planned per patient.  We discussed her treatment plan and expectations.     Eliezer Lofts, MD

## 2019-09-07 NOTE — Assessment & Plan Note (Signed)
Upcoming  lumpectomy planned per patient.  We discussed her treatment plan and expectations.

## 2019-09-07 NOTE — Assessment & Plan Note (Signed)
No clear UTI per UA.. send for culture given recurrent UTI in past .  Can try avoiding bladder irritants and decreased dose of HCTZ if negative culture.

## 2019-09-07 NOTE — Patient Instructions (Signed)
Call Dr. Kyla Balzarine office to request a pre-op evaluation.

## 2019-09-08 ENCOUNTER — Telehealth: Payer: Self-pay

## 2019-09-08 DIAGNOSIS — I10 Essential (primary) hypertension: Secondary | ICD-10-CM

## 2019-09-08 DIAGNOSIS — E785 Hyperlipidemia, unspecified: Secondary | ICD-10-CM

## 2019-09-08 DIAGNOSIS — I2510 Atherosclerotic heart disease of native coronary artery without angina pectoris: Secondary | ICD-10-CM

## 2019-09-08 LAB — URINE CULTURE
MICRO NUMBER:: 1160323
SPECIMEN QUALITY:: ADEQUATE

## 2019-09-08 NOTE — Progress Notes (Signed)
Cardiology Office Note   Date:  09/08/2019   ID:  Rachel, [name] 11-06-1940, MRN [id number]  PCP:  [name], MD  Cardiologist:   Jenkins Rouge, MD   No chief complaint on file.     History of Present Illness: [name] is a 78 y.o. female who presents for f/uregarding dyspnea and CAD. She needs clearance to have surgery with Dr [name] for newly diagnosed breast cancer    Seen by Dr Acie Fredrickson in 2013 for palpitations, HLD, HTN Chronically abnormal ECG with LVH and strain. TTE at that time was normal [facility] rheumatology Dr Amil Amen has been tried on multiple meds with side effects of weight gain and diarrhea currently on Enbrel   Seen By Dr Elsworth Soho June as CT chest suggested ILD. 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 Also with 4 mm RUL nodule  TTE from 03/10/18 And EF 92-01% grade on diastolic dysfunction mild MR/MAC normal estimated PA pressures. CT scan also showed calcification of the LM and LAD arteries   Lab review from 11/04/17 LDL 137  A1c 6.1   She has been diagnosed with breast cancer and needs lumpectomy with Dr [name] Surgery is schedule fro 12/16  Recent frequency but UA negative   Past Medical History:  Diagnosis Date  . Cataract    diagnosed by Dr. Phineas Douglas  . Dysrhythmia    PSVT/ palpitations-  controlled with prn Inderal  . GERD (gastroesophageal reflux disease)   . Hyperlipidemia    statin intolerant  . Hypertension    eccho 4/13 EPIC  . Migraines   . Obesity   . Osteoarthritis   . Phlebitis of leg, right, superficial 1986/1989   x 2    Past Surgical History:  Procedure Laterality Date  . CARDIAC CATHETERIZATION  1990s   negative  . CARDIOVASCULAR STRESS TEST  2008   NML  . CARDIOVASCULAR STRESS TEST  02/02/2007   EF 70%, NO ISCHEMIA  . DILATION AND CURETTAGE, DIAGNOSTIC / THERAPEUTIC  2008  . LESION DESTRUCTION  10/2017   Face; Dr. Ronnald Ramp  . ROTATOR CUFF REPAIR  1999   Right, left in 2002  . TOTAL KNEE  ARTHROPLASTY  2008   Left  . 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 US ECHOCARDIOGRAPHY  02/07/2007   EF 55-60%     Current Outpatient Medications  Medication Sig Dispense Refill  . FOLIC ACID PO Take 20 mg by mouth daily.     . hydrochlorothiazide (HYDRODIURIL) 25 MG tablet Take 1 tablet by mouth once daily 90 tablet 1  . mometasone (ELOCON) 0.1 % ointment Apply topically 2 (two) times daily. 45 g 1   No current facility-administered medications for this visit.     Allergies:   Codeine and Hydrocodone-homatropine    Social History:  The patient  reports that she quit smoking about 41 years ago. She has a 5.00 pack-year smoking history. She has never used smokeless tobacco. She reports that she does not drink alcohol or use drugs.   Family History:  The patient's family history includes Coronary artery disease in an other family member; Heart attack (age of onset: 27) in her father; Heart defect in her sister; Hypertension in her father and mother; Lung cancer in her brother.    ROS:  Please see the history of present illness.  Otherwise, review of systems are positive for none.   All other systems are reviewed and negative.    PHYSICAL EXAM: VS:  There were no vitals taken for this visit. , BMI There is no height or weight on file to calculate BMI. Affect appropriate Healthy:  appears stated age 62: normal Neck supple with no adenopathy JVP normal no bruits no thyromegaly Lungs clear with no wheezing and good diaphragmatic motion Heart:  S1/S2 no murmur, no rub, gallop or click PMI normal Abdomen: benighn, BS positve, no tenderness, no AAA no bruit.  No HSM or HJR Distal pulses intact with no bruits No edema Neuro non-focal Skin warm and dry No muscular weakness    EKG:  SR LVH with strain no change from 2013 09/11/19 SR rate 76 nonspecific  ST changes    Recent Labs: 02/09/2019: ALT 23; BUN 17; Creatinine, Ser 0.66; Hemoglobin 12.9; Platelets 198.0; Potassium 3.5; Pro B Natriuretic peptide (BNP) 39.0; Sodium 140; TSH 2.60    Lipid Panel    Component Value Date/Time   CHOL 210 (H) 11/08/2018 0810   TRIG 80.0 11/08/2018 0810   HDL 52.30 11/08/2018 0810   CHOLHDL 4 11/08/2018 0810   VLDL 16.0 11/08/2018 0810   LDLCALC 142 (H) 11/08/2018 0810   LDLDIRECT 151.5 08/17/2012 0901      Wt Readings from Last 3 Encounters:  09/07/19 217 lb 8 oz (98.7 kg)  06/27/19 218 lb 4 oz (99 kg)  02/09/19 218 lb (98.9 kg)      Other studies Reviewed: Additional studies/ records that were reviewed today include: Notes Dr Acie Fredrickson 2013 TTE Notes primary and pulmonary Dr Elsworth Soho CT chest and recent TTE June 2019.    ASSESSMENT AND PLAN:  1.  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 CAD:  Seen on CT part of UIP/ILD w/u outside chance exertional dyspnea may be anginal equivalent was supposed to have lexiscan myovue last year and not done will need to clear for surgery as well   3. RA:  Continue Enbrel f/u Dr Amil Amen   4. HLD discussed starting statin given CAD on CT and LDL of 137    5. DM:  Discussed low carb diet.  Target hemoglobin A1c is 6.5 or less.  Continue current medications.  6.  HTN  Well controlled.  Continue current medications and low sodium Dash type diet.    7. Abnormal ECG:  Chronic dating back to 2013 LVH with strain pattern although not seen on TTE ECG with non specific ST changes today    8. Preoperative: elderly female with abnormal ECG, CAD seen on CT scan and exertional dyspnea Needs lexiscan myovue to clear for surgery will try to get done this week   9. Frequency:  F/u Dr Diona Browner her UA done 09/07/19 is negative    Current medicines are reviewed at length with the patient today.  The patient  does not have concerns regarding medicines.  The following changes have been made:  None   Labs/ tests ordered today include: Lexiscan Myovue   No orders of the defined types were placed in this encounter.    Disposition:   FU with cardiology in 6 months if myovue normal     Signed, Jenkins Rouge, MD  09/08/2019 1:30 PM    [address] [place], [place]  [address] Phone: [phone]; Fax: [phone]

## 2019-09-08 NOTE — Telephone Encounter (Signed)
Patient called back and stated her PCP told her it would be a good idea to go ahead and see Dr. Johnsie Cancel in case Dr. [name] needs a surgical clearance from cardiology. Patient stated she has called Dr. Josetta Huddle office and waiting for a call back from them to see if she really needs a surgical clearance from Dr. Johnsie Cancel. Patient stated she does not want any stress test done. Patient may call and cancel appointment for Monday.

## 2019-09-08 NOTE — Telephone Encounter (Signed)
-----   Message from Josue Hector, MD sent at 09/08/2019  1:35 PM EST ----- This patient was supposed to have lexiscan myovue last year never done now needs it to clear for surgery lumpectomy already scheduled for 12/16  need to get it done Monday / Tuesday next week

## 2019-09-08 NOTE — Telephone Encounter (Signed)
Called patient about getting stress test ordered and scheduled. Patient stated she does not want to get stress test done. Patient stated she would have to call surgeon's office to see if she has to get test done. Informed patient that she would need to get stress test to be cleared at this time. Patient stated she would have to call our office back.

## 2019-09-11 ENCOUNTER — Ambulatory Visit (INDEPENDENT_AMBULATORY_CARE_PROVIDER_SITE_OTHER): Payer: Medicare Other | Admitting: Cardiovascular Disease

## 2019-09-11 ENCOUNTER — Other Ambulatory Visit: Payer: Self-pay

## 2019-09-11 ENCOUNTER — Encounter: Payer: Self-pay | Admitting: Cardiovascular Disease

## 2019-09-11 VITALS — BP 140/72 | HR 76 | Ht 63.0 in | Wt 216.0 lb

## 2019-09-11 DIAGNOSIS — Z01818 Encounter for other preprocedural examination: Secondary | ICD-10-CM | POA: Diagnosis not present

## 2019-09-11 DIAGNOSIS — E785 Hyperlipidemia, unspecified: Secondary | ICD-10-CM | POA: Diagnosis not present

## 2019-09-11 DIAGNOSIS — R0600 Dyspnea, unspecified: Secondary | ICD-10-CM

## 2019-09-11 DIAGNOSIS — I2510 Atherosclerotic heart disease of native coronary artery without angina pectoris: Secondary | ICD-10-CM

## 2019-09-11 NOTE — Patient Instructions (Addendum)
Medication Instructions:   *If you need a refill on your cardiac medications before your next appointment, please call your pharmacy*  Lab Work:  If you have labs (blood work) drawn today and your tests are completely normal, you will receive your results only by: Marland Kitchen MyChart Message (if you have MyChart) OR . A paper copy in the mail If you have any lab test that is abnormal or we need to change your treatment, we will call you to review the results.  Testing/Procedures: Your physician has requested that you have a lexiscan myoview as soon as possible. For further information please visit [email or web address]. Please follow instruction sheet, as given.  Follow-Up: At [facility], you and your health needs are our priority.  As part of our continuing mission to provide you with exceptional heart care, we have created designated Provider Care Teams.  These Care Teams include your primary Cardiologist (physician) and Advanced Practice Providers (APPs -  Physician Assistants and Nurse Practitioners) who all work together to provide you with the care you need, when you need it.  Your next appointment:   6 months - if test is normal  The format for your next appointment:   In Person  Provider:   You may see Jenkins Rouge, MD or one of the following Advanced Practice Providers on your designated Care Team:    Truitt Merle, NP  Cecilie Kicks, NP  Kathyrn Drown, NP

## 2019-09-12 ENCOUNTER — Telehealth (HOSPITAL_COMMUNITY): Payer: Self-pay | Admitting: *Deleted

## 2019-09-12 NOTE — Telephone Encounter (Signed)
Patient given detailed instructions per Myocardial Perfusion Study Information Sheet for the test on 09/15/19 at 7:45. Patient notified to arrive 15 minutes early and that it is imperative to arrive on time for appointment to keep from having the test rescheduled.  If you need to cancel or reschedule your appointment, please call the office within 24 hours of your appointment. . Patient verbalized understanding.[name]

## 2019-09-14 ENCOUNTER — Telehealth: Payer: Self-pay | Admitting: Hematology and Oncology

## 2019-09-14 NOTE — Telephone Encounter (Signed)
Mrs. Proenza cld to reschedule her appt to an earlier time bc she doesn't like driving in the dark. She has been rescheduled to see Dr. Lindi Adie on 12/31 at 1pm.

## 2019-09-15 ENCOUNTER — Other Ambulatory Visit: Payer: Self-pay

## 2019-09-15 ENCOUNTER — Ambulatory Visit (HOSPITAL_COMMUNITY): Payer: Medicare Other | Attending: Cardiology

## 2019-09-15 ENCOUNTER — Encounter (HOSPITAL_BASED_OUTPATIENT_CLINIC_OR_DEPARTMENT_OTHER): Payer: Self-pay | Admitting: Surgery

## 2019-09-15 DIAGNOSIS — E785 Hyperlipidemia, unspecified: Secondary | ICD-10-CM | POA: Insufficient documentation

## 2019-09-15 DIAGNOSIS — R0600 Dyspnea, unspecified: Secondary | ICD-10-CM | POA: Insufficient documentation

## 2019-09-15 DIAGNOSIS — I2510 Atherosclerotic heart disease of native coronary artery without angina pectoris: Secondary | ICD-10-CM | POA: Diagnosis present

## 2019-09-15 DIAGNOSIS — Z01818 Encounter for other preprocedural examination: Secondary | ICD-10-CM | POA: Diagnosis not present

## 2019-09-15 LAB — MYOCARDIAL PERFUSION IMAGING
LV dias vol: 76 mL (ref 46–106)
LV sys vol: 29 mL
Peak HR: 87 {beats}/min
Rest HR: 58 {beats}/min
SDS: 2
SRS: 0
SSS: 2
TID: 1.09

## 2019-09-15 MED ORDER — TECHNETIUM TC 99M TETROFOSMIN IV KIT
10.9000 | PACK | Freq: Once | INTRAVENOUS | Status: AC | PRN
  Administered 2019-09-15: 10.9 via INTRAVENOUS
  Filled 2019-09-15: qty 11

## 2019-09-15 MED ORDER — REGADENOSON 0.4 MG/5ML IV SOLN
0.4000 mg | Freq: Once | INTRAVENOUS | Status: AC
  Administered 2019-09-15: 0.4 mg via INTRAVENOUS

## 2019-09-15 MED ORDER — TECHNETIUM TC 99M TETROFOSMIN IV KIT
33.0000 | PACK | Freq: Once | INTRAVENOUS | Status: AC | PRN
  Administered 2019-09-15: 33 via INTRAVENOUS
  Filled 2019-09-15: qty 33

## 2019-09-16 ENCOUNTER — Other Ambulatory Visit (HOSPITAL_COMMUNITY)
Admission: RE | Admit: 2019-09-16 | Discharge: 2019-09-16 | Disposition: A | Discharge status: Home / Self Care | Payer: Medicare Other | Source: Ambulatory Visit | Attending: Surgery | Admitting: Surgery

## 2019-09-16 DIAGNOSIS — Z20828 Contact with and (suspected) exposure to other viral communicable diseases: Secondary | ICD-10-CM | POA: Diagnosis not present

## 2019-09-16 DIAGNOSIS — Z01812 Encounter for preprocedural laboratory examination: Secondary | ICD-10-CM | POA: Insufficient documentation

## 2019-09-16 LAB — SARS CORONAVIRUS 2 (TAT 6-24 HRS): SARS Coronavirus 2: NEGATIVE

## 2019-09-18 ENCOUNTER — Encounter (HOSPITAL_BASED_OUTPATIENT_CLINIC_OR_DEPARTMENT_OTHER)
Admission: RE | Admit: 2019-09-18 | Discharge: 2019-09-18 | Disposition: A | Discharge status: Home / Self Care | Payer: Medicare Other | Source: Ambulatory Visit | Attending: Surgery | Admitting: Surgery

## 2019-09-18 ENCOUNTER — Other Ambulatory Visit: Payer: Self-pay

## 2019-09-18 DIAGNOSIS — Z01812 Encounter for preprocedural laboratory examination: Secondary | ICD-10-CM | POA: Diagnosis not present

## 2019-09-18 LAB — CBC WITH DIFFERENTIAL/PLATELET
Abs Immature Granulocytes: 0 10*3/uL (ref 0.00–0.07)
Basophils Absolute: 0 10*3/uL (ref 0.0–0.1)
Basophils Relative: 0 %
Eosinophils Absolute: 0.4 10*3/uL (ref 0.0–0.5)
Eosinophils Relative: 4 %
HCT: 40.4 % (ref 36.0–46.0)
Hemoglobin: 13.3 g/dL (ref 12.0–15.0)
Lymphocytes Relative: 61 %
Lymphs Abs: 6.5 10*3/uL — ABNORMAL HIGH (ref 0.7–4.0)
MCH: 31.7 pg (ref 26.0–34.0)
MCHC: 32.9 g/dL (ref 30.0–36.0)
MCV: 96.2 fL (ref 80.0–100.0)
Monocytes Absolute: 0.6 10*3/uL (ref 0.1–1.0)
Monocytes Relative: 6 %
Neutro Abs: 3.1 10*3/uL (ref 1.7–7.7)
Neutrophils Relative %: 29 %
Platelets: 227 10*3/uL (ref 150–400)
RBC: 4.2 MIL/uL (ref 3.87–5.11)
RDW: 13.5 % (ref 11.5–15.5)
WBC: 10.6 10*3/uL — ABNORMAL HIGH (ref 4.0–10.5)
nRBC: 0 % (ref 0.0–0.2)
nRBC: 0 /100 WBC

## 2019-09-18 LAB — COMPREHENSIVE METABOLIC PANEL
ALT: 31 U/L (ref 0–44)
AST: 30 U/L (ref 15–41)
Albumin: 3.8 g/dL (ref 3.5–5.0)
Alkaline Phosphatase: 74 U/L (ref 38–126)
Anion gap: 10 (ref 5–15)
BUN: 11 mg/dL (ref 8–23)
CO2: 26 mmol/L (ref 22–32)
Calcium: 9 mg/dL (ref 8.9–10.3)
Chloride: 104 mmol/L (ref 98–111)
Creatinine, Ser: 0.66 mg/dL (ref 0.44–1.00)
GFR calc Af Amer: >60 mL/min (ref >60)
GFR calc non Af Amer: >60 mL/min (ref >60)
Glucose, Bld: 146 mg/dL — ABNORMAL HIGH (ref 70–99)
Potassium: 4.2 mmol/L (ref 3.5–5.1)
Sodium: 140 mmol/L (ref 135–145)
Total Bilirubin: 0.3 mg/dL (ref 0.3–1.2)
Total Protein: 7.1 g/dL (ref 6.5–8.1)

## 2019-09-18 NOTE — Progress Notes (Signed)

## 2019-09-19 ENCOUNTER — Other Ambulatory Visit: Payer: Self-pay

## 2019-09-19 ENCOUNTER — Ambulatory Visit
Admission: RE | Admit: 2019-09-19 | Discharge: 2019-09-19 | Disposition: A | Discharge status: Home / Self Care | Payer: Medicare Other | Source: Ambulatory Visit | Attending: Surgery | Admitting: Surgery

## 2019-09-19 DIAGNOSIS — N6489 Other specified disorders of breast: Secondary | ICD-10-CM

## 2019-09-19 DIAGNOSIS — D0511 Intraductal carcinoma in situ of right breast: Secondary | ICD-10-CM

## 2019-09-20 ENCOUNTER — Ambulatory Visit (HOSPITAL_BASED_OUTPATIENT_CLINIC_OR_DEPARTMENT_OTHER): Payer: Medicare Other | Admitting: Anesthesiology

## 2019-09-20 ENCOUNTER — Ambulatory Visit
Admission: RE | Admit: 2019-09-20 | Discharge: 2019-09-20 | Disposition: A | Discharge status: Home / Self Care | Payer: Medicare Other | Source: Ambulatory Visit | Attending: Surgery | Admitting: Surgery

## 2019-09-20 ENCOUNTER — Encounter (HOSPITAL_BASED_OUTPATIENT_CLINIC_OR_DEPARTMENT_OTHER)
Admission: RE | Disposition: A | Discharge status: Home / Self Care | Payer: Self-pay | Source: Home / Self Care | Attending: Surgery

## 2019-09-20 ENCOUNTER — Other Ambulatory Visit: Payer: Self-pay

## 2019-09-20 ENCOUNTER — Ambulatory Visit (HOSPITAL_BASED_OUTPATIENT_CLINIC_OR_DEPARTMENT_OTHER)
Admission: RE | Admit: 2019-09-20 | Discharge: 2019-09-20 | Disposition: A | Discharge status: Home / Self Care | Payer: Medicare Other | Attending: Surgery | Admitting: Surgery

## 2019-09-20 ENCOUNTER — Encounter (HOSPITAL_BASED_OUTPATIENT_CLINIC_OR_DEPARTMENT_OTHER): Payer: Self-pay | Admitting: Surgery

## 2019-09-20 DIAGNOSIS — Z6837 Body mass index (BMI) 37.0-37.9, adult: Secondary | ICD-10-CM | POA: Diagnosis not present

## 2019-09-20 DIAGNOSIS — Z79899 Other long term (current) drug therapy: Secondary | ICD-10-CM | POA: Insufficient documentation

## 2019-09-20 DIAGNOSIS — I10 Essential (primary) hypertension: Secondary | ICD-10-CM | POA: Insufficient documentation

## 2019-09-20 DIAGNOSIS — D241 Benign neoplasm of right breast: Secondary | ICD-10-CM | POA: Insufficient documentation

## 2019-09-20 DIAGNOSIS — M1990 Unspecified osteoarthritis, unspecified site: Secondary | ICD-10-CM | POA: Insufficient documentation

## 2019-09-20 DIAGNOSIS — D0511 Intraductal carcinoma in situ of right breast: Secondary | ICD-10-CM | POA: Insufficient documentation

## 2019-09-20 DIAGNOSIS — N6489 Other specified disorders of breast: Secondary | ICD-10-CM | POA: Insufficient documentation

## 2019-09-20 DIAGNOSIS — Z170 Estrogen receptor positive status [ER+]: Secondary | ICD-10-CM | POA: Diagnosis not present

## 2019-09-20 DIAGNOSIS — Z8249 Family history of ischemic heart disease and other diseases of the circulatory system: Secondary | ICD-10-CM | POA: Diagnosis not present

## 2019-09-20 DIAGNOSIS — Z885 Allergy status to narcotic agent status: Secondary | ICD-10-CM | POA: Diagnosis not present

## 2019-09-20 DIAGNOSIS — Z87891 Personal history of nicotine dependence: Secondary | ICD-10-CM | POA: Diagnosis not present

## 2019-09-20 HISTORY — PX: BREAST LUMPECTOMY: SHX2

## 2019-09-20 HISTORY — DX: Malignant (primary) neoplasm, unspecified: C80.1

## 2019-09-20 HISTORY — PX: BREAST EXCISIONAL BIOPSY: SUR124

## 2019-09-20 HISTORY — PX: BREAST LUMPECTOMY WITH RADIOACTIVE SEED LOCALIZATION: SHX6424

## 2019-09-20 SURGERY — BREAST LUMPECTOMY WITH RADIOACTIVE SEED LOCALIZATION
Anesthesia: General | Site: Breast | Laterality: Bilateral

## 2019-09-20 MED ORDER — EPHEDRINE SULFATE-NACL 50-0.9 MG/10ML-% IV SOSY
PREFILLED_SYRINGE | INTRAVENOUS | Status: DC | PRN
  Administered 2019-09-20: 10 mg via INTRAVENOUS

## 2019-09-20 MED ORDER — IBUPROFEN 800 MG PO TABS: 400.0000 mg | ORAL_TABLET | Freq: Three times a day (TID) | ORAL | 0 refills | Status: DC | PRN

## 2019-09-20 MED ORDER — ONDANSETRON HCL 4 MG/2ML IJ SOLN
INTRAMUSCULAR | Status: DC | PRN
  Administered 2019-09-20: 4 mg via INTRAVENOUS

## 2019-09-20 MED ORDER — OXYCODONE HCL 5 MG PO TABS
ORAL_TABLET | ORAL | Status: AC
  Filled 2019-09-20: qty 1

## 2019-09-20 MED ORDER — GLYCOPYRROLATE 0.2 MG/ML IJ SOLN
INTRAMUSCULAR | Status: DC | PRN
  Administered 2019-09-20: .2 mg via INTRAVENOUS

## 2019-09-20 MED ORDER — GABAPENTIN 300 MG PO CAPS
ORAL_CAPSULE | ORAL | Status: AC
  Filled 2019-09-20: qty 1

## 2019-09-20 MED ORDER — FENTANYL CITRATE (PF) 100 MCG/2ML IJ SOLN
INTRAMUSCULAR | Status: AC
  Filled 2019-09-20: qty 2

## 2019-09-20 MED ORDER — LIDOCAINE 2% (20 MG/ML) 5 ML SYRINGE
INTRAMUSCULAR | Status: AC
  Filled 2019-09-20: qty 5

## 2019-09-20 MED ORDER — FENTANYL CITRATE (PF) 100 MCG/2ML IJ SOLN
50.0000 ug | INTRAMUSCULAR | Status: DC | PRN
  Administered 2019-09-20 (×2): 50 ug via INTRAVENOUS

## 2019-09-20 MED ORDER — PROPOFOL 10 MG/ML IV BOLUS
INTRAVENOUS | Status: DC | PRN
  Administered 2019-09-20: 130 mg via INTRAVENOUS

## 2019-09-20 MED ORDER — CEFAZOLIN SODIUM-DEXTROSE 2-4 GM/100ML-% IV SOLN
INTRAVENOUS | Status: AC
  Filled 2019-09-20: qty 100

## 2019-09-20 MED ORDER — MIDAZOLAM HCL 2 MG/2ML IJ SOLN: 1.0000 mg | INTRAMUSCULAR | Status: DC | PRN

## 2019-09-20 MED ORDER — EPHEDRINE 5 MG/ML INJ
INTRAVENOUS | Status: AC
  Filled 2019-09-20: qty 10

## 2019-09-20 MED ORDER — PHENYLEPHRINE 40 MCG/ML (10ML) SYRINGE FOR IV PUSH (FOR BLOOD PRESSURE SUPPORT)
PREFILLED_SYRINGE | INTRAVENOUS | Status: DC | PRN
  Administered 2019-09-20: 80 ug via INTRAVENOUS

## 2019-09-20 MED ORDER — DEXTROSE 5 % IV SOLN
3.0000 g | INTRAVENOUS | Status: AC
  Administered 2019-09-20: 2 g via INTRAVENOUS

## 2019-09-20 MED ORDER — BUPIVACAINE HCL (PF) 0.25 % IJ SOLN
INTRAMUSCULAR | Status: DC | PRN
  Administered 2019-09-20: 30 mL

## 2019-09-20 MED ORDER — GABAPENTIN 300 MG PO CAPS
300.0000 mg | ORAL_CAPSULE | ORAL | Status: AC
  Administered 2019-09-20: 11:00:00 300 mg via ORAL

## 2019-09-20 MED ORDER — ACETAMINOPHEN 500 MG PO TABS
ORAL_TABLET | ORAL | Status: AC
  Filled 2019-09-20: qty 2

## 2019-09-20 MED ORDER — FENTANYL CITRATE (PF) 100 MCG/2ML IJ SOLN
25.0000 ug | INTRAMUSCULAR | Status: DC | PRN
  Administered 2019-09-20 (×3): 25 ug via INTRAVENOUS

## 2019-09-20 MED ORDER — PHENYLEPHRINE 40 MCG/ML (10ML) SYRINGE FOR IV PUSH (FOR BLOOD PRESSURE SUPPORT)
PREFILLED_SYRINGE | INTRAVENOUS | Status: AC
  Filled 2019-09-20: qty 10

## 2019-09-20 MED ORDER — LIDOCAINE 2% (20 MG/ML) 5 ML SYRINGE
INTRAMUSCULAR | Status: DC | PRN
  Administered 2019-09-20: 60 mg via INTRAVENOUS

## 2019-09-20 MED ORDER — OXYCODONE HCL 5 MG PO TABS
5.0000 mg | ORAL_TABLET | Freq: Once | ORAL | Status: AC
  Administered 2019-09-20: 15:00:00 5 mg via ORAL

## 2019-09-20 MED ORDER — DEXAMETHASONE SODIUM PHOSPHATE 4 MG/ML IJ SOLN
INTRAMUSCULAR | Status: DC | PRN
  Administered 2019-09-20: 5 mg via INTRAVENOUS

## 2019-09-20 MED ORDER — CEFAZOLIN SODIUM-DEXTROSE 1-4 GM/50ML-% IV SOLN
INTRAVENOUS | Status: AC
  Filled 2019-09-20: qty 50

## 2019-09-20 MED ORDER — ACETAMINOPHEN 500 MG PO TABS
1000.0000 mg | ORAL_TABLET | ORAL | Status: AC
  Administered 2019-09-20: 11:00:00 1000 mg via ORAL

## 2019-09-20 MED ORDER — LACTATED RINGERS IV SOLN: INTRAVENOUS | Status: DC

## 2019-09-20 MED ORDER — CHLORHEXIDINE GLUCONATE CLOTH 2 % EX PADS: 6.0000 | MEDICATED_PAD | Freq: Once | CUTANEOUS | Status: DC

## 2019-09-20 SURGICAL SUPPLY — 51 items
ADH SKN CLS APL DERMABOND .7 (GAUZE/BANDAGES/DRESSINGS) ×2
APL PRP STRL LF DISP 70% ISPRP (MISCELLANEOUS) ×2
APPLIER CLIP 9.375 MED OPEN (MISCELLANEOUS) ×3
APR CLP MED 9.3 20 MLT OPN (MISCELLANEOUS) ×1
BINDER BREAST LRG (GAUZE/BANDAGES/DRESSINGS) IMPLANT
BINDER BREAST MEDIUM (GAUZE/BANDAGES/DRESSINGS) IMPLANT
BINDER BREAST XLRG (GAUZE/BANDAGES/DRESSINGS) IMPLANT
BINDER BREAST XXLRG (GAUZE/BANDAGES/DRESSINGS) ×2 IMPLANT
BLADE SURG 15 STRL LF DISP TIS (BLADE) ×1 IMPLANT
BLADE SURG 15 STRL SS (BLADE) ×3
CANISTER SUC SOCK COL 7IN (MISCELLANEOUS) IMPLANT
CANISTER SUCT 1200ML W/VALVE (MISCELLANEOUS) IMPLANT
CHLORAPREP W/TINT 26 (MISCELLANEOUS) ×5 IMPLANT
CLIP APPLIE 9.375 MED OPEN (MISCELLANEOUS) IMPLANT
COVER BACK TABLE REUSABLE LG (DRAPES) ×3 IMPLANT
COVER MAYO STAND REUSABLE (DRAPES) ×3 IMPLANT
COVER PROBE W GEL 5X96 (DRAPES) ×3 IMPLANT
COVER WAND RF STERILE (DRAPES) IMPLANT
DECANTER SPIKE VIAL GLASS SM (MISCELLANEOUS) IMPLANT
DERMABOND ADVANCED (GAUZE/BANDAGES/DRESSINGS) ×4
DERMABOND ADVANCED .7 DNX12 (GAUZE/BANDAGES/DRESSINGS) ×1 IMPLANT
DRAPE LAPAROSCOPIC ABDOMINAL (DRAPES) ×2 IMPLANT
DRAPE LAPAROTOMY 100X72 PEDS (DRAPES) ×3 IMPLANT
DRAPE UTILITY XL STRL (DRAPES) ×3 IMPLANT
ELECT COATED BLADE 2.86 ST (ELECTRODE) ×3 IMPLANT
ELECT REM PT RETURN 9FT ADLT (ELECTROSURGICAL) ×3
ELECTRODE REM PT RTRN 9FT ADLT (ELECTROSURGICAL) ×1 IMPLANT
GLOVE BIOGEL PI IND STRL 8 (GLOVE) ×1 IMPLANT
GLOVE BIOGEL PI INDICATOR 8 (GLOVE) ×2
GLOVE ECLIPSE 8.0 STRL XLNG CF (GLOVE) ×3 IMPLANT
GOWN STRL REUS W/ TWL LRG LVL3 (GOWN DISPOSABLE) ×2 IMPLANT
GOWN STRL REUS W/TWL LRG LVL3 (GOWN DISPOSABLE) ×6
HEMOSTAT ARISTA ABSORB 3G PWDR (HEMOSTASIS) IMPLANT
HEMOSTAT SNOW SURGICEL 2X4 (HEMOSTASIS) IMPLANT
KIT MARKER MARGIN INK (KITS) ×3 IMPLANT
NDL HYPO 25X1 1.5 SAFETY (NEEDLE) ×1 IMPLANT
NEEDLE HYPO 25X1 1.5 SAFETY (NEEDLE) ×3 IMPLANT
NS IRRIG 1000ML POUR BTL (IV SOLUTION) ×3 IMPLANT
PACK BASIN DAY SURGERY FS (CUSTOM PROCEDURE TRAY) ×3 IMPLANT
PENCIL SMOKE EVACUATOR (MISCELLANEOUS) ×5 IMPLANT
SLEEVE SCD COMPRESS KNEE MED (MISCELLANEOUS) ×3 IMPLANT
SPONGE LAP 4X18 RFD (DISPOSABLE) ×5 IMPLANT
SUT MNCRL AB 4-0 PS2 18 (SUTURE) ×3 IMPLANT
SUT SILK 2 0 SH (SUTURE) IMPLANT
SUT VICRYL 3-0 CR8 SH (SUTURE) ×5 IMPLANT
SYR CONTROL 10ML LL (SYRINGE) ×3 IMPLANT
TOWEL GREEN STERILE FF (TOWEL DISPOSABLE) ×3 IMPLANT
TRAY FAXITRON CT DISP (TRAY / TRAY PROCEDURE) ×5 IMPLANT
TUBE CONNECTING 20'X1/4 (TUBING) ×1
TUBE CONNECTING 20X1/4 (TUBING) ×1 IMPLANT
YANKAUER SUCT BULB TIP NO VENT (SUCTIONS) IMPLANT

## 2019-09-20 NOTE — Transfer of Care (Signed)
Immediate Anesthesia Transfer of Care Note  Patient: [name]  Procedure(s) Performed: BILATERAL BREAST LUMPECTOMIES WITH BILATERAL RADIOACTIVE SEED LOCALIZATION AND EXCISION OF RIGHT NIPPLE (Bilateral Breast)  Patient Location: PACU  Anesthesia Type:General  Level of Consciousness: awake  Airway & Oxygen Therapy: Patient Spontanous Breathing and Patient connected to face mask oxygen  Post-op Assessment: Report given to RN and Post -op Vital signs reviewed and stable  Post vital signs: Reviewed and stable  Last Vitals:  Vitals Value Taken Time  BP    Temp    Pulse    Resp    SpO2      Last Pain:  Vitals:   09/20/19 1108  TempSrc: Tympanic  PainSc: 0-No pain         Complications: No apparent anesthesia complications

## 2019-09-20 NOTE — Op Note (Signed)
Preoperative diagnosis: Right breast DCIS with right nipple lesion and left breast radial scar  Postop diagnosis: Same  Procedure: Right breast seed lumpectomy with excision of right nipple and left breast seed lumpectomy  Surgeon: Thomas Cornett, MD  Anesthesia: LMA with local anesthetic 0.5% Marcaine  EBL: Minimal  Specimen right nipple to pathology with right upper central breast tissue to pathology with seed and clip in left upper outer quadrant breast tissue with seed and clip verified all with Faxitron  Drains: None  IV fluids: Per anesthesia record  Indications for procedure: Patient 78-year-old female who was found to have bilateral mammographic abnormality the mass involving the tip of her right nipple.  The right-sided core biopsy showed DCIS intermediate grade hormone receptor positive and left breast radial scar.  She also draining mass at the tip of her right nipple.  Punch biopsy office was inconclusive and I recommend excision in the operating room for the above.The procedure has been discussed with the patient. Alternatives to surgery have been discussed with the patient.  Risks of surgery include bleeding,  Infection,  Seroma formation, death,  and the need for further surgery.   The patient understands and wishes to proceed.   Description of procedure: The patient was met in holding area and questions were answered.  The neoprobe was used to identify both seeds 1 in each breast.  Questions were answered.  She was then taken back to the operating.  She is placed supine upon the OR table.  After induction of general esthesia both breasts were prepped draped sterile fashion and timeout was performed.  The right side was done first.  The tip of the nipple itself had a draining 1 cm lesion.  I excised the tip but was able to preserve the nipple areolar complex.  This was sent for frozen section was found to be consistent with primary adenosis.  This was then closed with a  figure-of-eight 3-0 Vicryl.  The neoprobe was used to identify the seed in the right breast.  Films were upper orientation.  In the upper central breast a transverse incision was made.  Dissection was carried down to the subcutaneous breast tissue.  The seed and clip were identified using the neoprobe and all tissue around this was excised with a grossly negative margin.  The Faxitron image revealed the seed and clip to be present.  Clips were placed to mark the cavity and wounds closed with 3-0 Vicryl for Monocryl.  The left side was done next.  The neoprobe was used identify the seed left breast upper outer quadrant.  Curvilinear incision was made in the upper outer quadrant dissection was carried down.  There is a large hematoma associated from the previous biopsy and excised the tissue to include the hematoma.  The seed was removed separately.  This was sent in a separate container.  Imaging of the tissue showed the clip from the initial biopsy be present.  Local anesthetic was infiltrated in the periphery of the cavity.  Hemostasis was achieved.  This wound was closed with 3-0 Vicryl and 4-0 Monocryl.  Dermabond was applied to all 3 incisions.  All counts were found to be correct.  The patient was awoke extubated taken recovery in satisfactory condition. 

## 2019-09-20 NOTE — Discharge Instructions (Signed)
NO TYLENOL before 5:30 PM.  [employer] Phone Number [phone]  BREAST BIOPSY/ PARTIAL MASTECTOMY: POST OP INSTRUCTIONS  Always review your discharge instruction sheet given to you by the facility where your surgery was performed.  IF YOU HAVE DISABILITY OR FAMILY LEAVE FORMS, YOU MUST BRING THEM TO THE OFFICE FOR PROCESSING.  DO NOT GIVE THEM TO YOUR DOCTOR.  1. A prescription for pain medication may be given to you upon discharge.  Take your pain medication as prescribed, if needed.  If narcotic pain medicine is not needed, then you may take acetaminophen (Tylenol) or ibuprofen (Advil) as needed. 2. Take your usually prescribed medications unless otherwise directed 3. If you need a refill on your pain medication, please contact your pharmacy.  They will contact our office to request authorization.  Prescriptions will not be filled after 5pm or on week-ends. 4. You should eat very light the first 24 hours after surgery, such as soup, crackers, pudding, etc.  Resume your normal diet the day after surgery. 5. Most patients will experience some swelling and bruising in the breast.  Ice packs and a good support bra will help.  Swelling and bruising can take several days to resolve.  6. It is common to experience some constipation if taking pain medication after surgery.  Increasing fluid intake and taking a stool softener will usually help or prevent this problem from occurring.  A mild laxative (Milk of Magnesia or Miralax) should be taken according to package directions if there are no bowel movements after 48 hours. 7. Unless discharge instructions indicate otherwise, you may remove your bandages 24-48 hours after surgery, and you may shower at that time.  You may have steri-strips (small skin tapes) in place directly over the incision.  These strips should be left on the skin for 7-10 days.  If your surgeon used skin glue on the incision, you may shower in 24 hours.  The  glue will flake off over the next 2-3 weeks.  Any sutures or staples will be removed at the office during your follow-up visit. 8. ACTIVITIES:  You may resume regular daily activities (gradually increasing) beginning the next day.  Wearing a good support bra or sports bra minimizes pain and swelling.  You may have sexual intercourse when it is comfortable. a. You may drive when you no longer are taking prescription pain medication, you can comfortably wear a seatbelt, and you can safely maneuver your car and apply brakes. b. RETURN TO WORK:  ______________________________________________________________________________________ 9. You should see your doctor in the office for a follow-up appointment approximately two weeks after your surgery.  Your doctor's nurse will typically make your follow-up appointment when she calls you with your pathology report.  Expect your pathology report 2-3 business days after your surgery.  You may call to check if you do not hear from [place] after three days. 10. OTHER INSTRUCTIONS: _______________________________________________________________________________________________ _____________________________________________________________________________________________________________________________________ _____________________________________________________________________________________________________________________________________ _____________________________________________________________________________________________________________________________________  WHEN TO CALL YOUR DOCTOR: 1. Fever over 101.0 2. Nausea and/or vomiting. 3. Extreme swelling or bruising. 4. Continued bleeding from incision. 5. Increased pain, redness, or drainage from the incision.  The clinic staff is available to answer your questions during regular business hours.  Please don't hesitate to call and ask to speak to one of the nurses for clinical concerns.  If you have a medical  emergency, go to the nearest emergency room or call 911.  A surgeon from [facility] is always on call at the hospital.  For  further questions, please visit [email or web address]    Post Anesthesia Home Care Instructions  Activity: Get plenty of rest for the remainder of the day. A responsible individual must stay with you for 24 hours following the procedure.  For the next 24 hours, DO NOT: -Drive a car -Paediatric nurse -Drink alcoholic beverages -Take any medication unless instructed by your physician -Make any legal decisions or sign important papers.  Meals: Start with liquid foods such as gelatin or soup. Progress to regular foods as tolerated. Avoid greasy, spicy, heavy foods. If nausea and/or vomiting occur, drink only clear liquids until the nausea and/or vomiting subsides. Call your physician if vomiting continues.  Special Instructions/Symptoms: Your throat may feel dry or sore from the anesthesia or the breathing tube placed in your throat during surgery. If this causes discomfort, gargle with warm salt water. The discomfort should disappear within 24 hours.  If you had a scopolamine patch placed behind your ear for the management of post- operative nausea and/or vomiting:  1. The medication in the patch is effective for 72 hours, after which it should be removed.  Wrap patch in a tissue and discard in the trash. Wash hands thoroughly with soap and water. 2. You may remove the patch earlier than 72 hours if you experience unpleasant side effects which may include dry mouth, dizziness or visual disturbances. 3. Avoid touching the patch. Wash your hands with soap and water after contact with the patch.

## 2019-09-20 NOTE — Anesthesia Procedure Notes (Signed)
Procedure Name: LMA Insertion Date/Time: 09/20/2019 12:34 PM Performed by: Lieutenant Diego, CRNA Pre-anesthesia Checklist: Patient identified, Emergency Drugs available, Suction available and Patient being monitored Patient Re-evaluated:Patient Re-evaluated prior to induction Oxygen Delivery Method: Circle system utilized Preoxygenation: Pre-oxygenation with 100% oxygen Induction Type: IV induction Ventilation: Mask ventilation without difficulty LMA: LMA inserted LMA Size: 4.0 Number of attempts: 1 Placement Confirmation: positive ETCO2 and breath sounds checked- equal and bilateral Tube secured with: Tape Dental Injury: Teeth and Oropharynx as per pre-operative assessment

## 2019-09-20 NOTE — Interval H&P Note (Signed)
History and Physical Interval Note:  09/20/2019 12:11 PM  [name]  has presented today for surgery, with the diagnosis of RIGHT DUCTAL CARCINOMA INSITU, LEFT RADIAL NIPPLE MASS.  The various methods of treatment have been discussed with the patient and family. After consideration of risks, benefits and other options for treatment, the patient has consented to  Procedure(s): BILATERAL BREAST LUMPECTOMIES WITH BILATERAL RADIOACTIVE SEED LOCALIZATION AND EXCISION OF RIGHT NIPPLE (Bilateral) as a surgical intervention.  The patient's history has been reviewed, patient examined, no change in status, stable for surgery.  I have reviewed the patient's chart and labs.  Questions were answered to the patient's satisfaction.     [place]

## 2019-09-20 NOTE — Anesthesia Preprocedure Evaluation (Addendum)
Anesthesia Evaluation  Patient identified by MRN, date of birth, ID band Patient awake    Reviewed: Allergy & Precautions, NPO status , Patient's Chart, lab work & pertinent test results  History of Anesthesia Complications Negative for: history of anesthetic complications  Airway Mallampati: II  TM Distance: >3 FB Neck ROM: Full    Dental  (+) Caps, Dental Advisory Given   Pulmonary neg pulmonary ROS, former smoker (quit 1979),  09/16/2019 SARS coronavirus NEG   breath sounds clear to auscultation       Cardiovascular hypertension, Pt. on medications (-) angina+ dysrhythmias  Rhythm:Regular Rate:Normal  09/15/2019 Nuclear stress EF: 61%. No wall motion abnormality, There was no ST segment deviation noted during stress.  The study is normal. '19 ECHO: EF 60-65%, valves OK   Neuro/Psych negative neurological ROS     GI/Hepatic Neg liver ROS, GERD  Controlled,  Endo/Other  Morbid obesity  Renal/GU negative Renal ROS     Musculoskeletal  (+) Arthritis ,   Abdominal (+) + obese,   Peds  Hematology negative hematology ROS (+)   Anesthesia Other Findings   Reproductive/Obstetrics                            Anesthesia Physical Anesthesia Plan  ASA: III  Anesthesia Plan: General   Post-op Pain Management:    Induction: Intravenous  PONV Risk Score and Plan: 3 and Ondansetron, Dexamethasone and Treatment may vary due to age or medical condition  Airway Management Planned: LMA  Additional Equipment:   Intra-op Plan:   Post-operative Plan:   Informed Consent: I have reviewed the patients History and Physical, chart, labs and discussed the procedure including the risks, benefits and alternatives for the proposed anesthesia with the patient or authorized representative who has indicated his/her understanding and acceptance.     Dental advisory given  Plan Discussed with: Surgeon  and CRNA  Anesthesia Plan Comments:         Anesthesia Quick Evaluation

## 2019-09-20 NOTE — Anesthesia Postprocedure Evaluation (Signed)
Anesthesia Post Note  Patient: [name]  Procedure(s) Performed: BILATERAL BREAST LUMPECTOMIES WITH BILATERAL RADIOACTIVE SEED LOCALIZATION AND RIGHT NIPPLE BIOPSY (Bilateral Breast)     Patient location during evaluation: PACU Anesthesia Type: General Level of consciousness: awake and alert, patient cooperative and oriented Pain management: pain level controlled Vital Signs Assessment: post-procedure vital signs reviewed and stable Respiratory status: spontaneous breathing, nonlabored ventilation and respiratory function stable Cardiovascular status: blood pressure returned to baseline and stable Postop Assessment: no apparent nausea or vomiting Anesthetic complications: no    Last Vitals:  Vitals:   09/20/19 1415 09/20/19 1430  BP: 125/60 (!) 136/55  Pulse: 70 71  Resp: 17 13  Temp:    SpO2: 100% 94%    Last Pain:  Vitals:   09/20/19 1430  TempSrc:   PainSc: 5                  Wilhemina Grall,E. Emmajane Altamura

## 2019-09-21 ENCOUNTER — Encounter: Payer: Self-pay | Admitting: *Deleted

## 2019-09-27 LAB — SURGICAL PATHOLOGY

## 2019-10-02 NOTE — Progress Notes (Signed)
Location of Breast Cancer: Right Breast  Histology per Pathology Report:  08/24/19 Diagnosis 1. Breast, left, needle core biopsy, central lateral - COMPLEX SCLEROSING LESION. - FIBROCYSTIC CHANGE AND USUAL DUCTAL TYPE LESION. - NO MALIGNANCY IDENTIFIED. 2. 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 - BACKGROUND FEATURES SUGGESTIVE OF HAMARTOMATOUS LESION.  Receptor Status: ER(100%), PR (70%)  09/20/19 FINAL MICROSCOPIC DIAGNOSIS: A. NIPPLE, RIGHT, BIOPSY: - Nipple adenoma - Intraductal papilloma - No malignancy identified - See comment B. 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 BREAST, LEFT W/SEED, LUMPECTOMY: - Complex sclerosing lesion - Previous biopsy site changes - No residual carcinoma identified  Did patient present with symptoms or was this found on screening mammography?: She presented with bloody discharge to her nipple on her right breast.   Past/Anticipated interventions by surgeon, if any: 09/20/19 Procedure: Right breast seed lumpectomy with excision of right nipple and left breast seed lumpectomy  Surgeon: Erroll Luna, MD  Past/Anticipated interventions by medical oncology, if any:  10/05/19 Dr. Lindi Adie Treatment plan: Adjuvant antiestrogen therapy with anastrozole 1 mg daily x5 years Given her advanced age and negative margins, I discussed with her about opting between radiation versus antiestrogen therapy. Patient is leaning towards doing radiation alone.  She has an appointment to see Dr. Isidore Moos coming up. Her daughter wants to finish radiation and think about antiestrogen therapy as well.  Return to clinic at the end of radiation.    Lymphedema issues, if any:  She  denies.   Pain issues, if any:  She reports mild tenderness to her surgery site.   SAFETY ISSUES:  Prior radiation? No  Pacemaker/ICD? No  Possible current pregnancy? No  Is the patient on methotrexate? No  Current Complaints / other details:        [name], Stephani Police, RN 10/02/2019,11:30 AM

## 2019-10-04 ENCOUNTER — Other Ambulatory Visit: Payer: Medicare Other

## 2019-10-04 ENCOUNTER — Ambulatory Visit: Payer: Medicare Other | Admitting: Oncology

## 2019-10-04 NOTE — Progress Notes (Signed)
[facility] CONSULT NOTE  Patient Care Team: [name], MD as PCP - General Josue Hector, MD as PCP - Cardiology (Cardiology) Marvene Staff as Physician Assistant (Rheumatology) Christain Sacramento, [place] as Referring Physician (Optometry) Danella Sensing, MD as Consulting Physician (Dermatology)  CHIEF COMPLAINTS/PURPOSE OF CONSULTATION:  Newly diagnosed breast cancer  HISTORY OF PRESENTING ILLNESS:  [name] 78 y.o. female is here because of recent diagnosis of ductal carcinoma in situ of the right breast. The patient reported two weeks of spontaneous right nipple discharge. 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 Biopsy on 07/05/19 showed fibrocystic changes in the left breast with no evidence of malignancy. Breast MRI on 08/11/19 showed a suspicious 1.7cm mass at the base of the right nipple, a 0.9cm mass in the superior central right breast, and indeterminate non-mass enhancement in the central left breast. Biopsy on 08/24/19 showed no evidence of malignancy in the left breast, and in the right breast, ductal carcinoma in situ with necrosis, high grade, ER+ 100%, PR+ 70%. She underwent bilateral lumpectomies on 09/20/19 with Dr. [name] for which pathology showed a complex sclerosing lesion in the left breast with no evidence of malignancy, and in the right breast, nipple adenoma with intraductal papilloma and DCIS, intermediate grade, 0.4cm, clear margins. She presents to the clinic today for initial evaluation and discussion of treatment options.   I reviewed her records extensively and collaborated the history with the patient.  SUMMARY OF ONCOLOGIC HISTORY: Oncology History  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/27/2019 Initial Diagnosis   Patient reported two weeks of spontaneous right nipple discharge.  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 Biopsy on 07/05/19 showed fibrocystic changes in the left breast, no evidence of malignancy. Breast MRI on 08/11/19 showed a 1.7cm right nipple base mass, a 0.9cm superior central right breast mass, and indeterminate non-mass enhancement in the central left breast. Biopsy on 08/24/19 showed no evidence of malignancy in the left breast, and in the right breast, DCIS with necrosis, high grade, ER+ 100%, PR+ 70%.    09/20/2019 Surgery   Bilateral lumpectomies ([facility]): Left breast: complex sclerosing lesion with no evidence of malignancy Right breast: nipple adenoma with intraductal papilloma and DCIS, intermediate grade, 0.4cm, clear margins.     MEDICAL HISTORY:  Past Medical History:  Diagnosis Date  . Cancer [facility])    breast  . Cataract    diagnosed by Dr. Phineas Douglas  . Dysrhythmia    PSVT/ palpitations-  controlled with prn Inderal  . GERD (gastroesophageal reflux disease)   . Hyperlipidemia    statin intolerant  . Hypertension    eccho 4/13 EPIC  . Migraines   . Obesity   . Osteoarthritis   . Phlebitis of leg, right, superficial 1986/1989   x 2    SURGICAL HISTORY: Past Surgical History:  Procedure Laterality Date  . BREAST LUMPECTOMY WITH RADIOACTIVE SEED LOCALIZATION Bilateral 09/20/2019   Procedure: BILATERAL BREAST LUMPECTOMIES WITH BILATERAL RADIOACTIVE SEED LOCALIZATION AND RIGHT NIPPLE BIOPSY;  Surgeon: Erroll Luna, MD;  Location: [facility];  Service: General;  Laterality: Bilateral;  . CARDIAC CATHETERIZATION  1990s   negative  . CARDIOVASCULAR STRESS TEST  2008  NML  . CARDIOVASCULAR STRESS TEST  02/02/2007   EF 70%, NO ISCHEMIA  . DILATION AND CURETTAGE, DIAGNOSTIC / THERAPEUTIC  2008  . LESION DESTRUCTION  10/2017   Face; Dr. Ronnald Ramp  . ROTATOR CUFF REPAIR  1999   Right, left  in 2002  . 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 US ECHOCARDIOGRAPHY  02/07/2007   EF 55-60%    SOCIAL HISTORY: Social History   Socioeconomic History  . Marital status: Married    Spouse name: Not on file  . Number of children: Not on file  . Years of education: Not on file  . Highest education level: Not on file  Occupational History  . Occupation: Retired  Tobacco Use  . Smoking status: Former Smoker    Packs/day: 1.00    Years: 5.00    Pack years: 5.00    Quit date: 03/15/1978    Years since quitting: 41.5  . Smokeless tobacco: Never Used  . Tobacco comment: quit smoking 40 years ago  Substance and Sexual Activity  . Alcohol use: No    Comment: stopped 5 years ago  . Drug use: No  . Sexual activity: Yes  Other Topics Concern  . Not on file  Social History Narrative   Regular exercise: yes walks 1 mile a day   Diet: loves butter, fruit and veggies   Social Determinants of Health   Financial Resource Strain:   . Difficulty of Paying Living Expenses: Not on file  Food Insecurity:   . Worried About Charity fundraiser in the Last Year: Not on file  . Ran Out of Food in the Last Year: Not on file  Transportation Needs:   . Lack of Transportation (Medical): Not on file  . Lack of Transportation (Non-Medical): Not on file  Physical Activity:   . Days of Exercise per Week: Not on file  . Minutes of Exercise per Session: Not on file  Stress:   . Feeling of Stress : Not on file  Social Connections:   . Frequency of Communication with Friends and Family: Not on file  . Frequency of Social Gatherings with Friends and Family: Not on file  . Attends Religious Services: Not on file  . Active Member of Clubs or Organizations: Not on file  . Attends Archivist Meetings: Not on file  . Marital  Status: Not on file  Intimate Partner Violence:   . Fear of Current or Ex-Partner: Not on file  . Emotionally Abused: Not on file  . Physically Abused: Not on file  . Sexually Abused: Not on file    FAMILY HISTORY: Family History  Problem Relation Age of Onset  . Heart attack Father 65       PUD  . Hypertension Father   . Lung cancer Brother   . Heart defect Sister   . Hypertension Mother   . Coronary artery disease Other        Female 1st degree relative <50    ALLERGIES:  is allergic to codeine and hydrocodone-homatropine.  MEDICATIONS:  Current Outpatient Medications  Medication Sig Dispense Refill  . acetaminophen (TYLENOL) 500 MG tablet Take 1,000 mg by mouth every 6 (six) hours as needed.    Marland Kitchen FOLIC ACID PO Take 20 mg by mouth daily.     Marland Kitchen  hydrochlorothiazide (HYDRODIURIL) 25 MG tablet Take 1 tablet by mouth once daily 90 tablet 1  . ibuprofen (ADVIL) 800 MG tablet Take 0.5 tablets (400 mg total) by mouth every 8 (eight) hours as needed. 30 tablet 0  . mometasone (ELOCON) 0.1 % ointment Apply topically 2 (two) times daily. 45 g 1   No current facility-administered medications for this visit.    REVIEW OF SYSTEMS:   Constitutional: Denies fevers, chills or abnormal night sweats Eyes: Denies blurriness of vision, double vision or watery eyes Ears, nose, mouth, throat, and face: Denies mucositis or sore throat Respiratory: Denies cough, dyspnea or wheezes Cardiovascular: Denies palpitation, chest discomfort or lower extremity swelling Gastrointestinal:  Denies nausea, heartburn or change in bowel habits Skin: Denies abnormal skin rashes Lymphatics: Denies new lymphadenopathy or easy bruising Neurological:Denies numbness, tingling or new weaknesses Behavioral/Psych: Mood is stable, no new changes  Breast: s/p bilateral lumpectomies All other systems were reviewed with the patient and are negative.  PHYSICAL EXAMINATION: ECOG PERFORMANCE STATUS: 1 - Symptomatic but  completely ambulatory  Vitals:   10/05/19 1308  BP: (!) 150/94  Pulse: 70  Resp: 18  Temp: 98.3 F (36.8 C)  SpO2: 96%   Filed Weights   10/05/19 1308  Weight: 214 lb (97.1 kg)    GENERAL:alert, no distress and comfortable SKIN: skin color, texture, turgor are normal, no rashes or significant lesions EYES: normal, conjunctiva are pink and non-injected, sclera clear OROPHARYNX:no exudate, no erythema and lips, buccal mucosa, and tongue normal  NECK: supple, thyroid normal size, non-tender, without nodularity LYMPH:  no palpable lymphadenopathy in the cervical, axillary or inguinal LUNGS: clear to auscultation and percussion with normal breathing effort HEART: regular rate & rhythm and no murmurs and no lower extremity edema ABDOMEN:abdomen soft, non-tender and normal bowel sounds Musculoskeletal:no cyanosis of digits and no clubbing  PSYCH: alert & oriented x 3 with fluent speech NEURO: no focal motor/sensory deficits  LABORATORY DATA:  I have reviewed the data as listed Lab Results  Component Value Date   WBC 10.6 (H) 09/18/2019   HGB 13.3 09/18/2019   HCT 40.4 09/18/2019   MCV 96.2 09/18/2019   PLT 227 09/18/2019   Lab Results  Component Value Date   NA 140 09/18/2019   K 4.2 09/18/2019   CL 104 09/18/2019   CO2 26 09/18/2019    RADIOGRAPHIC STUDIES: I have personally reviewed the radiological reports and agreed with the findings in the report.  ASSESSMENT AND PLAN:  Ductal carcinoma in situ (DCIS) of right breast 09/10/2019:Bilateral lumpectomies ([facility]): Left breast: complex sclerosing lesion with no evidence of malignancy Right breast: nipple adenoma with intraductal papilloma and DCIS, intermediate grade, 0.4cm, clear margins.  ER 100%, PR 70% Tis NX stage 0  Treatment plan: Adjuvant antiestrogen therapy with anastrozole 1 mg daily x5 years Given her advanced age and negative margins, I discussed with her about opting between radiation versus  antiestrogen therapy. Patient is leaning towards doing radiation alone.  She has an appointment to see Dr. Isidore Moos coming up. Her daughter wants to finish radiation and think about antiestrogen therapy as well.  Return to clinic at the end of radiation.   All questions were answered. The patient knows to call the clinic with any problems, questions or concerns.   Rulon Eisenmenger, MD, MPH 10/05/2019    I, Molly Dorshimer, am acting as scribe for Nicholas Lose, MD.  I have reviewed the above documentation for accuracy and completeness, and I agree with the  above.

## 2019-10-05 ENCOUNTER — Other Ambulatory Visit: Payer: Self-pay

## 2019-10-05 ENCOUNTER — Inpatient Hospital Stay: Payer: Medicare Other | Attending: Hematology and Oncology | Admitting: Hematology and Oncology

## 2019-10-05 DIAGNOSIS — Z801 Family history of malignant neoplasm of trachea, bronchus and lung: Secondary | ICD-10-CM | POA: Diagnosis not present

## 2019-10-05 DIAGNOSIS — D0511 Intraductal carcinoma in situ of right breast: Secondary | ICD-10-CM | POA: Diagnosis present

## 2019-10-05 DIAGNOSIS — Z87891 Personal history of nicotine dependence: Secondary | ICD-10-CM

## 2019-10-05 NOTE — Assessment & Plan Note (Signed)
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  ER 100%, PR 70% Tis NX stage 0  Treatment plan: Adjuvant antiestrogen therapy with tamoxifen 20 mg daily x5 years Given her advanced age and negative margins, radiation is optional.  Return to clinic in 3 months for survivorship care plan visit

## 2019-10-09 ENCOUNTER — Encounter: Payer: Self-pay | Admitting: *Deleted

## 2019-10-10 NOTE — Progress Notes (Signed)
Radiation Oncology         [phone] ________________________________  Initial outpatient Consultation by telephone. Patient was unable to access MyChart video during pandemic precautions   Name: LAKETHA LEOPARD MRN: [id number]  Date: 10/11/2019  DOB: 06-15-1941  WU:[id number], Mervyn Gay, MD  [name], MD   REFERRING PHYSICIAN: [name], MD  DIAGNOSIS:    ICD-10-CM   1. Ductal carcinoma in situ (DCIS) of right breast  D05.11    Stage 0 Right Breast DCIS, ER+ / PR+, Grade 2  CHIEF COMPLAINT: Here to discuss management of right breast DCIS  HISTORY OF PRESENT ILLNESS::Ruthetta Jerilynn Mages Mace is a 79 y.o. female who presented with two weeks of spontaneous and expressed right bloody nipple discharge.  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   Biopsy on date of 07/05/2019 showed no malignancy in the upper-inner left breast.   She underwent breast MRI on 08/11/2019, which showed: suspicious 1.7 cm mass at base of right nipple; indeterminate 9 mm mass in superior central right breast; indeterminate non-mass enhancement in central lateral left breast.  MRI-guided biopsies of bilateral breasts on 08/24/2019 showed: no malignancy on the left; DCIS with necrosis on the right, with background features suggestive of hamartomatous lesion, ER status: positive, PR status positive, Grade 3.  She proceeded to bilateral lumpectomies and right nipple biopsy on 09/20/2019, which showed: right nipple with intraductal papilloma; complex sclerosing lesion in the left; right with 0.4 cm DCIS, intermediate grade, and complex sclerosing lesion.  There is no necrosis in the DCIS.  DCIS is grade 2.  Margins are negative, closest anteriorly by 1 mm.     She met with Dr. Lindi Adie on 10/05/2019.  She is not sure yet whether she will take  antiestrogens.  Lymphedema issues, if any:  She denies.   Pain issues, if any:  She reports mild tenderness to her surgery site. No chills or fevers or   SAFETY ISSUES:  Prior radiation? No  Pacemaker/ICD? No  Possible current pregnancy? No  Is the patient on methotrexate? No  Daughter is present for consultation.  Patient is in relatively good health and independent.  PREVIOUS RADIATION THERAPY: No  PAST MEDICAL HISTORY:  has a past medical history of Cancer ([place]), Cataract, Dysrhythmia, GERD (gastroesophageal reflux disease), Hyperlipidemia, Hypertension, Migraines, Obesity, Osteoarthritis, and Phlebitis of leg, right, superficial (1986/1989).    PAST SURGICAL HISTORY: Past Surgical History:  Procedure Laterality Date  . BREAST LUMPECTOMY WITH RADIOACTIVE SEED LOCALIZATION Bilateral 09/20/2019   Procedure: BILATERAL BREAST LUMPECTOMIES WITH BILATERAL RADIOACTIVE SEED LOCALIZATION AND RIGHT NIPPLE BIOPSY;  Surgeon: Erroll Luna, MD;  Location: [facility];  Service: General;  Laterality: Bilateral;  . CARDIAC CATHETERIZATION  1990s   negative  . CARDIOVASCULAR STRESS TEST  2008   NML  . CARDIOVASCULAR STRESS TEST  02/02/2007   EF 70%, NO ISCHEMIA  . DILATION AND CURETTAGE, DIAGNOSTIC / THERAPEUTIC  2008  . LESION DESTRUCTION  10/2017   Face; Dr. Ronnald Ramp  . ROTATOR CUFF REPAIR  1999   Right, left in 2002  . 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 US ECHOCARDIOGRAPHY  02/07/2007   EF 55-60%    FAMILY  HISTORY: family history includes Coronary artery disease in an other family member; Heart attack (age of onset: 36) in her father; Heart defect in her sister; Hypertension in her father and mother; Lung cancer in her brother.  SOCIAL HISTORY:  reports that she quit smoking about 41 years ago. She  has a 5.00 pack-year smoking history. She has never used smokeless tobacco. She reports that she does not drink alcohol or use drugs.  ALLERGIES: Codeine and Hydrocodone-homatropine  MEDICATIONS:  Current Outpatient Medications  Medication Sig Dispense Refill  . acetaminophen (TYLENOL) 500 MG tablet Take 1,000 mg by mouth every 6 (six) hours as needed.    Marland Kitchen FOLIC ACID PO Take 20 mg by mouth daily.     . hydrochlorothiazide (HYDRODIURIL) 25 MG tablet Take 1 tablet by mouth once daily (Patient taking differently: She is taking 12.5 mg daily due to fluid changes. She has permission from her doctor.) 90 tablet 1  . ibuprofen (ADVIL) 800 MG tablet Take 0.5 tablets (400 mg total) by mouth every 8 (eight) hours as needed. 30 tablet 0  . mometasone (ELOCON) 0.1 % ointment Apply topically 2 (two) times daily. 45 g 1   No current facility-administered medications for this encounter.    REVIEW OF SYSTEMS: As above.   PHYSICAL EXAM:  vitals were not taken for this visit.   General: Alert and oriented, in no acute distress  Psychiatric: Judgment and insight are intact. Affect is appropriate.    LABORATORY DATA:  Lab Results  Component Value Date   WBC 10.6 (H) 09/18/2019   HGB 13.3 09/18/2019   HCT 40.4 09/18/2019   MCV 96.2 09/18/2019   PLT 227 09/18/2019   CMP     Component Value Date/Time   NA 140 09/18/2019 1220   K 4.2 09/18/2019 1220   CL 104 09/18/2019 1220   CO2 26 09/18/2019 1220   GLUCOSE 146 (H) 09/18/2019 1220   BUN 11 09/18/2019 1220   CREATININE 0.66 09/18/2019 1220   CALCIUM 9.0 09/18/2019 1220   PROT 7.1 09/18/2019 1220   ALBUMIN 3.8 09/18/2019 1220   AST 30 09/18/2019 1220   ALT 31 09/18/2019 1220   ALKPHOS 74 09/18/2019 1220   BILITOT 0.3 09/18/2019 1220   GFRNONAA >60 09/18/2019 1220   GFRAA >60 09/18/2019 1220         RADIOGRAPHY: MM Breast Surgical Specimen  Result Date: 09/20/2019 CLINICAL DATA:  Status post left lumpectomy. EXAM: SPECIMEN RADIOGRAPH  OF THE LEFT BREAST COMPARISON:  Previous exam(s). FINDINGS: Status post excision of the left breast. The radioactive seed and biopsy marker clip are present, completely intact, and were marked for pathology. The biopsy clip is in the surgical specimen and the radioactive seed was imaged separately. IMPRESSION: Specimen radiograph of the left breast. Electronically Signed   By: Lillia Mountain M.D.   On: 09/20/2019 13:40   MM Breast Surgical Specimen  Result Date: 09/20/2019 CLINICAL DATA:  Status post right lumpectomy. EXAM: SPECIMEN RADIOGRAPH OF THE RIGHT BREAST COMPARISON:  Previous exam(s). FINDINGS: Status post excision of the right breast. The radioactive seed and biopsy marker clip are present, completely intact, and were marked for pathology. IMPRESSION: Specimen radiograph of the right breast. Electronically Signed   By: Lillia Mountain M.D.   On: 09/20/2019 13:10   MYOCARDIAL PERFUSION IMAGING  Result Date: 09/15/2019  Nuclear stress EF: 61%. No wall motion abnormality  There was no ST segment deviation noted during stress.  The study is normal.  This is  a low risk study. No ischemia.  Candee Furbish, MD  MM LT RADIOACTIVE SEED LOC MAMMO GUIDE  Result Date: 09/19/2019 CLINICAL DATA:  Patient presents for seed localization of each breast prior to bilateral lumpectomies. EXAM: MAMMOGRAPHIC GUIDED RADIOACTIVE SEED LOCALIZATION OF THE BILATERAL BREAST COMPARISON:  Previous exam(s). FINDINGS: Patient presents for radioactive seed localization prior to bilateral lumpectomies. I met with the patient and we discussed the procedure of seed localization including benefits and alternatives. We discussed the high likelihood of a successful procedure. We discussed the risks of the procedure including infection, bleeding, tissue injury and further surgery. We discussed the low dose of radioactivity involved in the procedure. Informed, written consent was given. The usual time-out protocol was performed  immediately prior to the procedure. RIGHT breast: Using mammographic guidance, sterile technique, 1% lidocaine and an I-125 radioactive seed, the tissue 1 centimeter MEDIAL to the barbell shaped clip in the UPPER central portion of the RIGHT breast was localized using a craniocaudal approach. The follow-up mammogram images confirm the seed in the expected location and were marked for Dr. [name]. Follow-up survey of the patient confirms presence of radioactive seed. Order number of I-125 seed:  [id number]. Total activity:  2.458 millicuries reference Date: 09/04/2019 LEFT breast: Using mammographic guidance, sterile technique, 1% lidocaine and an I-125 radioactive seed, the barbell shaped clip in the UPPER-OUTER QUADRANT of the LEFT breast was localized using a LATERAL to MEDIAL approach. The follow-up mammogram images confirm the seed in the expected location and were marked for Dr. [name]. Follow-up survey of the patient confirms presence of radioactive seed. Order number of I-125 seed:  [id number]. Total activity:  0.539 millicuries reference Date: 08/24/2019 The patient tolerated the procedure well and was released from the [facility]. She was given instructions regarding seed removal. IMPRESSION: Radioactive seed localization bilateral breasts. No apparent complications. Electronically Signed   By: Nolon Nations M.D.   On: 09/19/2019 15:22   MM RT RADIOACTIVE SEED LOC MAMMO GUIDE  Result Date: 09/19/2019 CLINICAL DATA:  Patient presents for seed localization of each breast prior to bilateral lumpectomies. EXAM: MAMMOGRAPHIC GUIDED RADIOACTIVE SEED LOCALIZATION OF THE BILATERAL BREAST COMPARISON:  Previous exam(s). FINDINGS: Patient presents for radioactive seed localization prior to bilateral lumpectomies. I met with the patient and we discussed the procedure of seed localization including benefits and alternatives. We discussed the high likelihood of a successful procedure. We discussed the risks of  the procedure including infection, bleeding, tissue injury and further surgery. We discussed the low dose of radioactivity involved in the procedure. Informed, written consent was given. The usual time-out protocol was performed immediately prior to the procedure. RIGHT breast: Using mammographic guidance, sterile technique, 1% lidocaine and an I-125 radioactive seed, the tissue 1 centimeter MEDIAL to the barbell shaped clip in the UPPER central portion of the RIGHT breast was localized using a craniocaudal approach. The follow-up mammogram images confirm the seed in the expected location and were marked for Dr. [name]. Follow-up survey of the patient confirms presence of radioactive seed. Order number of I-125 seed:  [id number]. Total activity:  9.024 millicuries reference Date: 09/04/2019 LEFT breast: Using mammographic guidance, sterile technique, 1% lidocaine and an I-125 radioactive seed, the barbell shaped clip in the UPPER-OUTER QUADRANT of the LEFT breast was localized using a LATERAL to MEDIAL approach. The follow-up mammogram images confirm the seed in the expected location and were marked for Dr. [name]. Follow-up survey of the patient confirms presence of radioactive seed. Order number of I-125 seed:  [id number]. Total activity:  3.149 millicuries reference Date: 08/24/2019 The patient tolerated the procedure well and was released from the [facility]. She was given instructions regarding seed removal. IMPRESSION: Radioactive seed localization bilateral breasts. No apparent complications. Electronically Signed   By: Nolon Nations M.D.   On: 09/19/2019 15:22      IMPRESSION/PLAN: Right Breast DCIS   Patient anticipates follow-up next week with her surgeon.  It was a pleasure meeting the patient today. We discussed the risks, benefits, and side effects of radiotherapy. I recommend radiotherapy to the right breast to reduce her risk of locoregional recurrence by 1/2.  We discussed that radiation  would take approximately 4 weeks to complete and that I would give the patient a few weeks to heal following surgery before starting treatment planning. We spoke about acute effects including skin irritation and fatigue as well as much less common late effects including internal organ injury or irritation. We spoke about the latest technology that is used to minimize the risk of late effects for patients undergoing radiotherapy to the breast or chest wall. No guarantees of treatment were given.   The patient is enthusiastic about proceeding with treatment but she does have concerns about the Covid pandemic.  She is hoping to receive a vaccine in the near future.  I spoke with the patient and her daughter about delaying her radiation therapy by a little bit to allow for her to receive the vaccine, both shots, before treatment planning and treatment delivery.  Her disease is relatively low risk and delaying radiation for more than 2 months after surgery is unlikely to impair her prognosis.  I informed her that the [facility] is now scheduling vaccines for folks that are 83 years of age and older and they anticipate starting to administer the vaccines on January 11.  I gave the patient the contact information to call the health department and schedule her first vaccine.  I will tentatively schedule her radiation planning to take place during the week of February 15.  If needed we can move that appointment up or down depending on the timeline of her vaccines.  She and her daughter are very pleased with this plan. I look forward to participating in the patient's care.     This encounter was provided by telemedicine platform by telephone as patient was unable to access MyChart video during pandemic precautions The patient has given verbal consent for this type of encounter and has been advised to only accept a meeting of this type in a secure network environment. The total time spent during  this encounter on date of service was 60 minutes.  The attendants for this meeting include Eppie Gibson  and [name].  During the encounter, Eppie Gibson was located at [facility] Radiation Oncology Department.  CHANIQUA BRISBY was located at home.    __________________________________________   Eppie Gibson, MD   This document serves as a record of services personally performed by Eppie Gibson, MD. It was created on her behalf by Wilburn Mylar, a trained medical scribe. The creation of this record is based on the scribe's personal observations and the provider's statements to them. This document has been checked and approved by the attending provider.

## 2019-10-11 ENCOUNTER — Ambulatory Visit
Admission: RE | Admit: 2019-10-11 | Discharge: 2019-10-11 | Disposition: A | Discharge status: Home / Self Care | Payer: Medicare Other | Source: Ambulatory Visit | Attending: Radiation Oncology | Admitting: Radiation Oncology

## 2019-10-11 ENCOUNTER — Encounter: Payer: Self-pay | Admitting: Radiation Oncology

## 2019-10-11 ENCOUNTER — Other Ambulatory Visit: Payer: Self-pay

## 2019-10-11 DIAGNOSIS — D0511 Intraductal carcinoma in situ of right breast: Secondary | ICD-10-CM

## 2019-10-12 ENCOUNTER — Encounter: Payer: Self-pay | Admitting: *Deleted

## 2019-10-12 ENCOUNTER — Telehealth: Payer: Self-pay | Admitting: Hematology and Oncology

## 2019-10-12 NOTE — Telephone Encounter (Signed)
Scheduled per 1/7 sch msg. Called and spoke with pt, confirmed 3/16 appt

## 2019-10-20 ENCOUNTER — Ambulatory Visit: Payer: Medicare Other | Attending: Internal Medicine

## 2019-10-20 DIAGNOSIS — Z23 Encounter for immunization: Secondary | ICD-10-CM

## 2019-10-20 NOTE — Progress Notes (Signed)
   Covid-19 Vaccination Clinic  Name:  [name]    MRN: [id number] DOB: [birth year]  10/20/2019  [name] was observed post Covid-19 immunization for 15 minutes without incidence. She was provided with Vaccine Information Sheet and instruction to access the V-Safe system.   [name] was instructed to call 911 with any severe reactions post vaccine: Marland Kitchen Difficulty breathing  . Swelling of your face and throat  . A fast heartbeat  . A bad rash all over your body  . Dizziness and weakness    Immunizations Administered    Name Date Dose VIS Date Route   Pfizer COVID-19 Vaccine 10/20/2019 12:42 PM 0.3 mL 09/15/2019 Intramuscular   Manufacturer: [place]   Lot: [id number]   [place]: [id number]

## 2019-11-10 ENCOUNTER — Ambulatory Visit: Payer: Medicare Other | Attending: Internal Medicine

## 2019-11-10 DIAGNOSIS — Z23 Encounter for immunization: Secondary | ICD-10-CM | POA: Insufficient documentation

## 2019-11-10 NOTE — Progress Notes (Signed)
   Covid-19 Vaccination Clinic  Name:  [name]    MRN: [id number] DOB: 1940-12-14  11/10/2019  [name] was observed post Covid-19 immunization for 15 minutes without incidence. She was provided with Vaccine Information Sheet and instruction to access the V-Safe system.   [name] was instructed to call 911 with any severe reactions post vaccine: Marland Kitchen Difficulty breathing  . Swelling of your face and throat  . A fast heartbeat  . A bad rash all over your body  . Dizziness and weakness    Immunizations Administered    Name Date Dose VIS Date Route   Pfizer COVID-19 Vaccine 11/10/2019  1:27 PM 0.3 mL 09/15/2019 Intramuscular   Manufacturer: [place]   Lot: [id number]   [place]: [id number]

## 2019-11-21 ENCOUNTER — Other Ambulatory Visit: Payer: Self-pay

## 2019-11-21 ENCOUNTER — Ambulatory Visit
Admission: RE | Admit: 2019-11-21 | Discharge: 2019-11-21 | Disposition: A | Discharge status: Home / Self Care | Payer: Medicare Other | Source: Ambulatory Visit | Attending: Radiation Oncology | Admitting: Radiation Oncology

## 2019-11-21 DIAGNOSIS — D0511 Intraductal carcinoma in situ of right breast: Secondary | ICD-10-CM | POA: Diagnosis present

## 2019-11-21 DIAGNOSIS — Z510 Encounter for antineoplastic radiation therapy: Secondary | ICD-10-CM | POA: Insufficient documentation

## 2019-11-21 NOTE — Progress Notes (Signed)
  Radiation Oncology         (336) [phone] ________________________________  Name: [name] MRN: [id number]  Date: 11/21/2019  DOB: 1941-06-28  SIMULATION AND TREATMENT PLANNING NOTE    Outpatient  DIAGNOSIS:     ICD-10-CM   1. Ductal carcinoma in situ (DCIS) of right breast  D05.11     NARRATIVE:  The patient was brought to the [facility].  Identity was confirmed.  All relevant records and images related to the planned course of therapy were reviewed.  The patient freely provided informed written consent to proceed with treatment after reviewing the details related to the planned course of therapy. The consent form was witnessed and verified by the simulation staff.    Then, the patient was set-up in a stable reproducible supine position for radiation therapy with her ipsilateral arm over her head, and her upper body secured in a custom-made Vac-lok device.  CT images were obtained.  Surface markings were placed.  The CT images were loaded into the planning software.    TREATMENT PLANNING NOTE: Treatment planning then occurred.  The radiation prescription was entered and confirmed.     A total of 3 medically necessary complex treatment devices were fabricated and supervised by me: 2 fields with MLCs for custom blocks to protect heart, and lungs;  and, a Vac-lok. MORE COMPLEX DEVICES MAY BE MADE IN DOSIMETRY FOR FIELD IN FIELD BEAMS FOR DOSE HOMOGENEITY.  I have requested : 3D Simulation which is medically necessary to give adequate dose to at risk tissues while sparing lungs and heart.  I have requested a DVH of the following structures: lungs, heart, right lumpectomy cavity.    The patient will receive 40.05 Gy in 15 fractions to the right breast with 2 tangential fields.   This will be followed by a boost.  Optical Surface Tracking Plan:  Since intensity modulated radiotherapy (IMRT) and 3D conformal radiation treatment methods are predicated on accurate and  precise positioning for treatment, intrafraction motion monitoring is medically necessary to ensure accurate and safe treatment delivery. The ability to quantify intrafraction motion without excessive ionizing radiation dose can only be performed with optical surface tracking. Accordingly, surface imaging offers the opportunity to obtain 3D measurements of patient position throughout IMRT and 3D treatments without excessive radiation exposure. I am ordering optical surface tracking for this patient's upcoming course of radiotherapy.  ________________________________   Reference:  Ursula Alert, J, et al. Surface imaging-based analysis of intrafraction motion for breast radiotherapy patients.Journal of [place], n. 6, nov. 2014. ISSN [id number].  Available at: <http://www.jacmp.org/index.php/jacmp/article/view/4957>.    -----------------------------------  Eppie Gibson, MD

## 2019-11-22 DIAGNOSIS — Z510 Encounter for antineoplastic radiation therapy: Secondary | ICD-10-CM | POA: Diagnosis not present

## 2019-11-27 ENCOUNTER — Other Ambulatory Visit: Payer: Self-pay

## 2019-11-27 ENCOUNTER — Ambulatory Visit
Admission: RE | Admit: 2019-11-27 | Discharge: 2019-11-27 | Disposition: A | Discharge status: Home / Self Care | Payer: Medicare Other | Source: Ambulatory Visit | Attending: Radiation Oncology | Admitting: Radiation Oncology

## 2019-11-27 DIAGNOSIS — D0511 Intraductal carcinoma in situ of right breast: Secondary | ICD-10-CM

## 2019-11-27 DIAGNOSIS — Z510 Encounter for antineoplastic radiation therapy: Secondary | ICD-10-CM | POA: Diagnosis not present

## 2019-11-27 MED ORDER — ALRA NON-METALLIC DEODORANT (RAD-ONC)
1.0000 "application " | Freq: Once | TOPICAL | Status: AC
  Administered 2019-11-27: 1 via TOPICAL

## 2019-11-27 MED ORDER — SONAFINE EX EMUL
1.0000 "application " | Freq: Once | CUTANEOUS | Status: AC
  Administered 2019-11-27: 1 via TOPICAL

## 2019-11-27 NOTE — Progress Notes (Signed)

## 2019-11-28 ENCOUNTER — Ambulatory Visit
Admission: RE | Admit: 2019-11-28 | Discharge: 2019-11-28 | Disposition: A | Discharge status: Home / Self Care | Payer: Medicare Other | Source: Ambulatory Visit | Attending: Radiation Oncology | Admitting: Radiation Oncology

## 2019-11-28 ENCOUNTER — Other Ambulatory Visit: Payer: Self-pay

## 2019-11-28 DIAGNOSIS — Z510 Encounter for antineoplastic radiation therapy: Secondary | ICD-10-CM | POA: Diagnosis not present

## 2019-11-29 ENCOUNTER — Ambulatory Visit
Admission: RE | Admit: 2019-11-29 | Discharge: 2019-11-29 | Disposition: A | Discharge status: Home / Self Care | Payer: Medicare Other | Source: Ambulatory Visit | Attending: Radiation Oncology | Admitting: Radiation Oncology

## 2019-11-29 ENCOUNTER — Other Ambulatory Visit: Payer: Self-pay

## 2019-11-29 DIAGNOSIS — Z510 Encounter for antineoplastic radiation therapy: Secondary | ICD-10-CM | POA: Diagnosis not present

## 2019-11-30 ENCOUNTER — Ambulatory Visit
Admission: RE | Admit: 2019-11-30 | Discharge: 2019-11-30 | Disposition: A | Discharge status: Home / Self Care | Payer: Medicare Other | Source: Ambulatory Visit | Attending: Radiation Oncology | Admitting: Radiation Oncology

## 2019-11-30 ENCOUNTER — Other Ambulatory Visit: Payer: Self-pay

## 2019-11-30 DIAGNOSIS — Z510 Encounter for antineoplastic radiation therapy: Secondary | ICD-10-CM | POA: Diagnosis not present

## 2019-12-01 ENCOUNTER — Ambulatory Visit
Admission: RE | Admit: 2019-12-01 | Discharge: 2019-12-01 | Disposition: A | Discharge status: Home / Self Care | Payer: Medicare Other | Source: Ambulatory Visit | Attending: Radiation Oncology | Admitting: Radiation Oncology

## 2019-12-01 ENCOUNTER — Other Ambulatory Visit: Payer: Self-pay

## 2019-12-01 DIAGNOSIS — Z510 Encounter for antineoplastic radiation therapy: Secondary | ICD-10-CM | POA: Diagnosis not present

## 2019-12-04 ENCOUNTER — Ambulatory Visit
Admission: RE | Admit: 2019-12-04 | Discharge: 2019-12-04 | Disposition: A | Discharge status: Home / Self Care | Payer: Medicare Other | Source: Ambulatory Visit | Attending: Radiation Oncology | Admitting: Radiation Oncology

## 2019-12-04 ENCOUNTER — Other Ambulatory Visit: Payer: Self-pay

## 2019-12-04 DIAGNOSIS — Z510 Encounter for antineoplastic radiation therapy: Secondary | ICD-10-CM | POA: Insufficient documentation

## 2019-12-04 DIAGNOSIS — D0511 Intraductal carcinoma in situ of right breast: Secondary | ICD-10-CM | POA: Diagnosis present

## 2019-12-05 ENCOUNTER — Ambulatory Visit
Admission: RE | Admit: 2019-12-05 | Discharge: 2019-12-05 | Disposition: A | Discharge status: Home / Self Care | Payer: Medicare Other | Source: Ambulatory Visit | Attending: Radiation Oncology | Admitting: Radiation Oncology

## 2019-12-05 ENCOUNTER — Other Ambulatory Visit: Payer: Self-pay

## 2019-12-05 DIAGNOSIS — D0511 Intraductal carcinoma in situ of right breast: Secondary | ICD-10-CM | POA: Diagnosis not present

## 2019-12-06 ENCOUNTER — Ambulatory Visit
Admission: RE | Admit: 2019-12-06 | Discharge: 2019-12-06 | Disposition: A | Discharge status: Home / Self Care | Payer: Medicare Other | Source: Ambulatory Visit | Attending: Radiation Oncology | Admitting: Radiation Oncology

## 2019-12-06 ENCOUNTER — Other Ambulatory Visit: Payer: Self-pay

## 2019-12-06 DIAGNOSIS — D0511 Intraductal carcinoma in situ of right breast: Secondary | ICD-10-CM | POA: Diagnosis not present

## 2019-12-07 ENCOUNTER — Other Ambulatory Visit: Payer: Self-pay

## 2019-12-07 ENCOUNTER — Ambulatory Visit
Admission: RE | Admit: 2019-12-07 | Discharge: 2019-12-07 | Disposition: A | Discharge status: Home / Self Care | Payer: Medicare Other | Source: Ambulatory Visit | Attending: Radiation Oncology | Admitting: Radiation Oncology

## 2019-12-07 DIAGNOSIS — D0511 Intraductal carcinoma in situ of right breast: Secondary | ICD-10-CM | POA: Diagnosis not present

## 2019-12-08 ENCOUNTER — Ambulatory Visit
Admission: RE | Admit: 2019-12-08 | Discharge: 2019-12-08 | Disposition: A | Discharge status: Home / Self Care | Payer: Medicare Other | Source: Ambulatory Visit | Attending: Radiation Oncology | Admitting: Radiation Oncology

## 2019-12-08 ENCOUNTER — Other Ambulatory Visit: Payer: Self-pay

## 2019-12-08 DIAGNOSIS — D0511 Intraductal carcinoma in situ of right breast: Secondary | ICD-10-CM | POA: Diagnosis not present

## 2019-12-11 ENCOUNTER — Ambulatory Visit
Admission: RE | Admit: 2019-12-11 | Discharge: 2019-12-11 | Disposition: A | Discharge status: Home / Self Care | Payer: Medicare Other | Source: Ambulatory Visit | Attending: Radiation Oncology | Admitting: Radiation Oncology

## 2019-12-11 ENCOUNTER — Ambulatory Visit: Payer: Medicare Other | Admitting: Radiation Oncology

## 2019-12-11 ENCOUNTER — Other Ambulatory Visit: Payer: Self-pay

## 2019-12-11 DIAGNOSIS — D0511 Intraductal carcinoma in situ of right breast: Secondary | ICD-10-CM | POA: Diagnosis not present

## 2019-12-11 MED ORDER — SONAFINE EX EMUL
1.0000 "application " | Freq: Once | CUTANEOUS | Status: AC
  Administered 2019-12-11: 1 via TOPICAL

## 2019-12-12 ENCOUNTER — Other Ambulatory Visit: Payer: Self-pay

## 2019-12-12 ENCOUNTER — Ambulatory Visit
Admission: RE | Admit: 2019-12-12 | Discharge: 2019-12-12 | Disposition: A | Discharge status: Home / Self Care | Payer: Medicare Other | Source: Ambulatory Visit | Attending: Radiation Oncology | Admitting: Radiation Oncology

## 2019-12-12 DIAGNOSIS — D0511 Intraductal carcinoma in situ of right breast: Secondary | ICD-10-CM | POA: Diagnosis not present

## 2019-12-13 ENCOUNTER — Other Ambulatory Visit: Payer: Self-pay

## 2019-12-13 ENCOUNTER — Ambulatory Visit
Admission: RE | Admit: 2019-12-13 | Discharge: 2019-12-13 | Disposition: A | Discharge status: Home / Self Care | Payer: Medicare Other | Source: Ambulatory Visit | Attending: Radiation Oncology | Admitting: Radiation Oncology

## 2019-12-13 ENCOUNTER — Ambulatory Visit: Payer: Medicare Other | Admitting: Radiation Oncology

## 2019-12-13 DIAGNOSIS — D0511 Intraductal carcinoma in situ of right breast: Secondary | ICD-10-CM | POA: Diagnosis not present

## 2019-12-14 ENCOUNTER — Ambulatory Visit
Admission: RE | Admit: 2019-12-14 | Discharge: 2019-12-14 | Disposition: A | Discharge status: Home / Self Care | Payer: Medicare Other | Source: Ambulatory Visit | Attending: Radiation Oncology | Admitting: Radiation Oncology

## 2019-12-14 ENCOUNTER — Other Ambulatory Visit: Payer: Self-pay

## 2019-12-14 DIAGNOSIS — D0511 Intraductal carcinoma in situ of right breast: Secondary | ICD-10-CM | POA: Diagnosis not present

## 2019-12-15 ENCOUNTER — Other Ambulatory Visit: Payer: Self-pay

## 2019-12-15 ENCOUNTER — Ambulatory Visit: Payer: Medicare Other | Admitting: Radiation Oncology

## 2019-12-15 ENCOUNTER — Ambulatory Visit
Admission: RE | Admit: 2019-12-15 | Discharge: 2019-12-15 | Disposition: A | Discharge status: Home / Self Care | Payer: Medicare Other | Source: Ambulatory Visit | Attending: Radiation Oncology | Admitting: Radiation Oncology

## 2019-12-15 DIAGNOSIS — D0511 Intraductal carcinoma in situ of right breast: Secondary | ICD-10-CM | POA: Diagnosis not present

## 2019-12-18 ENCOUNTER — Other Ambulatory Visit: Payer: Self-pay

## 2019-12-18 ENCOUNTER — Ambulatory Visit
Admission: RE | Admit: 2019-12-18 | Discharge: 2019-12-18 | Disposition: A | Discharge status: Home / Self Care | Payer: Medicare Other | Source: Ambulatory Visit | Attending: Radiation Oncology | Admitting: Radiation Oncology

## 2019-12-18 DIAGNOSIS — D0511 Intraductal carcinoma in situ of right breast: Secondary | ICD-10-CM | POA: Diagnosis not present

## 2019-12-18 MED ORDER — SONAFINE EX EMUL
1.0000 "application " | Freq: Once | CUTANEOUS | Status: AC
  Administered 2019-12-18: 1 via TOPICAL

## 2019-12-18 NOTE — Progress Notes (Signed)
Patient Care Team: [name], MD as PCP - General Josue Hector, MD as PCP - Cardiology (Cardiology) Rosita Kea, PA-C as Physician Assistant (Rheumatology) Christain Sacramento, [place] as Referring Physician (Optometry) Danella Sensing, MD as Consulting Physician (Dermatology) Mauro Kaufmann, RN as Oncology Nurse Navigator Rockwell Germany, RN as Oncology Nurse Navigator  DIAGNOSIS:    ICD-10-CM   1. Ductal carcinoma in situ (DCIS) of right breast  D05.11     SUMMARY OF ONCOLOGIC HISTORY: Oncology History  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/27/2019 Initial Diagnosis   Patient reported two weeks of spontaneous right nipple discharge. 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 Biopsy on 07/05/19 showed fibrocystic changes in the left breast, no evidence of malignancy. Breast MRI on 08/11/19 showed a 1.7cm right nipple base mass, a 0.9cm superior central right breast mass, and indeterminate non-mass enhancement in the central left breast. Biopsy on 08/24/19 showed no evidence of malignancy in the left breast, and in the right breast, DCIS with necrosis, high grade, ER+ 100%, PR+ 70%.    09/20/2019 Surgery   Bilateral lumpectomies ([facility]): Left breast: complex sclerosing lesion with no evidence of malignancy Right breast: nipple adenoma with intraductal papilloma and DCIS, intermediate grade, 0.4cm, clear margins.   11/28/2019 -  Radiation Therapy   Adjuvant radiation     CHIEF COMPLIANT: Follow-up to discuss further treatment  INTERVAL HISTORY: [name] is a 79 y.o. with above-mentioned history of right breast DCIS treated with lumpectomy and who is currently on radiation therapy. She presents to the clinic today for follow-up to discuss further treatment.   ALLERGIES:  is allergic to codeine and hydrocodone-homatropine.   MEDICATIONS:  Current Outpatient Medications  Medication Sig Dispense Refill  . acetaminophen (TYLENOL) 500 MG tablet Take 1,000 mg by mouth every 6 (six) hours as needed.    Marland Kitchen FOLIC ACID PO Take 20 mg by mouth daily.     . hydrochlorothiazide (HYDRODIURIL) 25 MG tablet Take 1 tablet by mouth once daily (Patient taking differently: She is taking 12.5 mg daily due to fluid changes. She has permission from her doctor.) 90 tablet 1  . ibuprofen (ADVIL) 800 MG tablet Take 0.5 tablets (400 mg total) by mouth every 8 (eight) hours as needed. 30 tablet 0  . mometasone (ELOCON) 0.1 % ointment Apply topically 2 (two) times daily. 45 g 1   No current facility-administered medications for this visit.    PHYSICAL EXAMINATION: ECOG PERFORMANCE STATUS: 1 - Symptomatic but completely ambulatory  Vitals:   12/19/19 0933  BP: (!) 160/80  Pulse: 82  Resp: 17  Temp: 98.2 F (36.8 C)  SpO2: 97%   Filed Weights   12/19/19 0933  Weight: 214 lb (97.1 kg)    LABORATORY DATA:  I have reviewed the data as listed CMP Latest Ref Rng & Units 09/18/2019 02/09/2019 11/08/2018  Glucose 70 - 99 mg/dL 146(H) 112(H) 96  BUN 8 - 23 mg/dL 11 17 22   Creatinine 0.44 - 1.00 mg/dL 0.66 0.66 0.69  Sodium 135 - 145 mmol/L 140 140 139  Potassium 3.5 - 5.1 mmol/L 4.2 3.5 4.1  Chloride 98 - 111 mmol/L 104 102 103  CO2 22 - 32 mmol/L 26 31 27   Calcium 8.9 - 10.3 mg/dL 9.0 8.9 9.5  Total Protein 6.5 - 8.1 g/dL 7.1 7.0 7.5  Total Bilirubin 0.3 - 1.2 mg/dL 0.3 0.4 0.5  Alkaline Phos 38 - 126 U/L 74 74 77  AST 15 - 41 U/L 30 29 16   ALT 0 - 44 U/L 31 23 24     Lab Results  Component Value Date   WBC 10.6 (H) 09/18/2019   HGB 13.3 09/18/2019   HCT 40.4 09/18/2019   MCV 96.2 09/18/2019   PLT 227 09/18/2019   NEUTROABS 3.1 09/18/2019    ASSESSMENT & PLAN:  Ductal carcinoma in situ (DCIS) of right breast 09/10/2019:Bilateral lumpectomies ([facility]): Left breast: complex sclerosing lesion with no evidence of malignancy  Right breast: nipple adenoma with intraductal papilloma and DCIS, intermediate grade, 0.4cm, clear margins.  ER 100%, PR 70% Tis NX stage 0  Treatment plan: Adjuvant radiation 11/28/2019-12/19/2019 Given her age and comorbidities, patient opted for radiation alone. We once again discussed the pros and cons of adjuvant antiestrogen therapy. Because she has a prior history of superficial venous thrombosis, she made a decision not to receive antiestrogen therapy.  I set her up for mammogram in September I will see her back in 1 year for follow-up.   No orders of the defined types were placed in this encounter.  The patient has a good understanding of the overall plan. she agrees with it. she will call with any problems that may develop before the next visit here.  Total time spent: 20 mins including face to face time and time spent for planning, charting and coordination of care  Nicholas Lose, MD 12/19/2019  I, Cloyde Reams Dorshimer, am acting as scribe for Dr. Nicholas Lose.  I have reviewed the above documentation for accuracy and completeness, and I agree with the above.

## 2019-12-19 ENCOUNTER — Other Ambulatory Visit: Payer: Self-pay

## 2019-12-19 ENCOUNTER — Ambulatory Visit
Admission: RE | Admit: 2019-12-19 | Discharge: 2019-12-19 | Disposition: A | Discharge status: Home / Self Care | Payer: Medicare Other | Source: Ambulatory Visit | Attending: Radiation Oncology | Admitting: Radiation Oncology

## 2019-12-19 ENCOUNTER — Inpatient Hospital Stay: Payer: Medicare Other | Attending: Hematology and Oncology | Admitting: Hematology and Oncology

## 2019-12-19 DIAGNOSIS — Z86718 Personal history of other venous thrombosis and embolism: Secondary | ICD-10-CM | POA: Insufficient documentation

## 2019-12-19 DIAGNOSIS — D0511 Intraductal carcinoma in situ of right breast: Secondary | ICD-10-CM | POA: Insufficient documentation

## 2019-12-19 NOTE — Assessment & Plan Note (Signed)
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  ER 100%, PR 70% Tis NX stage 0  Treatment plan: Adjuvant radiation Given her age and comorbidities, patient opted for radiation alone. We once again discussed the pros and cons of adjuvant antiestrogen therapy.

## 2019-12-20 ENCOUNTER — Other Ambulatory Visit: Payer: Self-pay

## 2019-12-20 ENCOUNTER — Ambulatory Visit
Admission: RE | Admit: 2019-12-20 | Discharge: 2019-12-20 | Disposition: A | Discharge status: Home / Self Care | Payer: Medicare Other | Source: Ambulatory Visit | Attending: Radiation Oncology | Admitting: Radiation Oncology

## 2019-12-20 DIAGNOSIS — D0511 Intraductal carcinoma in situ of right breast: Secondary | ICD-10-CM | POA: Diagnosis not present

## 2019-12-21 ENCOUNTER — Other Ambulatory Visit: Payer: Self-pay

## 2019-12-21 ENCOUNTER — Ambulatory Visit
Admission: RE | Admit: 2019-12-21 | Discharge: 2019-12-21 | Disposition: A | Discharge status: Home / Self Care | Payer: Medicare Other | Source: Ambulatory Visit | Attending: Radiation Oncology | Admitting: Radiation Oncology

## 2019-12-21 ENCOUNTER — Telehealth: Payer: Self-pay | Admitting: Hematology and Oncology

## 2019-12-21 DIAGNOSIS — D0511 Intraductal carcinoma in situ of right breast: Secondary | ICD-10-CM | POA: Diagnosis not present

## 2019-12-21 NOTE — Telephone Encounter (Signed)
Scheduled per 03/16 los, patient has been called and voicemail was left.

## 2019-12-22 ENCOUNTER — Encounter: Payer: Self-pay | Admitting: *Deleted

## 2019-12-22 ENCOUNTER — Ambulatory Visit
Admission: RE | Admit: 2019-12-22 | Discharge: 2019-12-22 | Disposition: A | Discharge status: Home / Self Care | Payer: Medicare Other | Source: Ambulatory Visit | Attending: Radiation Oncology | Admitting: Radiation Oncology

## 2019-12-22 ENCOUNTER — Other Ambulatory Visit: Payer: Self-pay

## 2019-12-22 DIAGNOSIS — D0511 Intraductal carcinoma in situ of right breast: Secondary | ICD-10-CM | POA: Diagnosis not present

## 2019-12-25 ENCOUNTER — Telehealth: Payer: Self-pay

## 2019-12-25 NOTE — Telephone Encounter (Signed)
Pt left v/m that about 3 yrs ago she had a cologuard test and pt has gotten notice from cologuard that it is time for pt to have another cologuard test. Pt wants to know if Dr Diona Browner wants pt to have another cologuard test and if so when.Please advise.

## 2019-12-26 NOTE — Telephone Encounter (Signed)
Colon Cancer screening note typically recommended past age 81, but if she wants to consider, let her know we can proceed with cologuard (make sure she is aware there is a higher rate of false positives after age 18).  Alternately we can discuss further at her next wellness visit.

## 2019-12-26 NOTE — Telephone Encounter (Signed)
Mrs. Fiesta notified as instructed by telephone.  She will hold off screening for now.

## 2020-01-17 ENCOUNTER — Encounter: Payer: Self-pay | Admitting: Radiation Oncology

## 2020-01-23 NOTE — Progress Notes (Signed)
I called the patient today about her upcoming follow-up appointment in radiation oncology.   Given the state of the COVID-19 pandemic, concerning case numbers in our community, and guidance from [facility], I offered a phone assessment with the patient to determine if coming to the clinic was necessary. She accepted.  I let the patient know that I had spoken with Dr. Isidore Moos, and she wanted them to know the importance of washing their hands for at least 20 seconds at a time, especially after going out in public, and before they eat.  Limit going out in public whenever possible. Do not touch your face, unless your hands are clean, such as when bathing. Get plenty of rest, eat well, and stay hydrated. Patient verbalized understanding and agreement.  The patient denies any symptomatic concerns.  Specifically, they report good healing of their skin in the radiation fields.  Skin is intact.  She still reports fatigue but states it's manageable. Skin in treatment field is still slightly darker and mildly itchy, but she reports it is intact and in good condition otherwise. She also reports that nipple sensitivity is improving daily. She and her husband received both Pfizer COVID-19 vaccines last month, and tolerated them well.   I recommended that she continue skin care by applying oil or lotion with vitamin E to the skin in the radiation fields, BID, for 2 more months.  Continue follow-up with medical oncology - follow-up is scheduled in September with Dr. Nicholas Lose after her mammogram.  I explained that yearly mammograms are important for patients with intact breast tissue, and physical exams are important after mastectomy for patients that cannot undergo mammography. Patient verbalized understanding and agreement.  I encouraged her to call if she had further questions or concerns about her healing. Otherwise, she will follow-up PRN in radiation oncology. Patient is pleased with this plan, and we  will cancel her upcoming follow-up to reduce the risk of COVID-19 transmission.

## 2020-01-24 ENCOUNTER — Ambulatory Visit
Admission: RE | Admit: 2020-01-24 | Discharge: 2020-01-24 | Disposition: A | Discharge status: Home / Self Care | Payer: Medicare Other | Source: Ambulatory Visit | Attending: Radiation Oncology | Admitting: Radiation Oncology

## 2020-02-06 ENCOUNTER — Ambulatory Visit: Payer: Medicare Other | Admitting: Family Medicine

## 2020-02-06 ENCOUNTER — Other Ambulatory Visit: Payer: Self-pay

## 2020-02-06 ENCOUNTER — Encounter: Payer: Self-pay | Admitting: Family Medicine

## 2020-02-06 VITALS — BP 126/60 | HR 81 | Temp 98.2°F | Ht 63.0 in | Wt 211.5 lb

## 2020-02-06 DIAGNOSIS — I10 Essential (primary) hypertension: Secondary | ICD-10-CM

## 2020-02-06 DIAGNOSIS — R350 Frequency of micturition: Secondary | ICD-10-CM | POA: Diagnosis not present

## 2020-02-06 DIAGNOSIS — R42 Dizziness and giddiness: Secondary | ICD-10-CM | POA: Diagnosis not present

## 2020-02-06 DIAGNOSIS — N3941 Urge incontinence: Secondary | ICD-10-CM

## 2020-02-06 DIAGNOSIS — L03319 Cellulitis of trunk, unspecified: Secondary | ICD-10-CM | POA: Diagnosis not present

## 2020-02-06 LAB — POC URINALSYSI DIPSTICK (AUTOMATED)
Bilirubin, UA: NEGATIVE
Blood, UA: NEGATIVE
Glucose, UA: NEGATIVE
Ketones, UA: NEGATIVE
Leukocytes, UA: NEGATIVE
Nitrite, UA: NEGATIVE
Protein, UA: NEGATIVE
Spec Grav, UA: >=1.03 — AB (ref 1.010–1.025)
Urobilinogen, UA: 0.2 E.U./dL
pH, UA: 6 (ref 5.0–8.0)

## 2020-02-06 MED ORDER — CEPHALEXIN 500 MG PO CAPS
500.0000 mg | ORAL_CAPSULE | Freq: Three times a day (TID) | ORAL | 0 refills | Status: DC
Start: 2020-02-06 — End: 2020-03-29

## 2020-02-06 NOTE — Patient Instructions (Addendum)
Start home vertigo exercises.  Increase flonase to daily 2 sprays per nostril. Complete course of antibiotics.  We will call with urine culture results.

## 2020-02-06 NOTE — Progress Notes (Signed)
Chief Complaint  Patient presents with  . Urinary Incontinence  . Dizziness  . Skin Lesion    right-on scar from lumpectomy    History of Present Illness: HPI 79 year old female patient with history of urge incontinence, PSVT, ILD, HTN, RArecent treatment for right breast cancer treated with lumpectomy in 09/2019 presents with urinary incontinence , skin lesion and dizziness  1.urinary urge incontinence: Started on Myrbetriq 25mg   on 11/11/2018... was too expensive. She reports she has issues getting to the bathroom.. urgency. Worse in last few months.  Increase in frequency.  No  Blood in urine, no dysuria. No abd pain. Abnormal odor   2. Dizziness:  In past she has had dizziness felt to be due to allergies.Marland Kitchen treated with flonase  She has issues with leaning head back and rolling over in bed  In last 3 weeks.. this triggers  Room spinning vertigo.   3. On right chest was at site of lumpectomy she has noted  Redness, no pain, no discharge oat end of scar. No mass noted.    Improved blood pressure control in office today  Despite lower dose of HCTZ... no improvement in urinary symptoms. BP Readings from Last 3 Encounters:  02/06/20 126/60  12/19/19 (!) 160/80  10/05/19 (!) 150/94    History of MRSA positivity prior to a knee surgery years ago.   This visit occurred during the SARS-CoV-2 public health emergency.  Safety protocols were in place, including screening questions prior to the visit, additional usage of staff PPE, and extensive cleaning of exam room while observing appropriate contact time as indicated for disinfecting solutions.   COVID 19 screen:  No recent travel or known exposure to COVID19 The patient denies respiratory symptoms of COVID 19 at this time. The importance of social distancing was discussed today.     Review of Systems  Constitutional: Negative for chills and fever.  HENT: Negative for congestion and ear pain.   Eyes: Negative for pain and  redness.  Respiratory: Negative for cough and shortness of breath.   Cardiovascular: Negative for chest pain, palpitations and leg swelling.  Gastrointestinal: Negative for abdominal pain, blood in stool, constipation, diarrhea, nausea and vomiting.  Genitourinary: Negative for dysuria.  Musculoskeletal: Negative for falls and myalgias.  Skin: Negative for rash.  Neurological: Positive for dizziness.  Psychiatric/Behavioral: Negative for depression. The patient is not nervous/anxious.       Past Medical History:  Diagnosis Date  . Cancer [facility])    breast  . Cataract    diagnosed by Dr. Phineas Douglas  . Dysrhythmia    PSVT/ palpitations-  controlled with prn Inderal  . GERD (gastroesophageal reflux disease)    30 years ago.   Marland Kitchen Hyperlipidemia    statin intolerant  . Hypertension    eccho 4/13 EPIC  . Migraines   . Obesity   . Osteoarthritis   . Phlebitis of leg, right, superficial 1986/1989   x 2    reports that she quit smoking about 41 years ago. She has a 5.00 pack-year smoking history. She has never used smokeless tobacco. She reports that she does not drink alcohol or use drugs.   Current Outpatient Medications:  .  acetaminophen (TYLENOL) 500 MG tablet, Take 1,000 mg by mouth every 6 (six) hours as needed., Disp: , Rfl:  .  Adalimumab (HUMIRA PEN [place]), Inject 1 each into the skin every 14 (fourteen) days., Disp: , Rfl:  .  FOLIC ACID PO, Take 20 mg by mouth  daily. , Disp: , Rfl:  .  hydrochlorothiazide (HYDRODIURIL) 25 MG tablet, Take 12.5 mg by mouth daily., Disp: , Rfl:  .  ibuprofen (ADVIL) 800 MG tablet, Take 0.5 tablets (400 mg total) by mouth every 8 (eight) hours as needed., Disp: 30 tablet, Rfl: 0 .  mometasone (ELOCON) 0.1 % ointment, Apply topically 2 (two) times daily., Disp: 45 g, Rfl: 1   Observations/Objective: Blood pressure 126/60, pulse 81, temperature 98.2 F (36.8 C), temperature source Temporal, height 5\' 3"  (1.6 m), weight 211 lb 8 oz (95.9 kg), SpO2 93  %.  Physical Exam Constitutional:      General: She is not in acute distress.    Appearance: Normal appearance. She is well-developed. She is not ill-appearing or toxic-appearing.  HENT:     Head: Normocephalic.     Right Ear: Hearing, tympanic membrane, ear canal and external ear normal. Tympanic membrane is not erythematous, retracted or bulging.     Left Ear: Hearing, tympanic membrane, ear canal and external ear normal. Tympanic membrane is not erythematous, retracted or bulging.     Nose: No mucosal edema or rhinorrhea.     Right Sinus: No maxillary sinus tenderness or frontal sinus tenderness.     Left Sinus: No maxillary sinus tenderness or frontal sinus tenderness.     Mouth/Throat:     Pharynx: Uvula midline.  Eyes:     General: Lids are normal. Lids are everted, no foreign bodies appreciated.     Conjunctiva/sclera: Conjunctivae normal.     Pupils: Pupils are equal, round, and reactive to light.  Neck:     Thyroid: No thyroid mass or thyromegaly.     Vascular: No carotid bruit.     Trachea: Trachea normal.  Cardiovascular:     Rate and Rhythm: Normal rate and regular rhythm.     Pulses: Normal pulses.     Heart sounds: Normal heart sounds, S1 normal and S2 normal. No murmur heard.  No friction rub. No gallop.   Pulmonary:     Effort: Pulmonary effort is normal. No tachypnea or respiratory distress.     Breath sounds: Normal breath sounds. No decreased breath sounds, wheezing, rhonchi or rales.  Abdominal:     General: Bowel sounds are normal.     Palpations: Abdomen is soft.     Tenderness: There is no abdominal tenderness.  Musculoskeletal:     Cervical back: Normal range of motion and neck supple.  Skin:    General: Skin is warm and dry.     Findings: No rash.  Neurological:     Mental Status: She is alert.  Psychiatric:        Mood and Affect: Mood is not anxious or depressed.        Speech: Speech normal.        Behavior: Behavior normal. Behavior is  cooperative.        Thought Content: Thought content normal.        Judgment: Judgment normal.      Assessment and Plan  Urinary frequency  We will call with urine culture results.  Urge incontinence Rule out infection. May be bladder spasm. Keep up with water and avoid bladder irritants.  Dizziness Restart flonase. Start home vertigo exercises.  Essential hypertension, benign Well controlled. Continue current medication.      Eliezer Lofts, MD

## 2020-02-07 LAB — URINE CULTURE
MICRO NUMBER:: 10437223
SPECIMEN QUALITY:: ADEQUATE

## 2020-02-07 NOTE — Progress Notes (Signed)
  Patient Name: [name] MRN: [id number] DOB: 1940-12-13 Referring Physician: Eliezer Lofts (Profile Not Attached) Date of Service: 01/17/2020 [facility], Peter                                                        End Of Treatment Note  Diagnoses: DCIS, right breast  Cancer Staging: Stage 0 Right Breast DCIS, ER+ / PR+, Grade 2  Intent: curative  Radiation Treatment Dates: 11/27/2019 through 12/22/2019 Site Technique Total Dose (Gy) Dose per Fx (Gy) Completed Fx Beam Energies  Breast, Right: Breast_Rt 3D 40.05/40.05 2.67 15/15 10X  Breast, Right: Breast_Rt_Bst specialPort 10/10 2 5/5 15E   Narrative: The patient tolerated radiation therapy relatively well.   Plan: The patient will follow-up with radiation oncology in 1 mo. -----------------------------------  Eppie Gibson, MD

## 2020-02-21 ENCOUNTER — Ambulatory Visit: Payer: Medicare Other | Admitting: Family Medicine

## 2020-02-21 ENCOUNTER — Other Ambulatory Visit: Payer: Self-pay

## 2020-02-21 ENCOUNTER — Encounter: Payer: Self-pay | Admitting: Family Medicine

## 2020-02-21 VITALS — BP 130/70 | HR 78 | Temp 97.5°F | Ht 63.0 in | Wt 213.2 lb

## 2020-02-21 DIAGNOSIS — M65311 Trigger thumb, right thumb: Secondary | ICD-10-CM

## 2020-02-21 DIAGNOSIS — M65331 Trigger finger, right middle finger: Secondary | ICD-10-CM

## 2020-02-21 DIAGNOSIS — M65322 Trigger finger, left index finger: Secondary | ICD-10-CM | POA: Diagnosis not present

## 2020-02-21 MED ORDER — METHYLPREDNISOLONE ACETATE 40 MG/ML IJ SUSP
20.0000 mg | Freq: Once | INTRAMUSCULAR | Status: AC
  Administered 2020-02-21: 20 mg via INTRA_ARTICULAR

## 2020-02-21 NOTE — Progress Notes (Signed)
Spencer T. Copland, MD, [place] at [facility] [place], [address]  Phone: [phone]  FAX: [employer] - 79 y.o. female  MRN [id number]  Date of Birth: 04-13-1941  Date: 02/21/2020  PCP: [name], MD  Referral: [name], MD  Chief Complaint  Patient presents with  . Trigger Finger    Left Index, Rigth Middle & Thumb    This visit occurred during the SARS-CoV-2 public health emergency.  Safety protocols were in place, including screening questions prior to the visit, additional usage of staff PPE, and extensive cleaning of exam room while observing appropriate contact time as indicated for disinfecting solutions.    Injections X 3  She is here with 3 distinct trigger fingers.  She does have rheumatoid arthritis, and she has triggering at the left second digit, right third digit as well as the right first digit.  This is for procedural visit only.  Tendon Sheath Injection Procedure Note TAKISHA KUETHER 05/07/1941 Date of procedure: 02/21/2020  Procedure: Tendon Sheath Injection for Trigger Finger, R 1st Indications: Pain  Procedure Details Verbal consent was obtained. Risks (including rare risk of infection, potential risk for skin lightening and potential atrophy), benefits and alternatives were discussed. Prepped with Chloraprep and Ethyl Chloride used for anesthesia. Under sterile conditions, patient injected at palmar crease aiming distally with 45 degree angle towards nodule; injected directly into tendon sheath. Medication flowed freely without resistance.  Needle size: 22 gauge 1 1/2 inch Injection: 1/2 cc of Lidocaine 1% and Depo-Medrol 20 mg Medication: Depo-Medrol 20 mg   Tendon Sheath Injection Procedure Note TIAIRRA COONRADT May 20, 1941 Date of procedure: 02/21/2020  Procedure: Tendon Sheath Injection for Trigger  Finger, R 3rd Indications: Pain  Procedure Details Verbal consent was obtained. Risks (including rare risk of infection, potential risk for skin lightening and potential atrophy), benefits and alternatives were discussed. Prepped with Chloraprep and Ethyl Chloride used for anesthesia. Under sterile conditions, patient injected at palmar crease aiming distally with 45 degree angle towards nodule; injected directly into tendon sheath. Medication flowed freely without resistance.  Needle size: 22 gauge 1 1/2 inch Injection: 1/2 cc of Lidocaine 1% and Depo-Medrol 20 mg Medication: Depo-Medrol 20 mg   Tendon Sheath Injection Procedure Note JAZZLENE EDGINGTON [birth year] Date of procedure: 02/21/2020  Procedure: Tendon Sheath Injection for Trigger Finger, L 2nd Indications: Pain  Procedure Details Verbal consent was obtained. Risks (including rare risk of infection, potential risk for skin lightening and potential atrophy), benefits and alternatives were discussed. Prepped with Chloraprep and Ethyl Chloride used for anesthesia. Under sterile conditions, patient injected at palmar crease aiming distally with 45 degree angle towards nodule; injected directly into tendon sheath. Medication flowed freely without resistance.  Needle size: 22 gauge 1 1/2 inch Injection: 1/2 cc of Lidocaine 1% and Depo-Medrol 20 mg Medication: Depo-Medrol 20 mg     ICD-10-CM   1. Trigger finger, left index finger  M65.322 methylPREDNISolone acetate (DEPO-MEDROL) injection 20 mg  2. Trigger finger, right middle finger  M65.331 methylPREDNISolone acetate (DEPO-MEDROL) injection 20 mg  3. Trigger finger of right thumb  M65.311 methylPREDNISolone acetate (DEPO-MEDROL) injection 20 mg   Using an anatomical model, I reviewed with the patent the structures involved and how they related to their diagnosis .  We discussed the pathophysiology of trigger fingers. Discussed the inflammatory nature of nodule creation and likely  nodule  abutting the A1 pulley system, this causing the patient's discomfort and sensations. We discussed that treatments for this include direct injection into the tendon sheath to attempt to shrink catching tissue. This can be done 1-2 times. Other treatments include surgical release. If the patient fails to trigger finger injections, I would recommend trigger finger release if the patient desires relief of the symptoms.   Follow-up: No follow-ups on file.  Meds ordered this encounter  Medications  . methylPREDNISolone acetate (DEPO-MEDROL) injection 20 mg  . methylPREDNISolone acetate (DEPO-MEDROL) injection 20 mg  . methylPREDNISolone acetate (DEPO-MEDROL) injection 20 mg    Signed,  Spencer T. Copland, MD

## 2020-03-06 ENCOUNTER — Telehealth: Payer: Self-pay | Admitting: Family Medicine

## 2020-03-06 NOTE — Telephone Encounter (Signed)
Left message for patient to call back and schedule Medicare Annual Wellness Visit (AWV) with Nurse Health Advisor   This should be a telephone visit only.  Last AWV 11/04/17

## 2020-03-25 NOTE — Assessment & Plan Note (Signed)
We will call with urine culture results. ?

## 2020-03-25 NOTE — Assessment & Plan Note (Signed)
Well controlled. Continue current medication.  

## 2020-03-25 NOTE — Assessment & Plan Note (Signed)
Restart flonase. Start home vertigo exercises.

## 2020-03-25 NOTE — Assessment & Plan Note (Signed)
Rule out infection. May be bladder spasm. Keep up with water and avoid bladder irritants.

## 2020-03-28 NOTE — Progress Notes (Signed)
Virtual Visit via Video Note   This visit type was conducted due to national recommendations for restrictions regarding the COVID-19 Pandemic (e.g. social distancing) in an effort to limit this patient's exposure and mitigate transmission in our community.  Due to her co-morbid illnesses, this patient is at least at moderate risk for complications without adequate follow up.  This format is felt to be most appropriate for this patient at this time.  All issues noted in this document were discussed and addressed.  A limited physical exam was performed with this format.  Please refer to the patient's chart for her consent to telehealth for [facility].   Patient Location Home Physician Location Office   Date:  03/29/2020   ID:  Rachel, [name] 01/15/1941, MRN [id number]  PCP:  [name], MD  Cardiologist:   Jenkins Rouge, MD    History of Present Illness: [name] is a 79 y.o. female who presents for f/u regarding dyspnea and CAD. Last seen December 2020 to clear for breast surgery with Dr [name] which was done successfully on 09/20/19 for breast cancer     Seen by Dr Acie Fredrickson in 2013 for palpitations, HLD, HTN Chronically abnormal ECG with LVH and strain. TTE at that time was normal [facility] rheumatology Dr Amil Amen has been tried on multiple meds with side effects of weight gain and diarrhea currently on Enbrel   Seen By Dr Elsworth Soho June as CT chest suggested ILD. 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 Also with 4 mm RUL nodule  TTE from 03/10/18 And EF 49-20% grade on diastolic dysfunction mild MR/MAC normal estimated PA pressures. CT scan also showed calcification of the LM and LAD arteries   Myovue 09/15/19 normal with EF 61% no ischemia   Her birthday today Hopefully getting ice cream cake More dyspnea and having a hard time getting a hold Of pulmonary office Needs f/u with Chase Caller   Past Medical History:  Diagnosis Date  . Cancer [facility])      breast  . Cataract    diagnosed by Dr. Phineas Douglas  . Dysrhythmia    PSVT/ palpitations-  controlled with prn Inderal  . GERD (gastroesophageal reflux disease)    30 years ago.   Marland Kitchen Hyperlipidemia    statin intolerant  . Hypertension    eccho 4/13 EPIC  . Migraines   . Obesity   . Osteoarthritis   . Phlebitis of leg, right, superficial 1986/1989   x 2    Past Surgical History:  Procedure Laterality Date  . BREAST LUMPECTOMY WITH RADIOACTIVE SEED LOCALIZATION Bilateral 09/20/2019   Procedure: BILATERAL BREAST LUMPECTOMIES WITH BILATERAL RADIOACTIVE SEED LOCALIZATION AND RIGHT NIPPLE BIOPSY;  Surgeon: Erroll Luna, MD;  Location: [facility];  Service: General;  Laterality: Bilateral;  . CARDIAC CATHETERIZATION  1990s   negative  . CARDIOVASCULAR STRESS TEST  2008   NML  . CARDIOVASCULAR STRESS TEST  02/02/2007   EF 70%, NO ISCHEMIA  . DILATION AND CURETTAGE, DIAGNOSTIC / THERAPEUTIC  2008  . LESION DESTRUCTION  10/2017   Face; Dr. Ronnald Ramp  . ROTATOR CUFF REPAIR  1999   Right, left in 2002  . 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 US ECHOCARDIOGRAPHY  02/07/2007   EF 55-60%  Current Outpatient Medications  Medication Sig Dispense Refill  . Adalimumab (HUMIRA PEN [place]) Inject 40 mg into the skin every 14 (fourteen) days.     Marland Kitchen FOLIC ACID PO Take 20 mg by mouth daily.     . mometasone (ELOCON) 0.1 % ointment Apply topically 2 (two) times daily. 45 g 1   No current facility-administered medications for this visit.    Allergies:   Codeine and Hydrocodone-homatropine    Social History:  The patient  reports that she quit smoking about 42 years ago. She has a 5.00 pack-year smoking history. She has never used smokeless tobacco. She reports that she does not drink alcohol and does not use drugs.    Family History:  The patient's family history includes Coronary artery disease in an other family member; Heart attack (age of onset: 63) in her father; Heart defect in her sister; Hypertension in her father and mother; Lung cancer in her brother.    ROS:  Please see the history of present illness.   Otherwise, review of systems are positive for none.   All other systems are reviewed and negative.    PHYSICAL EXAM: VS:  There were no vitals taken for this visit. , BMI There is no height or weight on file to calculate BMI.  Phone interview no exam     EKG:  SR LVH with strain no change from 2013 09/11/19 SR rate 76 nonspecific ST changes    Recent Labs: 09/18/2019: ALT 31; BUN 11; Creatinine, Ser 0.66; Hemoglobin 13.3; Platelets 227; Potassium 4.2; Sodium 140    Lipid Panel    Component Value Date/Time   CHOL 210 (H) 11/08/2018 0810   TRIG 80.0 11/08/2018 0810   HDL 52.30 11/08/2018 0810   CHOLHDL 4 11/08/2018 0810   VLDL 16.0 11/08/2018 0810   LDLCALC 142 (H) 11/08/2018 0810   LDLDIRECT 151.5 08/17/2012 0901      Wt Readings from Last 3 Encounters:  02/21/20 213 lb 4 oz (96.7 kg)  02/06/20 211 lb 8 oz (95.9 kg)  12/19/19 214 lb (97.1 kg)      Other studies Reviewed: Additional studies/ records that were reviewed today include: Notes Dr Acie Fredrickson 2013 TTE Notes primary and pulmonary Dr Elsworth Soho CT chest and recent TTE June 2019.    ASSESSMENT AND PLAN:  1.  Dyspnea:  Non cardiac with normal EF , no significant valve disease and normal estimated PA pressure on TTE June 2019 Has ILD by CT 03/10/18 f/u pulmonary She has had a hard time getting in touch with pulmonary I have ordered f/u chest CT since she has had XRT not forwarded to them   2. CAD:  Seen on CT part of UIP/ILD  No chest pain normal myovue 09/15/19 observe   3. RA:  Continue Humira f/u Dr Amil Amen   4. HLD discussed starting statin given CAD on CT and LDL of 137    5. DM:  Discussed low carb diet.  Target  hemoglobin A1c is 6.5 or less.  Continue current medications.  6.  HTN  Well controlled.  Continue current medications and low sodium Dash type diet.    7. Abnormal ECG:  Chronic dating back to 2013 LVH with strain pattern although not seen on TTE ECG with non specific ST changes today    8. Breast Cancer:  Post bilateral  breast lumpectomy 67/59/16 no complications f/u with oncology She appears to be getting adjuvant XRT    Current medicines are reviewed at length  with the patient today.  The patient does not have concerns regarding medicines.  The following changes have been made:  None   Labs/ tests ordered today include: None   No orders of the defined types were placed in this encounter.    Disposition:   FU with cardiology in a year     Signed, Jenkins Rouge, MD  03/29/2020 8:01 AM    [address], [place], [place]  [address] Phone: [phone]; Fax: [phone]

## 2020-03-29 ENCOUNTER — Telehealth (INDEPENDENT_AMBULATORY_CARE_PROVIDER_SITE_OTHER): Payer: Medicare Other | Admitting: Cardiovascular Disease

## 2020-03-29 ENCOUNTER — Other Ambulatory Visit: Payer: Self-pay

## 2020-03-29 ENCOUNTER — Telehealth: Payer: Self-pay | Admitting: *Deleted

## 2020-03-29 VITALS — Ht 65.0 in | Wt 200.0 lb

## 2020-03-29 DIAGNOSIS — R0609 Other forms of dyspnea: Secondary | ICD-10-CM

## 2020-03-29 DIAGNOSIS — R0600 Dyspnea, unspecified: Secondary | ICD-10-CM | POA: Diagnosis not present

## 2020-03-29 DIAGNOSIS — J849 Interstitial pulmonary disease, unspecified: Secondary | ICD-10-CM

## 2020-03-29 NOTE — Telephone Encounter (Signed)
  Patient Consent for Virtual Visit         [name] has provided verbal consent on 03/29/2020 for a virtual visit (video or telephone).   CONSENT FOR VIRTUAL VISIT FOR:  [name]  By participating in this virtual visit I agree to the following:  I hereby voluntarily request, consent and authorize [facility] and its employed or contracted physicians, physician assistants, nurse practitioners or other licensed health care professionals (the Practitioner), to provide me with telemedicine health care services (the "Services") as deemed necessary by the treating Practitioner. I acknowledge and consent to receive the Services by the Practitioner via telemedicine. I understand that the telemedicine visit will involve communicating with the Practitioner through live audiovisual communication technology and the disclosure of certain medical information by electronic transmission. I acknowledge that I have been given the opportunity to request an in-person assessment or other available alternative prior to the telemedicine visit and am voluntarily participating in the telemedicine visit.  I understand that I have the right to withhold or withdraw my consent to the use of telemedicine in the course of my care at any time, without affecting my right to future care or treatment, and that the Practitioner or I may terminate the telemedicine visit at any time. I understand that I have the right to inspect all information obtained and/or recorded in the course of the telemedicine visit and may receive copies of available information for a reasonable fee.  I understand that some of the potential risks of receiving the Services via telemedicine include:  Marland Kitchen Delay or interruption in medical evaluation due to technological equipment failure or disruption; . Information transmitted may not be sufficient (e.g. poor resolution of images) to allow for appropriate medical decision making by the  Practitioner; and/or  . In rare instances, security protocols could fail, causing a breach of personal health information.  Furthermore, I acknowledge that it is my responsibility to provide information about my medical history, conditions and care that is complete and accurate to the best of my ability. I acknowledge that Practitioner's advice, recommendations, and/or decision may be based on factors not within their control, such as incomplete or inaccurate data provided by me or distortions of diagnostic images or specimens that may result from electronic transmissions. I understand that the practice of medicine is not an exact science and that Practitioner makes no warranties or guarantees regarding treatment outcomes. I acknowledge that a copy of this consent can be made available to me via my patient portal ([facility]), or I can request a printed copy by calling the office of [facility].    I understand that my insurance will be billed for this visit.   I have read or had this consent read to me. . I understand the contents of this consent, which adequately explains the benefits and risks of the Services being provided via telemedicine.  . I have been provided ample opportunity to ask questions regarding this consent and the Services and have had my questions answered to my satisfaction. . I give my informed consent for the services to be provided through the use of telemedicine in my medical care

## 2020-03-29 NOTE — Patient Instructions (Signed)
Medication Instructions:  Your physician recommends that you continue on your current medications as directed. Please refer to the Current Medication list given to you today.  *If you need a refill on your cardiac medications before your next appointment, please call your pharmacy*   Lab Work: none If you have labs (blood work) drawn today and your tests are completely normal, you will receive your results only by: Marland Kitchen MyChart Message (if you have MyChart) OR . A paper copy in the mail If you have any lab test that is abnormal or we need to change your treatment, we will call you to review the results.   Testing/Procedures: Non-Cardiac CT scanning, (CAT scanning), is a noninvasive, special x-ray that produces cross-sectional images of the body using x-rays and a computer. CT scans help physicians diagnose and treat medical conditions. For some CT exams, a contrast material is used to enhance visibility in the area of the body being studied. CT scans provide greater clarity and reveal more details than regular x-ray exams.    Follow-Up: At [facility], you and your health needs are our priority.  As part of our continuing mission to provide you with exceptional heart care, we have created designated Provider Care Teams.  These Care Teams include your primary Cardiologist (physician) and Advanced Practice Providers (APPs -  Physician Assistants and Nurse Practitioners) who all work together to provide you with the care you need, when you need it.  We recommend signing up for the patient portal called "MyChart".  Sign up information is provided on this After Visit Summary.  MyChart is used to connect with patients for Virtual Visits (Telemedicine).  Patients are able to view lab/test results, encounter notes, upcoming appointments, etc.  Non-urgent messages can be sent to your provider as well.   To learn more about what you can do with MyChart, go to [email or web address].    Your next  appointment:   6 month(s)  The format for your next appointment:   In Person  Provider:   You may see Jenkins Rouge, MD or one of the following Advanced Practice Providers on your designated Care Team:    Truitt Merle, NP  Cecilie Kicks, NP  Kathyrn Drown, NP    Other Instructions  A referral has been sent to [facility] office

## 2020-03-29 NOTE — Addendum Note (Signed)
Addended by: Thompson Grayer on: 03/29/2020 09:32 AM   Modules accepted: Orders

## 2020-04-01 ENCOUNTER — Encounter (HOSPITAL_COMMUNITY): Payer: Self-pay | Admitting: Internal Medicine

## 2020-04-01 ENCOUNTER — Other Ambulatory Visit: Payer: Self-pay

## 2020-04-01 ENCOUNTER — Inpatient Hospital Stay (HOSPITAL_BASED_OUTPATIENT_CLINIC_OR_DEPARTMENT_OTHER): Payer: Medicare Other

## 2020-04-01 ENCOUNTER — Ambulatory Visit: Payer: Medicare Other | Admitting: Family Medicine

## 2020-04-01 ENCOUNTER — Emergency Department (HOSPITAL_COMMUNITY): Payer: Medicare Other

## 2020-04-01 ENCOUNTER — Telehealth: Payer: Self-pay | Admitting: Family Medicine

## 2020-04-01 ENCOUNTER — Encounter: Payer: Self-pay | Admitting: Family Medicine

## 2020-04-01 ENCOUNTER — Observation Stay (HOSPITAL_COMMUNITY)
Admission: EM | Admit: 2020-04-01 | Discharge: 2020-04-02 | Disposition: A | Discharge status: Home / Self Care | Payer: Medicare Other | Attending: Internal Medicine | Admitting: Internal Medicine

## 2020-04-01 VITALS — BP 160/80 | HR 94 | Temp 98.1°F | Ht 63.0 in | Wt 209.5 lb

## 2020-04-01 DIAGNOSIS — I361 Nonrheumatic tricuspid (valve) insufficiency: Secondary | ICD-10-CM

## 2020-04-01 DIAGNOSIS — Z20822 Contact with and (suspected) exposure to covid-19: Secondary | ICD-10-CM | POA: Diagnosis not present

## 2020-04-01 DIAGNOSIS — Z87891 Personal history of nicotine dependence: Secondary | ICD-10-CM | POA: Insufficient documentation

## 2020-04-01 DIAGNOSIS — E785 Hyperlipidemia, unspecified: Secondary | ICD-10-CM | POA: Diagnosis not present

## 2020-04-01 DIAGNOSIS — J849 Interstitial pulmonary disease, unspecified: Secondary | ICD-10-CM

## 2020-04-01 DIAGNOSIS — R7303 Prediabetes: Secondary | ICD-10-CM | POA: Diagnosis not present

## 2020-04-01 DIAGNOSIS — R0609 Other forms of dyspnea: Secondary | ICD-10-CM

## 2020-04-01 DIAGNOSIS — Z6836 Body mass index (BMI) 36.0-36.9, adult: Secondary | ICD-10-CM | POA: Diagnosis not present

## 2020-04-01 DIAGNOSIS — I10 Essential (primary) hypertension: Secondary | ICD-10-CM | POA: Diagnosis not present

## 2020-04-01 DIAGNOSIS — I2699 Other pulmonary embolism without acute cor pulmonale: Principal | ICD-10-CM | POA: Insufficient documentation

## 2020-04-01 DIAGNOSIS — Z885 Allergy status to narcotic agent status: Secondary | ICD-10-CM | POA: Diagnosis not present

## 2020-04-01 DIAGNOSIS — Z79899 Other long term (current) drug therapy: Secondary | ICD-10-CM | POA: Insufficient documentation

## 2020-04-01 DIAGNOSIS — M1990 Unspecified osteoarthritis, unspecified site: Secondary | ICD-10-CM | POA: Diagnosis not present

## 2020-04-01 DIAGNOSIS — I340 Nonrheumatic mitral (valve) insufficiency: Secondary | ICD-10-CM | POA: Diagnosis not present

## 2020-04-01 DIAGNOSIS — R0603 Acute respiratory distress: Secondary | ICD-10-CM | POA: Diagnosis not present

## 2020-04-01 DIAGNOSIS — M069 Rheumatoid arthritis, unspecified: Secondary | ICD-10-CM | POA: Diagnosis not present

## 2020-04-01 DIAGNOSIS — Z7951 Long term (current) use of inhaled steroids: Secondary | ICD-10-CM | POA: Diagnosis not present

## 2020-04-01 DIAGNOSIS — Z86000 Personal history of in-situ neoplasm of breast: Secondary | ICD-10-CM | POA: Insufficient documentation

## 2020-04-01 DIAGNOSIS — Z96653 Presence of artificial knee joint, bilateral: Secondary | ICD-10-CM | POA: Diagnosis not present

## 2020-04-01 DIAGNOSIS — D0511 Intraductal carcinoma in situ of right breast: Secondary | ICD-10-CM | POA: Diagnosis present

## 2020-04-01 DIAGNOSIS — R0600 Dyspnea, unspecified: Secondary | ICD-10-CM

## 2020-04-01 DIAGNOSIS — Z86711 Personal history of pulmonary embolism: Secondary | ICD-10-CM | POA: Diagnosis present

## 2020-04-01 DIAGNOSIS — R0902 Hypoxemia: Secondary | ICD-10-CM

## 2020-04-01 DIAGNOSIS — R0602 Shortness of breath: Secondary | ICD-10-CM | POA: Diagnosis present

## 2020-04-01 LAB — SARS CORONAVIRUS 2 BY RT PCR (HOSPITAL ORDER, PERFORMED IN ~~LOC~~ HOSPITAL LAB): SARS Coronavirus 2: NEGATIVE

## 2020-04-01 LAB — CBC WITH DIFFERENTIAL/PLATELET
Abs Immature Granulocytes: 0.02 10*3/uL (ref 0.00–0.07)
Basophils Absolute: 0 10*3/uL (ref 0.0–0.1)
Basophils Relative: 0 %
Eosinophils Absolute: 0.1 10*3/uL (ref 0.0–0.5)
Eosinophils Relative: 2 %
HCT: 42.8 % (ref 36.0–46.0)
Hemoglobin: 13.9 g/dL (ref 12.0–15.0)
Immature Granulocytes: 0 %
Lymphocytes Relative: 35 %
Lymphs Abs: 3.2 10*3/uL (ref 0.7–4.0)
MCH: 31.6 pg (ref 26.0–34.0)
MCHC: 32.5 g/dL (ref 30.0–36.0)
MCV: 97.3 fL (ref 80.0–100.0)
Monocytes Absolute: 0.7 10*3/uL (ref 0.1–1.0)
Monocytes Relative: 8 %
Neutro Abs: 4.9 10*3/uL (ref 1.7–7.7)
Neutrophils Relative %: 55 %
Platelets: 180 10*3/uL (ref 150–400)
RBC: 4.4 MIL/uL (ref 3.87–5.11)
RDW: 13.8 % (ref 11.5–15.5)
WBC: 9.1 10*3/uL (ref 4.0–10.5)
nRBC: 0 % (ref 0.0–0.2)

## 2020-04-01 LAB — TROPONIN I (HIGH SENSITIVITY)
Troponin I (High Sensitivity): 34 ng/L — ABNORMAL HIGH (ref <18)
Troponin I (High Sensitivity): 32 ng/L — ABNORMAL HIGH (ref <18)

## 2020-04-01 LAB — COMPREHENSIVE METABOLIC PANEL
ALT: 28 U/L (ref 0–44)
AST: 28 U/L (ref 15–41)
Albumin: 3.7 g/dL (ref 3.5–5.0)
Alkaline Phosphatase: 79 U/L (ref 38–126)
Anion gap: 11 (ref 5–15)
BUN: 7 mg/dL — ABNORMAL LOW (ref 8–23)
CO2: 24 mmol/L (ref 22–32)
Calcium: 8.9 mg/dL (ref 8.9–10.3)
Chloride: 106 mmol/L (ref 98–111)
Creatinine, Ser: 0.69 mg/dL (ref 0.44–1.00)
GFR calc Af Amer: >60 mL/min (ref >60)
GFR calc non Af Amer: >60 mL/min (ref >60)
Glucose, Bld: 102 mg/dL — ABNORMAL HIGH (ref 70–99)
Potassium: 3.9 mmol/L (ref 3.5–5.1)
Sodium: 141 mmol/L (ref 135–145)
Total Bilirubin: 0.5 mg/dL (ref 0.3–1.2)
Total Protein: 7.9 g/dL (ref 6.5–8.1)

## 2020-04-01 LAB — HEPARIN LEVEL (UNFRACTIONATED): Heparin Unfractionated: 0.24 IU/mL — ABNORMAL LOW (ref 0.30–0.70)

## 2020-04-01 LAB — D-DIMER, QUANTITATIVE: D-Dimer, Quant: 13.46 ug/mL-FEU — ABNORMAL HIGH (ref 0.00–0.50)

## 2020-04-01 LAB — BRAIN NATRIURETIC PEPTIDE: B Natriuretic Peptide: 47 pg/mL (ref 0.0–100.0)

## 2020-04-01 LAB — ECHOCARDIOGRAM COMPLETE
Height: 64 in
Weight: 3352 oz

## 2020-04-01 MED ORDER — ONDANSETRON HCL 4 MG PO TABS: 4.0000 mg | ORAL_TABLET | Freq: Four times a day (QID) | ORAL | Status: DC | PRN

## 2020-04-01 MED ORDER — ONDANSETRON HCL 4 MG/2ML IJ SOLN: 4.0000 mg | Freq: Four times a day (QID) | INTRAMUSCULAR | Status: DC | PRN

## 2020-04-01 MED ORDER — ACETAMINOPHEN 500 MG PO TABS
1000.0000 mg | ORAL_TABLET | Freq: Three times a day (TID) | ORAL | Status: DC
  Administered 2020-04-01 – 2020-04-02 (×4): 1000 mg via ORAL
  Filled 2020-04-01 (×4): qty 2

## 2020-04-01 MED ORDER — POLYETHYLENE GLYCOL 3350 17 G PO PACK: 17.0000 g | PACK | Freq: Every day | ORAL | Status: DC | PRN

## 2020-04-01 MED ORDER — AMLODIPINE BESYLATE 5 MG PO TABS
5.0000 mg | ORAL_TABLET | Freq: Every day | ORAL | Status: DC
  Administered 2020-04-01 – 2020-04-02 (×2): 5 mg via ORAL
  Filled 2020-04-01 (×2): qty 1

## 2020-04-01 MED ORDER — HEPARIN BOLUS VIA INFUSION
2000.0000 [IU] | Freq: Once | INTRAVENOUS | Status: AC
  Administered 2020-04-01: 2000 [IU] via INTRAVENOUS
  Filled 2020-04-01: qty 2000

## 2020-04-01 MED ORDER — HEPARIN (PORCINE) 25000 UT/250ML-% IV SOLN
1350.0000 [IU]/h | INTRAVENOUS | Status: AC
  Administered 2020-04-01: 1200 [IU]/h via INTRAVENOUS
  Administered 2020-04-02: 1400 [IU]/h via INTRAVENOUS
  Filled 2020-04-01 (×2): qty 250

## 2020-04-01 MED ORDER — SODIUM CHLORIDE 0.9% FLUSH
3.0000 mL | Freq: Two times a day (BID) | INTRAVENOUS | Status: DC
  Administered 2020-04-01 – 2020-04-02 (×2): 3 mL via INTRAVENOUS

## 2020-04-01 MED ORDER — MOMETASONE FUROATE 0.1 % EX CREA
1.0000 "application " | TOPICAL_CREAM | Freq: Two times a day (BID) | CUTANEOUS | Status: DC
  Administered 2020-04-01 – 2020-04-02 (×2): 1 via TOPICAL
  Filled 2020-04-01 (×2): qty 15

## 2020-04-01 MED ORDER — IOHEXOL 350 MG/ML SOLN
100.0000 mL | Freq: Once | INTRAVENOUS | Status: AC | PRN
  Administered 2020-04-01: 100 mL via INTRAVENOUS

## 2020-04-01 MED ORDER — FOLIC ACID 1 MG PO TABS
1.0000 mg | ORAL_TABLET | Freq: Every day | ORAL | Status: DC
  Administered 2020-04-01 – 2020-04-02 (×2): 1 mg via ORAL
  Filled 2020-04-01 (×2): qty 1

## 2020-04-01 MED ORDER — HYDRALAZINE HCL 20 MG/ML IJ SOLN: 5.0000 mg | INTRAMUSCULAR | Status: DC | PRN

## 2020-04-01 MED ORDER — HEPARIN BOLUS VIA INFUSION
5200.0000 [IU] | Freq: Once | INTRAVENOUS | Status: AC
  Administered 2020-04-01: 5200 [IU] via INTRAVENOUS
  Filled 2020-04-01: qty 5200

## 2020-04-01 MED ORDER — DOCUSATE SODIUM 100 MG PO CAPS
100.0000 mg | ORAL_CAPSULE | Freq: Two times a day (BID) | ORAL | Status: DC
  Administered 2020-04-01 – 2020-04-02 (×2): 100 mg via ORAL
  Filled 2020-04-01 (×3): qty 1

## 2020-04-01 MED ORDER — ZOLPIDEM TARTRATE 5 MG PO TABS: 5.0000 mg | ORAL_TABLET | Freq: Every evening | ORAL | Status: DC | PRN

## 2020-04-01 MED ORDER — MORPHINE SULFATE (PF) 2 MG/ML IV SOLN: 2.0000 mg | INTRAVENOUS | Status: DC | PRN

## 2020-04-01 MED ORDER — BISACODYL 5 MG PO TBEC: 5.0000 mg | DELAYED_RELEASE_TABLET | Freq: Every day | ORAL | Status: DC | PRN

## 2020-04-01 NOTE — Progress Notes (Signed)
[place] for Heparin Indication: pulmonary embolus  Allergies  Allergen Reactions  . Codeine     REACTION: Nausea and vomiting  . Hydrocodone-Homatropine Other (See Comments)    hallucinations    Patient Measurements: Height: 5\' 4"  (162.6 cm) IBW/kg (Calculated) : 54.7 Heparin Dosing Weight: 74.4 kg  Vital Signs: Temp: 98.4 F (36.9 C) (06/28 2012) Temp Source: Oral (06/28 2012) BP: 162/80 (06/28 2042) Pulse Rate: 80 (06/28 2042)  Labs: Recent Labs    04/01/20 1204 04/01/20 1404 04/01/20 2045  HGB 13.9  --   --   HCT 42.8  --   --   PLT 180  --   --   HEPARINUNFRC  --   --  0.24*  CREATININE 0.69  --   --   TROPONINIHS 34* 32*  --     Estimated Creatinine Clearance: 61.6 mL/min (by C-G formula based on SCr of 0.69 mg/dL).   Assessment: Patient is a 89 yof that presented to the ED with SOB. The was found to have a PE on further evaluation. Pharmacy has been asked to dose heparin at this time for her PE.   Initial heparin level 0.24  Goal of Therapy:  Heparin level 0.3-0.7 units/ml Monitor platelets by anticoagulation protocol: Yes   Plan:  - Heparin bolus 2000 units IV x 1 dose - Increase heparin to 1400 units / hr - Heparin level in ~ 6 hours  - Monitor patient for s/s of bleeding and CBC while on heparin   Thank you Anette Guarneri, PharmD 04/01/2020,9:34 PM

## 2020-04-01 NOTE — Progress Notes (Signed)
  Echocardiogram 2D Echocardiogram has been performed.  [name] 04/01/2020, 5:51 PM

## 2020-04-01 NOTE — Telephone Encounter (Signed)
Pt c/o SOB which she attributes to a side effect of Humira. The SOB occurs 2-4 days after the injection and has occurred after the last three inj. SOB has worsened after the last 3 inj. She has contacted her rheumatologist and they said SOB was not a side effect of Humira and did not advise to stop the inj. Pt contacted cardiology and they do not think it is heart related and could only make her an apt for the end of July. Pt denies any facial or tongue swelling, no difficulty swallowing and denies and other symptoms. Pt is requesting to be seen today to have an EKG if if needed. She is happy to see Dr. Lorelei Pont and voiced she has seen him in the past. Pt was on the way to the office when pt was contacted. Advised pt if any symptoms changed to contact office before coming in. Pt verbalized understanding.

## 2020-04-01 NOTE — Progress Notes (Addendum)
Rachel Barclift T. Eliza Grissinger, MD, [place]  [facility] and [place] at [facility] [place], [address]  Phone: [phone]  FAX: [place] - 79 y.o. female  MRN [id number]  Date of Birth: 06/28/1941  Date: 04/01/2020  PCP: [name], MD  Referral: [name], MD  Chief Complaint  Patient presents with  . Difficulty Walking    for about amonth, started getting worse last night    This visit occurred during the SARS-CoV-2 public health emergency.  Safety protocols were in place, including screening questions prior to the visit, additional usage of staff PPE, and extensive cleaning of exam room while observing appropriate contact time as indicated for disinfecting solutions.   Subjective:   [name] is a 79 y.o. very pleasant female patient with Body mass index is 37.11 kg/m. who presents with the following:  She is a patient with interstitial lung disease as well as rheumatoid arthritis on Humira.  Baseline oxygen level on record review is approximately 97%.  She has obvious labored breathing in the examination room.  She does not have a fever.  On her evaluation by pulmonary in May 2019 and she saw Dr. Chase Caller, director of the Interstitial lung Disease clinic at [facility] it was noted that she did not desaturate after walking.  In our office today, she was obviously short of breath after entering the examination room and had a pulse ox as low as 82%  In February June 2019, the patient was diagnosed with interstitial lung disease.  On record review, this high-resolution CT done at this time noted that the patient has suggestive findings of interstitial lung disease, possible UIP.  She also has a right upper lobe pulmonary nodule.  Initially for RA treatment, the patient was on methotrexate, and then was on leflucomide.  Also has been on Enbrel, and she is now on Humira.  At  that time it appears that a repeat high-resolution CT was recommended in 12 months.  She is also seen pulmonology.  She saw Dr. Chase Caller in 2019, but it appears she was lost to follow-up ILD known:  82-86%  [place]  86-92%  Last OV's up to 97%  Patient Active Problem List   Diagnosis Date Noted  . Urinary frequency 09/07/2019  . 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 Peripheral edema 02/09/2019  . Chronic fatigue 11/24/2018  . ILD (interstitial lung disease) ([place]) 11/24/2018  . Pulmonary nodule 11/24/2018  . Urge incontinence 11/11/2017  . Chronic diarrhea 05/11/2017  . Rheumatoid arthritis ([place]) 01/24/2016  . Counseling regarding end of life decision making 09/11/2015  . Chronic low back pain 09/11/2015  . Severe obesity with body mass index (BMI) of 36.0 to 36.9 with serious comorbidity ([place]) 10/12/2012  . 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/12/2012  . PSVT (paroxysmal supraventricular tachycardia) ([place]) 02/15/2012  . Dizziness 06/13/2008  . Prediabetes 01/10/2008  . Hyperlipidemia 01/04/2008  . Essential hypertension, benign 01/04/2008  . GERD 01/04/2008  . OSTEOARTHRITIS 01/04/2008  . SUPERFICIAL THROMBOPHLEBITIS 10/05/1988    Past Medical History:  Diagnosis Date  . Cancer [facility])    breast  . Cataract    diagnosed by Dr. Phineas Douglas  . Dysrhythmia    PSVT/ palpitations-  controlled with prn Inderal  . GERD (gastroesophageal reflux disease)    30 years ago.   Marland Kitchen Hyperlipidemia    statin intolerant  . Hypertension  eccho 4/13 EPIC  . Migraines   . Obesity   . Osteoarthritis   . Phlebitis of leg, right, superficial 1986/1989   x 2    Past Surgical History:  Procedure Laterality Date  . BREAST LUMPECTOMY WITH RADIOACTIVE SEED LOCALIZATION Bilateral 09/20/2019   Procedure: BILATERAL BREAST LUMPECTOMIES WITH BILATERAL RADIOACTIVE SEED LOCALIZATION AND RIGHT NIPPLE BIOPSY;  Surgeon: Erroll Luna, MD;  Location: [facility];  Service:  General;  Laterality: Bilateral;  . CARDIAC CATHETERIZATION  1990s   negative  . CARDIOVASCULAR STRESS TEST  2008   NML  . CARDIOVASCULAR STRESS TEST  02/02/2007   EF 70%, NO ISCHEMIA  . DILATION AND CURETTAGE, DIAGNOSTIC / THERAPEUTIC  2008  . LESION DESTRUCTION  10/2017   Face; Dr. Ronnald Ramp  . ROTATOR CUFF REPAIR  1999   Right, left in 2002  . 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 US ECHOCARDIOGRAPHY  02/07/2007   EF 55-60%    Family History  Problem Relation Age of Onset  . Heart attack Father 77       PUD  . Hypertension Father   . Lung cancer Brother   . Heart defect Sister   . Hypertension Mother   . Coronary artery disease Other        Female 1st degree relative <50     Review of Systems is noted in the HPI, as appropriate  Objective:   BP (!) 160/80   Pulse 94   Temp 98.1 F (36.7 C) (Skin)   Ht _0  (1.6 m)   Wt 209 lb 8 oz (95 kg)   SpO2 (!) 86%   BMI 37.11 kg/m   RR is approx 30 on exam  GEN: No acute distress; alert,appropriate. PULM: Increased work of breathing, increased respiratory rate, mild wheezing.  Intercostal retractions  CV: RRR, no m/g/r  PSYCH: Normally interactive.   Laboratory and Imaging Data: CLINICAL DATA:  79 year old female with wheezing and shortness of breath. Abnormal chest x-ray. Evaluate for interstitial lung disease.  EXAM: CT CHEST WITHOUT CONTRAST  TECHNIQUE: Multidetector CT imaging of the chest was performed following the standard protocol without intravenous contrast. High resolution imaging of the lungs, as well as inspiratory and expiratory imaging, was performed.  COMPARISON:  No priors.  FINDINGS: Cardiovascular: Heart size is normal. There is no significant pericardial fluid, thickening or pericardial calcification. There is aortic  atherosclerosis, as well as atherosclerosis of the great vessels of the mediastinum and the coronary arteries, including calcified atherosclerotic plaque in the left main and left anterior descending coronary arteries.  Mediastinum/Nodes: No pathologically enlarged mediastinal or hilar lymph nodes. Please note that accurate exclusion of hilar adenopathy is limited on noncontrast CT scans. Esophagus is unremarkable in appearance. No axillary lymphadenopathy.  Lungs/Pleura: High-resolution images demonstrate minimal peripheral predominant septal thickening, most evident in the lung bases. There is also a small amount of peripheral bronchiolectasis, best identified in the posterior aspect of the right lower lobe on axial image 15 of series 9. No significant regions of ground-glass attenuation, traction bronchiectasis or honeycombing. Inspiratory and expiratory imaging demonstrates minimal air trapping also most evident the lung bases, indicative of mild small airways disease. No acute consolidative airspace disease. No pleural effusions. 4 mm right upper lobe nodule abutting the minor fissure (axial image 62 of  series 3). No other larger more suspicious appearing pulmonary nodules or masses are noted.  Upper Abdomen: Mild diffuse low attenuation throughout the visualized portions of the hepatic parenchyma, compatible with hepatic steatosis. Aortic atherosclerosis.  Musculoskeletal: There are no aggressive appearing lytic or blastic lesions noted in the visualized portions of the skeleton.  IMPRESSION: 1. Findings do suggest interstitial lung disease, but at this time, the CT pattern is considered indeterminate for usual interstitial pneumonia (UIP). Repeat high-resolution chest CT is suggested in 12 months to assess for temporal changes in the appearance of the lung parenchyma. 2. 4 mm right upper lobe pulmonary nodule. This is nonspecific but statistically likely benign.  Attention at time of repeat high-resolution chest CT is recommended. 3. Aortic atherosclerosis, in addition to left main and left anterior descending coronary artery disease. Assessment for potential risk factor modification, dietary therapy or pharmacologic therapy may be warranted, if clinically indicated. 4. Hepatic steatosis.  Aortic Atherosclerosis (ICD10-I70.0).   Electronically Signed   By: Vinnie Langton M.D.   On: 03/10/2018 12:02  Lab Review:  CBC EXTENDED Latest Ref Rng & Units 09/18/2019 02/09/2019 11/24/2018  WBC 4.0 - 10.5 K/uL 10.6(H) 9.3 9.2  RBC 3.87 - 5.11 MIL/uL 4.20 4.09 4.31  HGB 12.0 - 15.0 g/dL 13.3 12.9 13.5  HCT 36 - 46 % 40.4 38.4 41.2  PLT 150 - 400 K/uL 227 198.0 209.0  NEUTROABS 1.7 - 7.7 K/uL 3.1 3.3 4.0  LYMPHSABS 0.7 - 4.0 K/uL 6.5(H) 4.7(H) 4.0    BMP Latest Ref Rng & Units 09/18/2019 02/09/2019 11/08/2018  Glucose 70 - 99 mg/dL 146(H) 112(H) 96  BUN 8 - 23 mg/dL _0 Creatinine 0.44 - 1.00 mg/dL 0.66 0.66 0.69  Sodium 135 - 145 mmol/L 140 140 139  Potassium 3.5 - 5.1 mmol/L 4.2 3.5 4.1  Chloride 98 - 111 mmol/L 104 102 103  CO2 22 - 32 mmol/L _1 Calcium 8.9 - 10.3 mg/dL 9.0 8.9 9.5    Hepatic Function Latest Ref Rng & Units 09/18/2019 02/09/2019 11/08/2018  Total Protein 6.5 - 8.1 g/dL 7.1 7.0 7.5  Albumin 3.5 - 5.0 g/dL 3.8 4.0 4.2  AST 15 - 41 U/L _2 ALT 0 - 44 U/L _3 Alk Phosphatase 38 - 126 U/L 74 74 77  Total Bilirubin 0.3 - 1.2 mg/dL 0.3 0.4 0.5  Bilirubin, Direct 0.0 - 0.3 mg/dL - - -    Lab Results  Component Value Date   CHOL 210 (H) 11/08/2018   Lab Results  Component Value Date   HDL 52.30 11/08/2018   Lab Results  Component Value Date   LDLCALC 142 (H) 11/08/2018   Lab Results  Component Value Date   TRIG 80.0 11/08/2018   Lab Results  Component Value Date   CHOLHDL 4 11/08/2018   No results for input(s): PSA in the last 72 hours. No results found for: HCVAB Lab Results  Component  Value Date   VD25OH 10.58 (L) 11/24/2018     Lab Results  Component Value Date   HGBA1C 5.9 11/08/2018   HGBA1C 6.1 11/04/2017   HGBA1C 5.9 10/06/2016   Lab Results  Component Value Date   LDLCALC 142 (H) 11/08/2018   CREATININE 0.66 09/18/2019    Assessment and Plan:     ICD-10-CM   1. Hypoxic  R09.02   2. ILD (interstitial lung disease) ([place])  J84.9   3. Dyspnea on exertion  R06.00  4. Acute respiratory distress  R06.03    Total encounter time: 40 minutes. On the day of the patient encounter, this can include review of prior records, labs, and imaging.  Additional time can include counselling, consultation with peer MD in person or by telephone.  This also includes independent review of Radiology.  This is a life-threatening event potentially, she was placed on 2 L of oxygen, nurses as well as CMA's were able to help the patient while in the office.  The patient appears to be in acute hypoxic respiratory failure in the setting of interstitial lung disease.  She is notably dyspneic on rest and she was more dyspneic with ambulation, and she had a pulse ox of 82% after walking to the examination room.  This did recover to a maximum of 92%.  She notes that she does have some shortness of breath occasionally, but is nothing like this at all.  EMS was activated.  Follow-up: No follow-ups on file.  No orders of the defined types were placed in this encounter.  There are no discontinued medications. No orders of the defined types were placed in this encounter.   Signed,  Maud Deed. Krist Rosenboom, MD   Outpatient Encounter Medications as of 04/01/2020  Medication Sig  . acetaminophen (TYLENOL) 500 MG tablet Take 1,000 mg by mouth 3 (three) times daily.  . Adalimumab (HUMIRA PEN [place]) Inject 40 mg into the skin every 14 (fourteen) days.   Marland Kitchen FOLIC ACID PO Take 20 mg by mouth daily.   . hydrochlorothiazide (HYDRODIURIL) 25 MG tablet Take 12.5 mg by mouth every other day.  . mometasone  (ELOCON) 0.1 % ointment Apply topically 2 (two) times daily.   No facility-administered encounter medications on file as of 04/01/2020.

## 2020-04-01 NOTE — Progress Notes (Signed)
[place] for Heparin Indication: pulmonary embolus  Allergies  Allergen Reactions  . Codeine     REACTION: Nausea and vomiting  . Hydrocodone-Homatropine Other (See Comments)    hallucinations    Patient Measurements: Height: 5\' 4"  (162.6 cm) IBW/kg (Calculated) : 54.7 Heparin Dosing Weight: 74.4 kg  Vital Signs: Temp: 98.5 F (36.9 C) (06/28 1149) Temp Source: Oral (06/28 1149) BP: 169/90 (06/28 1500) Pulse Rate: 81 (06/28 1500)  Labs: Recent Labs    04/01/20 1204  HGB 13.9  HCT 42.8  PLT 180  CREATININE 0.69  TROPONINIHS 34*    Estimated Creatinine Clearance: 61.6 mL/min (by C-G formula based on SCr of 0.69 mg/dL).   Medical History: Past Medical History:  Diagnosis Date  . Cancer [facility])    breast  . Cataract    diagnosed by Dr. Phineas Douglas  . Dysrhythmia    PSVT/ palpitations-  controlled with prn Inderal  . GERD (gastroesophageal reflux disease)    30 years ago.   Marland Kitchen Hyperlipidemia    statin intolerant  . Hypertension    eccho 4/13 EPIC  . Migraines   . Obesity   . Osteoarthritis   . Phlebitis of leg, right, superficial 1986/1989   x 2    Medications:  Scheduled:  . acetaminophen  1,000 mg Oral TID  . amLODipine  5 mg Oral Daily  . docusate sodium  100 mg Oral BID  . folic acid  1 mg Oral Daily  . mometasone   Topical BID  . sodium chloride flush  3 mL Intravenous Q12H    Assessment: Patient is a 38 yof that presented to the ED with SOB. The was found to have a PE on further evaluation. Pharmacy has been asked to dose heparin at this time for her PE.   Goal of Therapy:  Heparin level 0.3-0.7 units/ml Monitor platelets by anticoagulation protocol: Yes   Plan:  - Heparin bolus 5200 units IV x 1 dose - Heparin drip @ 1200 units/hr - Heparin level in ~ 6 hours  - Monitor patient for s/s of bleeding and CBC while on heparin   Duanne Limerick PharmD. BCPS  04/01/2020,3:28 PM

## 2020-04-01 NOTE — H&P (Signed)
History and Physical    [name] JSE:[id number] DOB: [birth year] DOA: 04/01/2020  PCP: [name], MD Consultants:  Johnsie Cancel - cardiology; Isidore Moos - rad onc; Lindi Adie - oncology; Chase Caller - pulmonology Patient coming from:  Home - lives with husband; NOK: Husband, [place], [phone]  Chief Complaint: SOB  HPI: [name] is a 79 y.o. female with medical history significant of obesity (BMI 35.96); HTN; HLD; ILD; RA and breast cancer presenting with SOB.  She reports that for the last few months she has been taking Humira.  She takes the shot on Saturday and Monday-Tuesday in the middle of the night she wakes up feeling like she is having a heart attack and then it goes away. On Friday, she called her doctor's office and reported this and they thought it was not this.  She spoke with her cardiologist on Friday.  He encouraged a CT, scheduled for Thursday.  They encouraged her to f/u with her pulmonary doctor - who she saw 1 time several years ago.  Last night she went to go to the bathroom - as short distance but she was terribly SOB.  She finally went back to sleep.  She got up this morning and went for her daily walk and wasn't able to go and so she went back home and called her PCP.  They advised her to go to the ER and she refused; she went to her PCP and "I think I scared him."    She has not been as mobile as usual because of severe fatigue, particularly in the last month.  She had breast cancer surgery on 12/16.  She went for radiation, completed in March.  In the last 2 weeks, she walks the dog, does a few chores, and falls back asleep in the chair.  No recent orthopedic injuries.  No difference between legs.  She is on a mild diuretic and she has incontinence, which is very frustrating for her.  She has completed treatment for her breast cancer, no chemo.    ED Course:  3 months progressive SOB.  Acutely worse last night.  B PEs found without heart strain.  Review of  Systems: As per HPI; otherwise review of systems reviewed and negative.   Ambulatory Status:  Ambulates without assistance  COVID Vaccine Status:  Complete  Past Medical History:  Diagnosis Date  . Cancer [facility])    breast  . Cataract    diagnosed by Dr. Phineas Douglas  . Dysrhythmia    PSVT/ palpitations-  controlled with prn Inderal  . GERD (gastroesophageal reflux disease)    30 years ago.   Marland Kitchen Hyperlipidemia    statin intolerant  . Hypertension    eccho 4/13 EPIC  . Migraines   . Obesity   . Osteoarthritis   . Phlebitis of leg, right, superficial 1986/1989   x 2    Past Surgical History:  Procedure Laterality Date  . BREAST LUMPECTOMY WITH RADIOACTIVE SEED LOCALIZATION Bilateral 09/20/2019   Procedure: BILATERAL BREAST LUMPECTOMIES WITH BILATERAL RADIOACTIVE SEED LOCALIZATION AND RIGHT NIPPLE BIOPSY;  Surgeon: Erroll Luna, MD;  Location: [facility];  Service: General;  Laterality: Bilateral;  . CARDIAC CATHETERIZATION  1990s   negative  . CARDIOVASCULAR STRESS TEST  2008   NML  . CARDIOVASCULAR STRESS TEST  02/02/2007   EF 70%, NO ISCHEMIA  . DILATION AND CURETTAGE, DIAGNOSTIC / THERAPEUTIC  2008  . LESION DESTRUCTION  10/2017   Face; Dr. Ronnald Ramp  . ROTATOR CUFF  REPAIR  1999   Right, left in 2002  . 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 US ECHOCARDIOGRAPHY  02/07/2007   EF 55-60%    Social History   Socioeconomic History  . Marital status: Married    Spouse name: Not on file  . Number of children: Not on file  . Years of education: Not on file  . Highest education level: Not on file  Occupational History  . Occupation: Retired  Tobacco Use  . Smoking status: Former Smoker    Packs/day: 1.00    Years: 5.00    Pack years: 5.00    Quit date: 03/15/1978    Years since quitting: 42.0   . Smokeless tobacco: Never Used  . Tobacco comment: quit smoking 40 years ago  Vaping Use  . Vaping Use: Never used  Substance and Sexual Activity  . Alcohol use: No    Comment: stopped 5 years ago  . Drug use: No  . Sexual activity: Yes  Other Topics Concern  . Not on file  Social History Narrative   Regular exercise: yes walks 1 mile a day   Diet: loves butter, fruit and veggies   Social Determinants of Health   Financial Resource Strain:   . Difficulty of Paying Living Expenses:   Food Insecurity:   . Worried About Charity fundraiser in the Last Year:   . Arboriculturist in the Last Year:   Transportation Needs:   . Film/video editor (Medical):   Marland Kitchen Lack of Transportation (Non-Medical):   Physical Activity:   . Days of Exercise per Week:   . Minutes of Exercise per Session:   Stress:   . Feeling of Stress :   Social Connections:   . Frequency of Communication with Friends and Family:   . Frequency of Social Gatherings with Friends and Family:   . Attends Religious Services:   . Active Member of Clubs or Organizations:   . Attends Archivist Meetings:   Marland Kitchen Marital Status:   Intimate Partner Violence:   . Fear of Current or Ex-Partner:   . Emotionally Abused:   Marland Kitchen Physically Abused:   . Sexually Abused:     Allergies  Allergen Reactions  . Codeine     REACTION: Nausea and vomiting  . Hydrocodone-Homatropine Other (See Comments)    hallucinations    Family History  Problem Relation Age of Onset  . Heart attack Father 9       PUD  . Hypertension Father   . Lung cancer Brother   . Heart defect Sister   . Hypertension Mother   . Coronary artery disease Other        Female 1st degree relative <50    Prior to Admission medications   Medication Sig Start Date End Date Taking? Authorizing Provider  acetaminophen (TYLENOL) 500 MG tablet Take 1,000 mg by mouth 3 (three) times daily.    [provider]  Adalimumab (HUMIRA PEN [place]) Inject  40 mg into the skin every 14 (fourteen) days.     [provider]  FOLIC ACID PO Take 20 mg by mouth daily.     [provider]  hydrochlorothiazide (HYDRODIURIL) 25 MG tablet Take 12.5 mg by mouth every other day.    [provider]  mometasone (ELOCON) 0.1 % ointment Apply topically 2 (two) times daily. 12/15/18   Owens Loffler, MD    Physical Exam: Vitals:   04/01/20 1313 04/01/20 1330 04/01/20 1457 04/01/20 1500  BP: (!) 180/85 (!) 167/88 (!) 175/87 (!) 169/90  Pulse: 85 79 82 81  Resp: (!) 24 (!) 25 (!) 28 17  Temp:      TempSrc:      SpO2: 99% 99% 98% 98%  Height:         . General:  Appears calm and comfortable and is NAD . Eyes:  PERRL, EOMI, normal lids, iris . ENT:  grossly normal hearing, lips & tongue, mmm; appropriate dentition . Neck:  no LAD, masses or thyromegaly . Cardiovascular:  RRR, no m/r/g. No LE edema.  Marland Kitchen Respiratory:   CTA bilaterally with no wheezes/rales/rhonchi.  Normal respiratory effort while at rest; got up to the bedside commode and became profoundly dyspneic with sats to 88%. . Abdomen:  soft, NT, ND, NABS . Back:   normal alignment, no CVAT . Skin:  no rash or induration seen on limited exam . Musculoskeletal:  grossly normal tone BUE/BLE, good ROM, no bony abnormality . Psychiatric:  grossly normal mood and affect, speech fluent and appropriate, AOx3 Neurologic:  CN 2-12 grossly intact, moves all extremities in coordinated fashion   Radiological Exams on Admission: CT Angio Chest PE W/Cm &/Or Wo Cm  Result Date: 04/01/2020 CLINICAL DATA:  Shortness of breath for 1 month. Decreased oxygen saturation. Wheezing. EXAM: CT ANGIOGRAPHY CHEST WITH CONTRAST TECHNIQUE: Multidetector CT imaging of the chest was performed using the standard protocol during bolus administration of intravenous contrast. Multiplanar CT image reconstructions and MIPs were obtained to evaluate the vascular anatomy. CONTRAST:  100 mL OMNIPAQUE IOHEXOL  350 MG/ML SOLN COMPARISON:  Single-view of the chest today.  CT chest 03/10/2018. FINDINGS: Cardiovascular: The patient has bilateral pulmonary emboli with clot seen in both main pulmonary arteries, segmental branches bilaterally and the ascending and descending interlobar pulmonary arteries on the left. There is no evidence of right heart strain. No pericardial effusion. Calcific aortic and coronary atherosclerosis is noted. Mediastinum/Nodes: No enlarged mediastinal, hilar, or axillary lymph nodes. Thyroid gland, trachea, and esophagus demonstrate no significant findings. Lungs/Pleura: A 0.4 cm right upper lobe nodule on image 45 abuts the minor fissure and is new since the prior exam. Mild dependent atelectasis is present. No pulmonary infarct or consolidative process. No pleural effusion. Upper Abdomen: 2.1 cm stone in the gallbladder is identified. No evidence of cholecystitis. Fatty infiltration of the liver is noted. Musculoskeletal: No acute or focal bony abnormality. Marked cervical spondylosis is partially imaged. Review of the MIP images confirms the above findings. IMPRESSION: The examination is positive for pulmonary embolus without evidence of right heart strain. 0.4 cm right upper lobe pulmonary nodule is new since the prior CT. No follow-up needed if patient is low-risk. Non-contrast chest CT can be considered in 12 months if patient is high-risk. This recommendation follows the consensus statement: Guidelines for Management of Incidental Pulmonary Nodules Detected on CT Images: From the Fleischner Society 2017; Radiology 2017; 284:228-243. Fatty infiltration of the liver. Gallstone without evidence of cholecystitis. Aortic Atherosclerosis (ICD10-I70.0). Critical Value/emergent results were called by telephone at the time of interpretation on 04/01/2020 at 2:00 pm to provider Gwenith Daily, RN, who verbally acknowledged these results. Electronically Signed   By: Inge Rise M.D.   On: 04/01/2020  14:06   DG Chest [facility] 1 View  Result  Date: 04/01/2020 CLINICAL DATA:  Shortness of breath. EXAM: PORTABLE CHEST 1 VIEW COMPARISON:  PA and lateral chest 11/07/2015. FINDINGS: The lungs are clear. Heart size is normal. No pneumothorax or pleural fluid. No acute or focal bony abnormality. IMPRESSION: Negative chest. Electronically Signed   By: Inge Rise M.D.   On: 04/01/2020 12:41    EKG: Independently reviewed.  NSR with rate 83; nonspecific ST changes with no evidence of acute ischemia   Labs on Admission: I have personally reviewed the available labs and imaging studies at the time of the admission.  Pertinent labs:   Unremarkable CMP BNP 47.0 HS troponin 34 Normal CBC D-dimer 13.46 COVID pending   Assessment/Plan Principal Problem:   Bilateral pulmonary embolism (HCC) Active Problems:   Hyperlipidemia   Essential hypertension, benign   Prediabetes   Severe obesity with body mass index (BMI) of 36.0 to 36.9 with serious comorbidity (HCC)   Rheumatoid arthritis (HCC)   Ductal carcinoma in situ (DCIS) of right breast   B PE -Patient without prior episodes of thromboembolic disease presenting with new diffuse B PE -No recent orthopedic injury, trauma, or other new mobility issue -Possibly related to recent malignancy, although not advanced cancer and so does not appear to currently need lifelong therapy -She remains quite dyspneic and hypoxic with movement and so likely will need longer evaluation and treatment -Will admit to progressive care -Initiate anticoagulation - for now, will start treatment-dose Heparin per pharmacy -She is likely able to transition to alternative [facility] agent within the next few days -We discussed risks/benefits of Coumadin vs. DOAC and she clearly prefers DOAC therapy if possible -The patient understands that thromboembolic disease can be catastrophic and even deadly and that she must be complaint with physician appointments and  anticoagulation. -Dr. Lindi Adie was notified of her admission and agrees with the plan, likely 6-12 months of DOAC therapy at this time. -Will check Echo to evaluate for R heart strain -Will not check LE [place] at this time given normal exam and the fact that it will not change the plan at this time  Breast CA -12/16 B lumpectomies with R nipple adenoma with intraductal papilloma and DCIS with clear margins -She had subsequent radiation therapy from 2/23-3/16 -She decided not to receive antiestrogen therapy -She is due for mammogram in 9/21 and f/u with oncology in 3/22  RA -She recently changed from Enbrel to Humira -Humira is associated with VTE <5% of the time - but there is a slight risk that this was the issue -Will defer to rheum/hematology/PCP about whether to continue  HTN -She reports significant frustration with HCTZ therapy (urinary incontinence) -Will hold and start Norvasc 5 mg PO daily  HLD -she does not appear to be taking medications for this issue at this time  Pre-diabetes -Glucose minimally elevated at this time -May be stress response -Will follow with fasting AM labs -It is unlikely that she will need acute or chronic treatment for this issue at this time  Obesity Body mass index is 35.96 kg/m. -BMI 33.5 -Weight loss should be encouraged -Outpatient PCP/bariatric medicine f/u encouraged   Note: This patient has been tested and is negative for the novel coronavirus COVID-19.  DVT prophylaxis: Heparin Code Status:  Full - confirmed with patient/family Family Communication: Son was present throughout evaluation. Disposition Plan:  The patient is from: home  Anticipated d/c is to: home without [facility] services   Anticipated d/c date will depend on clinical response to treatment, likely 2-3 days  Patient is currently: acutely ill Consults called: Oncology (Secure Chat message notification only) Admission status:  Admit - It is my clinical opinion that admission to  INPATIENT is reasonable and necessary because of the expectation that this patient will require hospital care that crosses at least 2 midnights to treat this condition based on the medical complexity of the problems presented.  Given the aforementioned information, the predictability of an adverse outcome is felt to be significant.    Karmen Bongo MD [facility]   How to contact the [facility] Attending or Consulting provider [place] or covering provider during after hours [place], for this patient?  1. Check the care team in [facility] and look for a) attending/consulting [facility] provider listed and b) the [facility] team listed 2. Log into [email or web address] and use [place]'s universal password to access. If you do not have the password, please contact the hospital operator. 3. Locate the [facility] provider you are looking for under [facility] and page to a number that you can be directly reached. 4. If you still have difficulty reaching the provider, please page the [facility] (Director on Call) for the Hospitalists listed on amion for assistance.   04/01/2020, 3:19 PM

## 2020-04-01 NOTE — ED Notes (Signed)
Pt transported to CT ?

## 2020-04-01 NOTE — ED Notes (Signed)
Pt requesting to get out of bed to use restroom, spoke to pt about dyspnea after walking to restroom. Pt asks to try bedside commode rather than bedpan or purewick. Attempted bedside commode, pt independent but became SOB and began wheezing, spO2 86% on RA during this activity. Returned to 98% with rest and 2L O2, pt agrees with plan not to get OOB moving forward.

## 2020-04-01 NOTE — Progress Notes (Signed)
Patient arrived from ED to 4e25, patient placed on monitor, vital signs obtained and CCMD made aware of patient on monitor. IV heparin infusing at 58ml/hr on the pump. Patient with out complaints of pain but does state she is a little short of breath. CHG bath completed. Patient oriented to room. Will monitor patient. Sheree Lalla, Bettina Gavia RN

## 2020-04-01 NOTE — Telephone Encounter (Signed)
Access Nurse called back and wanted to get patient an appt with pcp. They said pt refused going to ED. Scheduled pt appt with Dr. Lorelei Pont @ 10:40am.

## 2020-04-01 NOTE — Telephone Encounter (Signed)
[facility] - Client TELEPHONE ADVICE RECORD AccessNurse Patient Name: Francesca Oman Gender: Female DOB: 18-Jul-1941 Age: 79 Y 3 D Return Phone Number: [phone] (Primary) Address: City/State/Zip: [place] [address] Client [facility] - Client Client Site [place] - Day Physician Eliezer Lofts - MD Contact Type Call Who Is Calling Patient / Member / Family / Caregiver Call Type Triage / Clinical Relationship To Patient Self Return Phone Number [phone] (Primary) Chief Complaint BREATHING - shortness of breath or sounds breathless Reason for Call Symptomatic / Request for Health Information Initial Comment The pt has difficulty breathing. Translation No Nurse Assessment Nurse: Durene Cal, RN, Brandi Date/Time (Eastern Time): 04/01/2020 8:31:18 AM Confirm and document reason for call. If symptomatic, describe symptoms. ---The pt has difficulty breathing/ sob. Has the patient had close contact with a person known or suspected to have the novel coronavirus illness OR traveled / lives in area with major community spread (including international travel) in the last 14 days from the onset of symptoms? * If Asymptomatic, screen for exposure and travel within the last 14 days. ---No Does the patient have any new or worsening symptoms? ---Yes Will a triage be completed? ---Yes Related visit to physician within the last 2 weeks? ---Yes Does the PT have any chronic conditions? (i.e. diabetes, asthma, this includes High risk factors for pregnancy, etc.) ---Yes List chronic conditions. ---RA ; humaira HTN Is this a behavioral health or substance abuse call? ---No Guidelines Guideline Title Affirmed Question Affirmed Notes Nurse Date/Time (Eastern Time) Breathing Difficulty [1] MODERATE difficulty breathing (e.g., speaks in phrases, SOB even at rest, pulse 100-120) AND [2] NEWonset or WORSE  than normal Durene Cal, RN, Velna Hatchet 04/01/2020 8:34:19 AMPLEASE NOTE: All timestamps contained within this report are represented as [place] Standard Time. CONFIDENTIALTY NOTICE: This fax transmission is intended only for the addressee. It contains information that is legally privileged, confidential or otherwise protected from use or disclosure. If you are not the intended recipient, you are strictly prohibited from reviewing, disclosing, copying using or disseminating any of this information or taking any action in reliance on or regarding this information. If you have received this fax in error, please notify us immediately by telephone so that we can arrange for its return to [place]. Phone: [phone], Toll-Free: [phone], Fax: [phone] Page: 2 of 2 Call Id: [id number] [place]. Time Eilene Ghazi Time) Disposition Final User 04/01/2020 8:27:51 AM Send to Urgent Queue Carlis Stable 04/01/2020 8:37:44 AM Go to ED Now Yes Durene Cal, RN, Berdie Ogren Disagree/Comply Disagree Caller Understands Yes PreDisposition Call Doctor Care Advice Given Per Guideline GO TO ED NOW: * You need to be seen in the Emergency Department. * Another adult should drive. * If immediate transportation is not available via car or taxi, then the patient should be instructed to call [id number]. BRING MEDICINES: * Please bring a list of your current medicines when you go to the Emergency Department (ER). * It is also a good idea to bring the pill bottles too. This will help the doctor to make certain you are taking the right medicines and the right dose. Comments User: Ermalene Postin, RN Date/Time Eilene Ghazi Time): 04/01/2020 8:31:54 AM RA User: Ermalene Postin, RN Date/Time Eilene Ghazi Time): 04/01/2020 8:32:56 AM HX, reports she sees mds: RA, pulmonary md caridology Humara User: Ermalene Postin, RN Date/Time (Eastern Time): 04/01/2020 8:33:27 AM Humaira shot every 2 weeks and last dose was Saturday. User: Ermalene Postin, RN  Date/Time Eilene Ghazi Time):  04/01/2020 8:35:48 AM Caller states she wheezes all the time. User: Ermalene Postin, RN Date/Time Eilene Ghazi Time): 04/01/2020 8:41:56 AM Caller warm transferred to the back line due to her refusing ED visit and requesting an appt with dr. Referrals GO TO FACILITY REFUSED REFERRED TO PCP OFFICE Warm transfer to backline

## 2020-04-01 NOTE — ED Provider Notes (Signed)
[facility] EMERGENCY DEPARTMENT Provider Note   CSN: [id number] Arrival date & time: 04/01/20  1144     History Chief Complaint  Patient presents with  . Shortness of Breath    RAINA SOLE is a 79 y.o. female.  The history is provided by the patient and medical records. No language interpreter was used.  Shortness of Breath  [name] is a 79 y.o. female who presents to the Emergency Department complaining of shortness of breath. She presents the emergency department from her doctor's office for evaluation of progressive shortness of breath over the last three months. Symptoms began after starting Humira for her rheumatoid arthritis. She complains of shortness of breath that wakes her at night sometimes as well as significant dyspnea on exertion. Symptoms significantly worsened over the last 24 hours. She has occasional chest tightness. She denies any fevers, cough, diaphoresis, abdominal pain, blocker blood he stools. When she saw her PCP earlier today she was noted to be tachypneic with sats of 82% on room air.    Past Medical History:  Diagnosis Date  . Cancer [facility])    breast  . Cataract    diagnosed by Dr. Phineas Douglas  . Dysrhythmia    PSVT/ palpitations-  controlled with prn Inderal  . GERD (gastroesophageal reflux disease)    30 years ago.   Marland Kitchen Hyperlipidemia    statin intolerant  . Hypertension    eccho 4/13 EPIC  . Migraines   . Obesity   . Osteoarthritis   . Phlebitis of leg, right, superficial 1986/1989   x 2    Patient Active Problem List   Diagnosis Date Noted  . Bilateral pulmonary embolism ([place]) 04/01/2020  . Urinary frequency 09/07/2019  . 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 Peripheral edema 02/09/2019  . Chronic fatigue 11/24/2018  . ILD (interstitial lung disease) ([place]) 11/24/2018  . Pulmonary nodule 11/24/2018  . Urge incontinence 11/11/2017  . Chronic diarrhea 05/11/2017  . Rheumatoid  arthritis ([place]) 01/24/2016  . Counseling regarding end of life decision making 09/11/2015  . Chronic low back pain 09/11/2015  . Severe obesity with body mass index (BMI) of 36.0 to 36.9 with serious comorbidity ([place]) 10/12/2012  . 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/12/2012  . PSVT (paroxysmal supraventricular tachycardia) ([place]) 02/15/2012  . Dizziness 06/13/2008  . Prediabetes 01/10/2008  . Hyperlipidemia 01/04/2008  . Essential hypertension, benign 01/04/2008  . GERD 01/04/2008  . OSTEOARTHRITIS 01/04/2008  . SUPERFICIAL THROMBOPHLEBITIS 10/05/1988    Past Surgical History:  Procedure Laterality Date  . BREAST LUMPECTOMY WITH RADIOACTIVE SEED LOCALIZATION Bilateral 09/20/2019   Procedure: BILATERAL BREAST LUMPECTOMIES WITH BILATERAL RADIOACTIVE SEED LOCALIZATION AND RIGHT NIPPLE BIOPSY;  Surgeon: Erroll Luna, MD;  Location: [facility];  Service: General;  Laterality: Bilateral;  . CARDIAC CATHETERIZATION  1990s   negative  . CARDIOVASCULAR STRESS TEST  2008   NML  . CARDIOVASCULAR STRESS TEST  02/02/2007   EF 70%, NO ISCHEMIA  . DILATION AND CURETTAGE, DIAGNOSTIC / THERAPEUTIC  2008  . LESION DESTRUCTION  10/2017   Face; Dr. Ronnald Ramp  . ROTATOR CUFF REPAIR  1999   Right, left in 2002  . 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 US ECHOCARDIOGRAPHY  02/07/2007   EF 55-60%  OB History   No obstetric history on file.     Family History  Problem Relation Age of Onset  . Heart attack Father 22       PUD  . Hypertension Father   . Lung cancer Brother   . Heart defect Sister   . Hypertension Mother   . Coronary artery disease Other        Female 1st degree relative <50    Social History   Tobacco Use  . Smoking status: Former Smoker    Packs/day: 1.00    Years: 5.00    Pack years: 5.00    Quit date:  03/15/1978    Years since quitting: 42.0  . Smokeless tobacco: Never Used  . Tobacco comment: quit smoking 40 years ago  Vaping Use  . Vaping Use: Never used  Substance Use Topics  . Alcohol use: No    Comment: stopped 5 years ago  . Drug use: No    Home Medications Prior to Admission medications   Medication Sig Start Date End Date Taking? Authorizing Provider  acetaminophen (TYLENOL) 500 MG tablet Take 1,000 mg by mouth 3 (three) times daily.   Yes [provider]  Adalimumab (HUMIRA PEN [place]) Inject 40 mg into the skin every 14 (fourteen) days.    Yes [provider]  folic acid (FOLVITE) 1 MG tablet Take 1 mg by mouth daily.    Yes [provider]  hydrochlorothiazide (HYDRODIURIL) 25 MG tablet Take 12.5 mg by mouth every other day.   Yes [provider]  mometasone (ELOCON) 0.1 % ointment Apply topically 2 (two) times daily. 12/15/18  Yes Copland, Frederico Hamman, MD    Allergies    Codeine and Hydrocodone-homatropine  Review of Systems   Review of Systems  Respiratory: Positive for shortness of breath.   All other systems reviewed and are negative.   Physical Exam Updated Vital Signs BP (!) 166/89   Pulse 81   Temp 98.5 F (36.9 C) (Oral)   Resp (!) 28   Ht 5\' 4"  (1.626 m)   SpO2 98%   BMI 35.96 kg/m   Physical Exam Vitals and nursing note reviewed.  Constitutional:      Appearance: She is well-developed.  HENT:     Head: Normocephalic and atraumatic.  Cardiovascular:     Rate and Rhythm: Normal rate and regular rhythm.     Heart sounds: No murmur heard.   Pulmonary:     Effort: Pulmonary effort is normal. No respiratory distress.     Breath sounds: Normal breath sounds.  Abdominal:     Palpations: Abdomen is soft.     Tenderness: There is no abdominal tenderness. There is no guarding or rebound.  Musculoskeletal:        General: No tenderness.     Comments: Trace edema to BLE  Skin:    General: Skin is warm and dry.   Neurological:     Mental Status: She is alert and oriented to person, place, and time.  Psychiatric:        Behavior: Behavior normal.     ED Results / Procedures / Treatments   Labs (all labs ordered are listed, but only abnormal results are displayed) Labs Reviewed  COMPREHENSIVE METABOLIC PANEL - Abnormal; Notable for the following components:      Result Value   Glucose, Bld 102 (*)    BUN 7 (*)    All other components within normal limits  D-DIMER, QUANTITATIVE (NOT AT  [facility]) - Abnormal; Notable for the following components:   D-Dimer, Quant 13.46 (*)    All other components within normal limits  TROPONIN I (HIGH SENSITIVITY) - Abnormal; Notable for the following components:   Troponin I (High Sensitivity) 34 (*)    All other components within normal limits  SARS CORONAVIRUS 2 BY RT PCR (HOSPITAL ORDER, [facility])  CBC WITH DIFFERENTIAL/PLATELET  BRAIN NATRIURETIC PEPTIDE  HEPARIN LEVEL (UNFRACTIONATED)  TROPONIN I (HIGH SENSITIVITY)    EKG EKG Interpretation  Date/Time:  Monday April 01 2020 11:53:05 EDT Ventricular Rate:  83 PR Interval:    QRS Duration: 85 QT Interval:  377 QTC Calculation: 443 R Axis:   45 Text Interpretation: Sinus rhythm Borderline repolarization abnormality no prior available for comparison Confirmed by Quintella Reichert [phone]) on 04/01/2020 1:09:04 PM   Radiology CT Angio Chest PE W/Cm &/Or Wo Cm  Result Date: 04/01/2020 CLINICAL DATA:  Shortness of breath for 1 month. Decreased oxygen saturation. Wheezing. EXAM: CT ANGIOGRAPHY CHEST WITH CONTRAST TECHNIQUE: Multidetector CT imaging of the chest was performed using the standard protocol during bolus administration of intravenous contrast. Multiplanar CT image reconstructions and MIPs were obtained to evaluate the vascular anatomy. CONTRAST:  100 mL OMNIPAQUE IOHEXOL 350 MG/ML SOLN COMPARISON:  Single-view of the chest today.  CT chest 03/10/2018. FINDINGS:  Cardiovascular: The patient has bilateral pulmonary emboli with clot seen in both main pulmonary arteries, segmental branches bilaterally and the ascending and descending interlobar pulmonary arteries on the left. There is no evidence of right heart strain. No pericardial effusion. Calcific aortic and coronary atherosclerosis is noted. Mediastinum/Nodes: No enlarged mediastinal, hilar, or axillary lymph nodes. Thyroid gland, trachea, and esophagus demonstrate no significant findings. Lungs/Pleura: A 0.4 cm right upper lobe nodule on image 45 abuts the minor fissure and is new since the prior exam. Mild dependent atelectasis is present. No pulmonary infarct or consolidative process. No pleural effusion. Upper Abdomen: 2.1 cm stone in the gallbladder is identified. No evidence of cholecystitis. Fatty infiltration of the liver is noted. Musculoskeletal: No acute or focal bony abnormality. Marked cervical spondylosis is partially imaged. Review of the MIP images confirms the above findings. IMPRESSION: The examination is positive for pulmonary embolus without evidence of right heart strain. 0.4 cm right upper lobe pulmonary nodule is new since the prior CT. No follow-up needed if patient is low-risk. Non-contrast chest CT can be considered in 12 months if patient is high-risk. This recommendation follows the consensus statement: Guidelines for Management of Incidental Pulmonary Nodules Detected on CT Images: From the Fleischner Society 2017; Radiology 2017; 284:228-243. Fatty infiltration of the liver. Gallstone without evidence of cholecystitis. Aortic Atherosclerosis (ICD10-I70.0). Critical Value/emergent results were called by telephone at the time of interpretation on 04/01/2020 at 2:00 pm to provider Gwenith Daily, RN, who verbally acknowledged these results. Electronically Signed   By: Inge Rise M.D.   On: 04/01/2020 14:06   DG Chest Port 1 View  Result Date: 04/01/2020 CLINICAL DATA:  Shortness of  breath. EXAM: PORTABLE CHEST 1 VIEW COMPARISON:  PA and lateral chest 11/07/2015. FINDINGS: The lungs are clear. Heart size is normal. No pneumothorax or pleural fluid. No acute or focal bony abnormality. IMPRESSION: Negative chest. Electronically Signed   By: Inge Rise M.D.   On: 04/01/2020 12:41    Procedures Procedures (including critical care time) CRITICAL CARE Performed by: Quintella Reichert   Total critical care time: 45 minutes  Critical care time was exclusive of separately  billable procedures and treating other patients.  Critical care was necessary to treat or prevent imminent or life-threatening deterioration.  Critical care was time spent personally by me on the following activities: development of treatment plan with patient and/or surrogate as well as nursing, discussions with consultants, evaluation of patient's response to treatment, examination of patient, obtaining history from patient or surrogate, ordering and performing treatments and interventions, ordering and review of laboratory studies, ordering and review of radiographic studies, pulse oximetry and re-evaluation of patient's condition. Medications Ordered in ED Medications  heparin ADULT infusion 100 units/mL (25000 units/241mL sodium chloride 0.45%) (1,200 Units/hr Intravenous New Bag/Given 04/01/20 1503)  acetaminophen (TYLENOL) tablet 1,000 mg (has no administration in time range)  folic acid (FOLVITE) tablet 1 mg (has no administration in time range)  mometasone (ELOCON) 0.1 % cream 1 application (has no administration in time range)  sodium chloride flush (NS) 0.9 % injection 3 mL (has no administration in time range)  morphine 2 MG/ML injection 2 mg (has no administration in time range)  zolpidem (AMBIEN) tablet 5 mg (has no administration in time range)  docusate sodium (COLACE) capsule 100 mg (has no administration in time range)  polyethylene glycol (MIRALAX / GLYCOLAX) packet 17 g (has no  administration in time range)  bisacodyl (DULCOLAX) EC tablet 5 mg (has no administration in time range)  ondansetron (ZOFRAN) tablet 4 mg (has no administration in time range)    Or  ondansetron (ZOFRAN) injection 4 mg (has no administration in time range)  hydrALAZINE (APRESOLINE) injection 5 mg (has no administration in time range)  amLODipine (NORVASC) tablet 5 mg (has no administration in time range)  iohexol (OMNIPAQUE) 350 MG/ML injection 100 mL (100 mLs Intravenous Contrast Given 04/01/20 1338)  heparin bolus via infusion 5,200 Units (5,200 Units Intravenous Bolus from Bag 04/01/20 1503)    ED Course  I have reviewed the triage vital signs and the nursing notes.  Pertinent labs & imaging results that were available during my care of the patient were reviewed by me and considered in my medical decision making (see chart for details).    MDM Rules/Calculators/A&P                          Pt presents to the ED for progressive SOB, DOE.  She has increased work of breathing and hypoxia on exertion.  Lung exam clear.  CXR without acute infiltrate or evidence of CHF.  CTA obtained, which demonstrates bilateral pulmonary embolism.  She was started on a heparin drip.  Pt updated of findings of studies and recommendation for admission and she is in agreement with treatment plan. Hospitalist consulted for admission. Final Clinical Impression(s) / ED Diagnoses Final diagnoses:  Other acute pulmonary embolism without acute cor pulmonale ([place])    Rx / DC Orders ED Discharge Orders    None       Quintella Reichert, MD 04/01/20 1541

## 2020-04-01 NOTE — Telephone Encounter (Signed)
Pt called in and said she is having trouble breathing. I asked how long this has been going on she said for a while but she woke up this morning and it is worse. Transferred patient to Access Nurse.

## 2020-04-01 NOTE — Progress Notes (Signed)
Patient received via stretcher with E.R., R.N. . Alert and orin. R.N. into to do assess. Patient orint. To room. NO complaints voiced

## 2020-04-01 NOTE — ED Notes (Signed)
Pt breathing unlabored and maintaining 94% on RA. Allowed to walk to restroom, returned at 82% with wheezing, tachypnea, and dyspnea. Once at rest , spo2 returned to 96% with 1L [place]. Pt wheezing and dyspnea improve with rest and 1L [place] at well.

## 2020-04-01 NOTE — ED Triage Notes (Addendum)
Pt present from PCP for sob x1 mo. At PCP today, MD reports wheezing, RR30,  and spO2 at 82%  on RA, improved to 97% on 2 with EMS. Recent diagnosis interstitial lung disease, h/o RA, HTN. Denies N/V, syncope, pain.  EMS VS 170/80, HR100, RR18, 99% on 2L.

## 2020-04-02 ENCOUNTER — Telehealth: Payer: Self-pay

## 2020-04-02 ENCOUNTER — Inpatient Hospital Stay (HOSPITAL_COMMUNITY): Payer: Medicare Other

## 2020-04-02 DIAGNOSIS — R609 Edema, unspecified: Secondary | ICD-10-CM

## 2020-04-02 DIAGNOSIS — I2699 Other pulmonary embolism without acute cor pulmonale: Secondary | ICD-10-CM | POA: Diagnosis not present

## 2020-04-02 LAB — BASIC METABOLIC PANEL
Anion gap: 10 (ref 5–15)
BUN: 8 mg/dL (ref 8–23)
CO2: 24 mmol/L (ref 22–32)
Calcium: 8.6 mg/dL — ABNORMAL LOW (ref 8.9–10.3)
Chloride: 106 mmol/L (ref 98–111)
Creatinine, Ser: 0.59 mg/dL (ref 0.44–1.00)
GFR calc Af Amer: >60 mL/min (ref >60)
GFR calc non Af Amer: >60 mL/min (ref >60)
Glucose, Bld: 103 mg/dL — ABNORMAL HIGH (ref 70–99)
Potassium: 3.7 mmol/L (ref 3.5–5.1)
Sodium: 140 mmol/L (ref 135–145)

## 2020-04-02 LAB — CBC
HCT: 40.1 % (ref 36.0–46.0)
Hemoglobin: 12.8 g/dL (ref 12.0–15.0)
MCH: 31 pg (ref 26.0–34.0)
MCHC: 31.9 g/dL (ref 30.0–36.0)
MCV: 97.1 fL (ref 80.0–100.0)
Platelets: 171 10*3/uL (ref 150–400)
RBC: 4.13 MIL/uL (ref 3.87–5.11)
RDW: 13.7 % (ref 11.5–15.5)
WBC: 8.6 10*3/uL (ref 4.0–10.5)
nRBC: 0 % (ref 0.0–0.2)

## 2020-04-02 LAB — HEPARIN LEVEL (UNFRACTIONATED)
Heparin Unfractionated: 0.66 IU/mL (ref 0.30–0.70)
Heparin Unfractionated: 0.68 IU/mL (ref 0.30–0.70)

## 2020-04-02 MED ORDER — RIVAROXABAN 15 MG PO TABS
15.0000 mg | ORAL_TABLET | Freq: Two times a day (BID) | ORAL | Status: DC
  Administered 2020-04-02: 15 mg via ORAL
  Filled 2020-04-02: qty 1

## 2020-04-02 MED ORDER — RIVAROXABAN (XARELTO) VTE STARTER PACK (15 & 20 MG)
ORAL_TABLET | ORAL | 0 refills | Status: DC
Start: 2020-04-02 — End: 2020-04-15

## 2020-04-02 MED ORDER — RIVAROXABAN 20 MG PO TABS: 20.0000 mg | ORAL_TABLET | Freq: Every day | ORAL | Status: DC

## 2020-04-02 MED ORDER — DOCUSATE SODIUM 100 MG PO CAPS: 100.0000 mg | ORAL_CAPSULE | Freq: Two times a day (BID) | ORAL | 0 refills | Status: DC

## 2020-04-02 MED ORDER — POLYETHYLENE GLYCOL 3350 17 G PO PACK: 17.0000 g | PACK | Freq: Every day | ORAL | 0 refills | Status: DC | PRN

## 2020-04-02 MED FILL — DOCUSATE SODIUM 100 MG CAPS: 100 | 5 days supply | Qty: 10 | Fill #0

## 2020-04-02 MED FILL — POLYETHYLENE GLYCOL 3350 PO: 17 | 14 days supply | Qty: 238 | Fill #0

## 2020-04-02 MED FILL — XARELTO STARTER PACK: 15 & 20 | 30 days supply | Qty: 51 | Fill #0

## 2020-04-02 NOTE — Care Management CC44 (Signed)
Condition Code 44 Documentation Completed  Patient Details  Name: JABRIA LOOS MRN: [id number] Date of Birth: 02-03-1941   Condition Code 44 given:  Yes Patient signature on Condition Code 44 notice:  Yes Documentation of 2 MD's agreement:  Yes Code 44 added to claim:  Yes    Dawayne Patricia, RN 04/02/2020, 5:22 PM

## 2020-04-02 NOTE — TOC Transition Note (Addendum)
Transition of Care (TOC) - CM/SW Discharge Note Rachel Gibbons RN, BSN Transitions of Care Unit 4E- RN Case Manager See Treatment Team for direct phone #    Patient Details  Name: [name] MRN: [id number] Date of Birth: 1941/04/22  Transition of Care [facility]) CM/SW Contact:  Rachel Patricia, RN Phone Number: 04/02/2020, 5:29 PM   Clinical Narrative:    Pt stable for transition home today, will start on Xarelto - per benefits check copay $60, HHPT and DME home 02 orders also placed.  CM in to speak with pt and daughter at bedside- discussed Xarelto- daughter requesting to fill here- will have MD send to [place] to fill and deliver 30 day free to bedside prior to discharge-  Also discussed home 02 and HH needs- [employer] has been called for home 02 needs- and order in process- per daughter they have already contacted them regarding delivery. Portable concentrator and tank to be delivered to room prior to discharge. Choice offered for HHPT with list provided Per CMS guidelines from medicare.gov website with star ratings (copy placed in shadow chart)- pt at first no sure she wanted [facility] services but agreeable to referral. States she has used [facility] in past- and would like to use them again, [place] as second choice with no preference after that. Call made to [place] with [facility] for HHPT referral- however due to current delay in start of care for PT they are unable to accept referral and feel it best for pt if she uses someone else for services. As it is after 5pm- will contact [facility] in am regarding referral- pt and daughter informed and agreeable to this.- CM will f/u with pt at home once [facility] secured.   Update post discharge 6/30- 1200- Call made to [facility] this AM regarding HHPT referral- spoke with [name]- they are able to accept referral however due to upcoming July 4 holiday will have a delay start of care until next Tues July 6. - Call made to patient to see if this is ok- per pt she is ok  with the delayed start of care and will be looking for [facility] to call next Tues to set up home visit. - notified [name] with [facility] that pt is ok with delay start of care- referral for HHPT accepted by [facility]   Final next level of care: [place] Barriers to Discharge: No Barriers Identified   Patient Goals and CMS Choice Patient states their goals for this hospitalization and ongoing recovery are:: return home and get rid of oxygen CMS Medicare.gov Compare Post Acute Care list provided to:: Patient Choice offered to / list presented to : Patient, Adult Children  Discharge Placement               Home with [facility]        Discharge Plan and Services   Discharge Planning Services: CM Consult Post Acute Care Choice: Home Health          DME Arranged: Oxygen DME Agency: [facility] Date DME Agency Contacted: 04/02/20 Time DME Agency Contacted: [id number] Representative spoke with at DME Agency: [place]: PT [place]: [facility] [employer] Date [place]: 04/02/20 Time [place]: [place] Representative spoke with at [place]: [place] ([place]) Interventions     Readmission Risk Interventions Readmission Risk Prevention Plan 04/02/2020  Post Dischage Appt Complete  Medication Screening Complete  Transportation Screening Complete  Some  recent data might be hidden

## 2020-04-02 NOTE — Progress Notes (Signed)
ANTICOAGULATION CONSULT NOTE - Follow Up Consult  Pharmacy Consult for heparin Indication: pulmonary embolus  Labs: Recent Labs    04/01/20 1204 04/01/20 1404 04/01/20 2045 04/02/20 0418  HGB 13.9  --   --  12.8  HCT 42.8  --   --  40.1  PLT 180  --   --  171  HEPARINUNFRC  --   --  0.24* 0.66  CREATININE 0.69  --   --  0.59  TROPONINIHS 34* 32*  --   --     Assessment/Plan:  79yo female therapeutic on heparin after rate change. Will continue gtt at current rate and confirm stable with additional level.   Wynona Neat, PharmD, BCPS  04/02/2020,6:28 AM

## 2020-04-02 NOTE — Progress Notes (Signed)
Lower extremity venous has been completed.   Preliminary results in CV Proc.   [name] 04/02/2020 2:07 PM

## 2020-04-02 NOTE — Progress Notes (Addendum)
ANTICOAGULATION CONSULT NOTE - Initial Consult  Pharmacy Consult for heparin >> rivaroxaban Indication: pulmonary embolus 04/01/20  Allergies  Allergen Reactions  . Codeine     REACTION: Nausea and vomiting  . Hydrocodone-Homatropine Other (See Comments)    hallucinations    Patient Measurements: Height: 5' 4.02" (162.6 cm) Weight: 92 kg (202 lb 13.2 oz) IBW/kg (Calculated) : 54.74 Heparin Dosing Weight: 75kg  Vital Signs: Temp: 98.5 F (36.9 C) (06/29 1157) Temp Source: Oral (06/29 1157) BP: 140/83 (06/29 1157) Pulse Rate: 69 (06/29 1157)  Labs: Recent Labs    04/01/20 1204 04/01/20 1404 04/01/20 2045 04/02/20 0418 04/02/20 1028  HGB 13.9  --   --  12.8  --   HCT 42.8  --   --  40.1  --   PLT 180  --   --  171  --   HEPARINUNFRC  --   --  0.24* 0.66 0.68  CREATININE 0.69  --   --  0.59  --   TROPONINIHS 34* 32*  --   --   --     Estimated Creatinine Clearance: 60.6 mL/min (by C-G formula based on SCr of 0.59 mg/dL).  Assessment: 79 yo W with acute PE, no RHS on 04/01/20. Pharmacy consulted for heparin. No AC PTA. Planned transition to [place].  HL at higher end of goal, decrease slightly to keep in goal. H/H dropped some, likely due to dilution, plts ok. No bleeding or bruising.    Goal of Therapy:  Heparin level 0.3-0.7 units/ml Monitor platelets by anticoagulation protocol: Yes   Plan:  Decrease heparin to 1350 units/hr Monitor daily HL, CBC/plt Monitor for signs/symptoms of bleeding  F/u transition to DOAC  ================================================= ADDENDUM 14:45: Switching from heparin infusion to Rivaroxaban per MD.   Plan: Start rivaroxaban 15mg  BID for 21 days today at dinner ~1700 Stop Heparin at 1700  Decrease rivaroxban to 20mg  with dinner on 04/23/20 Monitor for signs/symptoms of bleeding  Education done, AVS in, patient would like to fill discharge Rx at [facility], PharmD, BCPS, [place]  Pharmacist  Please check AMION for all [place] phone numbers After 10:00 PM, call [place]

## 2020-04-02 NOTE — TOC Benefit Eligibility Note (Signed)
Transition of Care [facility]) Benefit Eligibility Note    Patient Details  Name: [name] MRN: [id number] Date of Birth: 07/03/1941   Medication/Dose: ELIXIR  5 MG BID   CO-PAY- $60.00   and   XARELTO  20 MG  DAILY   CO-PAY- $60.00  Covered?: Yes  Tier: 2 Drug  Prescription Coverage Preferred Pharmacy: [facility] with Person/Company/Phone Number:: YCXK  @  [employer] # [phone]  Co-Pay: $ 60.00  Prior Approval: No  Deductible: Met       Memory Argue Phone Number: 04/02/2020, 11:28 AM

## 2020-04-02 NOTE — Care Management Obs Status (Signed)
[place] NOTIFICATION   Patient Details  Name: SAADIYA WILFONG MRN: [id number] Date of Birth: Sep 18, 1941   Medicare Observation Status Notification Given:  Yes    Dawayne Patricia, RN 04/02/2020, 5:22 PM

## 2020-04-02 NOTE — Progress Notes (Signed)
SATURATION QUALIFICATIONS: (This note is used to comply with regulatory documentation for home oxygen)  Patient Saturations on Room Air at Rest = 87%  Patient Saturations on Room Air while Ambulating = 85%  Patient Saturations on 2 Liters of oxygen while Ambulating = 91%  Please briefly explain why patient needs home oxygen: Pt has bilateral PE's

## 2020-04-02 NOTE — Discharge Instructions (Addendum)
Information on my medicine - XARELTO (rivaroxaban)  This medication education was reviewed with me or my healthcare representative as part of my discharge preparation.     WHY WAS XARELTO PRESCRIBED FOR YOU? 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  What do you need to know about Xarelto? The starting dose is one 15 mg tablet taken TWICE daily with food for the FIRST 21 DAYS then on (enter date)  04/23/20 Evening  the dose is changed to one 20 mg tablet taken ONCE A DAY with your evening meal.  DO NOT stop taking Xarelto without talking to the health care provider who prescribed the medication.  Refill your prescription for 20 mg tablets before you run out.  After discharge, you should have regular check-up appointments with your healthcare provider that is prescribing your Xarelto.  In the future your dose may need to be changed if your kidney function changes by a significant amount.  What do you do if you miss a dose? If you are taking Xarelto TWICE DAILY and you miss a dose, take it as soon as you remember. You may take two 15 mg tablets (total 30 mg) at the same time then resume your regularly scheduled 15 mg twice daily the next day.  If you are taking Xarelto ONCE DAILY and you miss a dose, take it as soon as you remember on the same day then continue your regularly scheduled once daily regimen the next day. Do not take two doses of Xarelto at the same time.   Important Safety Information Xarelto is a blood thinner medicine that can cause bleeding. You should call your healthcare provider right away if you experience any of the following: ? Bleeding from an injury or your nose that does not stop. ? Unusual colored urine (red or dark brown) or unusual colored stools (red or black). ? Unusual bruising for unknown  reasons. ? A serious fall or if you hit your head (even if there is no bleeding).  Some medicines may interact with Xarelto and might increase your risk of bleeding while on Xarelto. To help avoid this, consult your healthcare provider or pharmacist prior to using any new prescription or non-prescription medications, including herbals, vitamins, non-steroidal anti-inflammatory drugs (NSAIDs) and supplements.  This website has more information on Xarelto: [email or web address]-benson.[email or web address]   =========================================================  Pulmonary Embolism    A pulmonary embolism (PE) is a sudden blockage or decrease of blood flow in one or both lungs. Most blockages come from a blood clot that forms in the vein of a lower leg, thigh, or arm (deep vein thrombosis, DVT) and travels to the lungs. A clot is blood that has thickened into a gel or solid. PE is a dangerous and life-threatening condition that needs to be treated right away.  What are the causes? This condition is usually caused by a blood clot that forms in a vein and moves to the lungs. In rare cases, it may be caused by air, fat, part of a tumor, or other tissue that moves through the veins and into the lungs.  What increases the risk? The following factors may make you more likely to develop this condition: 1. Experiencing a traumatic injury, such as breaking a hip or leg. 2. Having: ? A spinal cord injury. ? Orthopedic surgery, especially hip or knee replacement. ?  Any major surgery. ? A stroke. ? DVT. ? Blood clots or blood clotting disease. ? Long-term (chronic) lung or heart disease. ? Cancer treated with chemotherapy. ? A central venous catheter. 3. Taking medicines that contain estrogen. These include birth control pills and hormone replacement therapy. 4. Being: ? Pregnant. ? In the period of time after your baby is delivered (postpartum). ? Older than age 8. ? Overweight. ? A smoker, especially if you have other  risks.  What are the signs or symptoms? Symptoms of this condition usually start suddenly and include:  Shortness of breath during activity or at rest.  Coughing, coughing up blood, or coughing up blood-tinged mucus.  Chest pain that is often worse with deep breaths.  Rapid or irregular heartbeat.  Feeling light-headed or dizzy.  Fainting.  Feeling anxious.  Fever.  Sweating.  Pain and swelling in a leg. This is a symptom of DVT, which can lead to PE. How is this diagnosed? This condition may be diagnosed based on:  Your medical history.  A physical exam.  Blood tests.  CT pulmonary angiogram. This test checks blood flow in and around your lungs.  Ventilation-perfusion scan, also called a lung VQ scan. This test measures air flow and blood flow to the lungs.  An ultrasound of the legs.  How is this treated? Treatment for this condition depends on many factors, such as the cause of your PE, your risk for bleeding or developing more clots, and other medical conditions you have. Treatment aims to remove, dissolve, or stop blood clots from forming or growing larger. Treatment may include: 1. Medicines, such as: ? Blood thinning medicines (anticoagulants) to stop clots from forming. ? Medicines that dissolve clots (thrombolytics). 2. Procedures, such as: ? Using a flexible tube to remove a blood clot (embolectomy) or to deliver medicine to destroy it (catheter-directed thrombolysis). ? Inserting a filter into a large vein that carries blood to the heart (inferior vena cava). This filter (vena cava filter) catches blood clots before they reach the lungs. ? Surgery to remove the clot (surgical embolectomy). This is rare. You may need a combination of immediate, long-term (up to 3 months after diagnosis), and extended (more than 3 months after diagnosis) treatments. Your treatment may continue for several months (maintenance therapy). You and your health care provider will  work together to choose the treatment program that is best for you.  Follow these instructions at home: Medicines 1. Take over-the-counter and prescription medicines only as told by your health care provider. 2. If you are taking an anticoagulant medicine: ? Take the medicine every day at the same time each day. ? Understand what foods and drugs interact with your medicine. ? Understand the side effects of this medicine, including excessive bruising or bleeding. Ask your health care provider or pharmacist about other side effects.  General instructions  Wear a medical alert bracelet or carry a medical alert card that says you have had a PE and lists what medicines you take.  Ask your health care provider when you may return to your normal activities. Avoid sitting or lying for a long time without moving.  Maintain a healthy weight. Ask your health care provider what weight is healthy for you.  Do not use any products that contain nicotine or tobacco, such as cigarettes, e-cigarettes, and chewing tobacco. If you need help quitting, ask your health care provider.  Talk with your health care provider about any travel plans. It is important to make sure  that you are still able to take your medicine while on trips.  Keep all follow-up visits as told by your health care provider. This is important.  Contact a health care provider if:  You missed a dose of your blood thinner medicine.  Get help right away if: 1. You have: ? New or increased pain, swelling, warmth, or redness in an arm or leg. ? Numbness or tingling in an arm or leg. ? Shortness of breath during activity or at rest. ? A fever. ? Chest pain. ? A rapid or irregular heartbeat. ? A severe headache. ? Vision changes. ? A serious fall or accident, or you hit your head. ? Stomach (abdominal) pain. ? Blood in your vomit, stool, or urine. ? A cut that will not stop bleeding. 2. You cough up blood. 3. You feel light-headed  or dizzy. 4. You cannot move your arms or legs. 5. You are confused or have memory loss.  These symptoms may represent a serious problem that is an emergency. Do not wait to see if the symptoms will go away. Get medical help right away. Call your local emergency services (911 in the [place] Do not drive yourself to the hospital. Summary  A pulmonary embolism (PE) is a sudden blockage or decrease of blood flow in one or both lungs. PE is a dangerous and life-threatening condition that needs to be treated right away.  Treatments for this condition usually include medicines to thin your blood (anticoagulants) or medicines to break apart blood clots (thrombolytics).  If you are given blood thinners, it is important to take the medicine every day at the same time each day.  Understand what foods and drugs interact with any medicines that you are taking.  If you have signs of PE or DVT, call your local emergency services (911 in the [place] This information is not intended to replace advice given to you by your health care provider. Make sure you discuss any questions you have with your health care provider. Document Revised: 06/29/2018 Document Reviewed: 06/29/2018 Elsevier Patient Education  2020 [employer].

## 2020-04-02 NOTE — Telephone Encounter (Signed)
[place] Night - Client Nonclinical Telephone Record AccessNurse Client [facility] Night - Client Client Site [place] - Night Contact Type Call Who Is Calling Patient / Member / Family / Caregiver Caller Name [place] Phone Number [phone] Patient Name [name] Patient DOB 06*/25/1942 Call Type Message Only Information Provided Reason for Call Request for General Office Information Initial Comment caller states her mother was in the office with shortness of breath and was taken to [facility] by ambulance -- the care is still in the parking lot- which a green suburu and she ask that it now be towed as they will get it tomorrow Additional Comment Disp. Time Disposition Final User 04/01/2020 5:41:33 PM General Information Provided Yes Kenton Kingfisher, Lanette Call Closed By: Nelia Shi Transaction Date/Time: 04/01/2020 5:38:00 PM (ET)

## 2020-04-02 NOTE — Progress Notes (Signed)
[facility] called at 05:33 to tell me Patient had 4 bts v-tach at 02:14

## 2020-04-03 NOTE — Telephone Encounter (Signed)
Noted, we will not tow her vehicle.    Thanks for letting us know.

## 2020-04-04 ENCOUNTER — Inpatient Hospital Stay: Admission: RE | Admit: 2020-04-04 | Payer: Medicare Other | Source: Ambulatory Visit

## 2020-04-04 ENCOUNTER — Telehealth: Payer: Self-pay | Admitting: Hematology and Oncology

## 2020-04-04 NOTE — Telephone Encounter (Signed)
Scheduled appt per 7/1 sch msg - pt is aware of appt date and time

## 2020-04-04 NOTE — Discharge Summary (Signed)
[facility] Discharge Summary   Patient: [name] KXF:[id number]  PCP: [name], MD  Date of admission: 04/01/2020   Date of discharge: 04/02/2020      Discharge Diagnoses:  Principal Problem:   Bilateral pulmonary embolism ([place]) Active Problems:   Hyperlipidemia   Essential hypertension, benign   Prediabetes   Severe obesity with body mass index (BMI) of 36.0 to 36.9 with serious comorbidity ([place])   Rheumatoid arthritis ([place])   Ductal carcinoma in situ (DCIS) of right breast   Admitted From: Home Disposition:  Home with home health  Recommendations for Outpatient Follow-up:  1. PCP: Follow-up with PCP in 1 week.  Follow-up with Dr. Lindi Adie 2. Follow up LABS/TEST:  none   Follow-up Information    [facility] Follow up.   Why: home 02 arranged- portable equipment to be delivered to room for transport home prior to discharge Contact information: [place] [address] [phone]        [name], MD. Schedule an appointment as soon as possible for a visit in 1 week(s).   Specialty: Family Medicine Contact information: [place] [address] [phone]        Nicholas Lose, MD. Schedule an appointment as soon as possible for a visit in 1 month(s).   Specialty: Hematology and Oncology Contact information: [place] [address] [phone]              Diet recommendation: Cardiac diet  Activity: The patient is advised to gradually reintroduce usual activities, as tolerated  Discharge Condition: stable  Code Status: Full code   History of present illness: As per the H and P dictated on admission, "[name] is a 79 y.o. female with medical history significant of obesity (BMI 35.96); HTN; HLD; ILD; RA and breast cancer presenting with SOB.  She reports that for the last few months she has been taking Humira.  She takes the shot on Saturday  and Monday-Tuesday in the middle of the night she wakes up feeling like she is having a heart attack and then it goes away. On Friday, she called her doctor's office and reported this and they thought it was not this.  She spoke with her cardiologist on Friday.  He encouraged a CT, scheduled for Thursday.  They encouraged her to f/u with her pulmonary doctor - who she saw 1 time several years ago.  Last night she went to go to the bathroom - as short distance but she was terribly SOB.  She finally went back to sleep.  She got up this morning and went for her daily walk and wasn't able to go and so she went back home and called her PCP.  They advised her to go to the ER and she refused; she went to her PCP and "I think I scared him."    She has not been as mobile as usual because of severe fatigue, particularly in the last month.  She had breast cancer surgery on 12/16.  She went for radiation, completed in March.  In the last 2 weeks, she walks the dog, does a few chores, and falls back asleep in the chair.  No recent orthopedic injuries.  No difference between legs.  She is on a mild diuretic and she has incontinence, which is very frustrating for her.  She has completed treatment for her breast cancer, no chemo."  Hospital Course:  Summary of  her active problems in the hospital is as following. Bilateral acute pulmonary embolism -Patient without prior episodes of thromboembolic disease presenting with new diffuse B PE Suspect related to her recent drive to the [facility] thus provoked. -Possibly related to recent malignancy, although not advanced cancer and so does not appear to currently need lifelong therapy Started with IV heparin. Discussed anticoagulation option p.o. Given that she has history of breast cancer she would be better served by being on Xarelto. Discussed with oncology who agrees with the same. Patient will follow up with Dr. Lindi Adie regarding further anticoagulation  discussion. Echocardiogram negative for any right heart strain or RV failure. Lower extremity Doppler negative for any DVT.  Breast CA -12/16 B lumpectomies with R nipple adenoma with intraductal papilloma and DCIS with clear margins -She had subsequent radiation therapy from 2/23-3/16 -She decided not to receive antiestrogen therapy -She is due for mammogram in 9/21 and f/u with oncology in 3/22  RA -She recently changed from Enbrel to Humira -Humira is associated with VTE <5% of the time - but there is a slight risk that this was the issue -Will defer to rheum/hematology/PCP about whether to continue  HTN -She reports significant frustration with HCTZ therapy (urinary incontinence)  HLD -she does not appear to be taking medications for this issue at this time  Pre-diabetes -Glucose minimally elevated at this time -May be stress response  Obesity Body mass index is 34.8 kg/m.  -Weight loss should be encouraged  Patient was seen by physical therapy, who recommended Home health, which was arranged. On the day of the discharge the patient's vitals were stable, and no other acute medical condition were reported by patient. the patient was felt safe to be discharge at Home with Home health.  Consultants: none Procedures: Echocardiogram  Vascular Doppler lower extremity  Discharge Exam: General: Appear in mild distress, no Rash; Oral Mucosa Clear, moist. Cardiovascular: S1 and S2 Present, no Murmur, Respiratory: Increased respiratory effort, Bilateral Air entry present and no Crackles, no wheezes Abdomen: Bowel Sound present, Soft and no tenderness, no hernia Extremities: bilateral  Pedal edema, no calf tenderness Neurology: alert and oriented to time, place, and person affect appropriate.  Filed Weights   04/01/20 2140  Weight: 92 kg   Vitals:   04/02/20 1000 04/02/20 1157  BP: (!) 158/82 140/83  Pulse: 77 69  Resp: 18 (!) 22  Temp:  98.5 F (36.9 C)  SpO2:  98% 95%    DISCHARGE MEDICATION: Allergies as of 04/02/2020      Reactions   Codeine    REACTION: Nausea and vomiting   Hydrocodone-homatropine Other (See Comments)   hallucinations      Medication List    TAKE these medications   acetaminophen 500 MG tablet Commonly known as: TYLENOL Take 1,000 mg by mouth 3 (three) times daily.   docusate sodium 100 MG capsule Commonly known as: COLACE Take 1 capsule (100 mg total) by mouth 2 (two) times daily.   folic acid 1 MG tablet Commonly known as: FOLVITE Take 1 mg by mouth daily.   HUMIRA PEN [place] Inject 40 mg into the skin every 14 (fourteen) days. Notes to patient: Take as directed    hydrochlorothiazide 25 MG tablet Commonly known as: HYDRODIURIL Take 12.5 mg by mouth every other day. Notes to patient: Take as directed    mometasone 0.1 % ointment Commonly known as: ELOCON Apply topically 2 (two) times daily.   polyethylene glycol 17 g packet Commonly known  as: MIRALAX / GLYCOLAX Take 17 g by mouth daily as needed for mild constipation.   Rivaroxaban Stater Pack (15 mg and 20 mg) Commonly known as: XARELTO STARTER PACK Follow package directions: Take one 15mg  tablet by mouth twice a day. On day 22, switch to one 20mg  tablet once a day. Take with food. Notes to patient: Take as directed       Allergies  Allergen Reactions  . Codeine     REACTION: Nausea and vomiting  . Hydrocodone-Homatropine Other (See Comments)    hallucinations   Discharge Instructions    Diet - low sodium heart healthy   Complete by: As directed    Increase activity slowly   Complete by: As directed       The results of significant diagnostics from this hospitalization (including imaging, microbiology, ancillary and laboratory) are listed below for reference.    Significant Diagnostic Studies: CT Angio Chest PE W/Cm &/Or Wo Cm  Result Date: 04/01/2020 CLINICAL DATA:  Shortness of breath for 1 month. Decreased oxygen saturation.  Wheezing. EXAM: CT ANGIOGRAPHY CHEST WITH CONTRAST TECHNIQUE: Multidetector CT imaging of the chest was performed using the standard protocol during bolus administration of intravenous contrast. Multiplanar CT image reconstructions and MIPs were obtained to evaluate the vascular anatomy. CONTRAST:  100 mL OMNIPAQUE IOHEXOL 350 MG/ML SOLN COMPARISON:  Single-view of the chest today.  CT chest 03/10/2018. FINDINGS: Cardiovascular: The patient has bilateral pulmonary emboli with clot seen in both main pulmonary arteries, segmental branches bilaterally and the ascending and descending interlobar pulmonary arteries on the left. There is no evidence of right heart strain. No pericardial effusion. Calcific aortic and coronary atherosclerosis is noted. Mediastinum/Nodes: No enlarged mediastinal, hilar, or axillary lymph nodes. Thyroid gland, trachea, and esophagus demonstrate no significant findings. Lungs/Pleura: A 0.4 cm right upper lobe nodule on image 45 abuts the minor fissure and is new since the prior exam. Mild dependent atelectasis is present. No pulmonary infarct or consolidative process. No pleural effusion. Upper Abdomen: 2.1 cm stone in the gallbladder is identified. No evidence of cholecystitis. Fatty infiltration of the liver is noted. Musculoskeletal: No acute or focal bony abnormality. Marked cervical spondylosis is partially imaged. Review of the MIP images confirms the above findings. IMPRESSION: The examination is positive for pulmonary embolus without evidence of right heart strain. 0.4 cm right upper lobe pulmonary nodule is new since the prior CT. No follow-up needed if patient is low-risk. Non-contrast chest CT can be considered in 12 months if patient is high-risk. This recommendation follows the consensus statement: Guidelines for Management of Incidental Pulmonary Nodules Detected on CT Images: From the Fleischner Society 2017; Radiology 2017; 284:228-243. Fatty infiltration of the liver.  Gallstone without evidence of cholecystitis. Aortic Atherosclerosis (ICD10-I70.0). Critical Value/emergent results were called by telephone at the time of interpretation on 04/01/2020 at 2:00 pm to provider Gwenith Daily, RN, who verbally acknowledged these results. Electronically Signed   By: Inge Rise M.D.   On: 04/01/2020 14:06   DG Chest Port 1 View  Result Date: 04/01/2020 CLINICAL DATA:  Shortness of breath. EXAM: PORTABLE CHEST 1 VIEW COMPARISON:  PA and lateral chest 11/07/2015. FINDINGS: The lungs are clear. Heart size is normal. No pneumothorax or pleural fluid. No acute or focal bony abnormality. IMPRESSION: Negative chest. Electronically Signed   By: Inge Rise M.D.   On: 04/01/2020 12:41   ECHOCARDIOGRAM COMPLETE  Result Date: 04/01/2020    ECHOCARDIOGRAM REPORT   Patient Name:   [facility]  Date of Exam: 04/01/2020 Medical Rec #:  [id number]            Height:       64.0 in Accession #:    [id number]           Weight:       209.5 lb Date of Birth:  [birth year]            BSA:          1.995 m Patient Age:    79 years             BP:           155/85 mmHg Patient Gender: F                    HR:           76 bpm. Exam Location:  Inpatient Procedure: 2D Echo Indications:    pulmonary embolus 415.19  History:        Patient has prior history of Echocardiogram examinations, most                 recent 03/10/2018. Risk Factors:Dyslipidemia and Hypertension.  Sonographer:    Johny Chess Referring Phys: [id number] JENNIFER YATES  Sonographer Comments: Image acquisition challenging due to patient body habitus. IMPRESSIONS  1. Left ventricular ejection fraction, by estimation, is 55 to 60%. The left ventricle has normal function. The left ventricle has no regional wall motion abnormalities. Left ventricular diastolic parameters are consistent with Grade I diastolic dysfunction (impaired relaxation).  2. Right ventricular systolic function is normal. The right ventricular size is normal.  There is mildly elevated pulmonary artery systolic pressure.  3. The mitral valve is normal in structure. Mild mitral valve regurgitation.  4. The aortic valve is normal in structure. Aortic valve regurgitation is trivial. Comparison(s): The left ventricular function is unchanged. FINDINGS  Left Ventricle: Left ventricular ejection fraction, by estimation, is 55 to 60%. The left ventricle has normal function. The left ventricle has no regional wall motion abnormalities. The left ventricular internal cavity size was normal in size. There is  no left ventricular hypertrophy. Left ventricular diastolic parameters are consistent with Grade I diastolic dysfunction (impaired relaxation). Right Ventricle: The right ventricular size is normal. Right vetricular wall thickness was not assessed. Right ventricular systolic function is normal. There is mildly elevated pulmonary artery systolic pressure. The tricuspid regurgitant velocity is 3.08 m/s, and with an assumed right atrial pressure of 3 mmHg, the estimated right ventricular systolic pressure is 96.2 mmHg. Left Atrium: Left atrial size was normal in size. Right Atrium: Right atrial size was normal in size. Pericardium: There is no evidence of pericardial effusion. Mitral Valve: The mitral valve is normal in structure. Mild mitral valve regurgitation. Tricuspid Valve: The tricuspid valve is normal in structure. Tricuspid valve regurgitation is mild. Aortic Valve: The aortic valve is normal in structure. Aortic valve regurgitation is trivial. Pulmonic Valve: The pulmonic valve was not well visualized. Pulmonic valve regurgitation is not visualized. Aorta: The aortic root is normal in size and structure. IAS/Shunts: No atrial level shunt detected by color flow Doppler.  LEFT VENTRICLE PLAX 2D LVIDd:         5.40 cm  Diastology LVIDs:         4.00 cm  LV e' lateral:   8.05 cm/s LV PW:         0.90 cm  LV E/e' lateral: 7.9 LV IVS:  0.90 cm  LV e' medial:    5.66 cm/s  LVOT diam:     2.00 cm  LV E/e' medial:  11.2 LV SV:         47 LV SV Index:   24 LVOT Area:     3.14 cm  RIGHT VENTRICLE             IVC RV S prime:     10.20 cm/s  IVC diam: 1.60 cm TAPSE (M-mode): 1.7 cm LEFT ATRIUM             Index       RIGHT ATRIUM           Index LA diam:        3.50 cm 1.75 cm/m  RA Area:     12.70 cm LA Vol (A2C):   38.8 ml 19.45 ml/m RA Volume:   30.20 ml  15.14 ml/m LA Vol (A4C):   48.8 ml 24.46 ml/m LA Biplane Vol: 43.5 ml 21.80 ml/m  AORTIC VALVE LVOT Vmax:   79.40 cm/s LVOT Vmean:  51.900 cm/s LVOT VTI:    0.150 m MITRAL VALVE                TRICUSPID VALVE MV Area (PHT): 3.12 cm     TR Peak grad:   37.9 mmHg MV Decel Time: 243 msec     TR Vmax:        308.00 cm/s MV E velocity: 63.20 cm/s MV A velocity: 105.00 cm/s  SHUNTS MV E/A ratio:  0.60         Systemic VTI:  0.15 m                             Systemic Diam: 2.00 cm Dorris Carnes MD Electronically signed by Dorris Carnes MD Signature Date/Time: 04/01/2020/6:29:47 PM    Final    VAS [place] LOWER EXTREMITY VENOUS (DVT)  Result Date: 04/02/2020  Lower Venous DVTStudy Indications: Edema.  Comparison Study: no prior Performing Technologist: [name] RVS  Examination Guidelines: A complete evaluation includes B-mode imaging, spectral Doppler, color Doppler, and power Doppler as needed of all accessible portions of each vessel. Bilateral testing is considered an integral part of a complete examination. Limited examinations for reoccurring indications may be performed as noted. The reflux portion of the exam is performed with the patient in reverse Trendelenburg.  +---------+---------------+---------+-----------+----------+--------------+ RIGHT    CompressibilityPhasicitySpontaneityPropertiesThrombus Aging +---------+---------------+---------+-----------+----------+--------------+ CFV      Full           Yes      Yes                                  +---------+---------------+---------+-----------+----------+--------------+ SFJ      Full                                                        +---------+---------------+---------+-----------+----------+--------------+ FV Prox  Full                                                        +---------+---------------+---------+-----------+----------+--------------+  FV Mid   Full                                                        +---------+---------------+---------+-----------+----------+--------------+ FV DistalFull                                                        +---------+---------------+---------+-----------+----------+--------------+ PFV      Full                                                        +---------+---------------+---------+-----------+----------+--------------+ POP      Full           Yes      Yes                                 +---------+---------------+---------+-----------+----------+--------------+ PTV      Full                                                        +---------+---------------+---------+-----------+----------+--------------+ PERO     Full                                                        +---------+---------------+---------+-----------+----------+--------------+   +---------+---------------+---------+-----------+----------+--------------+ LEFT     CompressibilityPhasicitySpontaneityPropertiesThrombus Aging +---------+---------------+---------+-----------+----------+--------------+ CFV      Full           Yes      Yes                                 +---------+---------------+---------+-----------+----------+--------------+ SFJ      Full                                                        +---------+---------------+---------+-----------+----------+--------------+ FV Prox  Full                                                         +---------+---------------+---------+-----------+----------+--------------+ FV Mid   Full                                                        +---------+---------------+---------+-----------+----------+--------------+  FV DistalFull                                                        +---------+---------------+---------+-----------+----------+--------------+ PFV      Full                                                        +---------+---------------+---------+-----------+----------+--------------+ POP      Full           Yes      Yes                                 +---------+---------------+---------+-----------+----------+--------------+ PTV      Full                                                        +---------+---------------+---------+-----------+----------+--------------+ PERO     Full                                                        +---------+---------------+---------+-----------+----------+--------------+     Summary: BILATERAL: - No evidence of deep vein thrombosis seen in the lower extremities, bilaterally. -No evidence of popliteal cyst, bilaterally.   *See table(s) above for measurements and observations. Electronically signed by Deitra Mayo MD on 04/02/2020 at 3:33:38 PM.    Final     Microbiology: Recent Results (from the past 240 hour(s))  SARS Coronavirus 2 by RT PCR (hospital order, performed in [facility] hospital lab) Nasopharyngeal Nasopharyngeal Swab     Status: None   Collection Time: 04/01/20  1:08 PM   Specimen: Nasopharyngeal Swab  Result Value Ref Range Status   SARS Coronavirus 2 NEGATIVE NEGATIVE Final    Comment: (NOTE) SARS-CoV-2 target nucleic acids are NOT DETECTED.  The SARS-CoV-2 RNA is generally detectable in upper and lower respiratory specimens during the acute phase of infection. The lowest concentration of SARS-CoV-2 viral copies this assay can detect is 250 copies / mL. A negative result does  not preclude SARS-CoV-2 infection and should not be used as the sole basis for treatment or other patient management decisions.  A negative result may occur with improper specimen collection / handling, submission of specimen other than nasopharyngeal swab, presence of viral mutation(s) within the areas targeted by this assay, and inadequate number of viral copies (<250 copies / mL). A negative result must be combined with clinical observations, patient history, and epidemiological information.  Fact Sheet for Patients:   StrictlyIdeas.no  Fact Sheet for Healthcare Providers: BankingDealers.co.za  This test is not yet approved or  cleared by the [place] FDA and has been authorized for detection and/or diagnosis of SARS-CoV-2 by FDA under an Emergency Use Authorization (EUA).  This EUA will remain in effect (meaning this test can  be used) for the duration of the COVID-19 declaration under Section 564(b)(1) of the Act, 21 U.S.C. section 360bbb-3(b)(1), unless the authorization is terminated or revoked sooner.  Performed at [facility], [address], [place],  [address]      Labs: CBC: Recent Labs  Lab 04/01/20 1204 04/02/20 0418  WBC 9.1 8.6  NEUTROABS 4.9  --   HGB 13.9 12.8  HCT 42.8 40.1  MCV 97.3 97.1  PLT 180 633   Basic Metabolic Panel: Recent Labs  Lab 04/01/20 1204 04/02/20 0418  NA 141 140  K 3.9 3.7  CL 106 106  CO2 24 24  GLUCOSE 102* 103*  BUN 7* 8  CREATININE 0.69 0.59  CALCIUM 8.9 8.6*   Liver Function Tests: Recent Labs  Lab 04/01/20 1204  AST 28  ALT 28  ALKPHOS 79  BILITOT 0.5  PROT 7.9  ALBUMIN 3.7   No results for input(s): LIPASE, AMYLASE in the last 168 hours. No results for input(s): AMMONIA in the last 168 hours. Cardiac Enzymes: No results for input(s): CKTOTAL, CKMB, CKMBINDEX, TROPONINI in the last 168 hours. BNP (last 3 results) Recent Labs     04/01/20 1204  BNP 47.0   CBG: No results for input(s): GLUCAP in the last 168 hours.  Time spent: 35 minutes  Signed:  Berle Mull  [facility] 04/02/2020 8:30 AM

## 2020-04-05 ENCOUNTER — Telehealth: Payer: Self-pay

## 2020-04-05 NOTE — Telephone Encounter (Signed)
Left message for [name] giving verbal orders for [facility] PT 1 x  a week for 8 weeks.

## 2020-04-05 NOTE — Telephone Encounter (Signed)
Enid Derry PT with [facility] left v/m requesting verbal orders for [facility] PT 1 x a wk for 8 wks. Pt was discharged home from hospital on 04/02/20.

## 2020-04-14 NOTE — Progress Notes (Signed)
Patient Care Team: [name], MD as PCP - General Josue Hector, MD as PCP - Cardiology (Cardiology) Rosita Kea, PA-C as Physician Assistant (Rheumatology) Christain [place], [place] as Referring Physician (Optometry) Danella Sensing, MD as Consulting Physician (Dermatology) Mauro Kaufmann, RN as Oncology Nurse Navigator Rockwell Germany, RN as Oncology Nurse Navigator  DIAGNOSIS:    ICD-10-CM   1. Ductal carcinoma in situ (DCIS) of right breast  D05.11 Lupus anticoagulant panel    CT ANGIO CHEST PE W OR WO CONTRAST    SUMMARY OF ONCOLOGIC HISTORY: Oncology History  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/27/2019 Initial Diagnosis   Patient reported two weeks of spontaneous right nipple discharge. 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 Biopsy on 07/05/19 showed fibrocystic changes in the left breast, no evidence of malignancy. Breast MRI on 08/11/19 showed a 1.7cm right nipple base mass, a 0.9cm superior central right breast mass, and indeterminate non-mass enhancement in the central left breast. Biopsy on 08/24/19 showed no evidence of malignancy in the left breast, and in the right breast, DCIS with necrosis, high grade, ER+ 100%, PR+ 70%.    09/20/2019 Surgery   Bilateral lumpectomies ([facility]): Left breast: complex sclerosing lesion with no evidence of malignancy Right breast: nipple adenoma with intraductal papilloma and DCIS, intermediate grade, 0.4cm, clear margins.   11/28/2019 -  Radiation Therapy   Adjuvant radiation     CHIEF COMPLIANT: Follow-up of recent hospitalization for bilateral pulmonary emboli  INTERVAL HISTORY: [name] is a 79 y.o. with above-mentioned history of right breast DCIS treated with lumpectomy, radiation, and who declined antiestrogen therapy due to a history of superficial venous thrombosis. She  was admitted from 04/01/20-04/02/20 after presenting to the ED with shortness of breath. CT Angio showed bilateral pulmonary emboli without right heart strain. She was started on Heparin and transitioned to Xarelto. She presents to the clinic today for follow-up of her hospitalization and discussion of anticoagulation.   ALLERGIES:  is allergic to codeine and hydrocodone-homatropine.  MEDICATIONS:  Current Outpatient Medications  Medication Sig Dispense Refill  . acetaminophen (TYLENOL) 500 MG tablet Take 1,000 mg by mouth 3 (three) times daily.    . Adalimumab (HUMIRA PEN [place]) Inject 40 mg into the skin every 14 (fourteen) days.     Marland Kitchen docusate sodium (COLACE) 100 MG capsule Take 1 capsule (100 mg total) by mouth 2 (two) times daily. 10 capsule 0  . folic acid (FOLVITE) 1 MG tablet Take 1 mg by mouth daily.     . hydrochlorothiazide (HYDRODIURIL) 25 MG tablet Take 12.5 mg by mouth every other day.    . mometasone (ELOCON) 0.1 % ointment Apply topically 2 (two) times daily. 45 g 1  . polyethylene glycol (MIRALAX / GLYCOLAX) 17 g packet Take 17 g by mouth daily as needed for mild constipation. 14 each 0  . rivaroxaban (XARELTO) 20 MG TABS tablet Take 1 tablet (20 mg total) by mouth daily with supper. 30 tablet 6   No current facility-administered medications for this visit.    PHYSICAL EXAMINATION: ECOG PERFORMANCE STATUS: 1 - Symptomatic but completely ambulatory  Vitals:   04/15/20 1122 04/15/20 1134  BP: (!) 179/75   Pulse: 62   Resp: 18   Temp: 98.5 F (36.9 C)   SpO2: 98% 99%   [employer]  04/15/20 1122  Weight: 208 lb 12.8 oz (94.7 kg)    LABORATORY DATA:  I have reviewed the data as listed CMP Latest Ref Rng & Units 04/02/2020 04/01/2020 09/18/2019  Glucose 70 - 99 mg/dL 103(H) 102(H) 146(H)  BUN 8 - 23 mg/dL 8 7(L) 11  Creatinine 0.44 - 1.00 mg/dL 0.59 0.69 0.66  Sodium 135 - 145 mmol/L 140 141 140  Potassium 3.5 - 5.1 mmol/L 3.7 3.9 4.2  Chloride 98 - 111 mmol/L 106  106 104  CO2 22 - 32 mmol/L 24 24 26   Calcium 8.9 - 10.3 mg/dL 8.6(L) 8.9 9.0  Total Protein 6.5 - 8.1 g/dL - 7.9 7.1  Total Bilirubin 0.3 - 1.2 mg/dL - 0.5 0.3  Alkaline Phos 38 - 126 U/L - 79 74  AST 15 - 41 U/L - 28 30  ALT 0 - 44 U/L - 28 31    Lab Results  Component Value Date   WBC 8.6 04/02/2020   HGB 12.8 04/02/2020   HCT 40.1 04/02/2020   MCV 97.1 04/02/2020   PLT 171 04/02/2020   NEUTROABS 4.9 04/01/2020    ASSESSMENT & PLAN:  Ductal carcinoma in situ (DCIS) of right breast 09/10/2019:Bilateral lumpectomies ([facility]): Left breast: complex sclerosing lesion with no evidence of malignancy Right breast: nipple adenoma with intraductal papilloma and DCIS, intermediate grade, 0.4cm, clear margins.ER 100%, PR 70% Tis NX stage 0  Treatment plan: Adjuvant radiation 11/28/2019-12/19/2019 Patient decided not to take antiestrogen therapy because of her history of superficial venous thrombosis. Hospitalization 04/01/2020 04/02/2020: Bilateral PEs-(no clear-cut precipitating causes) Current treatment: Xarelto Takes Humira for rheumatoid arthritis  I assured her that she would not need oxygen for very long.  In fact within a month she may not require any oxygen at all.  Return to clinic in 6 months with CT scan and follow-up    Orders Placed This Encounter  Procedures  . CT ANGIO CHEST PE W OR WO CONTRAST    Standing Status:   Future    Standing Expiration Date:   04/15/2021    Order Specific Question:   ** REASON FOR EXAM (FREE TEXT)    Answer:   PE    Order Specific Question:   If indicated for the ordered procedure, I authorize the administration of contrast media per Radiology protocol    Answer:   Yes    Order Specific Question:   Preferred imaging location?    Answer:   [facility]    Order Specific Question:   Radiology Contrast Protocol - do NOT remove file path    Answer:   [email or web address]  . Lupus anticoagulant panel     Standing Status:   Future    Number of Occurrences:   1    Standing Expiration Date:   04/15/2021   The patient has a good understanding of the overall plan. she agrees with it. she will call with any problems that may develop before the next visit here.  Total time spent: 30 mins including face to face time and time spent for planning, charting and coordination of care  Nicholas Lose, MD 04/15/2020  I, Cloyde Reams Dorshimer, am acting as scribe for Dr. Nicholas Lose.  I have reviewed the above documentation for accuracy and completeness, and I agree with the above.

## 2020-04-15 ENCOUNTER — Inpatient Hospital Stay: Payer: Medicare Other | Attending: Hematology and Oncology | Admitting: Hematology and Oncology

## 2020-04-15 ENCOUNTER — Telehealth: Payer: Self-pay | Admitting: Hematology and Oncology

## 2020-04-15 ENCOUNTER — Other Ambulatory Visit: Payer: Self-pay

## 2020-04-15 ENCOUNTER — Inpatient Hospital Stay: Payer: Medicare Other

## 2020-04-15 DIAGNOSIS — Z923 Personal history of irradiation: Secondary | ICD-10-CM | POA: Diagnosis not present

## 2020-04-15 DIAGNOSIS — Z7901 Long term (current) use of anticoagulants: Secondary | ICD-10-CM | POA: Diagnosis not present

## 2020-04-15 DIAGNOSIS — Z86718 Personal history of other venous thrombosis and embolism: Secondary | ICD-10-CM | POA: Insufficient documentation

## 2020-04-15 DIAGNOSIS — D0511 Intraductal carcinoma in situ of right breast: Secondary | ICD-10-CM | POA: Diagnosis present

## 2020-04-15 DIAGNOSIS — I2699 Other pulmonary embolism without acute cor pulmonale: Secondary | ICD-10-CM | POA: Diagnosis not present

## 2020-04-15 MED ORDER — RIVAROXABAN 20 MG PO TABS
20.0000 mg | ORAL_TABLET | Freq: Every day | ORAL | 6 refills | Status: DC
Start: 2020-04-15 — End: 2020-04-23

## 2020-04-15 NOTE — Telephone Encounter (Signed)
Scheduled appts per 7/12 los. Gave pt a print out of AVS.

## 2020-04-15 NOTE — Assessment & Plan Note (Signed)
09/10/2019:Bilateral lumpectomies ([facility]): Left breast: complex sclerosing lesion with no evidence of malignancy Right breast: nipple adenoma with intraductal papilloma and DCIS, intermediate grade, 0.4cm, clear margins.ER 100%, PR 70% Tis NX stage 0  Treatment plan: Adjuvant radiation 11/28/2019-12/19/2019 Patient decided not to take antiestrogen therapy because of her history of superficial venous thrombosis. Hospitalization 04/01/2020 04/02/2020: Bilateral PEs-(no clear-cut precipitating causes) Current treatment: Xarelto Takes Humira for rheumatoid arthritis  Return to clinic in 1 year for follow-up

## 2020-04-17 ENCOUNTER — Telehealth: Payer: Self-pay | Admitting: Hematology and Oncology

## 2020-04-17 DIAGNOSIS — D0511 Intraductal carcinoma in situ of right breast: Secondary | ICD-10-CM

## 2020-04-17 DIAGNOSIS — I2699 Other pulmonary embolism without acute cor pulmonale: Secondary | ICD-10-CM

## 2020-04-17 LAB — LUPUS ANTICOAGULANT PANEL
DRVVT: 104.3 s — ABNORMAL HIGH (ref 0.0–47.0)
PTT Lupus Anticoagulant: 37.6 s (ref 0.0–51.9)

## 2020-04-17 LAB — DRVVT CONFIRM: dRVVT Confirm: 2.1 ratio — ABNORMAL HIGH (ref 0.8–1.2)

## 2020-04-17 LAB — DRVVT MIX: dRVVT Mix: 68.9 s — ABNORMAL HIGH (ref 0.0–40.4)

## 2020-04-17 NOTE — Telephone Encounter (Signed)
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 This could be the cause of her blood clots. In order to confirm antiphospholipid antibody syndrome, this test needs to be repeated and confirmed in 3 months. I will arrange for a 1-month follow-up with blood work done ahead of time. Patient understands that if the repeat test is also positive then she needs to be on blood thinners for life.

## 2020-04-18 ENCOUNTER — Telehealth: Payer: Self-pay | Admitting: Hematology and Oncology

## 2020-04-18 NOTE — Telephone Encounter (Signed)
Scheduled per 7/14 sch message. Unable to reach pt. Left voicemail with appt time and date.

## 2020-04-23 ENCOUNTER — Other Ambulatory Visit: Payer: Self-pay | Admitting: *Deleted

## 2020-04-23 MED ORDER — RIVAROXABAN 20 MG PO TABS: 20.0000 mg | ORAL_TABLET | Freq: Every day | ORAL | 6 refills | Status: DC

## 2020-04-24 ENCOUNTER — Encounter: Payer: Self-pay | Admitting: Internal Medicine

## 2020-04-24 ENCOUNTER — Other Ambulatory Visit: Payer: Self-pay

## 2020-04-24 ENCOUNTER — Ambulatory Visit (INDEPENDENT_AMBULATORY_CARE_PROVIDER_SITE_OTHER): Payer: Medicare Other | Admitting: Internal Medicine

## 2020-04-24 VITALS — BP 130/68 | HR 76 | Temp 98.1°F | Ht 64.0 in | Wt 209.4 lb

## 2020-04-24 DIAGNOSIS — R911 Solitary pulmonary nodule: Secondary | ICD-10-CM

## 2020-04-24 DIAGNOSIS — I2699 Other pulmonary embolism without acute cor pulmonale: Secondary | ICD-10-CM

## 2020-04-24 DIAGNOSIS — R0600 Dyspnea, unspecified: Secondary | ICD-10-CM

## 2020-04-24 DIAGNOSIS — J849 Interstitial pulmonary disease, unspecified: Secondary | ICD-10-CM

## 2020-04-24 DIAGNOSIS — R0609 Other forms of dyspnea: Secondary | ICD-10-CM

## 2020-04-24 NOTE — Patient Instructions (Addendum)
ICD-10-CM   1. Bilateral pulmonary embolism ([facility])  I26.99   2. Pulmonary nodule  R91.1   3. ILD (interstitial lung disease) ([place])  J84.9   4. Dyspnea on exertion  R06.00      You seem to be slowly improving from your recent blood clot There is a 4 mm lung nodule in June 2021  CT chet It is not clear whether he have interstitial lung disease or not  Plan -Continue Eliquis -monitor for bleeding   -Duration of treatment can be greater than 1 year  -monitor for bleeding risk -Refer to pulmonary rehabilitation if you are interested -At some point in the next 1 year we will repeat a high-resolution CT chest  Follow-up -4-8 weeks telephone visit with nurse practitioner to report progress with residual symptoms - then 6 months face to face visit - 15 min slot

## 2020-04-24 NOTE — Progress Notes (Signed)
HPI  PCP Rachel Sanders, MD  HPI  IOV 02/22/2018   [name] presents on behalf of [name] and his team for evaluation of shortness of breath and concern for possible interstitial lung disease seen on chest x-ray.  History is given by the patient and review of the chart including old medical records and visualization of the films mentioned.  According to the patient she was diagnosed with rheumatoid arthritis not otherwise specified approximately 3 years ago.  For the first year she was on methotrexate which caused weight gain and itchiness and this was stopped.  Subsequently was on leflunomide which caused diarrhea and 16 pound weight loss and therefore had to be stopped in 2018.  Early in 2019 she was started on Enbrel which she believes is causing weight gain although when I asked her if her weight gain was just a reflection of her not being on [name] she conceded that it might not be the Enbrel directly causing the weight gain.  Nevertheless she has had weight gain with a significant and documented below.  Associated with his weight gain has been insidious onset of shortness of breath with exertion relieved by rest.  She notices shortness of breath when walking up an incline compared to her peers and she has to stop to rest.  There is no associated chest pain or proximal nocturnal dyspnea or orthopnea wheezing or cough or edema.  She says the most of the time she does not subjectively feel the dyspnea but then she is noticed to be visibly dyspneic by the family or onlookers.  Such was the case when she walked in our office today when she got tachycardic without any desaturation.  American College of chest physicians interstitial lung disease questionnaire  Symptoms: She has occasional cough but not bothersome.  Does not cough at night does not wake her up.  She walks slower than people of her age.  Started 6 months ago  Past medical history: She says she has a "fat heart"  although 2013 echocardiogram that I reviewed was normal.  She denies any weight loss or dysphagia or heartburn or acid reflux or dry eyes or chest pain or ongoing arthralgia  Personal exposure history: She never smoked any recreational drugs.  She started smoking at age 40 smoked half a pack a day and quit when she was 89.  Family history of lung disease: This emphysema in her brother but no family history of pulmonary fibrosis  Home environment history: He does not old house.  There is no humidifier or insomnia or hot tub or Jacuzzi or water damage.  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  Simple office walk 185 feet x  3 laps goal with forehead probe 02/22/2018   O2 used Room air  Number laps completed 3 all laps  Comments about pace Normal pace  Resting Pulse Ox/HR 98% and 85/min  Final Pulse Ox/HR 97% and 121/min  Desaturated </= 88% no  Desaturated <= 3% points no  Got Tachycardic >/= 90/min yes  Symptoms at end of test Visibly Dyspneic and improved with rest. No chest pain or  cough but no subjective dyspnea  Miscellaneous comments none     OV 03/15/2018  Chief Complaint  Patient presents with  . Follow-up    Echo and HRCT performed 6/6 and PFT performed 6/7.  Pt states she has been doing well since last visit and denies any complaints.   [name] returns for follow-up after doing work-up for shortness of breath in the setting of rheumatoid arthritis with a specific question of ruling out interstitial lung disease   She returns with her husband as before.  This time she is accompanied by her  daughter who lives in [place], [place].  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  This back pain preceded the diagnosis of rheumatoid arthritis.  It is relieved by rest.  Her work-up shows normal pulmonary function test including DLCO but the radiologist did call for high-resolution CT chest findings of indeterminate for UIP interstitial lung disease.  There is also an additional 4 mm right upper lobe nodule.  Her echocardiogram itself is normal.  There is an additional finding of coronary artery calcification on the CT chest.  We discussed all this in detail.    IMPRESSION: HRCT 1. Findings do suggest interstitial lung disease, but at this time, the CT pattern is considered indeterminate for usual interstitial pneumonia (UIP). Repeat high-resolution chest CT is suggested in 12 months to assess for temporal changes in the appearance of the lung parenchyma. 2. 4 mm right upper lobe pulmonary nodule. This is nonspecific but statistically likely benign. Attention at time of repeat high-resolution chest CT is recommended. 3. Aortic atherosclerosis, in addition to left main and left anterior descending coronary artery disease. Assessment for potential risk factor modification, dietary therapy or pharmacologic therapy may be warranted, if clinically indicated. 4. Hepatic steatosis.  Aortic Atherosclerosis (ICD10-I70.0).   Electronically Signed   By: [name] M.D.   On: 03/10/2018 12:02  PFT   Results for [name] (MRN [id number]) as of 03/15/2018 10:03  Ref. Range 03/11/2018 11:38  FVC-Pre Latest Units: L 2.06  FVC-%Pred-Pre Latest Units: % 81  FEV1-Pre Latest Units: L 1.54  FEV1-%Pred-Pre Latest Units: % 81  Pre FEV1/FVC ratio Latest Units: % 75  FEV1FVC-%Pred-Pre Latest Units: % 100  FEF 25-75  Pre Latest Units: L/sec 1.11  FEF2575-%Pred-Pre Latest Units: % 79  FEV6-Pre Latest Units: L 2.06  FEV6-%Pred-Pre Latest Units: % 86  Pre FEV6/FVC Ratio Latest Units: % 100  FEV6FVC-%Pred-Pre Latest Units: % 106  DLCO unc Latest Units: ml/min/mmHg 18.80  DLCO unc % pred Latest Units: % 82  DL/VA Latest Units: ml/min/mmHg/L 4.78  DL/VA % pred Latest Units: % 102     Study Conclusions - ECHO 03/10/18  - Left ventricle: The cavity size was normal. Systolic function was   normal. The estimated ejection fraction was in the range of 60%   to 65%. Wall motion was normal; there were no regional wall   motion abnormalities. Doppler parameters are consistent with   abnormal left ventricular relaxation (grade 1 diastolic   dysfunction). Doppler parameters are consistent with elevated   ventricular end-diastolic filling pressure. - Aortic valve: There was no regurgitation. - Mitral valve: Calcified annulus. Mildly thickened leaflets .   There was mild regurgitation. - Right ventricle: The cavity size was normal. Wall thickness was   normal.  Systolic function was normal. - Pulmonary arteries: Systolic pressure was within the normal   range. - Inferior vena cava: The vessel was normal in size. - Pericardium, extracardiac: There was no pericardial effusion.   has a past medical history of Cataract, Dysrhythmia, GERD (gastroesophageal reflux disease), Hyperlipidemia, Hypertension, Migraines, Obesity, Osteoarthritis, and Phlebitis of leg, right, superficial (1986/1989).   reports that she quit smoking about 40 years ago.      OV 04/24/2020  Subjective:  Patient ID: [name], female , DOB: [birth year] , age 79 y.o. , MRN: [id number] , ADDRESS: [address] [place] [facility] [address]   04/24/2020 -   Chief Complaint  Patient presents with  . Follow-up    follow up from recent hospitalization for pulmonary embolism     HPI [name] 79 y.o. -presents with her daughter  for follow-up.  Not seen her since 2019.  Was supposed to repeat a high-resolution CT chest in case she has ILD or not.  The end of June 2021 after a trip to the mountains and also recently being sedentary she developed mild pulmonary embolism and was admitted.  She was admitted just for 1 day.  She was discharged on night oxygen which is not using.  She is on Eliquis which she is taking.  She says she is improved but she still has residual fatigue and dyspnea on exertion and some chest tightness.  No bleeding episodes.  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  Dr. Lindi Adie her oncologist has done some hypercoagulable work-up according to history.  Of note her CT chest did not show any evidence of right heart strain.  Shows right upper lobe nodule which is reported as new but the old CT also reports right upper lobe nodule.     Simple office walk 185 feet x  3 laps goal with forehead probe 02/22/2018  04/24/2020   O2 used Room air ra  Number laps completed 3 all laps 3  Comments about pace Normal pace Nl pace  Resting Pulse Ox/HR 98% and 85/min 99% and 76/min  Final Pulse Ox/HR 97% and 121/min 99% and 129/minn  Desaturated </= 88% no no  Desaturated <= 3% points no no  Got Tachycardic >/= 90/min yes yes  Symptoms at end of test Visibly Dyspneic and improved with rest. No chest pain or cough but no subjective dyspnea Denied dyspnea but visibly dyspenic to CMA  Miscellaneous comments none x    ECHO 04/01/20  Sonographer Comments: Image acquisition challenging due to patient body  habitus.  IMPRESSIONS   1. Left ventricular ejection fraction, by estimation, is 55 to 60%. The  left ventricle has normal function. The left ventricle has no regional  wall motion abnormalities. Left ventricular diastolic parameters are  consistent with Grade I diastolic  dysfunction (impaired relaxation).  2. Right ventricular systolic function is normal. The right ventricular  size is normal. There is mildly  elevated pulmonary artery systolic  pressure.  3. The mitral valve is normal in structure. Mild mitral valve  regurgitation.  4. The aortic valve is normal in structure. Aortic valve regurgitation is  trivial.   Comparison(s): The left ventricular function is unchanged.   ROS - per HPI  IMPRESSION: ct angio The examination is positive for pulmonary embolus without evidence of right heart strain.  0.4 cm right upper lobe pulmonary nodule is new since the prior CT. No follow-up needed if patient is low-risk. Non-contrast chest CT can be considered in 12 months  if patient is high-risk. This recommendation follows the consensus statement: Guidelines for Management of Incidental Pulmonary Nodules Detected on CT Images: From the Fleischner Society 2017; Radiology 2017; 284:228-243.  Fatty infiltration of the liver.  Gallstone without evidence of cholecystitis.  Aortic Atherosclerosis (ICD10-I70.0).  Critical Value/emergent results were called by telephone at the time of interpretation on 04/01/2020 at 2:00 pm to provider Gwenith Daily, RN, who verbally acknowledged these results.   has a past medical history of Cancer ([place]), Cataract, Dysrhythmia, GERD (gastroesophageal reflux disease), Hyperlipidemia, Hypertension, Migraines, Obesity, Osteoarthritis, and Phlebitis of leg, right, superficial (1986/1989).   reports that she quit smoking about 42 years ago. She has a 5.00 pack-year smoking history. She has never used smokeless tobacco.  Past Surgical History:  Procedure Laterality Date  . BREAST LUMPECTOMY WITH RADIOACTIVE SEED LOCALIZATION Bilateral 09/20/2019   Procedure: BILATERAL BREAST LUMPECTOMIES WITH BILATERAL RADIOACTIVE SEED LOCALIZATION AND RIGHT NIPPLE BIOPSY;  Surgeon: Erroll Luna, MD;  Location: [facility];  Service: General;  Laterality: Bilateral;  . CARDIAC CATHETERIZATION  1990s   negative  . CARDIOVASCULAR STRESS TEST  2008   NML  .  CARDIOVASCULAR STRESS TEST  02/02/2007   EF 70%, NO ISCHEMIA  . DILATION AND CURETTAGE, DIAGNOSTIC / THERAPEUTIC  2008  . LESION DESTRUCTION  10/2017   Face; Dr. Ronnald Ramp  . ROTATOR CUFF REPAIR  1999   Right, left in 2002  . 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 US ECHOCARDIOGRAPHY  02/07/2007   EF 55-60%    Allergies  Allergen Reactions  . Codeine     REACTION: Nausea and vomiting  . Hydrocodone-Homatropine Other (See Comments)    hallucinations    Immunization History  Administered Date(s) Administered  . Fluad Quad(high Dose 65+) 06/27/2019  . Influenza,inj,Quad PF,6+ Mos 08/05/2017, 08/04/2018  . PFIZER SARS-COV-2 Vaccination 10/20/2019, 11/10/2019  . Pneumococcal Conjugate-13 07/10/2014  . Pneumococcal Polysaccharide-23 01/04/2008  . Td 10/05/1996, 01/04/2008  . Zoster 02/01/2008    Family History  Problem Relation Age of Onset  . Heart attack Father 61       PUD  . Hypertension Father   . Lung cancer Brother   . Heart defect Sister   . Hypertension Mother   . Coronary artery disease Other        Female 1st degree relative <50     Current Outpatient Medications:  .  acetaminophen (TYLENOL) 500 MG tablet, Take 1,000 mg by mouth 3 (three) times daily., Disp: , Rfl:  .  Adalimumab (HUMIRA PEN [place]), Inject 40 mg into the skin every 14 (fourteen) days. , Disp: , Rfl:  .  docusate sodium (COLACE) 100 MG capsule, Take 1 capsule (100 mg total) by mouth 2 (two) times daily., Disp: 10 capsule, Rfl: 0 .  folic acid (FOLVITE) 1 MG tablet, Take 1 mg by mouth daily. , Disp: , Rfl:  .  hydrochlorothiazide (HYDRODIURIL) 25 MG tablet, Take 12.5 mg by mouth every other day., Disp: , Rfl:  .  mometasone (ELOCON) 0.1 % ointment, Apply topically 2 (two) times daily., Disp: 45 g, Rfl: 1 .  polyethylene glycol (MIRALAX /  GLYCOLAX) 17 g packet, Take 17 g by mouth daily as needed for mild constipation., Disp: 14 each, Rfl: 0 .  rivaroxaban (XARELTO) 20 MG TABS tablet, Take 1 tablet (  20 mg total) by mouth daily with supper., Disp: 30 tablet, Rfl: 6      Objective:   Vitals:   04/24/20 1040  BP: 130/68  Pulse: 76  Temp: 98.1 F (36.7 C)  TempSrc: Oral  SpO2: 96%  Weight: 209 lb 6.4 oz (95 kg)  Height: 5\' 4"  (1.626 m)    Estimated body mass index is 35.94 kg/m as calculated from the following:   Height as of this encounter: 5\' 4"  (1.626 m).   Weight as of this encounter: 209 lb 6.4 oz (95 kg).  @WEIGHTCHANGE @  [employer]   04/24/20 1040  Weight: 209 lb 6.4 oz (95 kg)     Physical Exam  General Appearance:    Alert, cooperative, no distress, appears stated age - yes , Deconditioned looking - yes , OBESE  - yes, Sitting on Wheelchair -  no  Head:    Normocephalic, without obvious abnormality, atraumatic  Eyes:    PERRL, conjunctiva/corneas clear,  Ears:    Normal TM's and external ear canals, both ears  Nose:   Nares normal, septum midline, mucosa normal, no drainage    or sinus tenderness. OXYGEN ON  - no . Patient is @ ra   Throat:   Lips, mucosa, and tongue normal; teeth and gums normal. Cyanosis on lips - no  Neck:   Supple, symmetrical, trachea midline, no adenopathy;    thyroid:  no enlargement/tenderness/nodules; no carotid   bruit or JVD  Back:     Symmetric, no curvature, ROM normal, no CVA tenderness  Lungs:     Distress - no , Wheeze no, Barrell Chest - no, Purse lip breathing - no, Crackles - no   Chest Wall:    No tenderness or deformity.    Heart:    Regular rate and rhythm, S1 and S2 normal, no rub   or gallop, Murmur - no  Breast Exam:    NOT DONE  Abdomen:     Soft, non-tender, bowel sounds active all four quadrants,    no masses, no organomegaly. Visceral obesity - yes  Genitalia:   NOT DONE  Rectal:   NOT DONE  Extremities:   Extremities - normal, Has Cane - no,  Clubbing - no, Edema - no  Pulses:   2+ and symmetric all extremities  Skin:   Stigmata of Connective Tissue Disease - no  Lymph nodes:   Cervical, supraclavicular, and axillary nodes normal  Psychiatric:  Neurologic:   Pleasant - yes, Anxious - no, Flat affect - no  CAm-ICU - neg, Alert and Oriented x 3 - yes, Moves all 4s - yes, Speech - normal, Cognition - intact           Assessment:       ICD-10-CM   1. Bilateral pulmonary embolism (HCC)  I26.99   2. Pulmonary nodule  R91.1   3. ILD (interstitial lung disease) ([place])  J84.9   4. Dyspnea on exertion  R06.00        Plan:     Patient Instructions     ICD-10-CM   1. Bilateral pulmonary embolism ([facility])  I26.99   2. Pulmonary nodule  R91.1   3. ILD (interstitial lung disease) ([facility])  J84.9   4. Dyspnea on exertion  R06.00      You seem to be slowly improving from your recent blood clot There is a 4 mm lung nodule in June 2021  CT chet It is not clear whether he have interstitial  lung disease or not  Plan -Continue Eliquis -monitor for bleeding   -Duration of treatment can be greater than 1 year -Refer to pulmonary rehabilitation if you are interested -At some point in the next 1 year we will repeat a high-resolution CT chest  Follow-up -4-8 weeks telephone visit with nurse practitioner to report progress with residual symptoms     SIGNATURE    Dr. Brand Males, M.D., F.C.C.P,  Pulmonary and Critical Care Medicine Staff Physician, [place] Director - Interstitial Lung Disease  Program  [facility] [place], [place], [address]  Pager: [phone], If no answer or between  15:00h - 7:00h: call [phone] Telephone: [phone]  11:15 AM 04/24/2020

## 2020-04-25 ENCOUNTER — Encounter (HOSPITAL_COMMUNITY): Payer: Self-pay | Admitting: *Deleted

## 2020-04-25 NOTE — Progress Notes (Signed)
Received referral from [facility] for this pt to participate in pulmonary rehab with the the diagnosis of dyspnea on exertion.  Pt is complete work up for possible ILD. Clinical review of pt follow up appt on 04/24/20 Pulmonary office note.  Reviewed pt primary care and cardiologist office notes.  Pt with Covid Risk Score -6. Pt appropriate for scheduling for Pulmonary rehab.  Will forward to support staff for verification of insurance eligibility/benefits and to pulmonary rehab staff for scheduling with pt consent. Cherre Huger, BSN Cardiac and Training and development officer

## 2020-05-01 ENCOUNTER — Telehealth (HOSPITAL_COMMUNITY): Payer: Self-pay

## 2020-05-01 NOTE — Telephone Encounter (Signed)
Pt insurance is active and benefits verified through [facility] Medicare Co-pay 0, DED $290/$290 met, out of pocket $3,290/$2,556.93 met, co-insurance 20%. no pre-authorization required. Passport, Kayla/[employer] 05/01/2020_0 Glo Herring, REF# [id number]

## 2020-05-10 ENCOUNTER — Ambulatory Visit (INDEPENDENT_AMBULATORY_CARE_PROVIDER_SITE_OTHER): Payer: Medicare Other

## 2020-05-10 ENCOUNTER — Other Ambulatory Visit: Payer: Self-pay

## 2020-05-10 ENCOUNTER — Telehealth: Payer: Self-pay

## 2020-05-10 DIAGNOSIS — Z0000 Encounter for general adult medical examination without abnormal findings: Secondary | ICD-10-CM

## 2020-05-10 NOTE — Telephone Encounter (Signed)
Patient wants someone to call and schedule her labs and physical for this year. AWV part 1 completed.

## 2020-05-10 NOTE — Patient Instructions (Signed)
[name] , Thank you for taking time to come for your Medicare Wellness Visit. I appreciate your ongoing commitment to your health goals. Please review the following plan we discussed and let me know if I can assist you in the future.   Screening recommendations/referrals: Colonoscopy: no longer required Mammogram: Up to date, completed 07/05/2019, due 06/2020 Bone Density: Up to date, completed 10/19/2016, due 2-5 years  Recommended yearly ophthalmology/optometry visit for glaucoma screening and checkup Recommended yearly dental visit for hygiene and checkup  Vaccinations: Influenza vaccine: due, will be available in the office at the end of August Pneumococcal vaccine: Completed series Tdap vaccine: decline- insurance/financial Shingles vaccine: due, check with your insurance regarding coverage   Covid-19:Completed series  Advanced directives: Please bring a copy of your POA (Power of [place]) and/or Living Will to your next appointment.   Conditions/risks identified: hypertension, hyperlipidemia  Next appointment: Follow up in one year for your annual wellness visit    Preventive Care 81 Years and Older, Female Preventive care refers to lifestyle choices and visits with your health care provider that can promote health and wellness. What does preventive care include?  A yearly physical exam. This is also called an annual well check.  Dental exams once or twice a year.  Routine eye exams. Ask your health care provider how often you should have your eyes checked.  Personal lifestyle choices, including:  Daily care of your teeth and gums.  Regular physical activity.  Eating a healthy diet.  Avoiding tobacco and drug use.  Limiting alcohol use.  Practicing safe sex.  Taking low-dose aspirin every day.  Taking vitamin and mineral supplements as recommended by your health care provider. What happens during an annual well check? The services and screenings done by  your health care provider during your annual well check will depend on your age, overall health, lifestyle risk factors, and family history of disease. Counseling  Your health care provider may ask you questions about your:  Alcohol use.  Tobacco use.  Drug use.  Emotional well-being.  Home and relationship well-being.  Sexual activity.  Eating habits.  History of falls.  Memory and ability to understand (cognition).  Work and work Statistician.  Reproductive health. Screening  You may have the following tests or measurements:  Height, weight, and BMI.  Blood pressure.  Lipid and cholesterol levels. These may be checked every 5 years, or more frequently if you are over 69 years old.  Skin check.  Lung cancer screening. You may have this screening every year starting at age 81 if you have a 30-pack-year history of smoking and currently smoke or have quit within the past 15 years.  Fecal occult blood test (FOBT) of the stool. You may have this test every year starting at age 81.  Flexible sigmoidoscopy or colonoscopy. You may have a sigmoidoscopy every 5 years or a colonoscopy every 10 years starting at age 60.  Hepatitis C blood test.  Hepatitis B blood test.  Sexually transmitted disease (STD) testing.  Diabetes screening. This is done by checking your blood sugar (glucose) after you have not eaten for a while (fasting). You may have this done every 1-3 years.  Bone density scan. This is done to screen for osteoporosis. You may have this done starting at age 81  Mammogram. This may be done every 1-2 years. Talk to your health care provider about how often you should have regular mammograms. Talk with your health care provider about your test results,  treatment options, and if necessary, the need for more tests. Vaccines  Your health care provider may recommend certain vaccines, such as:  Influenza vaccine. This is recommended every year.  Tetanus,  diphtheria, and acellular pertussis (Tdap, Td) vaccine. You may need a Td booster every 10 years.  Zoster vaccine. You may need this after age 45.  Pneumococcal 13-valent conjugate (PCV13) vaccine. One dose is recommended after age 81  Pneumococcal polysaccharide (PPSV23) vaccine. One dose is recommended after age 81 Talk to your health care provider about which screenings and vaccines you need and how often you need them. This information is not intended to replace advice given to you by your health care provider. Make sure you discuss any questions you have with your health care provider. Document Released: 10/18/2015 Document Revised: 06/10/2016 Document Reviewed: 07/23/2015 Elsevier Interactive Patient Education  2017 [employer] Prevention in the Home Falls can cause injuries. They can happen to people of all ages. There are many things you can do to make your home safe and to help prevent falls. What can I do on the outside of my home?  Regularly fix the edges of walkways and driveways and fix any cracks.  Remove anything that might make you trip as you walk through a door, such as a raised step or threshold.  Trim any bushes or trees on the path to your home.  Use bright outdoor lighting.  Clear any walking paths of anything that might make someone trip, such as rocks or tools.  Regularly check to see if handrails are loose or broken. Make sure that both sides of any steps have handrails.  Any raised decks and porches should have guardrails on the edges.  Have any leaves, snow, or ice cleared regularly.  Use sand or salt on walking paths during winter.  Clean up any spills in your garage right away. This includes oil or grease spills. What can I do in the bathroom?  Use night lights.  Install grab bars by the toilet and in the tub and shower. Do not use towel bars as grab bars.  Use non-skid mats or decals in the tub or shower.  If you need to sit down in  the shower, use a plastic, non-slip stool.  Keep the floor dry. Clean up any water that spills on the floor as soon as it happens.  Remove soap buildup in the tub or shower regularly.  Attach bath mats securely with double-sided non-slip rug tape.  Do not have throw rugs and other things on the floor that can make you trip. What can I do in the bedroom?  Use night lights.  Make sure that you have a light by your bed that is easy to reach.  Do not use any sheets or blankets that are too big for your bed. They should not hang down onto the floor.  Have a firm chair that has side arms. You can use this for support while you get dressed.  Do not have throw rugs and other things on the floor that can make you trip. What can I do in the kitchen?  Clean up any spills right away.  Avoid walking on wet floors.  Keep items that you use a lot in easy-to-reach places.  If you need to reach something above you, use a strong step stool that has a grab bar.  Keep electrical cords out of the way.  Do not use floor polish or wax that makes floors  slippery. If you must use wax, use non-skid floor wax.  Do not have throw rugs and other things on the floor that can make you trip. What can I do with my stairs?  Do not leave any items on the stairs.  Make sure that there are handrails on both sides of the stairs and use them. Fix handrails that are broken or loose. Make sure that handrails are as long as the stairways.  Check any carpeting to make sure that it is firmly attached to the stairs. Fix any carpet that is loose or worn.  Avoid having throw rugs at the top or bottom of the stairs. If you do have throw rugs, attach them to the floor with carpet tape.  Make sure that you have a light switch at the top of the stairs and the bottom of the stairs. If you do not have them, ask someone to add them for you. What else can I do to help prevent falls?  Wear shoes that:  Do not have high  heels.  Have rubber bottoms.  Are comfortable and fit you well.  Are closed at the toe. Do not wear sandals.  If you use a stepladder:  Make sure that it is fully opened. Do not climb a closed stepladder.  Make sure that both sides of the stepladder are locked into place.  Ask someone to hold it for you, if possible.  Clearly mark and make sure that you can see:  Any grab bars or handrails.  First and last steps.  Where the edge of each step is.  Use tools that help you move around (mobility aids) if they are needed. These include:  Canes.  Walkers.  Scooters.  Crutches.  Turn on the lights when you go into a dark area. Replace any light bulbs as soon as they burn out.  Set up your furniture so you have a clear path. Avoid moving your furniture around.  If any of your floors are uneven, fix them.  If there are any pets around you, be aware of where they are.  Review your medicines with your doctor. Some medicines can make you feel dizzy. This can increase your chance of falling. Ask your doctor what other things that you can do to help prevent falls. This information is not intended to replace advice given to you by your health care provider. Make sure you discuss any questions you have with your health care provider. Document Released: 07/18/2009 Document Revised: 02/27/2016 Document Reviewed: 10/26/2014 Elsevier Interactive Patient Education  2017 [employer].

## 2020-05-10 NOTE — Progress Notes (Signed)
Subjective:   [name] is a 79 y.o. female who presents for Medicare Annual (Subsequent) preventive examination.  Review of Systems: N/A      I connected with the patient today by telephone and verified that I am speaking with the correct person using two identifiers. Location patient: home Location nurse: work Persons participating in the virtual visit: patient, Marine scientist.   I discussed the limitations, risks, security and privacy concerns of performing an evaluation and management service by telephone and the availability of in person appointments. I also discussed with the patient that there may be a patient responsible charge related to this service. The patient expressed understanding and verbally consented to this telephonic visit.    Interactive audio and video telecommunications were attempted between this nurse and patient, however failed, due to patient having technical difficulties OR patient did not have access to video capability.  We continued and completed visit with audio only.     Cardiac Risk Factors include: advanced age (>41men, >61 women);hypertension;dyslipidemia     Objective:    Today's Vitals   There is no height or weight on file to calculate BMI.  Advanced Directives 05/10/2020 04/01/2020 04/01/2020 10/11/2019 09/20/2019 09/15/2019 11/04/2017  Does Patient Have a Medical Advance Directive? Yes Yes Yes Yes Yes Yes Yes  Type of Paramedic of [place];Living will [place];Living will [place];Living will [place];Living will [place];Living will [place];Living will [place];Living will  Does patient want to make changes to medical advance directive? - No - Patient declined - - No - Patient declined - -  Copy of [place] in Chart? No - copy requested - - No - copy requested No - copy requested - No  - copy requested  Pre-existing out of facility DNR order (yellow form or pink MOST form) - - - - - - -    Current Medications (verified) Outpatient Encounter Medications as of 05/10/2020  Medication Sig  . acetaminophen (TYLENOL) 500 MG tablet Take 1,000 mg by mouth 3 (three) times daily.  . Adalimumab (HUMIRA PEN [place]) Inject 40 mg into the skin every 14 (fourteen) days.   Marland Kitchen docusate sodium (COLACE) 100 MG capsule Take 1 capsule (100 mg total) by mouth 2 (two) times daily.  . folic acid (FOLVITE) 1 MG tablet Take 1 mg by mouth daily.   . hydrochlorothiazide (HYDRODIURIL) 25 MG tablet Take 12.5 mg by mouth every other day.  . mometasone (ELOCON) 0.1 % ointment Apply topically 2 (two) times daily.  . polyethylene glycol (MIRALAX / GLYCOLAX) 17 g packet Take 17 g by mouth daily as needed for mild constipation.  . rivaroxaban (XARELTO) 20 MG TABS tablet Take 1 tablet (20 mg total) by mouth daily with supper.   No facility-administered encounter medications on file as of 05/10/2020.    Allergies (verified) Codeine and Hydrocodone-homatropine   History: Past Medical History:  Diagnosis Date  . Cancer [facility])    breast  . Cataract    diagnosed by Dr. Phineas Douglas  . Dysrhythmia    PSVT/ palpitations-  controlled with prn Inderal  . GERD (gastroesophageal reflux disease)    30 years ago.   Marland Kitchen Hyperlipidemia    statin intolerant  . Hypertension    eccho 4/13 EPIC  . Migraines   . Obesity   . Osteoarthritis   . Phlebitis of leg, right, superficial 1986/1989   x 2  Past Surgical History:  Procedure Laterality Date  . BREAST LUMPECTOMY WITH RADIOACTIVE SEED LOCALIZATION Bilateral 09/20/2019   Procedure: BILATERAL BREAST LUMPECTOMIES WITH BILATERAL RADIOACTIVE SEED LOCALIZATION AND RIGHT NIPPLE BIOPSY;  Surgeon: Erroll Luna, MD;  Location: [facility];  Service: General;  Laterality: Bilateral;  . CARDIAC CATHETERIZATION  1990s   negative  . 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 CARDIOVASCULAR STRESS TEST  02/02/2007   EF 70%, NO ISCHEMIA  . DILATION AND CURETTAGE, DIAGNOSTIC / THERAPEUTIC  2008  . LESION DESTRUCTION  10/2017   Face; Dr. Ronnald Ramp  . ROTATOR CUFF REPAIR  1999   Right, left in 2002  . 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 US ECHOCARDIOGRAPHY  02/07/2007   EF 55-60%   Family History  Problem Relation Age of Onset  . Heart attack Father 59       PUD  . Hypertension Father   . Lung cancer Brother   . Heart defect Sister   . Hypertension Mother   . Coronary artery disease Other        Female 1st degree relative <50   Social History   Socioeconomic History  . Marital status: Married    Spouse name: Not on file  . Number of children: Not on file  . Years of education: Not on file  . Highest education level: Not on file  Occupational History  . Occupation: Retired  Tobacco Use  . Smoking status: Former Smoker    Packs/day: 1.00    Years: 5.00    Pack years: 5.00    Quit date: 03/15/1978    Years since quitting: 42.1  . Smokeless tobacco: Never Used  . Tobacco comment: quit smoking 40 years ago  Vaping Use  . Vaping Use: Never used  Substance and Sexual Activity  . Alcohol use: No    Comment: stopped 5 years ago  . Drug use: No  . Sexual activity: Yes  Other Topics Concern  . Not on file  Social History Narrative   Regular exercise: yes walks 1 mile a day   Diet: loves butter, fruit and veggies   Social Determinants of Health   Financial Resource Strain: Low Risk   . Difficulty of Paying Living Expenses: Not hard at all  Food Insecurity: No Food Insecurity  . Worried About Charity fundraiser in the Last Year: Never true  . Ran Out of Food in the Last Year: Never true  Transportation Needs: No Transportation Needs  . Lack of Transportation (Medical):  No  . Lack of Transportation (Non-Medical): No  Physical Activity: Sufficiently Active  . Days of Exercise per Week: 7 days  . Minutes of Exercise per Session: 30 min  Stress:   . Feeling of Stress :   Social Connections:   . Frequency of Communication with Friends and Family:   . Frequency of Social Gatherings with Friends and Family:   . Attends Religious Services:   . Active Member of Clubs or Organizations:   . Attends Archivist Meetings:   Marland Kitchen Marital Status:     Tobacco Counseling Counseling given: Not Answered Comment: quit smoking 40 years ago   Clinical Intake:  Pre-visit preparation completed: Yes  Pain : No/denies pain     Nutritional Risks:  None Diabetes: No  How often do you need to have someone help you when you read instructions, pamphlets, or other written materials from your doctor or pharmacy?: 1 - Never What is the last grade level you completed in school?: 12th  Diabetic: No Nutrition Risk Assessment:  Has the patient had any N/V/D within the last 2 months?  No  Does the patient have any non-healing wounds?  No  Has the patient had any unintentional weight loss or weight gain?  No   Diabetes:  Is the patient diabetic?  No  If diabetic, was a CBG obtained today?  N/A Did the patient bring in their glucometer from home?  N/A How often do you monitor your CBG's? N/A.   Financial Strains and Diabetes Management:  Are you having any financial strains with the device, your supplies or your medication? N/A.  Does the patient want to be seen by Chronic Care Management for management of their diabetes?  N/A Would the patient like to be referred to a Nutritionist or for Diabetic Management?  N/A     Interpreter Needed?: No  Information entered by :: CJohnson, LPN   Activities of Daily Living In your present state of health, do you have any difficulty performing the following activities: 05/10/2020 04/01/2020  Hearing? N N  Vision? N N   Difficulty concentrating or making decisions? N N  Walking or climbing stairs? N N  Dressing or bathing? N N  Doing errands, shopping? N N  Preparing Food and eating ? N -  Using the Toilet? N -  In the past six months, have you accidently leaked urine? Y -  Comment wears pads -  Do you have problems with loss of bowel control? N -  Managing your Medications? N -  Managing your Finances? N -  Housekeeping or managing your Housekeeping? N -  Some recent data might be hidden    Patient Care Team: [name], MD as PCP - General Josue Hector, MD as PCP - Cardiology (Cardiology) Marvene Staff as Physician Assistant (Rheumatology) Christain [place], [place] as Referring Physician (Optometry) Danella Sensing, MD as Consulting Physician (Dermatology) Mauro Kaufmann, RN as Oncology Nurse Navigator Rockwell [place], RN as Oncology Nurse Navigator  Indicate any recent Medical Services you may have received from other than [facility] providers in the past year (date may be approximate).     Assessment:   This is a routine wellness examination for [name].  Hearing/Vision screen  Hearing Screening   125Hz  250Hz  500Hz  1000Hz  2000Hz  3000Hz  4000Hz  6000Hz  8000Hz   Right ear:           Left ear:           Vision Screening Comments: Patient gets annual eye exams  Dietary issues and exercise activities discussed: Current Exercise Habits: Home exercise routine, Type of exercise: walking, Time (Minutes): 30, Frequency (Times/Week): 7, Weekly Exercise (Minutes/Week): 210, Intensity: Moderate, Exercise limited by: None identified  Goals    . Follow up with Primary Care Provider     Starting 11/04/2017, I will continue to take medications as prescribed and to keep appointments with PCP as scheduled.     . pain management     Starting 10/06/2016, I will continue to take RA medication as prescribed in an effort to keep pain levels below 3/10.     Marland Kitchen Patient Stated     05/10/2020, I will continue to  walk everyday for about 30 minutes.  Depression Screen PHQ 2/9 Scores 05/10/2020 02/06/2020 11/24/2018 11/04/2017 10/06/2016 09/11/2015  PHQ - 2 Score 0 0 0 0 0 0  PHQ- 9 Score 0 - - 0 - -    Fall Risk Fall Risk  05/10/2020 02/06/2020 11/24/2018 11/04/2017 10/06/2016  Falls in the past year? 0 0 0 Yes No  Comment - - - 4 falls with minor injury and no medical treatment -  Number falls in past yr: 0 - - 2 or more -  Injury with Fall? 0 - - - -  Risk for fall due to : Medication side effect - - - -  Follow up Falls evaluation completed;Falls prevention discussed - - - -    Any stairs in or around the home? Yes  If so, are there any without handrails? No  Home free of loose throw rugs in walkways, pet beds, electrical cords, etc? Yes  Adequate lighting in your home to reduce risk of falls? Yes   ASSISTIVE DEVICES UTILIZED TO PREVENT FALLS:  Life alert? No  Use of a cane, walker or w/c? No  Grab bars in the bathroom? Yes  Shower chair or bench in shower? No  Elevated toilet seat or a handicapped toilet? No   TIMED UP AND GO:  Was the test performed? N/A, telephonic visit .    Cognitive Function: MMSE - Mini Mental State Exam 05/10/2020 11/04/2017 10/06/2016  Orientation to time 5 5 5   Orientation to Place 5 5 5   Registration 3 3 3   Attention/ Calculation 5 0 0  Recall 3 3 3   Language- name 2 objects - 0 0  Language- repeat 1 1 1   Language- follow 3 step command - 3 3  Language- read & follow direction - 0 0  Write a sentence - 0 0  Copy design - 0 0  Total score - 20 20  Mini Cog  Mini-Cog screen was completed. Maximum score is 22. A value of 0 denotes this part of the MMSE was not completed or the patient failed this part of the Mini-Cog screening.       Immunizations Immunization History  Administered Date(s) Administered  . Fluad Quad(high Dose 65+) 06/27/2019  . Influenza,inj,Quad PF,6+ Mos 08/05/2017, 08/04/2018  . PFIZER SARS-COV-2 Vaccination 10/20/2019, 11/10/2019   . Pneumococcal Conjugate-13 07/10/2014  . Pneumococcal Polysaccharide-23 01/04/2008  . Td 10/05/1996, 01/04/2008  . Zoster 02/01/2008    TDAP status: Due, Education has been provided regarding the importance of this vaccine. Advised may receive this vaccine at local pharmacy or Health Dept. Aware to provide a copy of the vaccination record if obtained from local pharmacy or Health Dept. Verbalized acceptance and understanding. Flu Vaccine status: due, will be available in the office at the end of August Pneumococcal vaccine status: Up to date Covid-19 vaccine status: Completed vaccines  Qualifies for Shingles Vaccine? Yes   Zostavax completed Yes   Shingrix Completed?: No.    Education has been provided regarding the importance of this vaccine. Patient has been advised to call insurance company to determine out of pocket expense if they have not yet received this vaccine. Advised may also receive vaccine at local pharmacy or Health Dept. Verbalized acceptance and understanding.  Screening Tests Health Maintenance  Topic Date Due  . INFLUENZA VACCINE  05/05/2020  . TETANUS/TDAP  05/10/2024 (Originally 01/03/2018)  . DEXA SCAN  Completed  . COVID-19 Vaccine  Completed  . PNA vac Low Risk Adult  Completed    Health Maintenance  Health  Maintenance Due  Topic Date Due  . INFLUENZA VACCINE  05/05/2020    Colorectal cancer screening: No longer required.  Mammogram status: Completed 07/05/2019. Repeat every year Bone Density status: Completed 10/19/2016. Results reflect: Bone density results: NORMAL. Repeat every 2-5 years.  Lung Cancer Screening: (Low Dose CT Chest recommended if Age 2-80 years, 30 pack-year currently smoking OR have quit w/in 15years.) does not qualify.    Additional Screening:  Hepatitis C Screening: does not qualify; Completed N/A  Vision Screening: Recommended annual ophthalmology exams for early detection of glaucoma and other disorders of the eye. Is the  patient up to date with their annual eye exam?  Yes  Who is the provider or what is the name of the office in which the patient attends annual eye exams? Dr. Jodene Nam If pt is not established with a provider, would they like to be referred to a provider to establish care? No .   Dental Screening: Recommended annual dental exams for proper oral hygiene  Community Resource Referral / Chronic Care Management: CRR required this visit?  No   CCM required this visit?  No      Plan:     I have personally reviewed and noted the following in the patient's chart:   . Medical and social history . Use of alcohol, tobacco or illicit drugs  . Current medications and supplements . Functional ability and status . Nutritional status . Physical activity . Advanced directives . List of other physicians . Hospitalizations, surgeries, and ER visits in previous 12 months . Vitals . Screenings to include cognitive, depression, and falls . Referrals and appointments  In addition, I have reviewed and discussed with patient certain preventive protocols, quality metrics, and best practice recommendations. A written personalized care plan for preventive services as well as general preventive health recommendations were provided to patient.   Due to this being a telephonic visit, the after visit summary with patients personalized plan was offered to patient via mail or my-chart. Patient preferred to pick up at office at next visit.   Andrez Grime, LPN   [id number]

## 2020-05-10 NOTE — Telephone Encounter (Signed)
Labs 8/13 cpx 8/17 Pt aware

## 2020-05-10 NOTE — Progress Notes (Signed)
PCP notes:  Health Maintenance: Tdap- insurance/financial   Abnormal Screenings: none   Patient concerns: none   Nurse concerns: none   Next PCP appt.: none 

## 2020-05-17 ENCOUNTER — Other Ambulatory Visit: Payer: Self-pay

## 2020-05-17 ENCOUNTER — Other Ambulatory Visit (INDEPENDENT_AMBULATORY_CARE_PROVIDER_SITE_OTHER): Payer: Medicare Other

## 2020-05-17 ENCOUNTER — Telehealth: Payer: Self-pay | Admitting: Family Medicine

## 2020-05-17 ENCOUNTER — Other Ambulatory Visit: Payer: Medicare Other

## 2020-05-17 DIAGNOSIS — R7303 Prediabetes: Secondary | ICD-10-CM | POA: Diagnosis not present

## 2020-05-17 DIAGNOSIS — E785 Hyperlipidemia, unspecified: Secondary | ICD-10-CM

## 2020-05-17 LAB — COMPREHENSIVE METABOLIC PANEL
ALT: 21 U/L (ref 0–35)
AST: 24 U/L (ref 0–37)
Albumin: 4.1 g/dL (ref 3.5–5.2)
Alkaline Phosphatase: 82 U/L (ref 39–117)
BUN: 14 mg/dL (ref 6–23)
CO2: 29 mEq/L (ref 19–32)
Calcium: 9.3 mg/dL (ref 8.4–10.5)
Chloride: 104 mEq/L (ref 96–112)
Creatinine, Ser: 0.77 mg/dL (ref 0.40–1.20)
GFR: 71.92 mL/min (ref >60.00)
Glucose, Bld: 114 mg/dL — ABNORMAL HIGH (ref 70–99)
Potassium: 4 mEq/L (ref 3.5–5.1)
Sodium: 141 mEq/L (ref 135–145)
Total Bilirubin: 0.5 mg/dL (ref 0.2–1.2)
Total Protein: 7.3 g/dL (ref 6.0–8.3)

## 2020-05-17 LAB — LIPID PANEL
Cholesterol: 222 mg/dL — ABNORMAL HIGH (ref 0–200)
HDL: 50.1 mg/dL (ref >39.00)
LDL Cholesterol: 147 mg/dL — ABNORMAL HIGH (ref 0–99)
NonHDL: 171.43
Total CHOL/HDL Ratio: 4
Triglycerides: 120 mg/dL (ref 0.0–149.0)
VLDL: 24 mg/dL (ref 0.0–40.0)

## 2020-05-17 LAB — HEMOGLOBIN A1C: Hgb A1c MFr Bld: 5.9 % (ref 4.6–6.5)

## 2020-05-17 NOTE — Telephone Encounter (Signed)
-----   Message from Ellamae Sia sent at 05/10/2020  3:39 PM EDT ----- Regarding: Lab orders for Friday, 8.13.21 Patient is scheduled for CPX labs, please order future labs, Thanks , Karna Christmas

## 2020-05-17 NOTE — Progress Notes (Signed)
No critical labs need to be addressed urgently. We will discuss labs in detail at upcoming office visit.   

## 2020-05-21 ENCOUNTER — Encounter: Payer: Self-pay | Admitting: Family Medicine

## 2020-05-21 ENCOUNTER — Telehealth: Payer: Self-pay | Admitting: *Deleted

## 2020-05-21 ENCOUNTER — Ambulatory Visit (INDEPENDENT_AMBULATORY_CARE_PROVIDER_SITE_OTHER): Payer: Medicare Other | Admitting: Family Medicine

## 2020-05-21 ENCOUNTER — Other Ambulatory Visit: Payer: Self-pay

## 2020-05-21 VITALS — BP 120/76 | HR 71 | Temp 96.4°F | Ht 64.0 in | Wt 213.5 lb

## 2020-05-21 DIAGNOSIS — D0511 Intraductal carcinoma in situ of right breast: Secondary | ICD-10-CM | POA: Diagnosis not present

## 2020-05-21 DIAGNOSIS — I10 Essential (primary) hypertension: Secondary | ICD-10-CM | POA: Diagnosis not present

## 2020-05-21 DIAGNOSIS — M069 Rheumatoid arthritis, unspecified: Secondary | ICD-10-CM

## 2020-05-21 DIAGNOSIS — I2699 Other pulmonary embolism without acute cor pulmonale: Secondary | ICD-10-CM | POA: Diagnosis not present

## 2020-05-21 DIAGNOSIS — Z0000 Encounter for general adult medical examination without abnormal findings: Secondary | ICD-10-CM

## 2020-05-21 DIAGNOSIS — N950 Postmenopausal bleeding: Secondary | ICD-10-CM

## 2020-05-21 DIAGNOSIS — J849 Interstitial pulmonary disease, unspecified: Secondary | ICD-10-CM

## 2020-05-21 DIAGNOSIS — E559 Vitamin D deficiency, unspecified: Secondary | ICD-10-CM

## 2020-05-21 DIAGNOSIS — I471 Supraventricular tachycardia: Secondary | ICD-10-CM

## 2020-05-21 DIAGNOSIS — E785 Hyperlipidemia, unspecified: Secondary | ICD-10-CM

## 2020-05-21 DIAGNOSIS — Z6836 Body mass index (BMI) 36.0-36.9, adult: Secondary | ICD-10-CM

## 2020-05-21 DIAGNOSIS — R7303 Prediabetes: Secondary | ICD-10-CM

## 2020-05-21 LAB — VITAMIN D 25 HYDROXY (VIT D DEFICIENCY, FRACTURES): VITD: 18.77 ng/mL — ABNORMAL LOW (ref 30.00–100.00)

## 2020-05-21 NOTE — Patient Instructions (Addendum)
Please stop at the lab to have labs drawn. We will call to set up ultrasound.

## 2020-05-21 NOTE — Telephone Encounter (Signed)
Pt calling wanting to know the name of the place she is to have US done and a contact #. It is [facility] imaging phone [phone]. Pt voiced understanding and appreciative. Nothing further needed.

## 2020-05-21 NOTE — Telephone Encounter (Signed)
-----   Message from [name], MD sent at 05/21/2020  1:43 PM EDT -----  Call She does not like MychartThe third dose COVID vaccine is available for her through [facility].. she can call [phone] to set it up. Also it is okay to take the flu shot along with this. 3 dose.. let her know we have word we will be giving flu shots in the next week. As far as Shingrix... she can receive this but would need to hold Humira for a time after each dose.. if she is interested in doing this let me know and I will get specific instructions for her.

## 2020-05-21 NOTE — Progress Notes (Signed)
Chief Complaint  Patient presents with  . Medicare Wellness    Part 2    History of Present Illness: HPI  The patient presents for complete physical and review of chronic health problems. He/She also has the following acute concerns today: none   The patient saw a LPN or RN for medicare wellness visit.  Prevention and wellness was reviewed in detail. Note reviewed and important notes copied below.   05/21/20    Dx 6/28/21Bilateral pulmonary embolism: On  Xarelto Interstitial Lung disease: Possible mild early changes.. seen on chest CT.  Looking into pulmonary rehab.  4 mm nodule in lung on CT 03/2020 Pulmonary eval reviewed OV from 04/2020 Dr. Chase Caller  Plan  Repeat CT in next year.  After starting Xarelto in 03/2020.Marland Kitchen after 3 weeks she had dark, brown vaginal discharge.. lasted one episode on toilet tissue.    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 Refused antiestrogen therapy  Hypertension: Good control on current regimen. BP Readings from Last 3 Encounters:  05/21/20 120/76  04/24/20 130/68  04/15/20 (!) 179/75  Using medication without problems or lightheadedness: none Chest pain with exertion:none Edema:none.[place] Flock 1/2 tab HCTZ  Every other day Short of breath: yes Average home BPs: Other issues:  Elevated Cholesterol:  LDL not at goal. Pt intolerant of statins. Never tried the zetia. Lab Results  Component Value Date   CHOL 222 (H) 05/17/2020   HDL 50.10 05/17/2020   LDLCALC 147 (H) 05/17/2020   LDLDIRECT 151.5 08/17/2012   TRIG 120.0 05/17/2020   CHOLHDL 4 05/17/2020  Using medications without problems: Muscle aches:  Diet compliance: Exercise: Other complaints:    RA: Followed by rheum.   Obesity  Wt Readings from Last 3 Encounters:  05/21/20 213 lb 8 oz (96.8 kg)  04/24/20 209 lb 6.4 oz (95 kg)  04/15/20 208 lb 12.8 oz (94.7 kg)     This visit occurred during the SARS-CoV-2 public health emergency.  Safety protocols were in  place, including screening questions prior to the visit, additional usage of staff PPE, and extensive cleaning of exam room while observing appropriate contact time as indicated for disinfecting solutions.   COVID 19 screen:  No recent travel or known exposure to COVID19 The patient denies respiratory symptoms of COVID 19 at this time. The importance of social distancing was discussed today.     Review of Systems  Constitutional: Positive for malaise/fatigue. Negative for chills and fever.  HENT: Negative for congestion and ear pain.   Eyes: Negative for pain and redness.  Respiratory: Negative for cough and shortness of breath.   Cardiovascular: Negative for chest pain, palpitations and leg swelling.  Gastrointestinal: Negative for abdominal pain, blood in stool, constipation, diarrhea, nausea and vomiting.  Genitourinary: Negative for dysuria.  Musculoskeletal: Positive for joint pain. Negative for falls and myalgias.  Skin: Negative for rash.  Neurological: Negative for dizziness.  Psychiatric/Behavioral: Negative for depression. The patient is not nervous/anxious.       Past Medical History:  Diagnosis Date  . Cancer [facility])    breast  . Cataract    diagnosed by Dr. Phineas Douglas  . Dysrhythmia    PSVT/ palpitations-  controlled with prn Inderal  . GERD (gastroesophageal reflux disease)    30 years ago.   Marland Kitchen Hyperlipidemia    statin intolerant  . Hypertension    eccho 4/13 EPIC  . Migraines   . Obesity   . Osteoarthritis   . Phlebitis of leg, right, superficial 1986/1989  x 2    reports that she quit smoking about 42 years ago. She has a 5.00 pack-year smoking history. She has never used smokeless tobacco. She reports that she does not drink alcohol and does not use drugs.   Current Outpatient Medications:  .  acetaminophen (TYLENOL) 500 MG tablet, Take 1,000 mg by mouth 3 (three) times daily., Disp: , Rfl:  .  Adalimumab (HUMIRA PEN [place]), Inject 40 mg into the skin every 14  (fourteen) days. , Disp: , Rfl:  .  docusate sodium (COLACE) 100 MG capsule, Take 1 capsule (100 mg total) by mouth 2 (two) times daily., Disp: 10 capsule, Rfl: 0 .  folic acid (FOLVITE) 1 MG tablet, Take 1 mg by mouth daily. , Disp: , Rfl:  .  hydrochlorothiazide (HYDRODIURIL) 25 MG tablet, Take 12.5 mg by mouth every other day., Disp: , Rfl:  .  mometasone (ELOCON) 0.1 % ointment, Apply topically 2 (two) times daily., Disp: 45 g, Rfl: 1 .  polyethylene glycol (MIRALAX / GLYCOLAX) 17 g packet, Take 17 g by mouth daily as needed for mild constipation., Disp: 14 each, Rfl: 0 .  rivaroxaban (XARELTO) 20 MG TABS tablet, Take 1 tablet (20 mg total) by mouth daily with supper., Disp: 30 tablet, Rfl: 6   Observations/Objective: Blood pressure 120/76, pulse 71, temperature (!) 96.4 F (35.8 C), temperature source Temporal, height 5\' 4"  (1.626 m), weight 213 lb 8 oz (96.8 kg), SpO2 95 %.  Physical Exam Constitutional:      General: She is not in acute distress.    Appearance: Normal appearance. She is well-developed. She is obese. She is not ill-appearing or toxic-appearing.  HENT:     Head: Normocephalic.     Right Ear: Hearing, tympanic membrane, ear canal and external ear normal.     Left Ear: Hearing, tympanic membrane, ear canal and external ear normal.     Nose: Nose normal.  Eyes:     General: Lids are normal. Lids are everted, no foreign bodies appreciated.     Conjunctiva/sclera: Conjunctivae normal.     Pupils: Pupils are equal, round, and reactive to light.  Neck:     Thyroid: No thyroid mass or thyromegaly.     Vascular: No carotid bruit.     Trachea: Trachea normal.  Cardiovascular:     Rate and Rhythm: Normal rate and regular rhythm.     Heart sounds: Normal heart sounds, S1 normal and S2 normal. No murmur heard.  No gallop.   Pulmonary:     Effort: Pulmonary effort is normal. No respiratory distress.     Breath sounds: Normal breath sounds. No wheezing, rhonchi or rales.   Abdominal:     General: Bowel sounds are normal. There is no distension or abdominal bruit.     Palpations: Abdomen is soft. There is no fluid wave or mass.     Tenderness: There is no abdominal tenderness. There is no guarding or rebound.     Hernia: No hernia is present.  Musculoskeletal:     Cervical back: Normal range of motion and neck supple.  Lymphadenopathy:     Cervical: No cervical adenopathy.  Skin:    General: Skin is warm and dry.     Findings: No rash.  Neurological:     Mental Status: She is alert.     Cranial Nerves: No cranial nerve deficit.     Sensory: No sensory deficit.  Psychiatric:        Mood and Affect:  Mood is not anxious or depressed.        Speech: Speech normal.        Behavior: Behavior normal. Behavior is cooperative.        Judgment: Judgment normal.      Assessment and Plan   The patient's preventative maintenance and recommended screening tests for an annual wellness exam were reviewed in full today. Brought up to date unless services declined.  Counselled on the importance of diet, exercise, and its role in overall health and mortality. The patient's FH and SH was reviewed, including their home life, tobacco status, and drug and alcohol status.   Vaccines:Tdap refused, COVID19 uptodate, Can consider shingrix. Pap/DVE:Not indicated Mammo:06/2019 Bone Density:2019normal, repeat in 5 years Cologuard.: neg 2018,no further needed as will be 75 when next due Smoking Status: former smoker  Severe obesity with body mass index (BMI) of 36.0 to 36.9 with serious comorbidity ([place]) Encouraged exercise, weight loss, healthy eating habits.   Rheumatoid arthritis ([place])  Followed by rheumatology  ILD (interstitial lung disease) ([place]) Possible mild early changes.. seen on chest CT.  Looking into pulmonary rehab.   Essential hypertension, benign Well controlled. Continue current medication.   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 Refused antiestrogen therapy  Bilateral pulmonary embolism ([place]) On Xarelto  Post-menopausal bleeding On xarelto.. send for pelvic US.. may need GYN referral   Eliezer Lofts, MD

## 2020-05-21 NOTE — Progress Notes (Signed)
See Phone Note. 

## 2020-05-21 NOTE — Telephone Encounter (Signed)
Mrs. No notified as instructed by telephone.  She thinks for now she will just hold off on the Shingrix vaccine.  Patient also states she has been scheduled for her ultrasound this Friday 05/24/2020.

## 2020-05-22 ENCOUNTER — Telehealth: Payer: Self-pay | Admitting: *Deleted

## 2020-05-22 MED ORDER — VITAMIN D3 1.25 MG (50000 UT) PO TABS: 1.0000 | ORAL_TABLET | ORAL | 0 refills | Status: DC

## 2020-05-22 NOTE — Telephone Encounter (Signed)
Mrs. Inabinet notified as instructed by telephone.  Vit D3 50,000 unit prescription sent to [place] as instructed by Dr. Diona Browner.

## 2020-05-22 NOTE — Telephone Encounter (Signed)
-----   Message from [name], MD sent at 05/21/2020  5:59 PM EDT ----- Call   Vit D is better but still low.. Call if Vit D3 50,000 units weekly x 12 weeks 0 RF.

## 2020-05-23 ENCOUNTER — Telehealth (HOSPITAL_COMMUNITY): Payer: Self-pay

## 2020-05-24 ENCOUNTER — Ambulatory Visit
Admission: RE | Admit: 2020-05-24 | Discharge: 2020-05-24 | Disposition: A | Discharge status: Home / Self Care | Payer: Medicare Other | Source: Ambulatory Visit | Attending: Family Medicine | Admitting: Family Medicine

## 2020-05-24 DIAGNOSIS — N950 Postmenopausal bleeding: Secondary | ICD-10-CM

## 2020-05-28 ENCOUNTER — Ambulatory Visit: Payer: Self-pay | Attending: Internal Medicine

## 2020-05-28 ENCOUNTER — Ambulatory Visit: Payer: Self-pay

## 2020-05-28 ENCOUNTER — Telehealth: Payer: Self-pay | Admitting: *Deleted

## 2020-05-28 ENCOUNTER — Institutional Professional Consult (permissible substitution): Payer: Medicare Other | Admitting: Pulmonary Disease

## 2020-05-28 DIAGNOSIS — Z23 Encounter for immunization: Secondary | ICD-10-CM

## 2020-05-28 NOTE — Telephone Encounter (Signed)
[name] notified as instructed by telephone. Patient states understanding.  °

## 2020-05-28 NOTE — Telephone Encounter (Signed)
Patient left a voicemail stating she had test done last Friday and wants to know the results.

## 2020-05-28 NOTE — Progress Notes (Signed)
   Covid-19 Vaccination Clinic  Name:  ALISA STJAMES    MRN: [id number] DOB: 03-Dec-1940  05/28/2020  Ms. Gaona was observed post Covid-19 immunization for 15 minutes without incident. She was provided with Vaccine Information Sheet and instruction to access the V-Safe system.   Ms. Christoph was instructed to call 911 with any severe reactions post vaccine: Marland Kitchen Difficulty breathing  . Swelling of face and throat  . A fast heartbeat  . A bad rash all over body  . Dizziness and weakness

## 2020-05-28 NOTE — Telephone Encounter (Signed)
Ultrasound results: Call ( she does not like MyChart)  No clear sign of concerning cause of that episode of spotting. There is  likely unrelated calcification in uterus from fibroids or other past process. If it recurs we will send you to GYN to evaluate further.

## 2020-06-04 NOTE — Progress Notes (Signed)
Virtual Visit via Telephone Note  I connected with [name] on 06/05/20 at  9:00 AM EDT by telephone and verified that I am speaking with the correct person using two identifiers.  Location: Patient: Home Provider: [facility] - [address], [place], [place], [place] [address]   I discussed the limitations, risks, security and privacy concerns of performing an evaluation and management service by telephone and the availability of in person appointments. I also discussed with the patient that there may be a patient responsible charge related to this service. The patient expressed understanding and agreed to proceed.  Patient consented to consult via telephone: Yes People present and their role in pt care: Pt     History of Present Illness:  79 year old female former smoker followed in our office for interstitial lung disease and history of pulmonary nodules and PE  Past medical history: Hypertension, hyperlipidemia, GERD, prediabetes, rheumatoid arthritis Smoking history: Former smoker Maintenance: Patient of Dr. Chase Caller  Chief complaint: 4-8-week virtual follow-up  79 year old female former smoker upon our office interstitial lung disease, history of pulmonary nodules and PE.  Patient was last seen in July/2021 by Dr. Chase Caller.  It was recommended at that office visit that she continue Eliquis.  She was referred to pulmonary rehab.  It was recommended that she obtain a high-resolution CT chest sometime over the next year and she have a follow-up with our office in 4 to 8 weeks.  Patient has an already scheduled January/2022 CTA chest ordered by hematology.  Patient was last seen by hematology in July/2021.  Plan of care from that office visit is listed below:  ASSESSMENT & PLAN:  Ductal carcinoma in situ (DCIS) of right breast 09/10/2019:Bilateral lumpectomies ([place]): Left breast: complex sclerosing lesion with no evidence of malignancy Right  breast: nipple adenoma with intraductal papilloma and DCIS, intermediate grade, 0.4cm, clear margins.ER 100%, PR 70% Tis NX stage 0  Treatment plan:Adjuvant radiation2/23/2021-12/19/2019 Patient decided not to take antiestrogen therapy because of her history of superficial venous thrombosis. Hospitalization 04/01/2020 04/02/2020: Bilateral PEs-(no clear-cut precipitating causes) Current treatment: Xarelto Takes Humira for rheumatoid arthritis  I assured her that she would not need oxygen for very long.  In fact within a month she may not require any oxygen at all.  Return to clinic in 6 months with CT scan and follow-up         Orders Placed This Encounter  Procedures  . CT ANGIO CHEST PE W OR WO CONTRAST    Standing Status:   Future    Standing Expiration Date:   04/15/2021    Order Specific Question:   ** REASON FOR EXAM (FREE TEXT)    Answer:   PE    Order Specific Question:   If indicated for the ordered procedure, I authorize the administration of contrast media per Radiology protocol    Answer:   Yes    Order Specific Question:   Preferred imaging location?    Answer:   [facility]    Order Specific Question:   Radiology Contrast Protocol - do NOT remove file path    Answer:   [email or web address]  . Lupus anticoagulant panel    Standing Status:   Future    Number of Occurrences:   1    Standing Expiration Date:   04/15/2021    Reach patient by telephone.  She reports that overall she has been doing well.  She has not noticed worsening symptoms of dyspnea.  She has been working on increasing her overall physical activity.  She has plans to repeat a CT chest as outlined by hematology in January/2022.  She was contacted by pulmonary rehab but she has decided on delaying this enrollment given the increase in COVID-19 numbers given the delta variant.   Observations/Objective:  04/01/2020-CTA chest-positive for  PE without evidence of right heart strain, 0.4 cm right upper lobe pulmonary nodule is new since the prior CT, no follow-up needed patient is low risk, noncontrast chest CT can be considered in 12 months the patient is high risk  04/01/2020-echocardiogram-LV ejection fraction 55 to 84%, grade 1 diastolic dysfunction, right ventricular systolic function is normal, mildly elevated pulmonary artery systolic pressure  10/10/6061-[id number] function test-FVC 2.06 (81% predicted), ratio 75, FEV1 1.54 (81% predicted), DLCO 18.8 (82% predicted)  Social History   Tobacco Use  Smoking Status Former Smoker  . Packs/day: 1.00  . Years: 5.00  . Pack years: 5.00  . Quit date: 03/15/1978  . Years since quitting: 42.2  Smokeless Tobacco Never Used  Tobacco Comment   quit smoking 40 years ago   Immunization History  Administered Date(s) Administered  . Fluad Quad(high Dose 65+) 06/27/2019  . Influenza,inj,Quad PF,6+ Mos 08/05/2017, 08/04/2018  . PFIZER SARS-COV-2 Vaccination 10/20/2019, 11/10/2019, 05/28/2020  . Pneumococcal Conjugate-13 07/10/2014  . Pneumococcal Polysaccharide-23 01/04/2008  . Td 10/05/1996, 01/04/2008  . Zoster 02/01/2008      Assessment and Plan:  Rheumatoid arthritis ([place]) Continue follow-up with rheumatology Continue Humira  ILD (interstitial lung disease) ([place]) Plan: Continue follow-up with Dr. Chase Caller in 3 months Reconsider stance on pulmonary rehab in 2 to 3 months hopefully when COVID-19 numbers have improved Work on increasing overall physical mobility at home  Bilateral pulmonary embolism [facility]) Plan: Continue follow-up with hematology Continue Eliquis Repeat CT of your chest in January/2022 as outlined by hematology   Follow Up Instructions:  Return in about 3 months (around 09/04/2020), or if symptoms worsen or fail to improve, for Follow up with Dr. Purnell Shoemaker.   I discussed the assessment and treatment plan with the patient. The patient was provided  an opportunity to ask questions and all were answered. The patient agreed with the plan and demonstrated an understanding of the instructions.   The patient was advised to call back or seek an in-person evaluation if the symptoms worsen or if the condition fails to improve as anticipated.  I provided 22 minutes of non-face-to-face time during this encounter.   Lauraine Rinne, NP

## 2020-06-05 ENCOUNTER — Encounter: Payer: Self-pay | Admitting: Pulmonary Disease

## 2020-06-05 ENCOUNTER — Ambulatory Visit (INDEPENDENT_AMBULATORY_CARE_PROVIDER_SITE_OTHER): Payer: Medicare Other | Admitting: Pulmonary Disease

## 2020-06-05 ENCOUNTER — Other Ambulatory Visit: Payer: Self-pay

## 2020-06-05 DIAGNOSIS — I2699 Other pulmonary embolism without acute cor pulmonale: Secondary | ICD-10-CM | POA: Diagnosis not present

## 2020-06-05 DIAGNOSIS — J849 Interstitial pulmonary disease, unspecified: Secondary | ICD-10-CM | POA: Diagnosis not present

## 2020-06-05 DIAGNOSIS — M069 Rheumatoid arthritis, unspecified: Secondary | ICD-10-CM

## 2020-06-05 NOTE — Assessment & Plan Note (Signed)
Plan: Continue follow-up with Dr. Chase Caller in 3 months Reconsider stance on pulmonary rehab in 2 to 3 months hopefully when COVID-19 numbers have improved Work on increasing overall physical mobility at home

## 2020-06-05 NOTE — Patient Instructions (Addendum)
You were seen today by Lauraine Rinne, NP  for:   1. Bilateral pulmonary embolism (HCC)  Continue Eliquis  Continue follow-up with hematology  Hematology has you scheduled to obtain a CTA of your chest in January/2022  2. Rheumatoid arthritis, involving unspecified site, unspecified whether rheumatoid factor present [facility])  Continue follow-up with rheumatology  Continue medications as managed by rheumatology  3. ILD (interstitial lung disease) ([place])  Work on increasing overall physical mobility  I am happy that you received her COVID-19 vaccinations  Would recommend the seasonal flu vaccine in fall/2021 when available  Can reconsider stance on pulmonary rehab in 2 months when hopefully COVID-19 numbers have improved in the area   Follow Up:    Return in about 3 months (around 09/04/2020), or if symptoms worsen or fail to improve, for Follow up with Dr. Purnell Shoemaker.   Please do your part to reduce the spread of COVID-19:      Reduce your risk of any infection  and COVID19 by using the similar precautions used for avoiding the common cold or flu:  Marland Kitchen Wash your hands often with soap and warm water for at least 20 seconds.  If soap and water are not readily available, use an alcohol-based hand sanitizer with at least 60% alcohol.  . If coughing or sneezing, cover your mouth and nose by coughing or sneezing into the elbow areas of your shirt or coat, into a tissue or into your sleeve (not your hands). Langley Gauss A MASK when in public  . Avoid shaking hands with others and consider head nods or verbal greetings only. . Avoid touching your eyes, nose, or mouth with unwashed hands.  . Avoid close contact with people who are sick. . Avoid places or events with large numbers of people in one location, like concerts or sporting events. . If you have some symptoms but not all symptoms, continue to monitor at home and seek medical attention if your symptoms worsen. . If you are having a  medical emergency, call 911.   [place] / e-Visit: [email or web address]         [facility]: [facility]: [phone]                   [facility]: [phone]     It is flu season:   >>> Best ways to protect herself from the flu: Receive the yearly flu vaccine, practice good hand hygiene washing with soap and also using hand sanitizer when available, eat a nutritious meals, get adequate rest, hydrate appropriately   Please contact the office if your symptoms worsen or you have concerns that you are not improving.   Thank you for choosing [facility] for your healthcare, and for allowing [place] to partner with you on your healthcare journey. I am thankful to be able to provide care to you today.   Wyn Quaker FNP-C    Exercises To Do While Sitting  Exercises that you do while sitting (chair exercises) can give you many of the same benefits as full exercise. Benefits include strengthening your heart, burning calories, and keeping muscles and joints healthy. Exercise can also improve your mood and help with depression and anxiety. You may benefit from chair exercises if you are unable to do standing exercises because of:  Diabetic foot pain.  Obesity.  Illness.  Arthritis.  Recovery from surgery or injury.  Breathing  problems.  Balance problems.  Another type of disability. Before starting chair exercises, check with your health care provider or a physical therapist to find out how much exercise you can tolerate and which exercises are safe for you. If your health care provider approves:  Start out slowly and build up over time. Aim to work up to about 10-20 minutes for each exercise session.  Make exercise part of your daily routine.  Drink water when you exercise. Do not wait until you are thirsty. Drink  every 10-15 minutes.  Stop exercising right away if you have pain, nausea, shortness of breath, or dizziness.  If you are exercising in a wheelchair, make sure to lock the wheels.  Ask your health care provider whether you can do tai chi or yoga. Many positions in these mind-body exercises can be modified to do while seated. Warm-up Before starting other exercises: 1. Sit up as straight as you can. Have your knees bent at 90 degrees, which is the shape of the capital letter "L." Keep your feet flat on the floor. 2. Sit at the front edge of your chair, if you can. 3. Pull in (tighten) the muscles in your abdomen and stretch your spine and neck as straight as you can. Hold this position for a few minutes. 4. Breathe in and out evenly. Try to concentrate on your breathing, and relax your mind. Stretching Exercise A: Arm stretch 1. Hold your arms out straight in front of your body. 2. Bend your hands at the wrist with your fingers pointing up, as if signaling someone to stop. Notice the slight tension in your forearms as you hold the position. 3. Keeping your arms out and your hands bent, rotate your hands outward as far as you can and hold this stretch. Aim to have your thumbs pointing up and your pinkie fingers pointing down. Slowly repeat arm stretches for one minute as tolerated. Exercise B: Leg stretch 1. If you can move your legs, try to "draw" letters on the floor with the toes of your foot. Write your name with one foot. 2. Write your name with the toes of your other foot. Slowly repeat the movements for one minute as tolerated. Exercise C: Reach for the sky 1. Reach your hands as far over your head as you can to stretch your spine. 2. Move your hands and arms as if you are climbing a rope. Slowly repeat the movements for one minute as tolerated. Range of motion exercises Exercise A: Shoulder roll 1. Let your arms hang loosely at your sides. 2. Lift just your shoulders up toward  your ears, then let them relax back down. 3. When your shoulders feel loose, rotate your shoulders in backward and forward circles. Do shoulder rolls slowly for one minute as tolerated. Exercise B: March in place 1. As if you are marching, pump your arms and lift your legs up and down. Lift your knees as high as you can. ? If you are unable to lift your knees, just pump your arms and move your ankles and feet up and down. March in place for one minute as tolerated. Exercise C: Seated jumping jacks 1. Let your arms hang down straight. 2. Keeping your arms straight, lift them up over your head. Aim to point your fingers to the ceiling. 3. While you lift your arms, straighten your legs and slide your heels along the floor to your sides, as wide as you can. 4. As you bring your arms back  down to your sides, slide your legs back together. ? If you are unable to use your legs, just move your arms. Slowly repeat seated jumping jacks for one minute as tolerated. Strengthening exercises Exercise A: Shoulder squeeze 1. Hold your arms straight out from your body to your sides, with your elbows bent and your fists pointed at the ceiling. 2. Keeping your arms in the bent position, move them forward so your elbows and forearms meet in front of your face. 3. Open your arms back out as wide as you can with your elbows still bent, until you feel your shoulder blades squeezing together. Hold for 5 seconds. Slowly repeat the movements forward and backward for one minute as tolerated. Contact a health care provider if you:  Had to stop exercising due to any of the following: ? Pain. ? Nausea. ? Shortness of breath. ? Dizziness. ? Fatigue.  Have significant pain or soreness after exercising. Get help right away if you have:  Chest pain.  Difficulty breathing. These symptoms may represent a serious problem that is an emergency. Do not wait to see if the symptoms will go away. Get medical help right  away. Call your local emergency services (911 in the [place] Do not drive yourself to the hospital. This information is not intended to replace advice given to you by your health care provider. Make sure you discuss any questions you have with your health care provider. Document Revised: 01/12/2019 Document Reviewed: 08/04/2017 Elsevier Patient Education  [place] Exercise  The sit-to-stand exercise (also known as the chair stand or chair rise exercise) strengthens your lower body and helps you maintain or improve your mobility and independence. The goal is to do the sit-to-stand exercise without using your hands. This will be easier as you become stronger. You should always talk with your health care provider before starting any exercise program, especially if you have had recent surgery. Do the exercise exactly as told by your health care provider and adjust it as directed. It is normal to feel mild stretching, pulling, tightness, or discomfort as you do this exercise, but you should stop right away if you feel sudden pain or your pain gets worse. Do not begin doing this exercise until told by your health care provider. What the sit-to-stand exercise does The sit-to-stand exercise helps to strengthen the muscles in your thighs and the muscles in the center of your body that give you stability (core muscles). This exercise is especially helpful if:  You have had knee or hip surgery.  You have trouble getting up from a chair, out of a car, or off the toilet. How to do the sit-to-stand exercise 1. Sit toward the front edge of a sturdy chair without armrests. Your knees should be bent and your feet should be flat on the floor and shoulder-width apart. 2. Place your hands lightly on each side of the seat. Keep your back and neck as straight as possible, with your chest slightly forward. 3. Breathe in slowly. Lean forward and slightly shift your weight to the front of your  feet. 4. Breathe out as you slowly stand up. Use your hands as little as possible. 5. Stand and pause for a full breath in and out. 6. Breathe in as you sit down slowly. Tighten your core and abdominal muscles to control your lowering as much as possible. 7. Breathe out slowly. 8. Do this exercise 10-15 times. If needed, do it fewer times until  you build up strength. 9. Rest for 1 minute, then do another set of 10-15 repetitions. To change the difficulty of the sit-to-stand exercise  If the exercise is too difficult, use a chair with sturdy armrests, and push off the armrests to help you come to the standing position. You can also use the armrests to help slowly lower yourself back to sitting. As this gets easier, try to use your arms less. You can also place a firm cushion or pillow on the chair to make the surface higher.  If this exercise is too easy, do not use your arms to help raise or lower yourself. You can also wear a weighted vest, use hand weights, increase your repetitions, or try a lower chair. General tips  You may feel tired when starting an exercise routine. This is normal.  You may have muscle soreness that lasts a few days. This is normal. As you get stronger, you may not feel muscle soreness.  Use smooth, steady movements.  Do not  hold your breath during strength exercises. This can cause unsafe changes in your blood pressure.  Breathe in slowly through your nose, and breathe out slowly through your mouth. Summary  Strengthening your lower body is an important step to help you move safely and independently.  The sit-to-stand exercise helps strengthen the muscles in your thighs and core.  You should always talk with your health care provider before starting any exercise program, especially if you have had recent surgery. This information is not intended to replace advice given to you by your health care provider. Make sure you discuss any questions you have with your  health care provider. Document Revised: 07/20/2018 Document Reviewed: 11/12/2016 Elsevier Patient Education  [place].

## 2020-06-05 NOTE — Assessment & Plan Note (Signed)
Plan: Continue follow-up with hematology Continue Eliquis Repeat CT of your chest in January/2022 as outlined by hematology

## 2020-06-05 NOTE — Assessment & Plan Note (Signed)
Continue follow-up with rheumatology Continue Humira

## 2020-07-08 DIAGNOSIS — N950 Postmenopausal bleeding: Secondary | ICD-10-CM | POA: Insufficient documentation

## 2020-07-08 NOTE — Assessment & Plan Note (Signed)
S/P  Lumpectomy and radaition. Refused antiestrogen therapy

## 2020-07-08 NOTE — Assessment & Plan Note (Signed)
Possible mild early changes.. seen on chest CT.  Looking into pulmonary rehab.

## 2020-07-08 NOTE — Assessment & Plan Note (Signed)
Encouraged exercise, weight loss, healthy eating habits. ? ?

## 2020-07-08 NOTE — Assessment & Plan Note (Signed)
On Xarelto 

## 2020-07-08 NOTE — Assessment & Plan Note (Signed)
Followed by rheumatology. 

## 2020-07-08 NOTE — Assessment & Plan Note (Signed)
Well controlled. Continue current medication.  

## 2020-07-08 NOTE — Assessment & Plan Note (Signed)
On xarelto.. send for pelvic US.. may need GYN referral

## 2020-07-09 ENCOUNTER — Ambulatory Visit: Payer: Self-pay

## 2020-07-15 ENCOUNTER — Other Ambulatory Visit: Payer: Medicare Other

## 2020-07-16 ENCOUNTER — Telehealth: Payer: Self-pay | Admitting: Hematology and Oncology

## 2020-07-16 ENCOUNTER — Other Ambulatory Visit: Payer: Self-pay

## 2020-07-16 ENCOUNTER — Inpatient Hospital Stay: Payer: Medicare Other | Attending: Hematology and Oncology

## 2020-07-16 DIAGNOSIS — Z86718 Personal history of other venous thrombosis and embolism: Secondary | ICD-10-CM | POA: Diagnosis not present

## 2020-07-16 DIAGNOSIS — M069 Rheumatoid arthritis, unspecified: Secondary | ICD-10-CM | POA: Diagnosis not present

## 2020-07-16 DIAGNOSIS — Z923 Personal history of irradiation: Secondary | ICD-10-CM | POA: Insufficient documentation

## 2020-07-16 DIAGNOSIS — D0511 Intraductal carcinoma in situ of right breast: Secondary | ICD-10-CM | POA: Diagnosis present

## 2020-07-16 DIAGNOSIS — I2699 Other pulmonary embolism without acute cor pulmonale: Secondary | ICD-10-CM | POA: Diagnosis not present

## 2020-07-16 DIAGNOSIS — Z7901 Long term (current) use of anticoagulants: Secondary | ICD-10-CM | POA: Insufficient documentation

## 2020-07-16 NOTE — Telephone Encounter (Signed)
Called and left msg for patient to call back to r/s 10/11 appt per sch msg.

## 2020-07-17 NOTE — Progress Notes (Signed)
Patient Care Team: [name], MD as PCP - General Josue Hector, MD as PCP - Cardiology (Cardiology) Rosita Kea, PA-C as Physician Assistant (Rheumatology) Christain Sacramento, [place] as Referring Physician (Optometry) Danella Sensing, MD as Consulting Physician (Dermatology) Mauro Kaufmann, RN as Oncology Nurse Navigator Rockwell Germany, RN as Oncology Nurse Navigator  DIAGNOSIS:    ICD-10-CM   1. Ductal carcinoma in situ (DCIS) of right breast  D05.11     SUMMARY OF ONCOLOGIC HISTORY: Oncology History  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/27/2019 Initial Diagnosis   Patient reported two weeks of spontaneous right nipple discharge. 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 Biopsy on 07/05/19 showed fibrocystic changes in the left breast, no evidence of malignancy. Breast MRI on 08/11/19 showed a 1.7cm right nipple base mass, a 0.9cm superior central right breast mass, and indeterminate non-mass enhancement in the central left breast. Biopsy on 08/24/19 showed no evidence of malignancy in the left breast, and in the right breast, DCIS with necrosis, high grade, ER+ 100%, PR+ 70%.    09/20/2019 Surgery   Bilateral lumpectomies ([facility]): Left breast: complex sclerosing lesion with no evidence of malignancy Right breast: nipple adenoma with intraductal papilloma and DCIS, intermediate grade, 0.4cm, clear margins.   11/28/2019 -  Radiation Therapy   Adjuvant radiation     CHIEF COMPLIANT: Follow-up of right breast DCIS, bilateral PE  INTERVAL HISTORY: [name] is a 79 y.o. with above-mentioned history of right breast DCIS treated with lumpectomy, radiation, and who declined antiestrogen therapy due to a history of superficial venous thrombosis. She also has a history of bilateral pulmonary emboli, for which she is currently on  anticoagulation with Xarelto. She presents to the clinic todayfor follow-up.  ALLERGIES:  is allergic to codeine and hydrocodone-homatropine.  MEDICATIONS:  Current Outpatient Medications  Medication Sig Dispense Refill  . acetaminophen (TYLENOL) 500 MG tablet Take 1,000 mg by mouth 3 (three) times daily.    . Adalimumab (HUMIRA PEN ) Inject 40 mg into the skin every 14 (fourteen) days.     . Cholecalciferol (VITAMIN D3) 1.25 MG (50000 UT) TABS Take 1 tablet by mouth every 7 (seven) days. 12 tablet 0  . folic acid (FOLVITE) 1 MG tablet Take 1 mg by mouth daily.     . hydrochlorothiazide (HYDRODIURIL) 25 MG tablet Take 12.5 mg by mouth every other day.    . mometasone (ELOCON) 0.1 % ointment Apply topically 2 (two) times daily. 45 g 1  . rivaroxaban (XARELTO) 20 MG TABS tablet Take 1 tablet (20 mg total) by mouth daily with supper. 90 tablet 3   No current facility-administered medications for this visit.    PHYSICAL EXAMINATION: ECOG PERFORMANCE STATUS: 1 - Symptomatic but completely ambulatory  Vitals:   07/18/20 1132  BP: (!) 149/72  Pulse: 74  Resp: 17  Temp: 98.3 F (36.8 C)  SpO2: 97%   Filed Weights   07/18/20 1132  Weight: 213 lb 4.8 oz (96.8 kg)    BREAST: No palpable masses or nodules in either right or left breasts. No palpable axillary supraclavicular or infraclavicular adenopathy no breast tenderness or nipple discharge. (exam performed in the presence of a chaperone)  LABORATORY DATA:  I have reviewed the data as listed CMP Latest Ref Rng & Units 05/17/2020 04/02/2020 04/01/2020  Glucose 70 -  99 mg/dL 114(H) 103(H) 102(H)  BUN 6 - 23 mg/dL 14 8 7(L)  Creatinine 0.40 - 1.20 mg/dL 0.77 0.59 0.69  Sodium 135 - 145 mEq/L 141 140 141  Potassium 3.5 - 5.1 mEq/L 4.0 3.7 3.9  Chloride 96 - 112 mEq/L 104 106 106  CO2 19 - 32 mEq/L 29 24 24   Calcium 8.4 - 10.5 mg/dL 9.3 8.6(L) 8.9  Total Protein 6.0 - 8.3 g/dL 7.3 - 7.9  Total Bilirubin 0.2 - 1.2 mg/dL 0.5 - 0.5   Alkaline Phos 39 - 117 U/L 82 - 79  AST 0 - 37 U/L 24 - 28  ALT 0 - 35 U/L 21 - 28    Lab Results  Component Value Date   WBC 8.6 04/02/2020   HGB 12.8 04/02/2020   HCT 40.1 04/02/2020   MCV 97.1 04/02/2020   PLT 171 04/02/2020   NEUTROABS 4.9 04/01/2020    ASSESSMENT & PLAN:  Ductal carcinoma in situ (DCIS) of right breast 09/10/2019:Bilateral lumpectomies ([facility]): Left breast: complex sclerosing lesion with no evidence of malignancy Right breast: nipple adenoma with intraductal papilloma and DCIS, intermediate grade, 0.4cm, clear margins.ER 100%, PR 70% Tis NX stage 0  Treatment plan:Adjuvant radiation2/23/2021-12/19/2019 Patient decided not to take antiestrogen therapy because of her history of superficial venous thrombosis. 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  The patient has a good understanding of the overall plan. she agrees with it. she will call with any problems that may develop before the next visit here.  Total time spent: 30 mins including face to face time and time spent for planning, charting and coordination of care  Nicholas Lose, MD 07/18/2020  I, Cloyde Reams Dorshimer, am acting as scribe for Dr. Nicholas Lose.  I have reviewed the above documentation for accuracy and completeness, and I agree with the above.

## 2020-07-18 ENCOUNTER — Other Ambulatory Visit: Payer: Self-pay

## 2020-07-18 ENCOUNTER — Inpatient Hospital Stay (HOSPITAL_BASED_OUTPATIENT_CLINIC_OR_DEPARTMENT_OTHER): Payer: Medicare Other | Admitting: Hematology and Oncology

## 2020-07-18 DIAGNOSIS — D0511 Intraductal carcinoma in situ of right breast: Secondary | ICD-10-CM | POA: Diagnosis not present

## 2020-07-18 LAB — LUPUS ANTICOAGULANT PANEL
DRVVT: 57.9 s — ABNORMAL HIGH (ref 0.0–47.0)
PTT Lupus Anticoagulant: 37.4 s (ref 0.0–51.9)

## 2020-07-18 LAB — DRVVT MIX: dRVVT Mix: 48.6 s — ABNORMAL HIGH (ref 0.0–40.4)

## 2020-07-18 LAB — DRVVT CONFIRM: dRVVT Confirm: 1.4 ratio — ABNORMAL HIGH (ref 0.8–1.2)

## 2020-07-18 MED ORDER — RIVAROXABAN 20 MG PO TABS: 20.0000 mg | ORAL_TABLET | Freq: Every day | ORAL | 3 refills | Status: DC

## 2020-07-18 NOTE — Assessment & Plan Note (Signed)
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

## 2020-07-22 ENCOUNTER — Telehealth: Payer: Self-pay | Admitting: Hematology and Oncology

## 2020-07-22 NOTE — Telephone Encounter (Signed)
Scheduled per 10/14 los. Called and spoke with pt, confirmed added appt

## 2020-07-23 ENCOUNTER — Other Ambulatory Visit: Payer: Self-pay

## 2020-07-23 ENCOUNTER — Ambulatory Visit
Admission: RE | Admit: 2020-07-23 | Discharge: 2020-07-23 | Disposition: A | Discharge status: Home / Self Care | Payer: Medicare Other | Source: Ambulatory Visit | Attending: Hematology and Oncology | Admitting: Hematology and Oncology

## 2020-07-23 DIAGNOSIS — D0511 Intraductal carcinoma in situ of right breast: Secondary | ICD-10-CM

## 2020-07-23 HISTORY — DX: Personal history of irradiation: Z92.3

## 2020-08-14 ENCOUNTER — Ambulatory Visit: Payer: Medicare Other

## 2020-08-23 ENCOUNTER — Ambulatory Visit (INDEPENDENT_AMBULATORY_CARE_PROVIDER_SITE_OTHER): Payer: Medicare Other | Admitting: Podiatry

## 2020-08-23 ENCOUNTER — Other Ambulatory Visit: Payer: Self-pay

## 2020-08-23 DIAGNOSIS — M79674 Pain in right toe(s): Secondary | ICD-10-CM

## 2020-08-23 DIAGNOSIS — B351 Tinea unguium: Secondary | ICD-10-CM | POA: Diagnosis not present

## 2020-08-23 DIAGNOSIS — M79675 Pain in left toe(s): Secondary | ICD-10-CM | POA: Diagnosis not present

## 2020-08-27 ENCOUNTER — Encounter: Payer: Self-pay | Admitting: Podiatry

## 2020-08-27 NOTE — Progress Notes (Signed)
  Subjective:  Patient ID: [name], female    DOB: 1941-01-21,  MRN: [id number]  Chief Complaint  Patient presents with  . Nail Problem    Pt right hallux nail is thick and the left 3rd toe nail is painful   79 y.o. female returns for the above complaint.  Patient presents with thickened elongated dystrophic toenails x10.  Patient states the right hallux is pretty thick and hard to cut.  She states all of her toenails are little bit elongated/thickened and painful to touch.  She would like to have them debrided down.  She denies any other acute complaints.  She has not been able to ambulate because of the pain in the nails.  Objective:  There were no vitals filed for this visit. Podiatric Exam: Vascular: dorsalis pedis and posterior tibial pulses are palpable bilateral. Capillary return is immediate. Temperature gradient is WNL. Skin turgor WNL  Sensorium: Normal Semmes Weinstein monofilament test. Normal tactile sensation bilaterally. Nail Exam: Pt has thick disfigured discolored nails with subungual debris noted bilateral entire nail hallux through fifth toenails.  Pain on palpation to the nails. Ulcer Exam: There is no evidence of ulcer or pre-ulcerative changes or infection. Orthopedic Exam: Muscle tone and strength are WNL. No limitations in general ROM. No crepitus or effusions noted. HAV  B/L.  Hammer toes 2-5  B/L. Skin: No Porokeratosis. No infection or ulcers    Assessment & Plan:  No diagnosis found.  Patient was evaluated and treated and all questions answered.  Onychomycosis with pain  -Nails palliatively debrided as below. -Educated on self-care  Procedure: Nail Debridement Rationale: pain  Type of Debridement: manual, sharp debridement. Instrumentation: Nail nipper, rotary burr. Number of Nails: 10  Procedures and Treatment: Consent by patient was obtained for treatment procedures. The patient understood the discussion of treatment and procedures well.  All questions were answered thoroughly reviewed. Debridement of mycotic and hypertrophic toenails, 1 through 5 bilateral and clearing of subungual debris. No ulceration, no infection noted.  Return Visit-Office Procedure: Patient instructed to return to the office for a follow up visit 3 months for continued evaluation and treatment.  Boneta Lucks, DPM    No follow-ups on file.

## 2020-09-06 ENCOUNTER — Telehealth: Payer: Self-pay

## 2020-09-06 NOTE — Telephone Encounter (Signed)
Pt left v/m that she got the flushot on 08/13/20; I called back and spoke with pts husband who will have pt call with type flushot received.

## 2020-09-06 NOTE — Telephone Encounter (Signed)
Patient called in. She states that she got it at [facility] and she got "Senior Version" of the flu shot. She states she got her Covid vaccines on Pfzier 10/20/19; 11/10/19; 05/28/20

## 2020-09-06 NOTE — Telephone Encounter (Signed)
Immunization record updated.

## 2020-09-30 ENCOUNTER — Other Ambulatory Visit: Payer: Self-pay | Admitting: Family Medicine

## 2020-10-16 ENCOUNTER — Ambulatory Visit (HOSPITAL_COMMUNITY): Payer: Medicare Other

## 2020-10-17 ENCOUNTER — Ambulatory Visit: Payer: Medicare Other | Admitting: Hematology and Oncology

## 2020-10-31 ENCOUNTER — Telehealth (INDEPENDENT_AMBULATORY_CARE_PROVIDER_SITE_OTHER): Payer: Medicare Other | Admitting: Family Medicine

## 2020-10-31 ENCOUNTER — Emergency Department (HOSPITAL_COMMUNITY): Payer: Medicare Other

## 2020-10-31 ENCOUNTER — Encounter (HOSPITAL_COMMUNITY): Payer: Self-pay | Admitting: Family Medicine

## 2020-10-31 ENCOUNTER — Observation Stay (HOSPITAL_COMMUNITY)
Admission: EM | Admit: 2020-10-31 | Discharge: 2020-11-01 | Disposition: A | Discharge status: Home / Self Care | Payer: Medicare Other | Attending: Internal Medicine | Admitting: Internal Medicine

## 2020-10-31 ENCOUNTER — Encounter: Payer: Self-pay | Admitting: Family Medicine

## 2020-10-31 VITALS — Ht 64.0 in

## 2020-10-31 DIAGNOSIS — J9601 Acute respiratory failure with hypoxia: Principal | ICD-10-CM | POA: Insufficient documentation

## 2020-10-31 DIAGNOSIS — J849 Interstitial pulmonary disease, unspecified: Secondary | ICD-10-CM | POA: Diagnosis not present

## 2020-10-31 DIAGNOSIS — Z20822 Contact with and (suspected) exposure to covid-19: Secondary | ICD-10-CM | POA: Diagnosis not present

## 2020-10-31 DIAGNOSIS — Z86711 Personal history of pulmonary embolism: Secondary | ICD-10-CM | POA: Diagnosis not present

## 2020-10-31 DIAGNOSIS — R509 Fever, unspecified: Secondary | ICD-10-CM | POA: Diagnosis not present

## 2020-10-31 DIAGNOSIS — D849 Immunodeficiency, unspecified: Secondary | ICD-10-CM

## 2020-10-31 DIAGNOSIS — Z79899 Other long term (current) drug therapy: Secondary | ICD-10-CM | POA: Insufficient documentation

## 2020-10-31 DIAGNOSIS — Z853 Personal history of malignant neoplasm of breast: Secondary | ICD-10-CM | POA: Diagnosis not present

## 2020-10-31 DIAGNOSIS — Z96653 Presence of artificial knee joint, bilateral: Secondary | ICD-10-CM | POA: Diagnosis not present

## 2020-10-31 DIAGNOSIS — R7303 Prediabetes: Secondary | ICD-10-CM | POA: Insufficient documentation

## 2020-10-31 DIAGNOSIS — Z87891 Personal history of nicotine dependence: Secondary | ICD-10-CM | POA: Insufficient documentation

## 2020-10-31 DIAGNOSIS — R0902 Hypoxemia: Secondary | ICD-10-CM

## 2020-10-31 DIAGNOSIS — R1011 Right upper quadrant pain: Secondary | ICD-10-CM | POA: Insufficient documentation

## 2020-10-31 DIAGNOSIS — M069 Rheumatoid arthritis, unspecified: Secondary | ICD-10-CM | POA: Diagnosis not present

## 2020-10-31 DIAGNOSIS — I10 Essential (primary) hypertension: Secondary | ICD-10-CM | POA: Diagnosis present

## 2020-10-31 DIAGNOSIS — R0602 Shortness of breath: Secondary | ICD-10-CM | POA: Diagnosis present

## 2020-10-31 DIAGNOSIS — Z7901 Long term (current) use of anticoagulants: Secondary | ICD-10-CM | POA: Insufficient documentation

## 2020-10-31 LAB — BASIC METABOLIC PANEL
Anion gap: 13 (ref 5–15)
BUN: 9 mg/dL (ref 8–23)
CO2: 22 mmol/L (ref 22–32)
Calcium: 8.9 mg/dL (ref 8.9–10.3)
Chloride: 101 mmol/L (ref 98–111)
Creatinine, Ser: 0.73 mg/dL (ref 0.44–1.00)
GFR, Estimated: >60 mL/min (ref >60)
Glucose, Bld: 106 mg/dL — ABNORMAL HIGH (ref 70–99)
Potassium: 3.9 mmol/L (ref 3.5–5.1)
Sodium: 136 mmol/L (ref 135–145)

## 2020-10-31 LAB — CBC
HCT: 36.7 % (ref 36.0–46.0)
Hemoglobin: 12.2 g/dL (ref 12.0–15.0)
MCH: 32.6 pg (ref 26.0–34.0)
MCHC: 33.2 g/dL (ref 30.0–36.0)
MCV: 98.1 fL (ref 80.0–100.0)
Platelets: 276 10*3/uL (ref 150–400)
RBC: 3.74 MIL/uL — ABNORMAL LOW (ref 3.87–5.11)
RDW: 16.1 % — ABNORMAL HIGH (ref 11.5–15.5)
WBC: 8.6 10*3/uL (ref 4.0–10.5)
nRBC: 0 % (ref 0.0–0.2)

## 2020-10-31 LAB — HEPATIC FUNCTION PANEL
ALT: 30 U/L (ref 0–44)
AST: 32 U/L (ref 15–41)
Albumin: 3.4 g/dL — ABNORMAL LOW (ref 3.5–5.0)
Alkaline Phosphatase: 66 U/L (ref 38–126)
Bilirubin, Direct: 0.1 mg/dL (ref 0.0–0.2)
Indirect Bilirubin: 0.8 mg/dL (ref 0.3–0.9)
Total Bilirubin: 0.9 mg/dL (ref 0.3–1.2)
Total Protein: 7.1 g/dL (ref 6.5–8.1)

## 2020-10-31 LAB — URINALYSIS, ROUTINE W REFLEX MICROSCOPIC
Bilirubin Urine: NEGATIVE
Glucose, UA: NEGATIVE mg/dL
Ketones, ur: NEGATIVE mg/dL
Leukocytes,Ua: NEGATIVE
Nitrite: NEGATIVE
Protein, ur: 100 mg/dL — AB
Specific Gravity, Urine: 1.016 (ref 1.005–1.030)
pH: 5 (ref 5.0–8.0)

## 2020-10-31 LAB — D-DIMER, QUANTITATIVE: D-Dimer, Quant: 0.44 ug/mL-FEU (ref 0.00–0.50)

## 2020-10-31 LAB — SARS CORONAVIRUS 2 BY RT PCR (HOSPITAL ORDER, PERFORMED IN ~~LOC~~ HOSPITAL LAB): SARS Coronavirus 2: NEGATIVE

## 2020-10-31 LAB — TROPONIN I (HIGH SENSITIVITY)
Troponin I (High Sensitivity): 11 ng/L (ref <18)
Troponin I (High Sensitivity): 12 ng/L (ref <18)

## 2020-10-31 LAB — LIPASE, BLOOD: Lipase: 25 U/L (ref 11–51)

## 2020-10-31 MED ORDER — RIVAROXABAN 20 MG PO TABS: 20.0000 mg | ORAL_TABLET | Freq: Every day | ORAL | Status: DC

## 2020-10-31 MED ORDER — ACETAMINOPHEN 650 MG RE SUPP: 650.0000 mg | Freq: Four times a day (QID) | RECTAL | Status: DC | PRN

## 2020-10-31 MED ORDER — SENNOSIDES-DOCUSATE SODIUM 8.6-50 MG PO TABS: 1.0000 | ORAL_TABLET | Freq: Every evening | ORAL | Status: DC | PRN

## 2020-10-31 MED ORDER — ONDANSETRON HCL 4 MG/2ML IJ SOLN: 4.0000 mg | Freq: Four times a day (QID) | INTRAMUSCULAR | Status: DC | PRN

## 2020-10-31 MED ORDER — HYDROCHLOROTHIAZIDE 25 MG PO TABS
12.5000 mg | ORAL_TABLET | ORAL | Status: DC
  Filled 2020-10-31: qty 1

## 2020-10-31 MED ORDER — ACETAMINOPHEN 325 MG PO TABS
325.0000 mg | ORAL_TABLET | Freq: Once | ORAL | Status: AC
  Administered 2020-10-31: 325 mg via ORAL
  Filled 2020-10-31: qty 1

## 2020-10-31 MED ORDER — ACETAMINOPHEN 325 MG PO TABS: 650.0000 mg | ORAL_TABLET | Freq: Four times a day (QID) | ORAL | Status: DC | PRN

## 2020-10-31 MED ORDER — IOHEXOL 350 MG/ML SOLN
100.0000 mL | Freq: Once | INTRAVENOUS | Status: AC | PRN
  Administered 2020-10-31: 100 mL via INTRAVENOUS

## 2020-10-31 MED ORDER — ONDANSETRON HCL 4 MG PO TABS: 4.0000 mg | ORAL_TABLET | Freq: Four times a day (QID) | ORAL | Status: DC | PRN

## 2020-10-31 NOTE — ED Notes (Signed)
Ambulated pt to the bathroom, minimal shortness of breath. No difficulty with ambulation

## 2020-10-31 NOTE — ED Notes (Signed)
Pt walked in the hallway on pulse ox and cardiac monitor. Pts HR elevated to 125, RR 40s and sats 82% with ambulation around the ED. Pt remained SOB while toileting.  Eulas Post, PA_C and Dr. Laverta Baltimore aware.

## 2020-10-31 NOTE — Progress Notes (Signed)
Rachel Mcgann T. Brycin Kille, MD Primary Care and Sports Medicine [facility] [place], [address] Phone: [phone]  FAX: [place] - 80 y.o. female  MRN [id number]  Date of Birth: [birth year]  Visit Date: 10/31/2020  PCP: [name], MD  Referred by: [name], MD  Virtual Visit via Video Note:  I connected with  [name] on 10/31/2020 12:00 PM EST by a video enabled telemedicine application and verified that I am speaking with the correct person using two identifiers.   Location patient: home computer, tablet, or smartphone Location provider: work or home office Consent: Verbal consent directly obtained from [name]. Persons participating in the virtual visit: patient, provider  I discussed the limitations of evaluation and management by telemedicine and the availability of in person appointments. The patient expressed understanding and agreed to proceed.  Chief Complaint  Patient presents with  . Chills    Got Negative Covid results today-Thinks it may be coming from the methotrexate  . Catch in Right SIde Rib Cage  . Fatigue       . Nausea    History of Present Illness:  Multiple symptoms as below. ILD followed by Dr. Chase Caller. H/o b pe She has a very complicated medical history.  She is on MTX for RA. At age 53, the patient feels she is more sick than she ever has in her entire life. At the beginning she started to have some nausea and headache, and she started to then get some pain behind her ribs.  She denies fever and chills.  At this point she is not able to walk much, but at baseline she is.  She does have a pulse oximeter at home, and her pulse ox was approximately 88%.  She also tells me that her pulse is approximately 100.  Never felt this sick - 2 weeks now.  Started with nausea, headache.  Developed a pain behind her rib cage.  No fever,  terrible chills.   Called Rheum - a month and a half ago was put back on MTX.  Do not take last dose.  If feels better, then drop down to 3 pills compared to 6.  Not walking much.  Normally she can walk her dog and walk up and down her street twice a day.  Right now she is basically unable to walk at all in and about her home without being significantly short of breath. 88% 02. Pulse has gone to 100. Took Covid test.  Review of Systems as above: See pertinent positives and pertinent negatives per HPI No acute distress verbally  Patient Active Problem List   Diagnosis Date Noted  . Fever 10/31/2020  . Acute respiratory failure with hypoxia ([place]) 10/31/2020  . Post-menopausal bleeding 07/08/2020  . History of pulmonary embolism 04/01/2020  . 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 Peripheral edema 02/09/2019  . Chronic fatigue 11/24/2018  . ILD (interstitial lung disease) ([place]) 11/24/2018  . Pulmonary nodule 11/24/2018  . Chronic diarrhea 05/11/2017  . Rheumatoid arthritis ([place]) 01/24/2016  . Counseling regarding end of life decision making 09/11/2015  . Chronic low back pain 09/11/2015  . Severe obesity with body mass index (BMI) of 36.0 to 36.9 with serious comorbidity ([place]) 10/12/2012  . 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/12/2012  . PSVT (paroxysmal supraventricular tachycardia) ([place]) 02/15/2012  . Dizziness 06/13/2008  . Prediabetes 01/10/2008  .  Hyperlipidemia 01/04/2008  . Hypertension 01/04/2008  . GERD 01/04/2008  . OSTEOARTHRITIS 01/04/2008  . SUPERFICIAL THROMBOPHLEBITIS 10/05/1988    Past Medical History:  Diagnosis Date  . Cancer [facility] 2)    breast  . Cataract    diagnosed by Dr. Phineas Douglas  . Dysrhythmia    PSVT/ palpitations-  controlled with prn Inderal  . GERD (gastroesophageal reflux disease)    30 years ago.   Marland Kitchen Hyperlipidemia    statin intolerant  . Hypertension    eccho 4/13 EPIC  . Migraines   . Obesity   . Osteoarthritis   . Personal history of  radiation therapy    right breast  . Phlebitis of leg, right, superficial 1986/1989   x 2    Past Surgical History:  Procedure Laterality Date  . BREAST BIOPSY Left 07/05/2019  . BREAST BIOPSY Bilateral 08/24/2019  . BREAST EXCISIONAL BIOPSY Left 09/20/2019  . BREAST LUMPECTOMY Right 09/20/2019  . BREAST LUMPECTOMY WITH RADIOACTIVE SEED LOCALIZATION Bilateral 09/20/2019   Procedure: BILATERAL BREAST LUMPECTOMIES WITH BILATERAL RADIOACTIVE SEED LOCALIZATION AND RIGHT NIPPLE BIOPSY;  Surgeon: Erroll Luna, MD;  Location: [facility];  Service: General;  Laterality: Bilateral;  . CARDIAC CATHETERIZATION  1990s   negative  . CARDIOVASCULAR STRESS TEST  2008   NML  . CARDIOVASCULAR STRESS TEST  02/02/2007   EF 70%, NO ISCHEMIA  . DILATION AND CURETTAGE, DIAGNOSTIC / THERAPEUTIC  2008  . LESION DESTRUCTION  10/2017   Face; Dr. Ronnald Ramp  . ROTATOR CUFF REPAIR  1999   Right, left in 2002  . 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 US ECHOCARDIOGRAPHY  02/07/2007   EF 55-60%    Family History  Problem Relation Age of Onset  . Heart attack Father 50       PUD  . Hypertension Father   . Lung cancer Brother   . Heart defect Sister   . Hypertension Mother   . Coronary artery disease Other        Female 1st degree relative <50      Observations/Objective/Exam:  An attempt was made to discern vital signs over the phone and per patient if applicable and possible.   General:    Alert, Oriented, appears well and in no acute distress  Pulmonary:     On inspection no signs of respiratory distress.  Psych / Neurological:     Pleasant and cooperative.  Assessment and Plan:    ICD-10-CM   1. Acute respiratory failure with hypoxia (HCC)  J96.01   2. Suspected COVID-19 virus infection  Z20.822   3. ILD  (interstitial lung disease) ([place])  J84.9   4. Rheumatoid arthritis, involving unspecified site, unspecified whether rheumatoid factor present ([place])  M06.9   5. Acquired immunocompromised state ([place])  D84.9    This episode presents as a potentially life-threatening event with the patient in acute hypoxic respiratory failure in the setting of interstitial lung disease.  She also has bilateral pulmonary emboli, while she is on Xarelto.  She also is on methotrexate for rheumatoid arthritis, and this because a relative immunocompromise state.  I discussed this case with one of my partners, and we were in agreement that the patient needs to go to emergency room for evaluation.  I then called the emergency room, spoke to a  provider, and the patient is on route right now.  I discussed the assessment and treatment plan with the patient. The patient was provided an opportunity to ask questions and all were answered. The patient agreed with the plan and demonstrated an understanding of the instructions.   The patient was advised to call back or seek an in-person evaluation if the symptoms worsen or if the condition fails to improve as anticipated.  Follow-up: prn unless noted otherwise below No follow-ups on file.  No orders of the defined types were placed in this encounter.  No orders of the defined types were placed in this encounter.   Signed,  Maud Deed. Garion Wempe, MD

## 2020-10-31 NOTE — ED Triage Notes (Signed)
Pt here with c/o sob and chest tightness for 2 weeks ,negative rapid covid test , fever in triage

## 2020-10-31 NOTE — ED Provider Notes (Signed)
[place] EMERGENCY DEPARTMENT Provider Note   CSN: [id number] Arrival date & time: 10/31/20  1244     History Chief Complaint  Patient presents with  . Shortness of Breath   [name] is a 80 y.o. 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  Reports increased fatigue, increased sleeping, nausea, decreased appetite.  Minimal food intake.  Reports 5 pound weight loss in 1 week from not eating.  Initially thought that the symptoms were related to starting methotrexate for her RA.  2 days ago she developed sharp, intermittent, mild to moderate pain in the right rib/flank.  This is intermittent and worse when she takes deep breaths, moves or twists her body around.  Currently does not have any pain there.  Reports chronic mild cough and exertional shortness of breath due to her interstitial lung disease but states this is at her baseline is not any worse.  "Maybe a little worse". Still walks around home and goes on walks outside. No oxygen at home. Febrile in triage to 101.6.  Has had some chills.  Denies vomiting, diarrhea, constipation.  Denies dysuria, hematuria, urinary frequency.  Reports chronic urinary incontinence for a long time, wears depends.  No upper respiratory infection symptoms like congestion, rhinorrhea, sore throat.  Chronic cough, unchanged.  Has been compliant with her Xarelto without missing any doses.  No sick contacts.  Has had 2 Covid vaccines with a booster.  No abdominal surgeries.  Has had 2 UTIs in the past but no kidney infections.  HPI     Past Medical History:  Diagnosis Date  . Cancer [facility])    breast  . Cataract    diagnosed by Dr. Phineas Douglas  . Dysrhythmia    PSVT/  palpitations-  controlled with prn Inderal  . GERD (gastroesophageal reflux disease)    30 years ago.   Marland Kitchen Hyperlipidemia    statin intolerant  . Hypertension    eccho 4/13 EPIC  . Migraines   . Obesity   . Osteoarthritis   . Personal history of radiation therapy    right breast  . Phlebitis of leg, right, superficial 1986/1989   x 2    Patient Active Problem List   Diagnosis Date Noted  . Post-menopausal bleeding 07/08/2020  . Bilateral pulmonary embolism ([place]) 04/01/2020  . 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 Peripheral edema 02/09/2019  . Chronic fatigue 11/24/2018  . ILD (interstitial lung disease) ([place]) 11/24/2018  . Pulmonary nodule 11/24/2018  . Chronic diarrhea 05/11/2017  . Rheumatoid arthritis ([place]) 01/24/2016  . Counseling regarding end of life decision making 09/11/2015  . Chronic low back pain 09/11/2015  . Severe obesity with body mass index (BMI) of 36.0 to 36.9 with serious comorbidity ([place]) 10/12/2012  . 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/12/2012  . PSVT (paroxysmal supraventricular tachycardia) ([place]) 02/15/2012  . Dizziness 06/13/2008  . Prediabetes 01/10/2008  . Hyperlipidemia 01/04/2008  . Essential hypertension, benign 01/04/2008  . GERD 01/04/2008  . OSTEOARTHRITIS 01/04/2008  . SUPERFICIAL THROMBOPHLEBITIS 10/05/1988    Past Surgical History:  Procedure Laterality Date  . BREAST BIOPSY Left 07/05/2019  . BREAST BIOPSY Bilateral 08/24/2019  . BREAST EXCISIONAL BIOPSY Left 09/20/2019  . BREAST LUMPECTOMY Right 09/20/2019  . BREAST LUMPECTOMY  WITH RADIOACTIVE SEED LOCALIZATION Bilateral 09/20/2019   Procedure: BILATERAL BREAST LUMPECTOMIES WITH BILATERAL RADIOACTIVE SEED LOCALIZATION AND RIGHT NIPPLE BIOPSY;  Surgeon: Erroll Luna, MD;  Location: [facility];  Service: General;  Laterality: Bilateral;  . CARDIAC CATHETERIZATION  1990s   negative  . CARDIOVASCULAR STRESS TEST  2008   NML  . CARDIOVASCULAR STRESS TEST   02/02/2007   EF 70%, NO ISCHEMIA  . DILATION AND CURETTAGE, DIAGNOSTIC / THERAPEUTIC  2008  . LESION DESTRUCTION  10/2017   Face; Dr. Ronnald Ramp  . ROTATOR CUFF REPAIR  1999   Right, left in 2002  . 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 US ECHOCARDIOGRAPHY  02/07/2007   EF 55-60%     OB History   No obstetric history on file.     Family History  Problem Relation Age of Onset  . Heart attack Father 61       PUD  . Hypertension Father   . Lung cancer Brother   . Heart defect Sister   . Hypertension Mother   . Coronary artery disease Other        Female 1st degree relative <50    Social History   Tobacco Use  . Smoking status: Former Smoker    Packs/day: 1.00    Years: 5.00    Pack years: 5.00    Quit date: 03/15/1978    Years since quitting: 42.6  . Smokeless tobacco: Never Used  . Tobacco comment: quit smoking 40 years ago  Vaping Use  . Vaping Use: Never used  Substance Use Topics  . Alcohol use: No    Comment: stopped 5 years ago  . Drug use: No    Home Medications Prior to Admission medications   Medication Sig Start Date End Date Taking? Authorizing Provider  acetaminophen (TYLENOL) 500 MG tablet Take 1,000 mg by mouth in the morning and at bedtime.    [provider]  folic acid (FOLVITE) 1 MG tablet Take 2 mg by mouth daily.    [provider]  hydrochlorothiazide (HYDRODIURIL) 25 MG tablet Take 0.5 tablets (12.5 mg total) by mouth every other day. 09/30/20   Bedsole, Amy E, MD  methotrexate 2.5 MG tablet Take 15 mg by mouth once a week. 10/02/20   [provider]  rivaroxaban (XARELTO) 20 MG TABS tablet Take 1 tablet (20 mg total) by mouth daily with supper. 07/18/20   Nicholas Lose, MD    Allergies    Codeine and Hydrocodone-homatropine  Review of Systems   Review  of Systems  Constitutional: Positive for appetite change, chills, diaphoresis and fatigue.  Respiratory: Positive for cough (chronic unchanged) and shortness of breath (chronic, unchanged).   Cardiovascular: Positive for chest pain (right rib/flank).  Gastrointestinal: Positive for nausea.  Genitourinary: Positive for flank pain.  Hematological: Bruises/bleeds easily.  All other systems reviewed and are negative.   Physical Exam Updated Vital Signs BP (!) 149/76   Pulse 92   Temp 99.4 F (37.4 C) (Oral)   Resp (!) 22   SpO2 92%   Physical Exam Vitals and nursing note reviewed.  Constitutional:      Appearance: She is well-developed.     Comments: Non toxic in NAD  HENT:     Head: Normocephalic and atraumatic.  Nose: Nose normal.  Eyes:     Conjunctiva/sclera: Conjunctivae normal.  Cardiovascular:     Rate and Rhythm: Normal rate and regular rhythm.     Comments: No LE edema, calf tenderness  Pulmonary:     Effort: Pulmonary effort is normal.     Breath sounds: Normal breath sounds.     Comments: Speaking in full sentences. SpO2 >95% on RA.  Diminished lung sound lower lobes bilaterally L>R. No wheezing, crackles or rhonchi. Patient reports worsening pain in posterior/right rib/flank area with deep breathing  Abdominal:     General: Bowel sounds are normal.     Palpations: Abdomen is soft.     Tenderness: There is abdominal tenderness.     Comments: Mild RUQ, right lateral abdominal tenderness with deep palpation. No G/R/R. No suprapubic or CVA tenderness. Negative Murphy's and McBurney's.  Musculoskeletal:        General: Normal range of motion.     Cervical back: Normal range of motion.  Skin:    General: Skin is warm and dry.     Capillary Refill: Capillary refill takes less than 2 seconds.  Neurological:     Mental Status: She is alert.  Psychiatric:        Behavior: Behavior normal.     ED Results / Procedures / Treatments   Labs (all labs ordered are  listed, but only abnormal results are displayed) Labs Reviewed  BASIC METABOLIC PANEL - Abnormal; Notable for the following components:      Result Value   Glucose, Bld 106 (*)    All other components within normal limits  CBC - Abnormal; Notable for the following components:   RBC 3.74 (*)    RDW 16.1 (*)    All other components within normal limits  URINALYSIS, ROUTINE W REFLEX MICROSCOPIC - Abnormal; Notable for the following components:   Hgb urine dipstick SMALL (*)    Protein, ur 100 (*)    Bacteria, UA RARE (*)    All other components within normal limits  HEPATIC FUNCTION PANEL - Abnormal; Notable for the following components:   Albumin 3.4 (*)    All other components within normal limits  SARS CORONAVIRUS 2 BY RT PCR (HOSPITAL ORDER, [facility])  URINE CULTURE  CULTURE, BLOOD (ROUTINE X 2)  CULTURE, BLOOD (ROUTINE X 2)  D-DIMER, QUANTITATIVE (NOT AT [facility])  LIPASE, BLOOD  INFLUENZA PANEL BY PCR (TYPE A & B)  TROPONIN I (HIGH SENSITIVITY)  TROPONIN I (HIGH SENSITIVITY)    EKG EKG Interpretation  Date/Time:  Thursday October 31 2020 12:49:14 EST Ventricular Rate:  93 PR Interval:  156 QRS Duration: 72 QT Interval:  338 QTC Calculation: 420 R Axis:   49 Text Interpretation: Normal sinus rhythm Nonspecific ST cahaanges Abnormal ECG Confirmed by Nanda Quinton [phone]) on 10/31/2020 3:10:22 PM   Radiology CT Angio Chest PE W and/or Wo Contrast  Result Date: 10/31/2020 CLINICAL DATA:  Shortness of breath, chest tightness, right rib pain, right flank pain EXAM: CT ANGIOGRAPHY CHEST WITH CONTRAST TECHNIQUE: Multidetector CT imaging of the chest was performed using the standard protocol during bolus administration of intravenous contrast. Multiplanar CT image reconstructions and MIPs were obtained to evaluate the vascular anatomy. CONTRAST:  19mL OMNIPAQUE IOHEXOL 350 MG/ML SOLN COMPARISON:  04/01/2020 FINDINGS: Cardiovascular: No filling defects in  the pulmonary arteries to suggest pulmonary emboli. Mild cardiomegaly. Coronary artery and aortic calcifications. No aneurysm. Mediastinum/Nodes: Small scattered mediastinal lymph nodes, none pathologically enlarged. No  mediastinal, hilar, or axillary adenopathy. Trachea and esophagus are unremarkable. Thyroid unremarkable. Lungs/Pleura: Biapical and bibasilar scarring. No confluent opacities or effusions. Upper Abdomen: Imaging into the upper abdomen demonstrates no acute findings. Musculoskeletal: Chest wall soft tissues are unremarkable. No acute bony abnormality. Review of the MIP images confirms the above findings. IMPRESSION: No evidence of pulmonary embolus. No acute cardiopulmonary disease. Coronary artery disease. Aortic Atherosclerosis (ICD10-I70.0). Electronically Signed   By: Rolm Baptise M.D.   On: 10/31/2020 19:58   CT ABDOMEN PELVIS W CONTRAST  Result Date: 10/31/2020 CLINICAL DATA:  Right rib, flank pain, fever EXAM: CT ABDOMEN AND PELVIS WITH CONTRAST TECHNIQUE: Multidetector CT imaging of the abdomen and pelvis was performed using the standard protocol following bolus administration of intravenous contrast. CONTRAST:  138mL OMNIPAQUE IOHEXOL 350 MG/ML SOLN COMPARISON:  None. FINDINGS: Lower chest: Bibasilar scarring.  No acute abnormality. Hepatobiliary: Diffuse low-density throughout the liver compatible with fatty infiltration. 2.2 cm gallstone within the gallbladder. No biliary ductal dilatation. Pancreas: No focal abnormality or ductal dilatation. Spleen: No focal abnormality.  Normal size. Adrenals/Urinary Tract: No adrenal abnormality. No focal renal abnormality. No stones or hydronephrosis. Urinary bladder is unremarkable. Stomach/Bowel: Normal appendix. Stomach, large and small bowel grossly unremarkable. Vascular/Lymphatic: Aortic atherosclerosis. No evidence of aneurysm or adenopathy. Reproductive: Uterus and adnexa unremarkable.  No mass. Other: No free fluid or free air.  Musculoskeletal: No acute bony abnormality. IMPRESSION: Cholelithiasis.  No CT evidence of acute cholecystitis. Hepatic steatosis. Aortic atherosclerosis. Electronically Signed   By: Rolm Baptise M.D.   On: 10/31/2020 19:53   DG Chest Portable 1 View  Result Date: 10/31/2020 CLINICAL DATA:  Cough and fever with chest pain EXAM: PORTABLE CHEST 1 VIEW COMPARISON:  April 01, 2020 chest radiograph and CT angiogram chest FINDINGS: There is slight atelectasis in the left base. Lungs elsewhere are clear. Heart size and pulmonary vascularity are normal. No adenopathy. There is aortic atherosclerosis. Surgical clips noted in each breast region. Stable chronic erosion in each lateral clavicle. IMPRESSION: Mild left base atelectasis. Lungs elsewhere clear. Stable cardiac silhouette. Aortic Atherosclerosis (ICD10-I70.0). Electronically Signed   By: Lowella Grip III M.D.   On: 10/31/2020 13:20   US Abdomen Limited RUQ (LIVER/GB)  Result Date: 10/31/2020 CLINICAL DATA:  Right upper quadrant pain EXAM: ULTRASOUND ABDOMEN LIMITED RIGHT UPPER QUADRANT COMPARISON:  CT 10/31/2020 FINDINGS: Gallbladder: Shadowing stone measuring up to 3 cm. Normal wall thickness. Negative sonographic Murphy Common bile duct: Diameter: 3 mm Liver: Diffusely echogenic liver. No focal hepatic abnormality portal vein is patent on color Doppler imaging with normal direction of blood flow towards the liver. Other: None. IMPRESSION: 1. Cholelithiasis without sonographic evidence for acute cholecystitis. 2. Diffusely echogenic liver consistent with steatosis. Electronically Signed   By: Donavan Foil M.D.   On: 10/31/2020 20:56    Procedures .Critical Care Performed by: Kinnie Feil, PA-C Authorized by: Kinnie Feil, PA-C   Critical care provider statement:    Critical care time (minutes):  45   Critical care was necessary to treat or prevent imminent or life-threatening deterioration of the following conditions:  Respiratory  failure   Critical care was time spent personally by me on the following activities:  Discussions with consultants, evaluation of patient's response to treatment, examination of patient, ordering and performing treatments and interventions, ordering and review of laboratory studies, ordering and review of radiographic studies, pulse oximetry, re-evaluation of patient's condition, obtaining history from patient or surrogate, review of old charts and development of treatment  plan with patient or surrogate   I assumed direction of critical care for this patient from another provider in my specialty: no       Medications Ordered in ED Medications  acetaminophen (TYLENOL) tablet 325 mg (325 mg Oral Given 10/31/20 1301)  iohexol (OMNIPAQUE) 350 MG/ML injection 100 mL (100 mLs Intravenous Contrast Given 10/31/20 1937)    ED Course  I have reviewed the triage vital signs and the nursing notes.  Pertinent labs & imaging results that were available during my care of the patient were reviewed by me and considered in my medical decision making (see chart for details).  Clinical Course as of 10/31/20 2200  Thu Oct 31, 2020  1510 DG Chest Portable 1 View IMPRESSION: Mild left base atelectasis. Lungs elsewhere clear. Stable cardiac silhouette.  Aortic Atherosclerosis (ICD10-I70.0).   [CG]  1921 Bacteria, UA(!): RARE [CG]  1922 Protein(!): 100 [CG]  1922 Hgb urine dipstick(!): SMALL [CG]  1922 AST: 32 [CG]  1922 ALT: 30 [CG]  1922 Alkaline Phosphatase: 66 [CG]  1922 Lipase: 25 [CG]    Clinical Course User Index [CG] Kinnie Feil, PA-C   MDM Rules/Calculators/A&P                            80 y.o. yo with chief complaint of generalized malaise, nausea, decreased appetite for the last 2 weeks.  Developed sharp right-sided rib pain/flank pain 2 days ago that is worse with movement, deep inspirations.  History of bilateral PE on Eliquis, compliant, ILD.  Vaccinated for COVID with booster.   Denies changes in chronic cough and exertional shortness of breath.  No GU symptoms.  No diarrhea or constipation.  Previous medical records available, triage and nursing notes reviewed to obtain more history and assist with MDM  Obtained additional information from daughter who is concerned patient may be minimizing SOB, reports patient appears more SOB than usual.   Chief complain involves an extensive number of treatment options and is a complaint that carries with it a high risk of complications and morbidity and mortality.    Differential diagnosis: Viral illness, Covid, pneumonia.  Her pain is in the right posterior/lateral rib cage and right upper quadrant abdomen.  It is possible for this to be cholecystitis, cholelithiasis or even UTI although she has no GU symptoms.  ER lab work and imaging ordered by triage RN and me, as above  I have personally visualized and interpreted ER diagnostic work up including labs and imaging.    Labs reveal -normal WBC, hemoglobin.  Covid test is negative.  D-dimer is negative.  Troponin is 12.  LFTs, lipase normal.  Urinalysis with rare bacteria, 100 protein, small hemoglobin.  Imaging reveals -chest x-ray shows possible atelectasis in the left side.  EKG is without acute or significant changes.  Medications ordered -Tylenol given in triage for fever  2145: Patient reevaluated.  Fever has resolved.  Reported continued but very mild right-sided flank/rib pain.  Given duration of symptoms, fever, age discussed with EDP.  CTA to evaluate for recurrent PE as well as CT of the abdomen and pelvis were ordered.  These were unremarkable for acute findings.  Given continued pain, fever initially, right upper quadrant ultrasound was obtained for better visualization of the gallbladder and gallstone.  This was also negative.  Lengthy discussion with patient and daughter, thoroughly explained work up thus far unrevealing.  During conversation patient SpO2 dropped to  86% with  good pleth wave.  Re-checked on other hand, SpO2 >90%.  RN ambulated patient and SpO2 dropped to 82% and patient very dyspnic.   Requesting admission for hypoxia.  Discussed with Dr Myna Hidalgo who will see patient. Influenza panel pending.  Final Clinical Impression(s) / ED Diagnoses Final diagnoses:  RUQ abdominal pain  Hypoxia    Rx / DC Orders ED Discharge Orders    None       Kinnie Feil, PA-C 10/31/20 2200    Margette Fast, MD 11/01/20 1153

## 2020-10-31 NOTE — H&P (Signed)
History and Physical    [name] FVO:[id number] DOB: 1941/03/02 DOA: 10/31/2020  PCP: [name], MD   Patient coming from: Home   Chief Complaint: Fatigue, malaise, loss of appetite, chills   HPI: [name] is a 80 y.o. female with medical history significant for history of PE on Xarelto, hypertension, breast cancer, rheumatoid arthritis, possible interstitial lung disease, presenting to the emergency department for evaluation of fatigue, malaise, loss of appetite, and chills.  Patient reports approximately 2 weeks of fatigue, general malaise, loss of appetite, chills, and aches.  She does not feel particularly short of breath and denies any cough, chest pain, palpitations, leg swelling, or orthopnea.  She had been walking every day but has not done that recently due to not feeling well and also because of the recent ice and snow.  She denies any abdominal pain, dysuria, or hematuria.  She has a mild discomfort at the right flank that is worse with deep breath.  Denies any fall or trauma.  ED Course: Upon arrival to the ED, patient is found to be febrile to 38.7 C, saturating low 90s on room air, and with stable blood pressure.  EKG features sinus rhythm with inferior and anterolateral ST-T abnormalities.  Chest x-ray with mild left base atelectasis.  CT abdomen and pelvis notable for cholelithiasis without acute cholecystitis.  CTA chest is negative for PE or other acute cardiopulmonary disease.  Right upper quadrant ultrasound again reveals cholelithiasis without evidence for acute cholecystitis.  Chemistry panel and CBC are unremarkable.  D-dimer was normal and Covid PCR negative.  High-sensitivity troponin is normal x2.  Patient was treated with Tylenol and orders were placed for blood and urine cultures and influenza testing.  Patient was ambulated in the emergency department and dropped her oxygen saturations to the low 80s.  Review of Systems:  All other systems  reviewed and apart from HPI, are negative.  Past Medical History:  Diagnosis Date  . Cancer [facility])    breast  . Cataract    diagnosed by Dr. Phineas Douglas  . Dysrhythmia    PSVT/ palpitations-  controlled with prn Inderal  . GERD (gastroesophageal reflux disease)    30 years ago.   Marland Kitchen Hyperlipidemia    statin intolerant  . Hypertension    eccho 4/13 EPIC  . Migraines   . Obesity   . Osteoarthritis   . Personal history of radiation therapy    right breast  . Phlebitis of leg, right, superficial 1986/1989   x 2    Past Surgical History:  Procedure Laterality Date  . BREAST BIOPSY Left 07/05/2019  . BREAST BIOPSY Bilateral 08/24/2019  . BREAST EXCISIONAL BIOPSY Left 09/20/2019  . BREAST LUMPECTOMY Right 09/20/2019  . BREAST LUMPECTOMY WITH RADIOACTIVE SEED LOCALIZATION Bilateral 09/20/2019   Procedure: BILATERAL BREAST LUMPECTOMIES WITH BILATERAL RADIOACTIVE SEED LOCALIZATION AND RIGHT NIPPLE BIOPSY;  Surgeon: Erroll Luna, MD;  Location: [facility];  Service: General;  Laterality: Bilateral;  . CARDIAC CATHETERIZATION  1990s   negative  . CARDIOVASCULAR STRESS TEST  2008   NML  . CARDIOVASCULAR STRESS TEST  02/02/2007   EF 70%, NO ISCHEMIA  . DILATION AND CURETTAGE, DIAGNOSTIC / THERAPEUTIC  2008  . LESION DESTRUCTION  10/2017   Face; Dr. Ronnald Ramp  . ROTATOR CUFF REPAIR  1999   Right, left in 2002  . 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 US ECHOCARDIOGRAPHY  02/07/2007   EF 55-60%    Social History:   reports that she quit smoking about 42 years ago. She has a 5.00 pack-year smoking history. She has never used smokeless tobacco. She reports that she does not drink alcohol and does not use drugs.  Allergies  Allergen Reactions  . Codeine Nausea And Vomiting  . Hydrocodone-Homatropine Other  (See Comments)    Hallucinations     Family History  Problem Relation Age of Onset  . Heart attack Father 31       PUD  . Hypertension Father   . Lung cancer Brother   . Heart defect Sister   . Hypertension Mother   . Coronary artery disease Other        Female 1st degree relative <50     Prior to Admission medications   Medication Sig Start Date End Date Taking? Authorizing Provider  acetaminophen (TYLENOL) 500 MG tablet Take 1,000 mg by mouth in the morning and at bedtime.    [provider]  folic acid (FOLVITE) 1 MG tablet Take 2 mg by mouth daily.    [provider]  hydrochlorothiazide (HYDRODIURIL) 25 MG tablet Take 0.5 tablets (12.5 mg total) by mouth every other day. 09/30/20   Bedsole, Amy E, MD  methotrexate 2.5 MG tablet Take 15 mg by mouth once a week. 10/02/20   [provider]  rivaroxaban (XARELTO) 20 MG TABS tablet Take 1 tablet (20 mg total) by mouth daily with supper. 07/18/20   Nicholas Lose, MD    Physical Exam: Vitals:   10/31/20 1900 10/31/20 2115 10/31/20 2115 10/31/20 2145  BP:   (!) 175/81 (!) 149/76  Pulse: 77 86  92  Resp: (!) 25 (!) 30  (!) 22  Temp:      TempSrc:      SpO2: 91% 92%  92%    Constitutional: NAD, calm  Eyes: PERTLA, lids and conjunctivae normal ENMT: Mucous membranes are moist. Posterior pharynx clear of any exudate or lesions.   Neck: normal, supple, no masses, no thyromegaly Respiratory:  no wheezing, no crackles. No accessory muscle use.  Cardiovascular: S1 & S2 heard, regular rate and rhythm. No extremity edema.   Abdomen: No distension, no tenderness, soft. Bowel sounds active.  Musculoskeletal: no clubbing / cyanosis. No joint deformity upper and lower extremities.   Skin: no significant rashes, lesions, ulcers. Warm, dry, well-perfused. Neurologic: CN 2-12 grossly intact. Sensation intact. Moving all extremities.  Psychiatric: Alert and oriented to person, place, and situation. Very pleasant  and cooperative.    Labs and Imaging on Admission: I have personally reviewed following labs and imaging studies  CBC: Recent Labs  Lab 10/31/20 1303  WBC 8.6  HGB 12.2  HCT 36.7  MCV 98.1  PLT 267   Basic Metabolic Panel: Recent Labs  Lab 10/31/20 1303  NA 136  K 3.9  CL 101  CO2 22  GLUCOSE 106*  BUN 9  CREATININE 0.73  CALCIUM 8.9   GFR: CrCl cannot be calculated (Unknown ideal weight.). Liver Function Tests: Recent Labs  Lab 10/31/20 1630  AST 32  ALT 30  ALKPHOS 66  BILITOT 0.9  PROT 7.1  ALBUMIN 3.4*   Recent Labs  Lab 10/31/20 1630  LIPASE 25   No results for input(s): AMMONIA in the last 168 hours. Coagulation Profile: No results for input(s): INR, PROTIME  in the last 168 hours. Cardiac Enzymes: No results for input(s): CKTOTAL, CKMB, CKMBINDEX, TROPONINI in the last 168 hours. BNP (last 3 results) No results for input(s): PROBNP in the last 8760 hours. HbA1C: No results for input(s): HGBA1C in the last 72 hours. CBG: No results for input(s): GLUCAP in the last 168 hours. Lipid Profile: No results for input(s): CHOL, HDL, LDLCALC, TRIG, CHOLHDL, LDLDIRECT in the last 72 hours. Thyroid Function Tests: No results for input(s): TSH, T4TOTAL, FREET4, T3FREE, THYROIDAB in the last 72 hours. Anemia Panel: No results for input(s): VITAMINB12, FOLATE, FERRITIN, TIBC, IRON, RETICCTPCT in the last 72 hours. Urine analysis:    Component Value Date/Time   COLORURINE YELLOW 10/31/2020 1426   APPEARANCEUR CLEAR 10/31/2020 1426   LABSPEC 1.016 10/31/2020 1426   PHURINE 5.0 10/31/2020 1426   GLUCOSEU NEGATIVE 10/31/2020 1426   HGBUR SMALL (A) 10/31/2020 1426   BILIRUBINUR NEGATIVE 10/31/2020 1426   BILIRUBINUR Negative 02/06/2020 1306   KETONESUR NEGATIVE 10/31/2020 1426   PROTEINUR 100 (A) 10/31/2020 1426   UROBILINOGEN 0.2 02/06/2020 1306   UROBILINOGEN 0.2 10/03/2012 1408   NITRITE NEGATIVE 10/31/2020 1426   LEUKOCYTESUR NEGATIVE 10/31/2020  1426   Sepsis Labs: '@LABRCNTIP' (procalcitonin:4,lacticidven:4) ) Recent Results (from the past 240 hour(s))  SARS Coronavirus 2 by RT PCR (hospital order, performed in [facility] hospital lab) Nasopharyngeal Nasopharyngeal Swab     Status: None   Collection Time: 10/31/20 12:54 PM   Specimen: Nasopharyngeal Swab  Result Value Ref Range Status   SARS Coronavirus 2 NEGATIVE NEGATIVE Final    Comment: (NOTE) SARS-CoV-2 target nucleic acids are NOT DETECTED.  The SARS-CoV-2 RNA is generally detectable in upper and lower respiratory specimens during the acute phase of infection. The lowest concentration of SARS-CoV-2 viral copies this assay can detect is 250 copies / mL. A negative result does not preclude SARS-CoV-2 infection and should not be used as the sole basis for treatment or other patient management decisions.  A negative result may occur with improper specimen collection / handling, submission of specimen other than nasopharyngeal swab, presence of viral mutation(s) within the areas targeted by this assay, and inadequate number of viral copies (<250 copies / mL). A negative result must be combined with clinical observations, patient history, and epidemiological information.  Fact Sheet for Patients:   StrictlyIdeas.no  Fact Sheet for Healthcare Providers: BankingDealers.co.za  This test is not yet approved or  cleared by the [place] FDA and has been authorized for detection and/or diagnosis of SARS-CoV-2 by FDA under an Emergency Use Authorization (EUA).  This EUA will remain in effect (meaning this test can be used) for the duration of the COVID-19 declaration under Section 564(b)(1) of the Act, 21 U.S.C. section 360bbb-3(b)(1), unless the authorization is terminated or revoked sooner.  Performed at [facility], [place] [address], [place], [place] [address]      Radiological Exams on Admission: CT Angio Chest  PE W and/or Wo Contrast  Result Date: 10/31/2020 CLINICAL DATA:  Shortness of breath, chest tightness, right rib pain, right flank pain EXAM: CT ANGIOGRAPHY CHEST WITH CONTRAST TECHNIQUE: Multidetector CT imaging of the chest was performed using the standard protocol during bolus administration of intravenous contrast. Multiplanar CT image reconstructions and MIPs were obtained to evaluate the vascular anatomy. CONTRAST:  178m OMNIPAQUE IOHEXOL 350 MG/ML SOLN COMPARISON:  04/01/2020 FINDINGS: Cardiovascular: No filling defects in the pulmonary arteries to suggest pulmonary emboli. Mild cardiomegaly. Coronary artery and aortic calcifications. No aneurysm. Mediastinum/Nodes: Small scattered  mediastinal lymph nodes, none pathologically enlarged. No mediastinal, hilar, or axillary adenopathy. Trachea and esophagus are unremarkable. Thyroid unremarkable. Lungs/Pleura: Biapical and bibasilar scarring. No confluent opacities or effusions. Upper Abdomen: Imaging into the upper abdomen demonstrates no acute findings. Musculoskeletal: Chest wall soft tissues are unremarkable. No acute bony abnormality. Review of the MIP images confirms the above findings. IMPRESSION: No evidence of pulmonary embolus. No acute cardiopulmonary disease. Coronary artery disease. Aortic Atherosclerosis (ICD10-I70.0). Electronically Signed   By: Rolm Baptise M.D.   On: 10/31/2020 19:58   CT ABDOMEN PELVIS W CONTRAST  Result Date: 10/31/2020 CLINICAL DATA:  Right rib, flank pain, fever EXAM: CT ABDOMEN AND PELVIS WITH CONTRAST TECHNIQUE: Multidetector CT imaging of the abdomen and pelvis was performed using the standard protocol following bolus administration of intravenous contrast. CONTRAST:  170m OMNIPAQUE IOHEXOL 350 MG/ML SOLN COMPARISON:  None. FINDINGS: Lower chest: Bibasilar scarring.  No acute abnormality. Hepatobiliary: Diffuse low-density throughout the liver compatible with fatty infiltration. 2.2 cm gallstone within the  gallbladder. No biliary ductal dilatation. Pancreas: No focal abnormality or ductal dilatation. Spleen: No focal abnormality.  Normal size. Adrenals/Urinary Tract: No adrenal abnormality. No focal renal abnormality. No stones or hydronephrosis. Urinary bladder is unremarkable. Stomach/Bowel: Normal appendix. Stomach, large and small bowel grossly unremarkable. Vascular/Lymphatic: Aortic atherosclerosis. No evidence of aneurysm or adenopathy. Reproductive: Uterus and adnexa unremarkable.  No mass. Other: No free fluid or free air. Musculoskeletal: No acute bony abnormality. IMPRESSION: Cholelithiasis.  No CT evidence of acute cholecystitis. Hepatic steatosis. Aortic atherosclerosis. Electronically Signed   By: KRolm BaptiseM.D.   On: 10/31/2020 19:53   DG Chest Portable 1 View  Result Date: 10/31/2020 CLINICAL DATA:  Cough and fever with chest pain EXAM: PORTABLE CHEST 1 VIEW COMPARISON:  April 01, 2020 chest radiograph and CT angiogram chest FINDINGS: There is slight atelectasis in the left base. Lungs elsewhere are clear. Heart size and pulmonary vascularity are normal. No adenopathy. There is aortic atherosclerosis. Surgical clips noted in each breast region. Stable chronic erosion in each lateral clavicle. IMPRESSION: Mild left base atelectasis. Lungs elsewhere clear. Stable cardiac silhouette. Aortic Atherosclerosis (ICD10-I70.0). Electronically Signed   By: WLowella GripIII M.D.   On: 10/31/2020 13:20   UKoreaAbdomen Limited RUQ (LIVER/GB)  Result Date: 10/31/2020 CLINICAL DATA:  Right upper quadrant pain EXAM: ULTRASOUND ABDOMEN LIMITED RIGHT UPPER QUADRANT COMPARISON:  CT 10/31/2020 FINDINGS: Gallbladder: Shadowing stone measuring up to 3 cm. Normal wall thickness. Negative sonographic Murphy Common bile duct: Diameter: 3 mm Liver: Diffusely echogenic liver. No focal hepatic abnormality portal vein is patent on color Doppler imaging with normal direction of blood flow towards the liver. Other: None.  IMPRESSION: 1. Cholelithiasis without sonographic evidence for acute cholecystitis. 2. Diffusely echogenic liver consistent with steatosis. Electronically Signed   By: KDonavan FoilM.D.   On: 10/31/2020 20:56    EKG: Independently reviewed. Sinus rhythm, inferior and anterolateral ST-T abnormality.   Assessment/Plan   1. Acute hypoxic respiratory failure  - Presents with fatigue, malaise, and fever and was found to drop oxygen saturations to low 80s with ambulation  - Patient follows with pulmonology for possible ILD, denies acute SOB or cough, COVID pcr is negative, and no acute findings noted on CTA chest  - Check ABG, monitor saturations, may need home O2   2. Fever  - Presents with fatigue, malaise, and fever without leukocytosis or apparent infectious process  - Blood and urine cultures were ordered from ED  - Follow cultures and  clinical course    3. Rheumatoid arthritis  - Possible that recent fatigue, malaise, and low-grade fevers could be related due to flare  - Check ESR and CRP    4. History of PE   - No PE on CTA in ED  - Continue Xarelto   5. Hypertension  - BP at goal, continue HCTZ     DVT prophylaxis: Xarelto   Code Status: Full  Level of Care: Level of care: Med-Surg Family Communication: Daughter updated at bedside Disposition Plan:  Patient is from: Home  Anticipated d/c is to: Home Anticipated d/c date is: 1/28 or 11/02/20 Patient currently: Pending ABG, cultures, may need home O2  Consults called: None  Admission status: Observation     Vianne Bulls, MD [facility]  10/31/2020, 10:46 PM

## 2020-11-01 ENCOUNTER — Encounter: Payer: Self-pay | Admitting: Family Medicine

## 2020-11-01 DIAGNOSIS — J9601 Acute respiratory failure with hypoxia: Secondary | ICD-10-CM

## 2020-11-01 DIAGNOSIS — M069 Rheumatoid arthritis, unspecified: Secondary | ICD-10-CM

## 2020-11-01 DIAGNOSIS — R509 Fever, unspecified: Secondary | ICD-10-CM | POA: Diagnosis not present

## 2020-11-01 DIAGNOSIS — Z86711 Personal history of pulmonary embolism: Secondary | ICD-10-CM | POA: Diagnosis not present

## 2020-11-01 DIAGNOSIS — J849 Interstitial pulmonary disease, unspecified: Secondary | ICD-10-CM

## 2020-11-01 DIAGNOSIS — I10 Essential (primary) hypertension: Secondary | ICD-10-CM

## 2020-11-01 LAB — CBC WITH DIFFERENTIAL/PLATELET
Abs Immature Granulocytes: 0.03 10*3/uL (ref 0.00–0.07)
Basophils Absolute: 0 10*3/uL (ref 0.0–0.1)
Basophils Relative: 0 %
Eosinophils Absolute: 0.2 10*3/uL (ref 0.0–0.5)
Eosinophils Relative: 2 %
HCT: 34 % — ABNORMAL LOW (ref 36.0–46.0)
Hemoglobin: 10.9 g/dL — ABNORMAL LOW (ref 12.0–15.0)
Immature Granulocytes: 0 %
Lymphocytes Relative: 34 %
Lymphs Abs: 2.6 10*3/uL (ref 0.7–4.0)
MCH: 31.5 pg (ref 26.0–34.0)
MCHC: 32.1 g/dL (ref 30.0–36.0)
MCV: 98.3 fL (ref 80.0–100.0)
Monocytes Absolute: 0.9 10*3/uL (ref 0.1–1.0)
Monocytes Relative: 11 %
Neutro Abs: 4.1 10*3/uL (ref 1.7–7.7)
Neutrophils Relative %: 53 %
Platelets: 240 10*3/uL (ref 150–400)
RBC: 3.46 MIL/uL — ABNORMAL LOW (ref 3.87–5.11)
RDW: 16 % — ABNORMAL HIGH (ref 11.5–15.5)
WBC: 7.8 10*3/uL (ref 4.0–10.5)
nRBC: 0 % (ref 0.0–0.2)

## 2020-11-01 LAB — BASIC METABOLIC PANEL
Anion gap: 14 (ref 5–15)
BUN: 9 mg/dL (ref 8–23)
CO2: 22 mmol/L (ref 22–32)
Calcium: 8.6 mg/dL — ABNORMAL LOW (ref 8.9–10.3)
Chloride: 99 mmol/L (ref 98–111)
Creatinine, Ser: 0.7 mg/dL (ref 0.44–1.00)
GFR, Estimated: >60 mL/min (ref >60)
Glucose, Bld: 92 mg/dL (ref 70–99)
Potassium: 3.4 mmol/L — ABNORMAL LOW (ref 3.5–5.1)
Sodium: 135 mmol/L (ref 135–145)

## 2020-11-01 LAB — RESP PANEL BY RT-PCR (RSV, FLU A&B, COVID)  RVPGX2
Influenza A by PCR: NEGATIVE
Influenza B by PCR: NEGATIVE
Resp Syncytial Virus by PCR: NEGATIVE
SARS Coronavirus 2 by RT PCR: NEGATIVE

## 2020-11-01 LAB — I-STAT ARTERIAL BLOOD GAS, ED
Acid-Base Excess: 2 mmol/L (ref 0.0–2.0)
Bicarbonate: 25.4 mmol/L (ref 20.0–28.0)
Calcium, Ion: 1.18 mmol/L (ref 1.15–1.40)
HCT: 32 % — ABNORMAL LOW (ref 36.0–46.0)
Hemoglobin: 10.9 g/dL — ABNORMAL LOW (ref 12.0–15.0)
O2 Saturation: 94 %
Potassium: 3.3 mmol/L — ABNORMAL LOW (ref 3.5–5.1)
Sodium: 138 mmol/L (ref 135–145)
TCO2: 26 mmol/L (ref 22–32)
pCO2 arterial: 35.1 mmHg (ref 32.0–48.0)
pH, Arterial: 7.467 — ABNORMAL HIGH (ref 7.350–7.450)
pO2, Arterial: 67 mmHg — ABNORMAL LOW (ref 83.0–108.0)

## 2020-11-01 LAB — SEDIMENTATION RATE: Sed Rate: 78 mm/hr — ABNORMAL HIGH (ref 0–22)

## 2020-11-01 LAB — C-REACTIVE PROTEIN: CRP: 7.9 mg/dL — ABNORMAL HIGH (ref <1.0)

## 2020-11-01 MED ORDER — PREDNISONE 5 MG PO TABS: ORAL_TABLET | ORAL | 0 refills | Status: AC

## 2020-11-01 MED ORDER — POTASSIUM CHLORIDE CRYS ER 20 MEQ PO TBCR
30.0000 meq | EXTENDED_RELEASE_TABLET | ORAL | Status: AC
  Administered 2020-11-01 (×2): 30 meq via ORAL
  Filled 2020-11-01 (×2): qty 1

## 2020-11-01 NOTE — ED Notes (Signed)
Pt given PO fluids.

## 2020-11-01 NOTE — TOC Transition Note (Signed)
Transition of Care [facility]) - CM/SW Discharge Note   Patient Details  Name: [name] MRN: [id number] Date of Birth: Mar 03, 1941  Transition of Care [facility] CM/SW Contact:  Pollie Friar, RN Phone Number: 11/01/2020, 10:41 AM   Clinical Narrative:    Patient is discharging home with self care. Pt did qualify for home oxygen. CM met with the patient and her daughter and they asked to use the same agency they have used in the past. CM found she used [employer] in June. CM has called in the orders to [facility]. They will deliver oxygen to the room for transport and concentrator to the home.  Pt has transportation home today.   Final next level of care: Home/Self Care Barriers to Discharge: No Barriers Identified   Patient Goals and CMS Choice   CMS Medicare.gov Compare Post Acute Care list provided to:: Patient Choice offered to / list presented to : Adult Children,Patient  Discharge Placement                       Discharge Plan and Services                DME Arranged: Oxygen DME Agency: [employer] Date DME Agency Contacted: 11/01/20   Representative spoke with at DME Agency: [place] ([place]) Interventions     Readmission Risk Interventions Readmission Risk Prevention Plan 04/02/2020  Post Dischage Appt Complete  Medication Screening Complete  Transportation Screening Complete  Some recent data might be hidden

## 2020-11-01 NOTE — ED Notes (Signed)
Report given to floor - Sammy, RN. 

## 2020-11-01 NOTE — Progress Notes (Signed)
SATURATION QUALIFICATIONS: (This note is used to comply with regulatory documentation for home oxygen)  Patient Saturations on Room Air at Rest = 90%  Patient Saturations on Room Air while Ambulating = 85%  Patient Saturations on 2 Liters of oxygen while Ambulating = 92%  Please briefly explain why patient needs home oxygen: Patient required supplemental oxygen to maintain SpO2 >/88% with activity.  Mabeline Caras, PT, DPT Acute Rehabilitation Services  Pager [phone] Office [phone]

## 2020-11-01 NOTE — Discharge Planning (Signed)
[name] to be D/C'd  per MD order. Discussed with the patient and all questions fully answered.  VSS, Skin clean, dry and intact without evidence of skin break down, no evidence of skin tears noted.  IV catheter discontinued intact. Site without signs and symptoms of complications. Dressing and pressure applied.  An After Visit Summary was printed and given to the patient. Patient received prescription.  D/c education completed with patient/family including follow up instructions, medication list, d/c activities limitations if indicated, with other d/c instructions as indicated by MD - patient able to verbalize understanding, all questions fully answered.   Patient instructed to return to ED, call 911, or call MD for any changes in condition.   Patient to be escorted via [place], and D/C home via private auto.

## 2020-11-01 NOTE — Evaluation (Signed)
Physical Therapy Evaluation Patient Details Name: [name] MRN: [id number] DOB: [birth year] Today's Date: 11/01/2020   History of Present Illness  Pt is an 80 y.o. female admitted 11/01/19 with malaise, loss of appetite, chills for approximately 2 wks. Workup for acute hypoxic respiratory failure. CTA chest negative for acute abnormality. PMH includes HTN, RA, PE, PE, possible ILD, breast CA.    Clinical Impression  Pt presents with an overall decrease in functional mobility secondary to above. PTA, pt independent, active, lives with husband, has supportive daughters nearby. Today, pt moving well with intermittent min guard for balance; DOE 3/4 with bouts of standing activity. Pt required 2L O2 [place] to maintain SpO2 >/88% with activity (see saturations qualifications note). Educ pt and daughter re: O2 needs, energy conservation, activity recommendations, importance of mobility. Pt hopeful for d/c home soon. If to remain admitted, will follow acutely to address established goals.    Follow Up Recommendations No PT follow up;Supervision for mobility/OOB    Equipment Recommendations  None recommended by PT    Recommendations for Other Services       Precautions / Restrictions Precautions Precautions: Fall;Other (comment) Precaution Comments: Watch SpO2 Restrictions Weight Bearing Restrictions: No      Mobility  Bed Mobility               General bed mobility comments: Received sitting EOB    Transfers Overall transfer level: Needs assistance Equipment used: None Transfers: Sit to/from Stand Sit to Stand: Min guard         General transfer comment: Min guard for balance first time up  Ambulation/Gait Ambulation/Gait assistance: Min guard Gait Distance (Feet): 120 Feet Assistive device: None   Gait velocity: Decreased   General Gait Details: Slow, mostly steady gait, ambulating multiple laps throughout room with intermittent min guard for balance; DOE 3/4  with slight wheezing noted, seated rest break to recover SOB and fatigue. SpO2 down to 85% on RA. Additional activity trial with 2L O2 with 2x bouts marching in place for ~60-sec, SpO2 92% on 2L O2  Stairs            Wheelchair Mobility    Modified Rankin (Stroke Patients Only)       Balance Overall balance assessment: Mild deficits observed, not formally tested                                           Pertinent Vitals/Pain Pain Assessment: No/denies pain    Home Living Family/patient expects to be discharged to:: Private residence Living Arrangements: Spouse/significant other Available Help at Discharge: Family;Available 24 hours/day Type of Home: House Home Access: Stairs to enter Entrance Stairs-Rails: None Entrance Stairs-Number of Steps: 2 Home Layout: Two level;Bed/bath upstairs;1/2 bath on main level Home Equipment: Bedside commode Additional Comments: Lives with 82 y.o. husband who is active/drives/good health; two daughters live nearby and very supportive    Prior Function Level of Independence: Independent         Comments: Independent, active, enjoys walking dog; does have h/o wheezing and SOB with activity     Hand Dominance        Extremity/Trunk Assessment   Upper Extremity Assessment Upper Extremity Assessment: Overall WFL for tasks assessed    Lower Extremity Assessment Lower Extremity Assessment: Overall WFL for tasks assessed    Cervical / Trunk Assessment Cervical / Trunk Assessment: Normal  Communication  Communication: No difficulties  Cognition Arousal/Alertness: Awake/alert Behavior During Therapy: WFL for tasks assessed/performed Overall Cognitive Status: Within Functional Limits for tasks assessed                                        General Comments General comments (skin integrity, edema, etc.): Daughter present and supportive. Educ pt and daughter re: energy conservation strategies  (handout provided), O2 needs, activity recommendations (including walking program), importance of mobility    Exercises     Assessment/Plan    PT Assessment Patient needs continued PT services  PT Problem List Decreased activity tolerance;Decreased balance;Decreased mobility;Cardiopulmonary status limiting activity       PT Treatment Interventions DME instruction;Gait training;Stair training;Functional mobility training;Therapeutic activities;Therapeutic exercise;Balance training;Patient/family education    PT Goals (Current goals can be found in the Care Plan section)  Acute Rehab PT Goals Patient Stated Goal: Return home asap PT Goal Formulation: With patient/family Time For Goal Achievement: 11/15/20 Potential to Achieve Goals: Good    Frequency Min 3X/week   Barriers to discharge        Co-evaluation               AM-PAC PT "6 Clicks" Mobility  Outcome Measure Help needed turning from your back to your side while in a flat bed without using bedrails?: None Help needed moving from lying on your back to sitting on the side of a flat bed without using bedrails?: None Help needed moving to and from a bed to a chair (including a wheelchair)?: A Little Help needed standing up from a chair using your arms (e.g., wheelchair or bedside chair)?: A Little Help needed to walk in hospital room?: A Little Help needed climbing 3-5 steps with a railing? : A Little 6 Click Score: 20    End of Session Equipment Utilized During Treatment: Gait belt Activity Tolerance: Patient tolerated treatment well Patient left: in chair;with call bell/phone within reach;with family/visitor present Nurse Communication: Mobility status PT Visit Diagnosis: Other abnormalities of gait and mobility (R26.89)    Time: 7096-2836 PT Time Calculation (min) (ACUTE ONLY): 27 min   Charges:   PT Evaluation $PT Eval Low Complexity: 1 Low PT Treatments $Self Care/Home Management: 8-22   Mabeline Caras, PT, DPT Acute Rehabilitation Services  Pager [phone] Office [place] 11/01/2020, 9:50 AM

## 2020-11-01 NOTE — Discharge Summary (Signed)
Physician Discharge Summary  [name] QAS:[id number] DOB: 1941-05-20 DOA: 10/31/2020  PCP: [name], MD  Admit date: 10/31/2020 Discharge date: 11/01/2020  Admitted From: Home Disposition: Home  Recommendations for Outpatient Follow-up:  1. Follow up with PCP in 1-2 weeks 2. Follow-up with pulmonology as scheduled 3. Discharging on short prednisone taper for RA/ILD flare  Home Health: No Equipment/Devices: Oxygen 2 L per nasal cannula with ambulation  Discharge Condition: Stable CODE STATUS: Full code Diet recommendation: Heart healthy/consistent carbohydrate diet  History of present illness:  [name] is a 80 y.o. female with medical history significant for history of PE on Xarelto, hypertension, breast cancer, rheumatoid arthritis, possible interstitial lung disease, presenting to the emergency department for evaluation of fatigue, malaise, loss of appetite, and chills.  Patient reports approximately 2 weeks of fatigue, general malaise, loss of appetite, chills, and aches.  She does not feel particularly short of breath and denies any cough, chest pain, palpitations, leg swelling, or orthopnea.  She had been walking every day but has not done that recently due to not feeling well and also because of the recent ice and snow.  She denies any abdominal pain, dysuria, or hematuria.  She has a mild discomfort at the right flank that is worse with deep breath.  Denies any fall or trauma.  Upon arrival to the ED, patient is found to be febrile to 38.7 C, saturating low 90s on room air, and with stable blood pressure.  EKG features sinus rhythm with inferior and anterolateral ST-T abnormalities.  Chest x-ray with mild left base atelectasis.  CT abdomen and pelvis notable for cholelithiasis without acute cholecystitis.  CTA chest is negative for PE or other acute cardiopulmonary disease.  Right upper quadrant ultrasound again reveals cholelithiasis without evidence for  acute cholecystitis.  Chemistry panel and CBC are unremarkable.  D-dimer was normal and Covid PCR negative.  High-sensitivity troponin is normal x2.  Patient was treated with Tylenol and orders were placed for blood and urine cultures and influenza testing.  Patient was ambulated in the emergency department and dropped her oxygen saturations to the low 80s.  Hospital course:   Acute hypoxic respiratory failure Interstitial lung disease flare Patient presenting to the ED with 2-week history of progressive fatigue, malaise, chills and shortness of breath.  On arrival patient was noted to be saturating in the low 90s on room air and became more hypoxic with ambulation with SPO2 in the low 80s.  Additionally, patient febrile up to 101.6 F on arrival.  Chest x-ray with mild left basilar atelectasis, otherwise unremarkable.  CT angiogram chest negative for pulmonary embolism or acute cardiopulmonary disease process.  Covid-19 negative.  Urinalysis unrevealing.  Respiratory PCR negative.  Troponin within normal limits.  Patient with history of interstitial lung disease, follows with pulmonology outpatient.  Patient was noted to have an elevated ESR and CRP.  Etiology of her symptoms and shortness of breath likely related to mild ILD flare.  Discussed with patient and daughter at bedside regarding treatment with prednisone taper.  Recommended higher dose, although patient declined and states steroids " make her crazy".  She prefers a shorter lower dose prednisone taper, will start at 20 mg daily over the next several days.  Patient has follow-up scheduled with pulmonology 11/08/2020.  Offered albuterol MDI, patient states has one at home to use as needed.  Patient was seen by physical therapy with recommendation of home oxygen with ambulation.  Rheumatoid arthritis flare Patient follows  with rheumatology outpatient, was on methotrexate, but was directed to discontinue recently due to possible side effect.  States  has previously failed Humira and Enbrel.  ESR 78, CRP 7.9.  We will started on prednisone taper as above.  May benefit from different immunomodulator for optimize control of her arthritis.  History of pulmonary embolism CT angiogram chest negative for PE during admission.  Continue Xarelto.  Essential hypertension BP 118/91, well controlled.  Continue hydrochlorothiazide.  Breast cancer Continue outpatient follow-up with oncology, Dr. Lindi Adie.   Discharge Diagnoses:  Active Problems:   Hypertension   Rheumatoid arthritis flare (HCC)   ILD (interstitial lung disease) ([place])   History of pulmonary embolism   Acute respiratory failure with hypoxia [facility])    Discharge Instructions  Discharge Instructions    Call MD for:  difficulty breathing, headache or visual disturbances   Complete by: As directed    Call MD for:  extreme fatigue   Complete by: As directed    Call MD for:  persistant dizziness or light-headedness   Complete by: As directed    Call MD for:  persistant nausea and vomiting   Complete by: As directed    Call MD for:  severe uncontrolled pain   Complete by: As directed    Call MD for:  temperature >100.4   Complete by: As directed    Diet - low sodium heart healthy   Complete by: As directed    Increase activity slowly   Complete by: As directed      Allergies as of 11/01/2020      Reactions   Codeine Nausea And Vomiting   Hydrocodone-homatropine Other (See Comments)   Hallucinations      Medication List    TAKE these medications   acetaminophen 500 MG tablet Commonly known as: TYLENOL Take 1,000 mg by mouth in the morning and at bedtime.   folic acid 1 MG tablet Commonly known as: FOLVITE Take 2 mg by mouth daily.   hydrochlorothiazide 25 MG tablet Commonly known as: HYDRODIURIL Take 0.5 tablets (12.5 mg total) by mouth every other day.   methotrexate 2.5 MG tablet Take 15 mg by mouth once a week.   predniSONE 5 MG tablet Commonly known as:  DELTASONE Take 4 tablets (20 mg total) by mouth daily with breakfast for 3 days, THEN 2 tablets (10 mg total) daily with breakfast for 3 days, THEN 1 tablet (5 mg total) daily with breakfast for 5 days. Start taking on: November 01, 2020   rivaroxaban 20 MG Tabs tablet Commonly known as: XARELTO Take 1 tablet (20 mg total) by mouth daily with supper.            Durable Medical Equipment  (From admission, onward)         Start     Ordered   11/01/20 0946  For home use only DME oxygen  Once       Question Answer Comment  Length of Need Lifetime   Mode or (Route) Nasal cannula   Liters per Minute 2   Frequency Continuous (stationary and portable oxygen unit needed)   Oxygen conserving device Yes   Oxygen delivery system Gas      11/01/20 0947          Allergies  Allergen Reactions  . Codeine Nausea And Vomiting  . Hydrocodone-Homatropine Other (See Comments)    Hallucinations     Consultations:  None   Procedures/Studies: CT Angio Chest PE W and/or Wo Contrast  Result Date: 10/31/2020 CLINICAL DATA:  Shortness of breath, chest tightness, right rib pain, right flank pain EXAM: CT ANGIOGRAPHY CHEST WITH CONTRAST TECHNIQUE: Multidetector CT imaging of the chest was performed using the standard protocol during bolus administration of intravenous contrast. Multiplanar CT image reconstructions and MIPs were obtained to evaluate the vascular anatomy. CONTRAST:  156m OMNIPAQUE IOHEXOL 350 MG/ML SOLN COMPARISON:  04/01/2020 FINDINGS: Cardiovascular: No filling defects in the pulmonary arteries to suggest pulmonary emboli. Mild cardiomegaly. Coronary artery and aortic calcifications. No aneurysm. Mediastinum/Nodes: Small scattered mediastinal lymph nodes, none pathologically enlarged. No mediastinal, hilar, or axillary adenopathy. Trachea and esophagus are unremarkable. Thyroid unremarkable. Lungs/Pleura: Biapical and bibasilar scarring. No confluent opacities or effusions. Upper  Abdomen: Imaging into the upper abdomen demonstrates no acute findings. Musculoskeletal: Chest wall soft tissues are unremarkable. No acute bony abnormality. Review of the MIP images confirms the above findings. IMPRESSION: No evidence of pulmonary embolus. No acute cardiopulmonary disease. Coronary artery disease. Aortic Atherosclerosis (ICD10-I70.0). Electronically Signed   By: KRolm BaptiseM.D.   On: 10/31/2020 19:58   CT ABDOMEN PELVIS W CONTRAST  Result Date: 10/31/2020 CLINICAL DATA:  Right rib, flank pain, fever EXAM: CT ABDOMEN AND PELVIS WITH CONTRAST TECHNIQUE: Multidetector CT imaging of the abdomen and pelvis was performed using the standard protocol following bolus administration of intravenous contrast. CONTRAST:  1068mOMNIPAQUE IOHEXOL 350 MG/ML SOLN COMPARISON:  None. FINDINGS: Lower chest: Bibasilar scarring.  No acute abnormality. Hepatobiliary: Diffuse low-density throughout the liver compatible with fatty infiltration. 2.2 cm gallstone within the gallbladder. No biliary ductal dilatation. Pancreas: No focal abnormality or ductal dilatation. Spleen: No focal abnormality.  Normal size. Adrenals/Urinary Tract: No adrenal abnormality. No focal renal abnormality. No stones or hydronephrosis. Urinary bladder is unremarkable. Stomach/Bowel: Normal appendix. Stomach, large and small bowel grossly unremarkable. Vascular/Lymphatic: Aortic atherosclerosis. No evidence of aneurysm or adenopathy. Reproductive: Uterus and adnexa unremarkable.  No mass. Other: No free fluid or free air. Musculoskeletal: No acute bony abnormality. IMPRESSION: Cholelithiasis.  No CT evidence of acute cholecystitis. Hepatic steatosis. Aortic atherosclerosis. Electronically Signed   By: KeRolm Baptise.D.   On: 10/31/2020 19:53   DG Chest Portable 1 View  Result Date: 10/31/2020 CLINICAL DATA:  Cough and fever with chest pain EXAM: PORTABLE CHEST 1 VIEW COMPARISON:  April 01, 2020 chest radiograph and CT angiogram chest  FINDINGS: There is slight atelectasis in the left base. Lungs elsewhere are clear. Heart size and pulmonary vascularity are normal. No adenopathy. There is aortic atherosclerosis. Surgical clips noted in each breast region. Stable chronic erosion in each lateral clavicle. IMPRESSION: Mild left base atelectasis. Lungs elsewhere clear. Stable cardiac silhouette. Aortic Atherosclerosis (ICD10-I70.0). Electronically Signed   By: WiLowella GripII M.D.   On: 10/31/2020 13:20   USKoreabdomen Limited RUQ (LIVER/GB)  Result Date: 10/31/2020 CLINICAL DATA:  Right upper quadrant pain EXAM: ULTRASOUND ABDOMEN LIMITED RIGHT UPPER QUADRANT COMPARISON:  CT 10/31/2020 FINDINGS: Gallbladder: Shadowing stone measuring up to 3 cm. Normal wall thickness. Negative sonographic Murphy Common bile duct: Diameter: 3 mm Liver: Diffusely echogenic liver. No focal hepatic abnormality portal vein is patent on color Doppler imaging with normal direction of blood flow towards the liver. Other: None. IMPRESSION: 1. Cholelithiasis without sonographic evidence for acute cholecystitis. 2. Diffusely echogenic liver consistent with steatosis. Electronically Signed   By: KiDonavan Foil.D.   On: 10/31/2020 20:56      Subjective: Patient seen and examined bedside, resting comfortably in bedside chair.  Oxygen off at rest.  Daughter present at bedside.  Patient states ready to go home.  No further fevers overnight.  Was seen by physical therapy this morning with recommendation of home oxygen with exertion.  Patient has upcoming appointment with pulmonology.  Long discussion with patient daughter at bedside regarding symptoms and findings of extensive work-up, no infectious etiology and believe her underlying symptoms are related to a flare of her underlying rheumatoid arthritis and interstitial lung disease given elevated inflammatory markers.  No other questions or concerns at this time.  Denies headache, no visual changes, no chest pain, no  palpitations, no cough/congestion, no current nausea/vomiting/diarrhea, no current fever/chills/night sweats, no abdominal pain.  No acute events overnight per nursing staff.  Discharge Exam: Vitals:   11/01/20 0722 11/01/20 0949  BP: 132/72 (!) 141/67  Pulse: 78 88  Resp: 18 16  Temp: 99.7 F (37.6 C) 99.2 F (37.3 C)  SpO2: 97% 93%   Vitals:   11/01/20 0430 11/01/20 0621 11/01/20 0722 11/01/20 0949  BP: (!) 143/71 (!) 118/99 132/72 (!) 141/67  Pulse: 81 85 78 88  Resp: (!) 29 (!) '23 18 16  ' Temp:  98.9 F (37.2 C) 99.7 F (37.6 C) 99.2 F (37.3 C)  TempSrc:  Oral Oral Oral  SpO2: 93% 94% 97% 93%    General: Pt is alert, awake, not in acute distress Cardiovascular: RRR, S1/S2 +, no rubs, no gallops Respiratory: CTA bilaterally, no wheezing, no rhonchi Abdominal: Soft, NT, ND, bowel sounds + Extremities: no edema, no cyanosis    The results of significant diagnostics from this hospitalization (including imaging, microbiology, ancillary and laboratory) are listed below for reference.     Microbiology: Recent Results (from the past 240 hour(s))  SARS Coronavirus 2 by RT PCR (hospital order, performed in [facility] hospital lab) Nasopharyngeal Nasopharyngeal Swab     Status: None   Collection Time: 10/31/20 12:54 PM   Specimen: Nasopharyngeal Swab  Result Value Ref Range Status   SARS Coronavirus 2 NEGATIVE NEGATIVE Final    Comment: (NOTE) SARS-CoV-2 target nucleic acids are NOT DETECTED.  The SARS-CoV-2 RNA is generally detectable in upper and lower respiratory specimens during the acute phase of infection. The lowest concentration of SARS-CoV-2 viral copies this assay can detect is 250 copies / mL. A negative result does not preclude SARS-CoV-2 infection and should not be used as the sole basis for treatment or other patient management decisions.  A negative result may occur with improper specimen collection / handling, submission of specimen other than  nasopharyngeal swab, presence of viral mutation(s) within the areas targeted by this assay, and inadequate number of viral copies (<250 copies / mL). A negative result must be combined with clinical observations, patient history, and epidemiological information.  Fact Sheet for Patients:   StrictlyIdeas.no  Fact Sheet for Healthcare Providers: BankingDealers.co.za  This test is not yet approved or  cleared by the [place] FDA and has been authorized for detection and/or diagnosis of SARS-CoV-2 by FDA under an Emergency Use Authorization (EUA).  This EUA will remain in effect (meaning this test can be used) for the duration of the COVID-19 declaration under Section 564(b)(1) of the Act, 21 U.S.C. section 360bbb-3(b)(1), unless the authorization is terminated or revoked sooner.  Performed at [facility], [place] [address], [place], [place] [address]   Resp panel by RT-PCR (RSV, Flu A&B, Covid)     Status: None   Collection Time: 11/01/20  6:20 AM  Result Value Ref Range Status  SARS Coronavirus 2 by RT PCR NEGATIVE NEGATIVE Final    Comment: (NOTE) SARS-CoV-2 target nucleic acids are NOT DETECTED.  The SARS-CoV-2 RNA is generally detectable in upper respiratory specimens during the acute phase of infection. The lowest concentration of SARS-CoV-2 viral copies this assay can detect is 138 copies/mL. A negative result does not preclude SARS-Cov-2 infection and should not be used as the sole basis for treatment or other patient management decisions. A negative result may occur with  improper specimen collection/handling, submission of specimen other than nasopharyngeal swab, presence of viral mutation(s) within the areas targeted by this assay, and inadequate number of viral copies(<138 copies/mL). A negative result must be combined with clinical observations, patient history, and epidemiological information. The expected  result is Negative.  Fact Sheet for Patients:  EntrepreneurPulse.com.au  Fact Sheet for Healthcare Providers:  IncredibleEmployment.be  This test is no t yet approved or cleared by the [place] FDA and  has been authorized for detection and/or diagnosis of SARS-CoV-2 by FDA under an Emergency Use Authorization (EUA). This EUA will remain  in effect (meaning this test can be used) for the duration of the COVID-19 declaration under Section 564(b)(1) of the Act, 21 U.S.C.section 360bbb-3(b)(1), unless the authorization is terminated  or revoked sooner.       Influenza A by PCR NEGATIVE NEGATIVE Final   Influenza B by PCR NEGATIVE NEGATIVE Final    Comment: (NOTE) The Xpert Xpress SARS-CoV-2/FLU/RSV plus assay is intended as an aid in the diagnosis of influenza from Nasopharyngeal swab specimens and should not be used as a sole basis for treatment. Nasal washings and aspirates are unacceptable for Xpert Xpress SARS-CoV-2/FLU/RSV testing.  Fact Sheet for Patients: EntrepreneurPulse.com.au  Fact Sheet for Healthcare Providers: IncredibleEmployment.be  This test is not yet approved or cleared by the [place] FDA and has been authorized for detection and/or diagnosis of SARS-CoV-2 by FDA under an Emergency Use Authorization (EUA). This EUA will remain in effect (meaning this test can be used) for the duration of the COVID-19 declaration under Section 564(b)(1) of the Act, 21 U.S.C. section 360bbb-3(b)(1), unless the authorization is terminated or revoked.     Resp Syncytial Virus by PCR NEGATIVE NEGATIVE Final    Comment: (NOTE) Fact Sheet for Patients: EntrepreneurPulse.com.au  Fact Sheet for Healthcare Providers: IncredibleEmployment.be  This test is not yet approved or cleared by the [place] FDA and has been authorized for detection and/or diagnosis of  SARS-CoV-2 by FDA under an Emergency Use Authorization (EUA). This EUA will remain in effect (meaning this test can be used) for the duration of the COVID-19 declaration under Section 564(b)(1) of the Act, 21 U.S.C. section 360bbb-3(b)(1), unless the authorization is terminated or revoked.  Performed at [facility], [place] [address], [place], [place] [address]      Labs: BNP (last 3 results) Recent Labs    04/01/20 1204  BNP 30.8   Basic Metabolic Panel: Recent Labs  Lab 10/31/20 1303 11/01/20 0054 11/01/20 0346  NA 136 138 135  K 3.9 3.3* 3.4*  CL 101  --  99  CO2 22  --  22  GLUCOSE 106*  --  92  BUN 9  --  9  CREATININE 0.73  --  0.70  CALCIUM 8.9  --  8.6*   Liver Function Tests: Recent Labs  Lab 10/31/20 1630  AST 32  ALT 30  ALKPHOS 66  BILITOT 0.9  PROT 7.1  ALBUMIN 3.4*   Recent Labs  Lab 10/31/20 1630  LIPASE 25   No results for input(s): AMMONIA in the last 168 hours. CBC: Recent Labs  Lab 10/31/20 1303 11/01/20 0054 11/01/20 0346  WBC 8.6  --  7.8  NEUTROABS  --   --  4.1  HGB 12.2 10.9* 10.9*  HCT 36.7 32.0* 34.0*  MCV 98.1  --  98.3  PLT 276  --  240   Cardiac Enzymes: No results for input(s): CKTOTAL, CKMB, CKMBINDEX, TROPONINI in the last 168 hours. BNP: Invalid input(s): POCBNP CBG: No results for input(s): GLUCAP in the last 168 hours. D-Dimer Recent Labs    10/31/20 1303  DDIMER 0.44   Hgb A1c No results for input(s): HGBA1C in the last 72 hours. Lipid Profile No results for input(s): CHOL, HDL, LDLCALC, TRIG, CHOLHDL, LDLDIRECT in the last 72 hours. Thyroid function studies No results for input(s): TSH, T4TOTAL, T3FREE, THYROIDAB in the last 72 hours.  Invalid input(s): FREET3 Anemia work up No results for input(s): VITAMINB12, FOLATE, FERRITIN, TIBC, IRON, RETICCTPCT in the last 72 hours. Urinalysis    Component Value Date/Time   COLORURINE YELLOW 10/31/2020 1426   APPEARANCEUR CLEAR 10/31/2020 1426    LABSPEC 1.016 10/31/2020 1426   PHURINE 5.0 10/31/2020 1426   GLUCOSEU NEGATIVE 10/31/2020 1426   HGBUR SMALL (A) 10/31/2020 1426   BILIRUBINUR NEGATIVE 10/31/2020 1426   BILIRUBINUR Negative 02/06/2020 1306   KETONESUR NEGATIVE 10/31/2020 1426   PROTEINUR 100 (A) 10/31/2020 1426   UROBILINOGEN 0.2 02/06/2020 1306   UROBILINOGEN 0.2 10/03/2012 1408   NITRITE NEGATIVE 10/31/2020 1426   LEUKOCYTESUR NEGATIVE 10/31/2020 1426   Sepsis Labs Invalid input(s): PROCALCITONIN,  WBC,  LACTICIDVEN Microbiology Recent Results (from the past 240 hour(s))  SARS Coronavirus 2 by RT PCR (hospital order, performed in [place] hospital lab) Nasopharyngeal Nasopharyngeal Swab     Status: None   Collection Time: 10/31/20 12:54 PM   Specimen: Nasopharyngeal Swab  Result Value Ref Range Status   SARS Coronavirus 2 NEGATIVE NEGATIVE Final    Comment: (NOTE) SARS-CoV-2 target nucleic acids are NOT DETECTED.  The SARS-CoV-2 RNA is generally detectable in upper and lower respiratory specimens during the acute phase of infection. The lowest concentration of SARS-CoV-2 viral copies this assay can detect is 250 copies / mL. A negative result does not preclude SARS-CoV-2 infection and should not be used as the sole basis for treatment or other patient management decisions.  A negative result may occur with improper specimen collection / handling, submission of specimen other than nasopharyngeal swab, presence of viral mutation(s) within the areas targeted by this assay, and inadequate number of viral copies (<250 copies / mL). A negative result must be combined with clinical observations, patient history, and epidemiological information.  Fact Sheet for Patients:   StrictlyIdeas.no  Fact Sheet for Healthcare Providers: BankingDealers.co.za  This test is not yet approved or  cleared by the [place] FDA and has been authorized for detection and/or  diagnosis of SARS-CoV-2 by FDA under an Emergency Use Authorization (EUA).  This EUA will remain in effect (meaning this test can be used) for the duration of the COVID-19 declaration under Section 564(b)(1) of the Act, 21 U.S.C. section 360bbb-3(b)(1), unless the authorization is terminated or revoked sooner.  Performed at [facility], [place] [address], [place], [place] [address]   Resp panel by RT-PCR (RSV, Flu A&B, Covid)     Status: None   Collection Time: 11/01/20  6:20 AM  Result Value Ref Range Status  SARS Coronavirus 2 by RT PCR NEGATIVE NEGATIVE Final    Comment: (NOTE) SARS-CoV-2 target nucleic acids are NOT DETECTED.  The SARS-CoV-2 RNA is generally detectable in upper respiratory specimens during the acute phase of infection. The lowest concentration of SARS-CoV-2 viral copies this assay can detect is 138 copies/mL. A negative result does not preclude SARS-Cov-2 infection and should not be used as the sole basis for treatment or other patient management decisions. A negative result may occur with  improper specimen collection/handling, submission of specimen other than nasopharyngeal swab, presence of viral mutation(s) within the areas targeted by this assay, and inadequate number of viral copies(<138 copies/mL). A negative result must be combined with clinical observations, patient history, and epidemiological information. The expected result is Negative.  Fact Sheet for Patients:  EntrepreneurPulse.com.au  Fact Sheet for Healthcare Providers:  IncredibleEmployment.be  This test is no t yet approved or cleared by the [place] FDA and  has been authorized for detection and/or diagnosis of SARS-CoV-2 by FDA under an Emergency Use Authorization (EUA). This EUA will remain  in effect (meaning this test can be used) for the duration of the COVID-19 declaration under Section 564(b)(1) of the Act, 21 U.S.C.section  360bbb-3(b)(1), unless the authorization is terminated  or revoked sooner.       Influenza A by PCR NEGATIVE NEGATIVE Final   Influenza B by PCR NEGATIVE NEGATIVE Final    Comment: (NOTE) The Xpert Xpress SARS-CoV-2/FLU/RSV plus assay is intended as an aid in the diagnosis of influenza from Nasopharyngeal swab specimens and should not be used as a sole basis for treatment. Nasal washings and aspirates are unacceptable for Xpert Xpress SARS-CoV-2/FLU/RSV testing.  Fact Sheet for Patients: EntrepreneurPulse.com.au  Fact Sheet for Healthcare Providers: IncredibleEmployment.be  This test is not yet approved or cleared by the [place] FDA and has been authorized for detection and/or diagnosis of SARS-CoV-2 by FDA under an Emergency Use Authorization (EUA). This EUA will remain in effect (meaning this test can be used) for the duration of the COVID-19 declaration under Section 564(b)(1) of the Act, 21 U.S.C. section 360bbb-3(b)(1), unless the authorization is terminated or revoked.     Resp Syncytial Virus by PCR NEGATIVE NEGATIVE Final    Comment: (NOTE) Fact Sheet for Patients: EntrepreneurPulse.com.au  Fact Sheet for Healthcare Providers: IncredibleEmployment.be  This test is not yet approved or cleared by the [place] FDA and has been authorized for detection and/or diagnosis of SARS-CoV-2 by FDA under an Emergency Use Authorization (EUA). This EUA will remain in effect (meaning this test can be used) for the duration of the COVID-19 declaration under Section 564(b)(1) of the Act, 21 U.S.C. section 360bbb-3(b)(1), unless the authorization is terminated or revoked.  Performed at [facility], [place] [address], [place], [place] [address]      Time coordinating discharge: Over 30 minutes  SIGNED:   Xachary Hambly J [place] DO  [facility] 11/01/2020, 10:05 AM

## 2020-11-04 ENCOUNTER — Telehealth: Payer: Self-pay | Admitting: *Deleted

## 2020-11-04 MED ORDER — BENZONATATE 200 MG PO CAPS: 200.0000 mg | ORAL_CAPSULE | Freq: Two times a day (BID) | ORAL | 0 refills | Status: DC | PRN

## 2020-11-04 NOTE — Telephone Encounter (Signed)
Mrs. Jagodzinski notified as instructed by telephone.  Appointment scheduled for 11/07/2020 at 12:00 pm for hospital follow up with Dr. Diona Browner.

## 2020-11-04 NOTE — Telephone Encounter (Signed)
Patient called stating that she is home from the hospital now. Patient stated that she is having a dry cough that is very annoying. Patient denies fever, body aches, congestion or any other symptoms. Patient stated that she has the cough especially in the mornings. Patient stated that Dr, Diona Browner gave her some pills for a  cough about a year ago that worked great.. Patient would like a script sent to the pharmacy for the pills. [place]

## 2020-11-04 NOTE — Telephone Encounter (Signed)
I have called in benzonatate for her to use for the cough.  Please make sure she has hospital follow up arranged.

## 2020-11-06 ENCOUNTER — Other Ambulatory Visit: Payer: Self-pay

## 2020-11-06 LAB — CULTURE, BLOOD (ROUTINE X 2)
Culture: NO GROWTH
Culture: NO GROWTH
Special Requests: ADEQUATE

## 2020-11-07 ENCOUNTER — Encounter: Payer: Self-pay | Admitting: Family Medicine

## 2020-11-07 ENCOUNTER — Ambulatory Visit: Payer: Medicare Other | Admitting: Family Medicine

## 2020-11-07 VITALS — BP 130/60 | HR 94 | Temp 98.0°F | Ht 64.0 in | Wt 206.2 lb

## 2020-11-07 DIAGNOSIS — J9601 Acute respiratory failure with hypoxia: Secondary | ICD-10-CM

## 2020-11-07 DIAGNOSIS — J849 Interstitial pulmonary disease, unspecified: Secondary | ICD-10-CM | POA: Diagnosis not present

## 2020-11-07 DIAGNOSIS — E876 Hypokalemia: Secondary | ICD-10-CM | POA: Diagnosis not present

## 2020-11-07 DIAGNOSIS — D649 Anemia, unspecified: Secondary | ICD-10-CM

## 2020-11-07 LAB — COMPREHENSIVE METABOLIC PANEL
ALT: 28 U/L (ref 0–35)
AST: 22 U/L (ref 0–37)
Albumin: 4 g/dL (ref 3.5–5.2)
Alkaline Phosphatase: 93 U/L (ref 39–117)
BUN: 15 mg/dL (ref 6–23)
CO2: 30 mEq/L (ref 19–32)
Calcium: 9.6 mg/dL (ref 8.4–10.5)
Chloride: 100 mEq/L (ref 96–112)
Creatinine, Ser: 0.78 mg/dL (ref 0.40–1.20)
GFR: 71.14 mL/min (ref >60.00)
Glucose, Bld: 120 mg/dL — ABNORMAL HIGH (ref 70–99)
Potassium: 4.8 mEq/L (ref 3.5–5.1)
Sodium: 136 mEq/L (ref 135–145)
Total Bilirubin: 0.4 mg/dL (ref 0.2–1.2)
Total Protein: 8 g/dL (ref 6.0–8.3)

## 2020-11-07 LAB — CBC WITH DIFFERENTIAL/PLATELET
Basophils Absolute: 0 10*3/uL (ref 0.0–0.1)
Basophils Relative: 0.4 % (ref 0.0–3.0)
Eosinophils Absolute: 0.1 10*3/uL (ref 0.0–0.7)
Eosinophils Relative: 0.6 % (ref 0.0–5.0)
HCT: 38.4 % (ref 36.0–46.0)
Hemoglobin: 12.8 g/dL (ref 12.0–15.0)
Lymphocytes Relative: 31.9 % (ref 12.0–46.0)
Lymphs Abs: 3.7 10*3/uL (ref 0.7–4.0)
MCHC: 33.3 g/dL (ref 30.0–36.0)
MCV: 95.3 fl (ref 78.0–100.0)
Monocytes Absolute: 1.1 10*3/uL — ABNORMAL HIGH (ref 0.1–1.0)
Monocytes Relative: 9.9 % (ref 3.0–12.0)
Neutro Abs: 6.6 10*3/uL (ref 1.4–7.7)
Neutrophils Relative %: 57.2 % (ref 43.0–77.0)
Platelets: 355 10*3/uL (ref 150.0–400.0)
RBC: 4.03 Mil/uL (ref 3.87–5.11)
RDW: 16.5 % — ABNORMAL HIGH (ref 11.5–15.5)
WBC: 11.6 10*3/uL — ABNORMAL HIGH (ref 4.0–10.5)

## 2020-11-07 LAB — IBC + FERRITIN
Ferritin: 312.8 ng/mL — ABNORMAL HIGH (ref 10.0–291.0)
Iron: 61 ug/dL (ref 42–145)
Saturation Ratios: 18.5 % — ABNORMAL LOW (ref 20.0–50.0)
Transferrin: 235 mg/dL (ref 212.0–360.0)

## 2020-11-07 NOTE — Progress Notes (Signed)
Patient ID: [name], female    DOB: 20-Jul-1941, 80 y.o.   MRN: [id number]  This visit was conducted in person.  BP 130/60   Pulse 94   Temp 98 F (36.7 C) (Temporal)   Ht '5\' 4"'  (1.626 m)   Wt 206 lb 4 oz (93.6 kg)   SpO2 91%   BMI 35.40 kg/m    CC:  Chief Complaint  Patient presents with  . Hospitalization Follow-up    Hypoxia/RUQ Pain    Subjective:   HPI: [name] is a 80 y.o. female presenting on 11/07/2020 for Hospitalization Follow-up (Hypoxia/RUQ Pain)  Admit date: 10/31/2020 Discharge date: 11/01/2020  HPI: history significant forhistory of PE on Xarelto, hypertension, breast cancer, rheumatoid arthritis, possible interstitial lung disease, presenting to the emergency department for evaluation of fatigue, malaise, loss of appetite, and chills. Patient reports approximately 2 weeks of fatigue, general malaise, loss of appetite, chills, and aches.   Hospital course copied below for informational purposes: Acute hypoxic respiratory failure Interstitial lung disease flare Patient presenting to the ED with 2-week history of progressive fatigue, malaise, chills and shortness of breath.  On arrival patient was noted to be saturating in the low 90s on room air and became more hypoxic with ambulation with SPO2 in the low 80s.  Additionally, patient febrile up to 101.6 F on arrival.  Chest x-ray with mild left basilar atelectasis, otherwise unremarkable.  CT angiogram chest negative for pulmonary embolism or acute cardiopulmonary disease process.  Covid-19 negative.  Urinalysis unrevealing.  Respiratory PCR negative.  Troponin within normal limits.  Patient with history of interstitial lung disease, follows with pulmonology outpatient.  Patient was noted to have an elevated ESR and CRP.  Etiology of her symptoms and shortness of breath likely related to mild ILD flare.  Discussed with patient and daughter at bedside regarding treatment with prednisone taper.   Recommended higher dose, although patient declined and states steroids " make her crazy".  She prefers a shorter lower dose prednisone taper, will start at 20 mg daily over the next several days.  Patient has follow-up scheduled with pulmonology 11/08/2020.  Offered albuterol MDI, patient states has one at home to use as needed.  Patient was seen by physical therapy with recommendation of home oxygen with ambulation.  Rheumatoid arthritis flare Patient follows with rheumatology outpatient, was on methotrexate, but was directed to discontinue recently due to possible side effect.  States has previously failed Humira and Enbrel.  ESR 78, CRP 7.9.  We will started on prednisone taper as above.  May benefit from different immunomodulator for optimize control of her arthritis.  History of pulmonary embolism CT angiogram chest negative for PE during admission.  Continue Xarelto.  11/07/2020 Post Hopsital Update   Since hospitalization she reports she continue to be short of breath with walking.  She has O2 sats usually 92-97%  Occ 87%. She is not using oxygen at home.  She continues to have a cough, no wheeze. She is currently taking prednisone.  no fever.  RA flare  has improved on prednisone.   She has appt for pulmonary visit tommorow.  Relevant past medical, surgical, family and social history reviewed and updated as indicated. Interim medical history since our last visit reviewed. Allergies and medications reviewed and updated. Outpatient Medications Prior to Visit  Medication Sig Dispense Refill  . acetaminophen (TYLENOL) 500 MG tablet Take 1,000 mg by mouth in the morning and at bedtime.    Marland Kitchen  benzonatate (TESSALON) 200 MG capsule Take 1 capsule (200 mg total) by mouth 2 (two) times daily as needed for cough. 20 capsule 0  . folic acid (FOLVITE) 1 MG tablet Take 2 mg by mouth daily.    . hydrochlorothiazide (HYDRODIURIL) 25 MG tablet Take 0.5 tablets (12.5 mg total) by mouth every other day.  45 tablet 1  . methotrexate 2.5 MG tablet Take 15 mg by mouth once a week.    . predniSONE (DELTASONE) 5 MG tablet Take 4 tablets (20 mg total) by mouth daily with breakfast for 3 days, THEN 2 tablets (10 mg total) daily with breakfast for 3 days, THEN 1 tablet (5 mg total) daily with breakfast for 5 days. 23 tablet 0  . rivaroxaban (XARELTO) 20 MG TABS tablet Take 1 tablet (20 mg total) by mouth daily with supper. 90 tablet 3   No facility-administered medications prior to visit.     Per HPI unless specifically indicated in ROS section below Review of Systems  Constitutional: Positive for fatigue. Negative for fever.  HENT: Negative for congestion.   Eyes: Negative for pain.  Respiratory: Positive for shortness of breath. Negative for cough.   Cardiovascular: Negative for chest pain, palpitations and leg swelling.  Gastrointestinal: Negative for abdominal pain.  Genitourinary: Negative for dysuria and vaginal bleeding.  Musculoskeletal: Negative for back pain.  Neurological: Negative for syncope, light-headedness and headaches.  Psychiatric/Behavioral: Negative for dysphoric mood.   Objective:  BP 130/60   Pulse 94   Temp 98 F (36.7 C) (Temporal)   Ht '5\' 4"'  (1.626 m)   Wt 206 lb 4 oz (93.6 kg)   SpO2 91%   BMI 35.40 kg/m   Wt Readings from Last 3 Encounters:  11/07/20 206 lb 4 oz (93.6 kg)  11/01/20 213 lb (96.6 kg)  07/18/20 213 lb 4.8 oz (96.8 kg)      Physical Exam Constitutional:      General: She is not in acute distress.    Appearance: Normal appearance. She is well-developed. She is not ill-appearing or toxic-appearing.  HENT:     Head: Normocephalic.     Right Ear: Hearing, tympanic membrane, ear canal and external ear normal. Tympanic membrane is not erythematous, retracted or bulging.     Left Ear: Hearing, tympanic membrane, ear canal and external ear normal. Tympanic membrane is not erythematous, retracted or bulging.     Nose: No mucosal edema or  rhinorrhea.     Right Sinus: No maxillary sinus tenderness or frontal sinus tenderness.     Left Sinus: No maxillary sinus tenderness or frontal sinus tenderness.     Mouth/Throat:     Pharynx: Uvula midline.  Eyes:     General: Lids are normal. Lids are everted, no foreign bodies appreciated.     Conjunctiva/sclera: Conjunctivae normal.     Pupils: Pupils are equal, round, and reactive to light.  Neck:     Thyroid: No thyroid mass or thyromegaly.     Vascular: No carotid bruit.     Trachea: Trachea normal.  Cardiovascular:     Rate and Rhythm: Normal rate and regular rhythm.     Pulses: Normal pulses.     Heart sounds: Normal heart sounds, S1 normal and S2 normal. No murmur heard. No friction rub. No gallop.   Pulmonary:     Effort: Pulmonary effort is normal. No tachypnea or respiratory distress.     Breath sounds: Normal breath sounds. No decreased breath sounds, wheezing, rhonchi or rales.  Abdominal:     General: Bowel sounds are normal.     Palpations: Abdomen is soft.     Tenderness: There is no abdominal tenderness.  Musculoskeletal:     Cervical back: Normal range of motion and neck supple.  Skin:    General: Skin is warm and dry.     Findings: No rash.  Neurological:     Mental Status: She is alert.  Psychiatric:        Mood and Affect: Mood is not anxious or depressed.        Speech: Speech normal.        Behavior: Behavior normal. Behavior is cooperative.        Thought Content: Thought content normal.        Judgment: Judgment normal.       Results for orders placed or performed during the hospital encounter of 10/31/20  SARS Coronavirus 2 by RT PCR (hospital order, performed in [facility] hospital lab) Nasopharyngeal Nasopharyngeal Swab   Specimen: Nasopharyngeal Swab  Result Value Ref Range   SARS Coronavirus 2 NEGATIVE NEGATIVE  Blood culture (routine x 2)   Specimen: BLOOD LEFT ARM  Result Value Ref Range   Specimen Description BLOOD LEFT ARM     Special Requests      BOTTLES DRAWN AEROBIC AND ANAEROBIC Blood Culture adequate volume   Culture      NO GROWTH 5 DAYS Performed at [facility], [address], [place], [place] [address]    Report Status 11/06/2020 FINAL   Blood culture (routine x 2)   Specimen: BLOOD LEFT HAND  Result Value Ref Range   Specimen Description BLOOD LEFT HAND    Special Requests      BOTTLES DRAWN AEROBIC AND ANAEROBIC Blood Culture results may not be optimal due to an excessive volume of blood received in culture bottles   Culture      NO GROWTH 5 DAYS Performed at [place] [address] [place], [place] [address]    Report Status 11/06/2020 FINAL   Resp panel by RT-PCR (RSV, Flu A&B, Covid)  Result Value Ref Range   SARS Coronavirus 2 by RT PCR NEGATIVE NEGATIVE   Influenza A by PCR NEGATIVE NEGATIVE   Influenza B by PCR NEGATIVE NEGATIVE   Resp Syncytial Virus by PCR NEGATIVE NEGATIVE  Basic metabolic panel  Result Value Ref Range   Sodium 136 135 - 145 mmol/L   Potassium 3.9 3.5 - 5.1 mmol/L   Chloride 101 98 - 111 mmol/L   CO2 22 22 - 32 mmol/L   Glucose, Bld 106 (H) 70 - 99 mg/dL   BUN 9 8 - 23 mg/dL   Creatinine, Ser 0.73 0.44 - 1.00 mg/dL   Calcium 8.9 8.9 - 10.3 mg/dL   GFR, Estimated >60 >60 mL/min   Anion gap 13 5 - 15  CBC  Result Value Ref Range   WBC 8.6 4.0 - 10.5 K/uL   RBC 3.74 (L) 3.87 - 5.11 MIL/uL   Hemoglobin 12.2 12.0 - 15.0 g/dL   HCT 36.7 36.0 - 46.0 %   MCV 98.1 80.0 - 100.0 fL   MCH 32.6 26.0 - 34.0 pg   MCHC 33.2 30.0 - 36.0 g/dL   RDW 16.1 (H) 11.5 - 15.5 %   Platelets 276 150 - 400 K/uL   nRBC 0.0 0.0 - 0.2 %  D-dimer, quantitative (not at [facility])  Result Value Ref Range   D-Dimer, Quant 0.44  0.00 - 0.50 ug/mL-FEU  Urinalysis, Routine w reflex microscopic Urine, Clean Catch  Result Value Ref Range   Color, Urine YELLOW YELLOW   APPearance CLEAR CLEAR   Specific Gravity, Urine 1.016 1.005 - 1.030   pH 5.0 5.0 - 8.0   Glucose, UA  NEGATIVE NEGATIVE mg/dL   Hgb urine dipstick SMALL (A) NEGATIVE   Bilirubin Urine NEGATIVE NEGATIVE   Ketones, ur NEGATIVE NEGATIVE mg/dL   Protein, ur 100 (A) NEGATIVE mg/dL   Nitrite NEGATIVE NEGATIVE   Leukocytes,Ua NEGATIVE NEGATIVE   RBC / HPF 0-5 0 - 5 RBC/hpf   WBC, UA 0-5 0 - 5 WBC/hpf   Bacteria, UA RARE (A) NONE SEEN   Squamous Epithelial / LPF 0-5 0 - 5   Mucus PRESENT   Hepatic function panel  Result Value Ref Range   Total Protein 7.1 6.5 - 8.1 g/dL   Albumin 3.4 (L) 3.5 - 5.0 g/dL   AST 32 15 - 41 U/L   ALT 30 0 - 44 U/L   Alkaline Phosphatase 66 38 - 126 U/L   Total Bilirubin 0.9 0.3 - 1.2 mg/dL   Bilirubin, Direct 0.1 0.0 - 0.2 mg/dL   Indirect Bilirubin 0.8 0.3 - 0.9 mg/dL  Lipase, blood  Result Value Ref Range   Lipase 25 11 - 51 U/L  Basic metabolic panel  Result Value Ref Range   Sodium 135 135 - 145 mmol/L   Potassium 3.4 (L) 3.5 - 5.1 mmol/L   Chloride 99 98 - 111 mmol/L   CO2 22 22 - 32 mmol/L   Glucose, Bld 92 70 - 99 mg/dL   BUN 9 8 - 23 mg/dL   Creatinine, Ser 0.70 0.44 - 1.00 mg/dL   Calcium 8.6 (L) 8.9 - 10.3 mg/dL   GFR, Estimated >60 >60 mL/min   Anion gap 14 5 - 15  CBC WITH DIFFERENTIAL  Result Value Ref Range   WBC 7.8 4.0 - 10.5 K/uL   RBC 3.46 (L) 3.87 - 5.11 MIL/uL   Hemoglobin 10.9 (L) 12.0 - 15.0 g/dL   HCT 34.0 (L) 36.0 - 46.0 %   MCV 98.3 80.0 - 100.0 fL   MCH 31.5 26.0 - 34.0 pg   MCHC 32.1 30.0 - 36.0 g/dL   RDW 16.0 (H) 11.5 - 15.5 %   Platelets 240 150 - 400 K/uL   nRBC 0.0 0.0 - 0.2 %   Neutrophils Relative % 53 %   Neutro Abs 4.1 1.7 - 7.7 K/uL   Lymphocytes Relative 34 %   Lymphs Abs 2.6 0.7 - 4.0 K/uL   Monocytes Relative 11 %   Monocytes Absolute 0.9 0.1 - 1.0 K/uL   Eosinophils Relative 2 %   Eosinophils Absolute 0.2 0.0 - 0.5 K/uL   Basophils Relative 0 %   Basophils Absolute 0.0 0.0 - 0.1 K/uL   Immature Granulocytes 0 %   Abs Immature Granulocytes 0.03 0.00 - 0.07 K/uL  C-reactive protein  Result Value  Ref Range   CRP 7.9 (H) <1.0 mg/dL  Sedimentation rate  Result Value Ref Range   Sed Rate 78 (H) 0 - 22 mm/hr  I-Stat arterial blood gas, ED  Result Value Ref Range   pH, Arterial 7.467 (H) 7.350 - 7.450   pCO2 arterial 35.1 32.0 - 48.0 mmHg   pO2, Arterial 67 (L) 83.0 - 108.0 mmHg   Bicarbonate 25.4 20.0 - 28.0 mmol/L   TCO2 26 22 - 32 mmol/L   O2 Saturation 94.0 %  Acid-Base Excess 2.0 0.0 - 2.0 mmol/L   Sodium 138 135 - 145 mmol/L   Potassium 3.3 (L) 3.5 - 5.1 mmol/L   Calcium, Ion 1.18 1.15 - 1.40 mmol/L   HCT 32.0 (L) 36.0 - 46.0 %   Hemoglobin 10.9 (L) 12.0 - 15.0 g/dL   Collection site Radial    Drawn by RT    Sample type ARTERIAL   Troponin I (High Sensitivity)  Result Value Ref Range   Troponin I (High Sensitivity) 11 <18 ng/L  Troponin I (High Sensitivity)  Result Value Ref Range   Troponin I (High Sensitivity) 12 <18 ng/L    This visit occurred during the SARS-CoV-2 public health emergency.  Safety protocols were in place, including screening questions prior to the visit, additional usage of staff PPE, and extensive cleaning of exam room while observing appropriate contact time as indicated for disinfecting solutions.   COVID 19 screen:  No recent travel or known exposure to COVID19 The patient denies respiratory symptoms of COVID 19 at this time. The importance of social distancing was discussed today.   Assessment and Plan Problem List Items Addressed This Visit    Acute respiratory failure with hypoxia ([place])    Continued oxygen requirement.      Anemia    Re-eval for stability, check iron levels as well.      Relevant Orders   CBC with Differential/Platelet (Completed)   IBC + Ferritin (Completed)   Hypokalemia - Primary    Due for lab re-eval.      Relevant Orders   Comprehensive metabolic panel (Completed)   ILD (interstitial lung disease) ([place])     Followed by pulmonary            Eliezer Lofts, MD

## 2020-11-07 NOTE — Patient Instructions (Signed)
Please stop at the lab to have labs drawn.  keep appt with pulmonary.

## 2020-11-08 ENCOUNTER — Encounter: Payer: Self-pay | Admitting: Internal Medicine

## 2020-11-08 ENCOUNTER — Other Ambulatory Visit: Payer: Self-pay

## 2020-11-08 ENCOUNTER — Ambulatory Visit (INDEPENDENT_AMBULATORY_CARE_PROVIDER_SITE_OTHER): Payer: Medicare Other | Admitting: Internal Medicine

## 2020-11-08 VITALS — BP 120/60 | HR 91 | Temp 98.6°F | Ht 64.0 in | Wt 205.0 lb

## 2020-11-08 DIAGNOSIS — R911 Solitary pulmonary nodule: Secondary | ICD-10-CM | POA: Diagnosis not present

## 2020-11-08 DIAGNOSIS — Z86711 Personal history of pulmonary embolism: Secondary | ICD-10-CM | POA: Diagnosis not present

## 2020-11-08 DIAGNOSIS — R0600 Dyspnea, unspecified: Secondary | ICD-10-CM | POA: Diagnosis not present

## 2020-11-08 DIAGNOSIS — R0609 Other forms of dyspnea: Secondary | ICD-10-CM

## 2020-11-08 NOTE — Patient Instructions (Addendum)
ICD-10-CM   1. Dyspnea on exertion  R06.00   2. Pulmonary nodule  R91.1   3. History of pulmonary embolism  Z86.711     Do HRCT supine and prone Do echo Do full PFT Do ONO on room air  followup  - Dr Chase Caller next few to several weeks afte completing above to regroup

## 2020-11-08 NOTE — Progress Notes (Signed)
HPI  PCP [name], MD  HPI  IOV 02/22/2018   [name] presents on behalf of Dr. Leigh Aurora and his team for evaluation of shortness of breath and concern for possible interstitial lung disease seen on chest x-ray.  History is given by the patient and review of the chart including old medical records and visualization of the films mentioned.  According to the patient she was diagnosed with rheumatoid arthritis not otherwise specified approximately 3 years ago.  For the first year she was on methotrexate which caused weight gain and itchiness and this was stopped.  Subsequently was on leflunomide which caused diarrhea and 16 pound weight loss and therefore had to be stopped in 2018.  Early in 2019 she was started on Enbrel which she believes is causing weight gain although when I asked her if her weight gain was just a reflection of her not being on [place] she conceded that it might not be the Enbrel directly causing the weight gain.  Nevertheless she has had weight gain with a significant and documented below.  Associated with his weight gain has been insidious onset of shortness of breath with exertion relieved by rest.  She notices shortness of breath when walking up an incline compared to her peers and she has to stop to rest.  There is no associated chest pain or proximal nocturnal dyspnea or orthopnea wheezing or cough or edema.  She says the most of the time she does not subjectively feel the dyspnea but then she is noticed to be visibly dyspneic by the family or onlookers.  Such was the case when she walked in our office today when she got tachycardic without any desaturation.  American College of chest physicians interstitial lung disease questionnaire  Symptoms: She has occasional cough but not bothersome.  Does not cough at night does not wake her up.  She walks slower than people of her age.  Started 6 months ago  Past medical history: She says she has a "fat  heart" although 2013 echocardiogram that I reviewed was normal.  She denies any weight loss or dysphagia or heartburn or acid reflux or dry eyes or chest pain or ongoing arthralgia  Personal exposure history: She never smoked any recreational drugs.  She started smoking at age 16 smoked half a pack a day and quit when she was 34.  Family history of lung disease: This emphysema in her brother but no family history of pulmonary fibrosis  Home environment history: He does not old house.  There is no humidifier or insomnia or hot tub or Jacuzzi or water damage.  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  Simple office walk 185 feet x  3 laps goal with forehead probe 02/22/2018   O2 used Room air  Number laps completed 3 all laps  Comments about pace Normal pace  Resting Pulse Ox/HR 98% and 85/min  Final Pulse Ox/HR 97% and 121/min  Desaturated </= 88% no  Desaturated <= 3% points no  Got Tachycardic >/= 90/min yes  Symptoms at end of test Visibly Dyspneic and improved with rest. No chest  pain or cough but no subjective dyspnea  Miscellaneous comments none     OV 03/15/2018  Chief Complaint  Patient presents with  . Follow-up    Echo and HRCT performed 6/6 and PFT performed 6/7.  Pt states she has been doing well since last visit and denies any complaints.   [name] returns for follow-up after doing work-up for shortness of breath in the setting of rheumatoid arthritis with a specific question of ruling out interstitial lung disease   She returns with her husband as before.  This time she is accompanied by  her daughter who lives in [place], [place].  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  This back pain preceded the diagnosis of rheumatoid arthritis.  It is relieved by rest.  Her work-up shows normal pulmonary function test including DLCO but the radiologist did call for high-resolution CT chest findings of indeterminate for UIP interstitial lung disease.  There is also an additional 4 mm right upper lobe nodule.  Her echocardiogram itself is normal.  There is an additional finding of coronary artery calcification on the CT chest.  We discussed all this in detail.    IMPRESSION: HRCT 1. Findings do suggest interstitial lung disease, but at this time, the CT pattern is considered indeterminate for usual interstitial pneumonia (UIP). Repeat high-resolution chest CT is suggested in 12 months to assess for temporal changes in the appearance of the lung parenchyma. 2. 4 mm right upper lobe pulmonary nodule. This is nonspecific but statistically likely benign. Attention at time of repeat high-resolution chest CT is recommended. 3. Aortic atherosclerosis, in addition to left main and left anterior descending coronary artery disease. Assessment for potential risk factor modification, dietary therapy or pharmacologic therapy may be warranted, if clinically indicated. 4. Hepatic steatosis.  Aortic Atherosclerosis (ICD10-I70.0).   Electronically Signed   By: Trudie Reed M.D.   On: 03/10/2018 12:02  PFT   Results for TYIA, MCGRAIN (MRN [id number]) as of 03/15/2018 10:03  Ref. Range 03/11/2018 11:38  FVC-Pre Latest Units: L 2.06  FVC-%Pred-Pre Latest Units: % 81  FEV1-Pre Latest Units: L 1.54  FEV1-%Pred-Pre Latest Units: % 81  Pre FEV1/FVC ratio Latest Units: % 75  FEV1FVC-%Pred-Pre Latest Units: % 100  FEF  25-75 Pre Latest Units: L/sec 1.11  FEF2575-%Pred-Pre Latest Units: % 79  FEV6-Pre Latest Units: L 2.06  FEV6-%Pred-Pre Latest Units: % 86  Pre FEV6/FVC Ratio Latest Units: % 100  FEV6FVC-%Pred-Pre Latest Units: % 106  DLCO unc Latest Units: ml/min/mmHg 18.80  DLCO unc % pred Latest Units: % 82  DL/VA Latest Units: ml/min/mmHg/L 4.78  DL/VA % pred Latest Units: % 102     Study Conclusions - ECHO 03/10/18  - Left ventricle: The cavity size was normal. Systolic function was   normal. The estimated ejection fraction was in the range of 60%   to 65%. Wall motion was normal; there were no regional wall   motion abnormalities. Doppler parameters are consistent with   abnormal left ventricular relaxation (grade 1 diastolic   dysfunction). Doppler parameters are consistent with elevated   ventricular end-diastolic filling pressure. - Aortic valve: There was no regurgitation. - Mitral valve: Calcified annulus. Mildly thickened leaflets .   There was mild regurgitation. - Right ventricle: The cavity size was normal. Wall thickness was  normal. Systolic function was normal. - Pulmonary arteries: Systolic pressure was within the normal   range. - Inferior vena cava: The vessel was normal in size. - Pericardium, extracardiac: There was no pericardial effusion.   has a past medical history of Cataract, Dysrhythmia, GERD (gastroesophageal reflux disease), Hyperlipidemia, Hypertension, Migraines, Obesity, Osteoarthritis, and Phlebitis of leg, right, superficial (1986/1989).   reports that she quit smoking about 40 years ago.      OV 04/24/2020  Subjective:  Patient ID: [name], female , DOB: Mar 28, 1941 , age 79 y.o. , MRN: [id number] , ADDRESS: [address] [place] [facility] [address]   04/24/2020 -   Chief Complaint  Patient presents with  . Follow-up    follow up from recent hospitalization for pulmonary embolism     HPI SAESHA LLERENAS 80 y.o. -presents with her  daughter for follow-up.  Not seen her since 2019.  Was supposed to repeat a high-resolution CT chest in case she has ILD or not.  The end of June 2021 after a trip to the mountains and also recently being sedentary she developed mild pulmonary embolism and was admitted.  She was admitted just for 1 day.  She was discharged on night oxygen which is not using.  She is on Eliquis which she is taking.  She says she is improved but she still has residual fatigue and dyspnea on exertion and some chest tightness.  No bleeding episodes.  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  Dr. Lindi Adie her oncologist has done some hypercoagulable work-up according to history.  Of note her CT chest did not show any evidence of right heart strain.  Shows right upper lobe nodule which is reported as new but the old CT also reports right upper lobe nodule.     Simple office walk 185 feet x  3 laps goal with forehead probe 02/22/2018  04/24/2020   O2 used Room air ra  Number laps completed 3 all laps 3  Comments about pace Normal pace Nl pace  Resting Pulse Ox/HR 98% and 85/min 99% and 76/min  Final Pulse Ox/HR 97% and 121/min 99% and 129/minn  Desaturated </= 88% no no  Desaturated <= 3% points no no  Got Tachycardic >/= 90/min yes yes  Symptoms at end of test Visibly Dyspneic and improved with rest. No chest pain or cough but no subjective dyspnea Denied dyspnea but visibly dyspenic to CMA  Miscellaneous comments none x    ECHO 04/01/20  Sonographer Comments: Image acquisition challenging due to patient body  habitus.  IMPRESSIONS   1. Left ventricular ejection fraction, by estimation, is 55 to 60%. The  left ventricle has normal function. The left ventricle has no regional  wall motion abnormalities. Left ventricular diastolic parameters are  consistent with Grade I diastolic  dysfunction (impaired relaxation).  2. Right ventricular systolic function is normal. The right ventricular  size is normal. There is  mildly elevated pulmonary artery systolic  pressure.  3. The mitral valve is normal in structure. Mild mitral valve  regurgitation.  4. The aortic valve is normal in structure. Aortic valve regurgitation is  trivial.   Comparison(s): The left ventricular function is unchanged.   ROS - per HPI  IMPRESSION: ct angio June 2021 The examination is positive for pulmonary embolus without evidence of right heart strain.  0.4 cm right upper lobe pulmonary nodule is new since the prior CT. No follow-up needed if patient is low-risk. Non-contrast chest CT can be considered  in 12 months if patient is high-risk. This recommendation follows the consensus statement: Guidelines for Management of Incidental Pulmonary Nodules Detected on CT Images: From the Fleischner Society 2017; Radiology 2017; 284:228-243.  Fatty infiltration of the liver.  Gallstone without evidence of cholecystitis.  Aortic Atherosclerosis (ICD10-I70.0).  Critical Value/emergent results were called by telephone at the time of interpretation on 04/01/2020 at 2:00 pm to provider Gwenith Daily, RN, who verbally acknowledged these results.   OV 11/08/2020  Subjective:  Patient ID: [name], female , DOB: 09/17/1941 , age 55 y.o. , MRN: [id number] , ADDRESS: [address] [place] [address] PCP [name], MD Patient Care Team: [name], MD as PCP - General Josue Hector, MD as PCP - Cardiology (Cardiology) Rosita Kea, PA-C as Physician Assistant (Rheumatology) Christain Sacramento, Parker as Referring Physician (Optometry) Danella Sensing, MD as Consulting Physician (Dermatology) Mauro Kaufmann, RN as Oncology Nurse Navigator Rockwell Germany, RN as Oncology Nurse Navigator  This Provider for this visit: Treatment Team:  Attending Provider: Brand Males, MD    11/08/2020 -   Chief Complaint  Patient presents with  . Follow-up    Got out of the hospital on 1/27, thought she may have  blood clots. SOB has gotten worse with exertion   Follow-up dyspnea with suspected ILD in the setting of rheumatoid arthritis, obesity and grade 1 diastolic dysfunction in June 2021 echo and history of pulmonary embolism June 2021   HPI DARRIS GAMBLER 80 y.o. -returns for follow-up.  Last seen in the summer 2021.  She is accompanied by her daughter.  The other daughter and is on the speaker phone.  There is concern for interstitial lung disease based on 2019 CT chest in the history of rheumatoid arthritis.  But then because of the pandemic she could not follow-up.  Then in the summer 2021 when she followed up she had pulmonary embolism and had a CT angiogram chest.  The plan then was to continue the Eliquis and to reassess for high-resolution CT chest sometime in 2022.  She returns for follow-up.  She tells me that around Thanksgiving 2021 she started methotrexate 6 pills once a week on Saturdays.  Then sometime around end of December 2021 early January 2022 she started having several symptoms of nausea and headaches and feverishness and shortness of breath.  Covid was ruled out.  She admitted up getting admitted in the hospital.  CT angiogram chest ruled out pulmonary embolism.  I personally reviewed the CT scan and visualized it.  This was on October 31, 2020.  At the time of admission she was having symptoms for a few weeks.  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  The family is very concerned about this diagnosis.  She tells me however she was never treated with oxygen while in the hospital.  However she was discharged with oxygen and prednisone taper.  The prednisone taper is ending in a few days.  The methotrexate has been hold temporarily.  She has missed 1 week and dose last weekend.  The combination of the admission and the steroids and holding the methotrexate has improved her symptoms and her symptoms have resolved.  She has not been using the oxygen she was  discharged with.  Review of the records show that her pulse ox was in the low 90s on room air in the hospital.  She had a CT angiogram chest that ruled out pulmonary embolism.  No comment on ILD but it was a contrast CT scan.  High-sensitivity troponin x2 is normal.  She had asymptomatic cholelithiasis.  D-dimer was normal Covid PCR normal.   She was given a diagnosis of ILD flareup because in the emergency room she was desaturating to the low 80s with ambulation although she did not give this history at this visit.  This is based on review of records.  In the office today she did not desaturate.  The family is very concerned.  They have several questions and/concerns  -Etiology for dyspnea -this has been ongoing even since I last saw her in summer 2021 -Presence of interstitial lung disease are not -Implications of diagnosis of ILD flareup diagnosis given to them -Whether symptoms were part of methotrexate toxicity and the future role of methotrexate in treating her if she were to have ILD       SYMPTOM SCALE - ILD 11/08/2020   O2 use ra  Shortness of Breath 0 -> 5 scale with 5 being worst (score 6 If unable to do)  At rest 0  Simple tasks - showers, clothes change, eating, shaving 2  Household (dishes, doing bed, laundry) 2  Shopping 2  Walking level at own pace 4  Walking up Stairs 3  Total (30-36) Dyspnea Score 13  How bad is your cough? 3  How bad is your fatigue 5  How bad is nausea 0  How bad is vomiting?  00  How bad is diarrhea? 0  How bad is anxiety? 0  How bad is depression 0       Simple office walk 185 feet x  3 laps goal with forehead probe 02/22/2018  04/24/2020  11/08/2020   O2 used Room air ra ra  Number laps completed 3 all laps 3 2  Comments about pace Normal pace Nl pace 98$ and 90/min  Resting Pulse Ox/HR 98% and 85/min 99% and 76/min 94% and HR 130  Final Pulse Ox/HR 97% and 121/min 99% and 129/minn Slow pace  Desaturated </= 88% no no   Desaturated  <= 3% points no no   Got Tachycardic >/= 90/min yes yes   Symptoms at end of test Visibly Dyspneic and improved with rest. No chest pain or cough but no subjective dyspnea Denied dyspnea but visibly dyspenic to CMA   Miscellaneous comments none x         PFT  PFT Results Latest Ref Rng & Units 03/11/2018  FVC-Pre L 2.06  FVC-Predicted Pre % 81  Pre FEV1/FVC % % 75  FEV1-Pre L 1.54  FEV1-Predicted Pre % 81  DLCO uncorrected ml/min/mmHg 18.80  DLCO [facility]% % 82  DLVA Predicted % 102       has a past medical history of Cancer ([place]), Cataract, Dysrhythmia, GERD (gastroesophageal reflux disease), Hyperlipidemia, Hypertension, Migraines, Obesity, Osteoarthritis, Personal history of radiation therapy, and Phlebitis of leg, right, superficial (1986/1989).   reports that she quit smoking about 42 years ago. She has a 5.00 pack-year smoking history. She has never used smokeless tobacco.  Past Surgical History:  Procedure Laterality Date  . BREAST BIOPSY Left 07/05/2019  . BREAST BIOPSY Bilateral 08/24/2019  . BREAST EXCISIONAL BIOPSY Left 09/20/2019  . BREAST LUMPECTOMY Right 09/20/2019  . BREAST LUMPECTOMY WITH RADIOACTIVE SEED LOCALIZATION Bilateral 09/20/2019   Procedure: BILATERAL BREAST LUMPECTOMIES WITH BILATERAL RADIOACTIVE SEED LOCALIZATION AND RIGHT NIPPLE BIOPSY;  Surgeon: Erroll Luna, MD;  Location: [facility];  Service: General;  Laterality: Bilateral;  .  CARDIAC CATHETERIZATION  1990s   negative  . CARDIOVASCULAR STRESS TEST  2008   NML  . CARDIOVASCULAR STRESS TEST  02/02/2007   EF 70%, NO ISCHEMIA  . DILATION AND CURETTAGE, DIAGNOSTIC / THERAPEUTIC  2008  . LESION DESTRUCTION  10/2017   Face; Dr. Ronnald Ramp  . ROTATOR CUFF REPAIR  1999   Right, left in 2002  . 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 TOTAL KNEE ARTHROPLASTY  10/11/2012   Procedure: TOTAL KNEE ARTHROPLASTY;  Surgeon: Mauri Pole, MD;  Location: [facility];  Service: Orthopedics;  Laterality:  Right;  . US ECHOCARDIOGRAPHY  02/07/2007   EF 55-60%    Allergies  Allergen Reactions  . Codeine Nausea And Vomiting  . Hydrocodone-Homatropine Other (See Comments)    Hallucinations     Immunization History  Administered Date(s) Administered  . Fluad Quad(high Dose 65+) 06/27/2019, 08/13/2020  . Influenza,inj,Quad PF,6+ Mos 08/05/2017, 08/04/2018  . PFIZER(Purple Top)SARS-COV-2 Vaccination 10/20/2019, 11/10/2019, 05/28/2020  . Pneumococcal Conjugate-13 07/10/2014  . Pneumococcal Polysaccharide-23 01/04/2008  . Td 10/05/1996, 01/04/2008  . Zoster 02/01/2008    Family History  Problem Relation Age of Onset  . Heart attack Father 9       PUD  . Hypertension Father   . Lung cancer Brother   . Heart defect Sister   . Hypertension Mother   . Coronary artery disease Other        Female 1st degree relative <50     Current Outpatient Medications:  .  acetaminophen (TYLENOL) 500 MG tablet, Take 1,000 mg by mouth in the morning and at bedtime., Disp: , Rfl:  .  benzonatate (TESSALON) 200 MG capsule, Take 1 capsule (200 mg total) by mouth 2 (two) times daily as needed for cough., Disp: 20 capsule, Rfl: 0 .  folic acid (FOLVITE) 1 MG tablet, Take 2 mg by mouth daily., Disp: , Rfl:  .  hydrochlorothiazide (HYDRODIURIL) 25 MG tablet, Take 0.5 tablets (12.5 mg total) by mouth every other day., Disp: 45 tablet, Rfl: 1 .  methotrexate 2.5 MG tablet, Take 15 mg by mouth once a week., Disp: , Rfl:  .  predniSONE (DELTASONE) 5 MG tablet, Take 4 tablets (20 mg total) by mouth daily with breakfast for 3 days, THEN 2 tablets (10 mg total) daily with breakfast for 3 days, THEN 1 tablet (5 mg total) daily with breakfast for 5 days., Disp: 23 tablet, Rfl: 0 .  rivaroxaban (XARELTO) 20 MG TABS tablet, Take 1 tablet (20 mg total) by mouth daily with supper., Disp: 90 tablet, Rfl: 3      Objective:   Vitals:   11/08/20 1036  BP: 120/60  Pulse: 91  Temp: 98.6 F (37 C)  Weight: 205 lb (93 kg)   Height: 5\' 4"  (1.626 m)    Estimated body mass index is 35.19 kg/m as calculated from the following:   Height as of this encounter: 5\' 4"  (1.626 m).   Weight as of this encounter: 205 lb (93 kg).  @WEIGHTCHANGE @  Filed Weights   11/08/20 1036  Weight: 205 lb (93 kg)     Physical Exam General: No distress. obese Neuro: Alert and Oriented x 3. GCS 15. Speech normal Psych: Pleasant Resp:  Barrel Chest - no.  Wheeze - no, Crackles - maybe at base. Not sure, No overt respiratory distress CVS: Normal heart sounds. Murmurs - no Ext: Stigmata of Connective Tissue Disease - no HEENT: Normal upper airway. PEERL +. No post  nasal drip        Assessment:       ICD-10-CM   1. Dyspnea on exertion  R06.00 CT Chest High Resolution    ECHOCARDIOGRAM COMPLETE    Pulmonary Function Test    Pulse oximetry, overnight  2. Pulmonary nodule  R91.1 CT Chest High Resolution  3. History of pulmonary embolism  Z86.711 CT Chest High Resolution   Not 100% sure she had pulmonary fibrosis flareup.  She is at risk candidate for ILD i and the potential ILD if present D was very mild in 2019.  ILD flareup would result in significant decompensation and very seldom return to baseline.  She does have dyspnea on exertion relieved by rest.  This needs to be sorted out  Unclear what exactly why the multitude side effects.  Its possible is because of 6 tablets a week of methotrexate in any event is resolved  I to start with establishing baseline reasons for dyspnea by getting echocardiogram high-resolution CT chest full pulmonary function test and on on room air.  She understands she she has had 2 CT scans of the chest in 6 months where angiogram has been given but at this point in time we do need to put the history of ILD to rest therefore she needs a high-resolution CT chest.  We will then regroup.  For now it is okay to resume methotrexate at the proposed lower dose of 4 tablets once a week for rheumatology  at least from a pulmonary perspective.  Symptoms allowed to be closely monitored.  Based on the above work-up further changes in immunomodulatory therapy will have to be discussed with rheumatology.  Family had questions about this and answered advised that it is too early to give specific answers without getting baseline evaluation complete       Plan:     Patient Instructions     ICD-10-CM   1. Dyspnea on exertion  R06.00   2. Pulmonary nodule  R91.1   3. History of pulmonary embolism  Z86.711     Do HRCT supine and prone Do echo Do full PFT Do ONO on room air  followup  - Dr Chase Caller next few to several weeks afte completing above to regroup     ( Level 05 visit: Estb 40-54 min   in  visit type: on-site physical face to visit  in total care time and counseling or/and coordination of care by this undersigned MD - Dr Brand Males. This includes one or more of the following on this same day 11/08/2020: pre-charting, chart review, note writing, documentation discussion of test results, diagnostic or treatment recommendations, prognosis, risks and benefits of management options, instructions, education, compliance or risk-factor reduction. It excludes time spent by the [place] or office staff in the care of the patient. Actual time 57 min)   SIGNATURE    Dr. Brand Males, M.D., F.C.C.P,  Pulmonary and Critical Care Medicine Staff Physician, [place] Director - Interstitial Lung Disease  Program  Pulmonary [facility] [place], [place], [address]  Pager: [phone], If no answer or between  15:00h - 7:00h: call [phone] Telephone: [phone]  12:48 PM 11/08/2020

## 2020-11-13 ENCOUNTER — Telehealth: Payer: Self-pay | Admitting: Internal Medicine

## 2020-11-15 NOTE — Telephone Encounter (Signed)
Pre cert has been done no pa req it looks like the echo is scheduled 12/09/20 Joellen Jersey

## 2020-11-15 NOTE — Telephone Encounter (Signed)
Patient is aware of below message. She voiced her understanding and had no further questions.  Nothing further needed.

## 2020-11-25 ENCOUNTER — Other Ambulatory Visit: Payer: Self-pay

## 2020-11-25 ENCOUNTER — Ambulatory Visit
Admission: RE | Admit: 2020-11-25 | Discharge: 2020-11-25 | Disposition: A | Discharge status: Home / Self Care | Payer: Medicare Other | Source: Ambulatory Visit | Attending: Internal Medicine | Admitting: Internal Medicine

## 2020-11-25 DIAGNOSIS — R911 Solitary pulmonary nodule: Secondary | ICD-10-CM

## 2020-11-25 DIAGNOSIS — R0600 Dyspnea, unspecified: Secondary | ICD-10-CM

## 2020-11-25 DIAGNOSIS — Z86711 Personal history of pulmonary embolism: Secondary | ICD-10-CM

## 2020-11-25 DIAGNOSIS — R0609 Other forms of dyspnea: Secondary | ICD-10-CM

## 2020-11-26 ENCOUNTER — Encounter: Payer: Self-pay | Admitting: Internal Medicine

## 2020-11-29 ENCOUNTER — Other Ambulatory Visit (HOSPITAL_COMMUNITY)
Admission: RE | Admit: 2020-11-29 | Discharge: 2020-11-29 | Disposition: A | Discharge status: Home / Self Care | Payer: Medicare Other | Source: Ambulatory Visit | Attending: Internal Medicine | Admitting: Internal Medicine

## 2020-11-29 DIAGNOSIS — Z20822 Contact with and (suspected) exposure to covid-19: Secondary | ICD-10-CM | POA: Insufficient documentation

## 2020-11-29 DIAGNOSIS — Z01812 Encounter for preprocedural laboratory examination: Secondary | ICD-10-CM | POA: Insufficient documentation

## 2020-11-29 LAB — SARS CORONAVIRUS 2 (TAT 6-24 HRS): SARS Coronavirus 2: NEGATIVE

## 2020-12-01 NOTE — Progress Notes (Signed)
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 KEep appt end of march 2022. Pls let her know

## 2020-12-03 ENCOUNTER — Other Ambulatory Visit: Payer: Self-pay

## 2020-12-03 ENCOUNTER — Ambulatory Visit (INDEPENDENT_AMBULATORY_CARE_PROVIDER_SITE_OTHER): Payer: Medicare Other | Admitting: Internal Medicine

## 2020-12-03 DIAGNOSIS — R0600 Dyspnea, unspecified: Secondary | ICD-10-CM

## 2020-12-03 DIAGNOSIS — R0609 Other forms of dyspnea: Secondary | ICD-10-CM

## 2020-12-03 LAB — PULMONARY FUNCTION TEST
DL/VA % pred: 112 %
DL/VA: 4.63 ml/min/mmHg/L
DLCO cor % pred: 96 %
DLCO cor: 17.41 ml/min/mmHg
DLCO unc % pred: 94 %
DLCO unc: 17.08 ml/min/mmHg
FEF 25-75 Post: 1.28 L/sec
FEF 25-75 Pre: 1.11 L/sec
FEF2575-%Change-Post: 15 %
FEF2575-%Pred-Post: 98 %
FEF2575-%Pred-Pre: 85 %
FEV1-%Change-Post: 2 %
FEV1-%Pred-Post: 85 %
FEV1-%Pred-Pre: 82 %
FEV1-Post: 1.54 L
FEV1-Pre: 1.5 L
FEV1FVC-%Change-Post: 3 %
FEV1FVC-%Pred-Pre: 101 %
FEV6-%Change-Post: 0 %
FEV6-%Pred-Post: 86 %
FEV6-%Pred-Pre: 86 %
FEV6-Post: 2 L
FEV6-Pre: 2 L
FEV6FVC-%Pred-Post: 106 %
FEV6FVC-%Pred-Pre: 106 %
FVC-%Change-Post: 0 %
FVC-%Pred-Post: 81 %
FVC-%Pred-Pre: 81 %
FVC-Post: 2 L
FVC-Pre: 2 L
Post FEV1/FVC ratio: 77 %
Post FEV6/FVC ratio: 100 %
Pre FEV1/FVC ratio: 75 %
Pre FEV6/FVC Ratio: 100 %
RV % pred: 77 %
RV: 1.84 L
TLC % pred: 78 %
TLC: 3.84 L

## 2020-12-03 NOTE — Progress Notes (Signed)
Full PFT performed today. °

## 2020-12-05 ENCOUNTER — Telehealth: Payer: Self-pay | Admitting: Internal Medicine

## 2020-12-05 DIAGNOSIS — G4734 Idiopathic sleep related nonobstructive alveolar hypoventilation: Secondary | ICD-10-CM

## 2020-12-05 NOTE — Telephone Encounter (Signed)
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 KEep appt end of march 2022. Pls let her know    Called and spoke with pt and she stated that she will keep her appt with MR the end of the month.   Pt also stated that she went to [facility] and had her overnight sleep study done and she still has not received these results.  She is a little concerned.  MR please advise. Thanks

## 2020-12-06 NOTE — Telephone Encounter (Signed)
I have called and LM on VM for the pt to call back for results.  

## 2020-12-06 NOTE — Telephone Encounter (Signed)
Overnight pulse ox study done on 11/25/2020: Time spent less than equal to 88% was 3 hours and 29 minutes and 26 seconds  Plan -Start 2 L of nasal cannula oxygen at night

## 2020-12-09 ENCOUNTER — Ambulatory Visit (HOSPITAL_COMMUNITY): Payer: Medicare Other | Attending: Cardiology

## 2020-12-09 ENCOUNTER — Other Ambulatory Visit: Payer: Self-pay

## 2020-12-09 DIAGNOSIS — R0600 Dyspnea, unspecified: Secondary | ICD-10-CM | POA: Insufficient documentation

## 2020-12-09 DIAGNOSIS — R0609 Other forms of dyspnea: Secondary | ICD-10-CM

## 2020-12-09 LAB — ECHOCARDIOGRAM COMPLETE
Area-P 1/2: 2.83 cm2
S' Lateral: 3.3 cm

## 2020-12-10 NOTE — Telephone Encounter (Signed)
Lmtcb for pt.  

## 2020-12-17 NOTE — Telephone Encounter (Signed)
Patient called office, confirmed DOB. Made aware of ONO results. Patient already has oxygen at night. Made aware to use 2L at night. Aware I will send order to [facility] to update their records. Patient is wanting to have the other tanks picked up as she has not used the oxygen since she left the hospital.   MR please advise if okay to send order to D/C day time oxygen so patient can get rid of extra tanks at her request.

## 2020-12-17 NOTE — Telephone Encounter (Signed)
lmtcb for pt on both numbers.   Approaching 30 days since the ONO, if an o2 order exceeds the 30 day window then a new ONO will be needed to qualify pt.

## 2020-12-18 ENCOUNTER — Ambulatory Visit: Payer: Medicare Other | Admitting: Hematology and Oncology

## 2020-12-19 NOTE — Telephone Encounter (Signed)
Ok to [employer] extra o2 tanks

## 2020-12-20 NOTE — Telephone Encounter (Signed)
Spoke with pt and informed here order had been placed with [employer] to pick up extra O2 tanks. Pt stated understanding. Order placed. Nothing further needed at this time.

## 2020-12-25 ENCOUNTER — Encounter: Payer: Self-pay | Admitting: Internal Medicine

## 2020-12-25 ENCOUNTER — Other Ambulatory Visit: Payer: Self-pay

## 2020-12-25 ENCOUNTER — Ambulatory Visit (INDEPENDENT_AMBULATORY_CARE_PROVIDER_SITE_OTHER): Payer: Medicare Other | Admitting: Internal Medicine

## 2020-12-25 VITALS — BP 120/70 | HR 75 | Temp 97.7°F | Ht 64.0 in | Wt 209.4 lb

## 2020-12-25 DIAGNOSIS — J8489 Other specified interstitial pulmonary diseases: Secondary | ICD-10-CM | POA: Diagnosis not present

## 2020-12-25 DIAGNOSIS — Z7189 Other specified counseling: Secondary | ICD-10-CM

## 2020-12-25 DIAGNOSIS — Z7185 Encounter for immunization safety counseling: Secondary | ICD-10-CM

## 2020-12-25 DIAGNOSIS — G4734 Idiopathic sleep related nonobstructive alveolar hypoventilation: Secondary | ICD-10-CM | POA: Diagnosis not present

## 2020-12-25 DIAGNOSIS — Z79899 Other long term (current) drug therapy: Secondary | ICD-10-CM

## 2020-12-25 DIAGNOSIS — M359 Systemic involvement of connective tissue, unspecified: Secondary | ICD-10-CM

## 2020-12-25 DIAGNOSIS — D84821 Immunodeficiency due to drugs: Secondary | ICD-10-CM

## 2020-12-25 NOTE — Patient Instructions (Signed)
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 -Interstitial lung disease is stable between June 2019 and March 2022 -The future course of this is most likely 1 of stability at least for the next 1 or 2 years -Currently antifibrotic's are not indicated due to stability -Currently no clinical trial available for this particular situation  Plan -Continue nighttime oxygen -Referral pulmonary rehabilitation -Serial monitoring with supportive care is recommended approach at this point  -Do spirometry and DLCO in 6 months -Treatment of rheumatoid arthritis per primary care physician/rheumatologist  Shortness of breath on exertion  -This is due to interstitial lung disease/pulmonary fibrosis, diastolic dysfunction, weight issues and rheumatoid arthritis  Plan -Referral pulmonary rehabilitation  Vaccine counseling Advice given about COVID-19 virus infection Immunosuppression due to drug therapy ([place])  -You are going to start Rituxan therapy  Plan -Check Covid IgG and if this is negative we will have to consider fourth dose Covid vaccine versus monoclonal antibody prophylaxis  Follow-up -6 months do spirometry and DLCO -Return to see Dr. Chase Caller in 30 minutes slot in 6 months

## 2020-12-25 NOTE — Addendum Note (Signed)
Addended by: Lorretta Harp on: 12/25/2020 11:29 AM   Modules accepted: Orders

## 2020-12-25 NOTE — Addendum Note (Signed)
Addended by: Suzzanne Cloud E on: 12/25/2020 11:32 AM   Modules accepted: Orders

## 2020-12-25 NOTE — Progress Notes (Signed)
HPI  PCP [name], MD  HPI  IOV 02/22/2018   [name] presents on behalf of Dr. Leigh Aurora and his team for evaluation of shortness of breath and concern for possible interstitial lung disease seen on chest x-ray.  History is given by the patient and review of the chart including old medical records and visualization of the films mentioned.  According to the patient she was diagnosed with rheumatoid arthritis not otherwise specified approximately 3 years ago.  For the first year she was on methotrexate which caused weight gain and itchiness and this was stopped.  Subsequently was on leflunomide which caused diarrhea and 16 pound weight loss and therefore had to be stopped in 2018.  Early in 2019 she was started on Enbrel which she believes is causing weight gain although when I asked her if her weight gain was just a reflection of her not being on [place] she conceded that it might not be the Enbrel directly causing the weight gain.  Nevertheless she has had weight gain with a significant and documented below.  Associated with his weight gain has been insidious onset of shortness of breath with exertion relieved by rest.  She notices shortness of breath when walking up an incline compared to her peers and she has to stop to rest.  There is no associated chest pain or proximal nocturnal dyspnea or orthopnea wheezing or cough or edema.  She says the most of the time she does not subjectively feel the dyspnea but then she is noticed to be visibly dyspneic by the family or onlookers.  Such was the case when she walked in our office today when she got tachycardic without any desaturation.  American College of chest physicians interstitial lung disease questionnaire  Symptoms: She has occasional cough but not bothersome.  Does not cough at night does not wake her up.  She walks slower than people of her age.  Started 6 months ago  Past medical history: She says she has a "fat heart"  although 2013 echocardiogram that I reviewed was normal.  She denies any weight loss or dysphagia or heartburn or acid reflux or dry eyes or chest pain or ongoing arthralgia  Personal exposure history: She never smoked any recreational drugs.  She started smoking at age 63 smoked half a pack a day and quit when she was 34.  Family history of lung disease: This emphysema in her brother but no family history of pulmonary fibrosis  Home environment history: He does not old house.  There is no humidifier or insomnia or hot tub or Jacuzzi or water damage.  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  Simple office walk 185 feet x  3 laps goal with forehead probe 02/22/2018   O2 used Room air  Number laps completed 3 all laps  Comments about pace Normal pace  Resting Pulse Ox/HR 98% and 85/min  Final Pulse Ox/HR 97% and 121/min  Desaturated </= 88% no  Desaturated <= 3% points no  Got Tachycardic >/= 90/min yes  Symptoms at end of test Visibly Dyspneic and improved with rest. No chest pain  or cough but no subjective dyspnea  Miscellaneous comments none     OV 03/15/2018  Chief Complaint  Patient presents with  . Follow-up    Echo and HRCT performed 6/6 and PFT performed 6/7.  Pt states she has been doing well since last visit and denies any complaints.   [name] returns for follow-up after doing work-up for shortness of breath in the setting of rheumatoid arthritis with a specific question of ruling out interstitial lung disease   She returns with her husband as before.  This time she is accompanied by her  daughter who lives in [place], [place].  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  This back pain preceded the diagnosis of rheumatoid arthritis.  It is relieved by rest.  Her work-up shows normal pulmonary function test including DLCO but the radiologist did call for high-resolution CT chest findings of indeterminate for UIP interstitial lung disease.  There is also an additional 4 mm right upper lobe nodule.  Her echocardiogram itself is normal.  There is an additional finding of coronary artery calcification on the CT chest.  We discussed all this in detail.    IMPRESSION: HRCT 1. Findings do suggest interstitial lung disease, but at this time, the CT pattern is considered indeterminate for usual interstitial pneumonia (UIP). Repeat high-resolution chest CT is suggested in 12 months to assess for temporal changes in the appearance of the lung parenchyma. 2. 4 mm right upper lobe pulmonary nodule. This is nonspecific but statistically likely benign. Attention at time of repeat high-resolution chest CT is recommended. 3. Aortic atherosclerosis, in addition to left main and left anterior descending coronary artery disease. Assessment for potential risk factor modification, dietary therapy or pharmacologic therapy may be warranted, if clinically indicated. 4. Hepatic steatosis.  Aortic Atherosclerosis (ICD10-I70.0).   Electronically Signed   By: Vinnie Langton M.D.   On: 03/10/2018 12:02  PFT   Results for [name], [name] (MRN [id number]) as of 03/15/2018 10:03  Ref. Range 03/11/2018 11:38  FVC-Pre Latest Units: L 2.06  FVC-%Pred-Pre Latest Units: % 81  FEV1-Pre Latest Units: L 1.54  FEV1-%Pred-Pre Latest Units: % 81  Pre FEV1/FVC ratio Latest Units: % 75  FEV1FVC-%Pred-Pre Latest Units: % 100  FEF 25-75  Pre Latest Units: L/sec 1.11  FEF2575-%Pred-Pre Latest Units: % 79  FEV6-Pre Latest Units: L 2.06  FEV6-%Pred-Pre Latest Units: % 86  Pre FEV6/FVC Ratio Latest Units: % 100  FEV6FVC-%Pred-Pre Latest Units: % 106  DLCO unc Latest Units: ml/min/mmHg 18.80  DLCO unc % pred Latest Units: % 82  DL/VA Latest Units: ml/min/mmHg/L 4.78  DL/VA % pred Latest Units: % 102     Study Conclusions - ECHO 03/10/18  - Left ventricle: The cavity size was normal. Systolic function was   normal. The estimated ejection fraction was in the range of 60%   to 65%. Wall motion was normal; there were no regional wall   motion abnormalities. Doppler parameters are consistent with   abnormal left ventricular relaxation (grade 1 diastolic   dysfunction). Doppler parameters are consistent with elevated   ventricular end-diastolic filling pressure. - Aortic valve: There was no regurgitation. - Mitral valve: Calcified annulus. Mildly thickened leaflets .   There was mild regurgitation. - Right ventricle: The cavity size was normal. Wall thickness was  normal. Systolic function was normal. - Pulmonary arteries: Systolic pressure was within the normal   range. - Inferior vena cava: The vessel was normal in size. - Pericardium, extracardiac: There was no pericardial effusion.   has a past medical history of Cataract, Dysrhythmia, GERD (gastroesophageal reflux disease), Hyperlipidemia, Hypertension, Migraines, Obesity, Osteoarthritis, and Phlebitis of leg, right, superficial (1986/1989).   reports that she quit smoking about 40 years ago.      OV 04/24/2020  Subjective:  Patient ID: [name], female , DOB: [birth year] , age 1 y.o. , MRN: [id number] , ADDRESS: [address] [place] [facility] [address]   04/24/2020 -   Chief Complaint  Patient presents with  . Follow-up    follow up from recent hospitalization for pulmonary embolism     HPI LUVERTA KORTE 80 y.o. -presents with her daughter  for follow-up.  Not seen her since 2019.  Was supposed to repeat a high-resolution CT chest in case she has ILD or not.  The end of June 2021 after a trip to the mountains and also recently being sedentary she developed mild pulmonary embolism and was admitted.  She was admitted just for 1 day.  She was discharged on night oxygen which is not using.  She is on Eliquis which she is taking.  She says she is improved but she still has residual fatigue and dyspnea on exertion and some chest tightness.  No bleeding episodes.  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  Dr. Lindi Adie her oncologist has done some hypercoagulable work-up according to history.  Of note her CT chest did not show any evidence of right heart strain.  Shows right upper lobe nodule which is reported as new but the old CT also reports right upper lobe nodule.     Simple office walk 185 feet x  3 laps goal with forehead probe 02/22/2018  04/24/2020   O2 used Room air ra  Number laps completed 3 all laps 3  Comments about pace Normal pace Nl pace  Resting Pulse Ox/HR 98% and 85/min 99% and 76/min  Final Pulse Ox/HR 97% and 121/min 99% and 129/minn  Desaturated </= 88% no no  Desaturated <= 3% points no no  Got Tachycardic >/= 90/min yes yes  Symptoms at end of test Visibly Dyspneic and improved with rest. No chest pain or cough but no subjective dyspnea Denied dyspnea but visibly dyspenic to CMA  Miscellaneous comments none x    ECHO 04/01/20  Sonographer Comments: Image acquisition challenging due to patient body  habitus.  IMPRESSIONS   1. Left ventricular ejection fraction, by estimation, is 55 to 60%. The  left ventricle has normal function. The left ventricle has no regional  wall motion abnormalities. Left ventricular diastolic parameters are  consistent with Grade I diastolic  dysfunction (impaired relaxation).  2. Right ventricular systolic function is normal. The right ventricular  size is normal. There is mildly  elevated pulmonary artery systolic  pressure.  3. The mitral valve is normal in structure. Mild mitral valve  regurgitation.  4. The aortic valve is normal in structure. Aortic valve regurgitation is  trivial.   Comparison(s): The left ventricular function is unchanged.   ROS - per HPI  IMPRESSION: ct angio June 2021 The examination is positive for pulmonary embolus without evidence of right heart strain.  0.4 cm right upper lobe pulmonary nodule is new since the prior CT. No follow-up needed if patient is low-risk. Non-contrast chest CT can be considered  in 12 months if patient is high-risk. This recommendation follows the consensus statement: Guidelines for Management of Incidental Pulmonary Nodules Detected on CT Images: From the Fleischner Society 2017; Radiology 2017; 284:228-243.  Fatty infiltration of the liver.  Gallstone without evidence of cholecystitis.  Aortic Atherosclerosis (ICD10-I70.0).  Critical Value/emergent results were called by telephone at the time of interpretation on 04/01/2020 at 2:00 pm to provider Gwenith Daily, RN, who verbally acknowledged these results.   OV 11/08/2020  Subjective:  Patient ID: [name], female , DOB: [birth year] , age 20 y.o. , MRN: [id number] , ADDRESS: [address] [place] [address] PCP [name], MD Patient Care Team: [name], MD as PCP - General Josue Hector, MD as PCP - Cardiology (Cardiology) Rosita Kea, PA-C as Physician Assistant (Rheumatology) Christain [place], [place] as Referring Physician (Optometry) Danella Sensing, MD as Consulting Physician (Dermatology) Mauro Kaufmann, RN as Oncology Nurse Navigator Rockwell [place], RN as Oncology Nurse Navigator  This Provider for this visit: Treatment Team:  Attending Provider: Brand Males, MD    11/08/2020 -   Chief Complaint  Patient presents with  . Follow-up    Got out of the hospital on 1/27, thought she may have blood  clots. SOB has gotten worse with exertion   Follow-up dyspnea with suspected ILD in the setting of rheumatoid arthritis, obesity and grade 1 diastolic dysfunction in June 2021 echo and history of pulmonary embolism June 2021   HPI ADAMARIZ GILLOTT 80 y.o. -returns for follow-up.  Last seen in the summer 2021.  She is accompanied by her daughter.  The other daughter and is on the speaker phone.  There is concern for interstitial lung disease based on 2019 CT chest in the history of rheumatoid arthritis.  But then because of the pandemic she could not follow-up.  Then in the summer 2021 when she followed up she had pulmonary embolism and had a CT angiogram chest.  The plan then was to continue the Eliquis and to reassess for high-resolution CT chest sometime in 2022.  She returns for follow-up.  She tells me that around Thanksgiving 2021 she started methotrexate 6 pills once a week on Saturdays.  Then sometime around end of December 2021 early January 2022 she started having several symptoms of nausea and headaches and feverishness and shortness of breath.  Covid was ruled out.  She admitted up getting admitted in the hospital.  CT angiogram chest ruled out pulmonary embolism.  I personally reviewed the CT scan and visualized it.  This was on October 31, 2020.  At the time of admission she was having symptoms for a few weeks.  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  The family is very concerned about this diagnosis.  She tells me however she was never treated with oxygen while in the hospital.  However she was discharged with oxygen and prednisone taper.  The prednisone taper is ending in a few days.  The methotrexate has been hold temporarily.  She has missed 1 week and dose last weekend.  The combination of the admission and the steroids and holding the methotrexate has improved her symptoms and her symptoms have resolved.  She has not been using the oxygen she was discharged  with.  Review of the records show that her pulse ox was in the low 90s on room air in the hospital.  She had a CT angiogram chest that ruled out pulmonary embolism.  No comment on ILD but it was a contrast CT scan.  High-sensitivity troponin x2 is normal.  She had asymptomatic cholelithiasis.  D-dimer was normal Covid PCR normal.   She was given a diagnosis of ILD flareup because in the emergency room she was desaturating to the low 80s with ambulation although she did not give this history at this visit.  This is based on review of records.  In the office today she did not desaturate.  The family is very concerned.  They have several questions and/concerns  -Etiology for dyspnea -this has been ongoing even since I last saw her in summer 2021 -Presence of interstitial lung disease or not -Implications of diagnosis of ILD flareup diagnosis given to them -Whether symptoms were part of methotrexate toxicity and the future role of methotrexate in treating her if she were to have ILD         PFT  PFT Results Latest Ref Rng & Units 03/11/2018  FVC-Pre L 2.06  FVC-Predicted Pre % 81  Pre FEV1/FVC % % 75  FEV1-Pre L 1.54  FEV1-Predicted Pre % 81  DLCO uncorrected ml/min/mmHg 18.80  DLCO UNC% % 82  DLVA Predicted % 102    OV 12/25/2020  Subjective:  Patient ID: [name], female , DOB: [birth year] , age 80 y.o. , MRN: [id number] , ADDRESS: [address] [place] [address] PCP [name], MD Patient Care Team: [name], MD as PCP - General Josue Hector, MD as PCP - Cardiology (Cardiology) Rosita Kea, PA-C as Physician Assistant (Rheumatology) Christain [place], [place] as Referring Physician (Optometry) Danella Sensing, MD as Consulting Physician (Dermatology) Mauro Kaufmann, RN as Oncology Nurse Navigator Rockwell [place], RN as Oncology Nurse Navigator  This Provider for this visit: Treatment Team:  Attending Provider: Brand Males, MD    12/25/2020 -    Chief Complaint  Patient presents with  . Follow-up    Pt states she has been doing okay since last visit. States she feels like her breathing may be a little worse since last visit.    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  Dr. In charge of pulmonary embolism treatment  HPI RAYMA HEGG 80 y.o. -presents for follow-up after test results.  Presents with daughter Margorie John and also daughter [name].  They both are at the bedside physically.  Overall symptoms are stable.  They are here to discuss test results.  A lot of questions about various items.  Her pulmonary function test shows stability compared to June 2019.  Her CT scan is probable UIP and also shows stability compared to June 2019.  At this point in time they want dyspnea relief.  In addition they had questions about rheumatoid arthritis.  They plan to start Rituxan because of the joint issues.  That questions about the safety of Rituxan.  Their questions about how to prevent interstitial lung disease from getting worse.  We discussed acid reflux controlled with is not an issue for her.  Discussed preventing respiratory viruses so she will continue to mask for this.  In this context he wanted to know about risk of Covid especially with her getting Rituxan.  Did express that Rituxan does not monitor IgG and therefore they are interested in checking her Covid IgG in response to her 3 doses of COVID vaccine.  Last dose of Covid vaccine was  in August 2021.  Based on this they will decide on taking monoclonal antibody prophylaxis or fourth dose Covid vaccine.  This will be done well before starting Rituxan.  She is interested in pulmonary rehabilitation.  We discussed monitoring approaches to ILD.  Discussed why antifibrotic not indicated partly because of side effect profile and also currently is  stable and non-- IPF phenotype.  SYMPTOM SCALE - ILD 11/08/2020   O2 use ra  Shortness of Breath 0 -> 5 scale with 5 being worst (score 6 If unable to do)  At rest 0  Simple tasks - showers, clothes change, eating, shaving 2  Household (dishes, doing bed, laundry) 2  Shopping 2  Walking level at own pace 4  Walking up Stairs 3  Total (30-36) Dyspnea Score 13  How bad is your cough? 3  How bad is your fatigue 5  How bad is nausea 0  How bad is vomiting?  00  How bad is diarrhea? 0  How bad is anxiety? 0  How bad is depression 0       Simple office walk 185 feet x  3 laps goal with forehead probe 02/22/2018  04/24/2020  11/08/2020   O2 used Room air ra ra  Number laps completed 3 all laps 3 2  Comments about pace Normal pace Nl pace 98% and 90/min  Resting Pulse Ox/HR 98% and 85/min 99% and 76/min 94% and HR 130  Final Pulse Ox/HR 97% and 121/min 99% and 129/minn Slow pace  Desaturated </= 88% no no   Desaturated <= 3% points no no   Got Tachycardic >/= 90/min yes yes   Symptoms at end of test Visibly Dyspneic and improved with rest. No chest pain or cough but no subjective dyspnea Denied dyspnea but visibly dyspenic to CMA   Miscellaneous comments none x      CT Chest data feb 2022  IMPRESSION: 1. Spectrum of findings compatible with basilar predominant fibrotic interstitial lung disease without frank honeycombing and without appreciable interval progression since 03/10/2018 high-resolution chest CT study. Differential includes NSIP or UIP. Findings are categorized as probable UIP per consensus guidelines: Diagnosis of Idiopathic Pulmonary Fibrosis: An Official ATS/ERS/JRS/ALAT Clinical Practice Guideline. [place], Iss 5, [id number], Jun 05 2017. 2. Scattered small bilateral pulmonary nodules are all stable since 2019, considered benign. 3. One vessel coronary atherosclerosis. 4. Mild diffuse hepatic steatosis. 5. Aortic Atherosclerosis  (ICD10-I70.0).   Electronically Signed   By: Ilona Sorrel M.D.   On: 11/26/2020 11:53     Result History     PFT  PFT Results Latest Ref Rng & Units 12/03/2020 03/11/2018  FVC-Pre L 2.00 2.06  FVC-Predicted Pre % 81 81  FVC-Post L 2.00 -  FVC-Predicted Post % 81 -  Pre FEV1/FVC % % 75 75  Post FEV1/FCV % % 77 -  FEV1-Pre L 1.50 1.54  FEV1-Predicted Pre % 82 81  FEV1-Post L 1.54 -  DLCO uncorrected ml/min/mmHg 17.08 18.80  DLCO UNC% % 94 82  DLCO corrected ml/min/mmHg 17.41 -  DLCO COR %Predicted % 96 -  DLVA Predicted % 112 102  TLC L 3.84 -  TLC % Predicted % 78 -  RV % Predicted % 77 -    Echo march 2022  IMPRESSIONS    1. Left ventricular ejection fraction, by estimation, is 60 to 65%. The  left ventricle has normal function. The left ventricle has no regional  wall motion abnormalities. Left  ventricular diastolic parameters are  consistent with Grade I diastolic  dysfunction (impaired relaxation). GLS -22.1%, normal.  2. Right ventricular systolic function is normal. The right ventricular  size is normal. Tricuspid regurgitation signal is inadequate for assessing  PA pressure.  3. The mitral valve is normal in structure. Trivial mitral valve  regurgitation. No evidence of mitral stenosis.  4. The aortic valve is tricuspid. Aortic valve regurgitation is trivial.  Mild aortic valve sclerosis is present, with no evidence of aortic valve  stenosis.  5. The inferior vena cava is normal in size with greater than 50%  respiratory variability, suggesting right atrial pressure of 3 mmHg.   has a past medical history of Cancer ([place]), Cataract, Dysrhythmia, GERD (gastroesophageal reflux disease), Hyperlipidemia, Hypertension, Migraines, Obesity, Osteoarthritis, Personal history of radiation therapy, and Phlebitis of leg, right, superficial (1986/1989).   reports that she quit smoking about 57 years ago. Her smoking use included cigarettes. She started smoking  about 68 years ago. She has a 5.00 pack-year smoking history. She has never used smokeless tobacco.  Past Surgical History:  Procedure Laterality Date  . BREAST BIOPSY Left 07/05/2019  . BREAST BIOPSY Bilateral 08/24/2019  . BREAST EXCISIONAL BIOPSY Left 09/20/2019  . BREAST LUMPECTOMY Right 09/20/2019  . BREAST LUMPECTOMY WITH RADIOACTIVE SEED LOCALIZATION Bilateral 09/20/2019   Procedure: BILATERAL BREAST LUMPECTOMIES WITH BILATERAL RADIOACTIVE SEED LOCALIZATION AND RIGHT NIPPLE BIOPSY;  Surgeon: Erroll Luna, MD;  Location: [facility];  Service: General;  Laterality: Bilateral;  . CARDIAC CATHETERIZATION  1990s   negative  . CARDIOVASCULAR STRESS TEST  2008   NML  . CARDIOVASCULAR STRESS TEST  02/02/2007   EF 70%, NO ISCHEMIA  . DILATION AND CURETTAGE, DIAGNOSTIC / THERAPEUTIC  2008  . LESION DESTRUCTION  10/2017   Face; Dr. Ronnald Ramp  . ROTATOR CUFF REPAIR  1999   Right, left in 2002  . 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 US ECHOCARDIOGRAPHY  02/07/2007   EF 55-60%    Allergies  Allergen Reactions  . Codeine Nausea And Vomiting  . Hydrocodone-Homatropine Other (See Comments)    Hallucinations     Immunization History  Administered Date(s) Administered  . Fluad Quad(high Dose 65+) 06/27/2019, 08/13/2020  . Influenza,inj,Quad PF,6+ Mos 08/05/2017, 08/04/2018  . PFIZER(Purple Top)SARS-COV-2 Vaccination 10/20/2019, 11/10/2019, 05/28/2020  . Pneumococcal Conjugate-13 07/10/2014  . Pneumococcal Polysaccharide-23 01/04/2008  . Td 10/05/1996, 01/04/2008  . Zoster 02/01/2008    Family History  Problem Relation Age of Onset  . Heart attack Father 3       PUD  . Hypertension Father   . Lung cancer Brother   . Heart defect Sister   . Hypertension Mother   . Coronary artery disease Other         Female 1st degree relative <50     Current Outpatient Medications:  .  acetaminophen (TYLENOL) 500 MG tablet, Take 1,000 mg by mouth in the morning and at bedtime., Disp: , Rfl:  .  benzonatate (TESSALON) 200 MG capsule, Take 1 capsule (200 mg total) by mouth 2 (two) times daily as needed for cough., Disp: 20 capsule, Rfl: 0 .  folic acid (FOLVITE) 1 MG tablet, Take 2 mg by mouth daily., Disp: , Rfl:  .  hydrochlorothiazide (HYDRODIURIL) 25 MG tablet, Take 0.5 tablets (12.5 mg  total) by mouth every other day., Disp: 45 tablet, Rfl: 1 .  hydroxychloroquine (PLAQUENIL) 200 MG tablet, Take 200 mg by mouth 2 (two) times daily., Disp: , Rfl:  .  OXYGEN, Inhale 2 L into the lungs at bedtime., Disp: , Rfl:  .  predniSONE (DELTASONE) 5 MG tablet, Take 5 mg by mouth daily with breakfast., Disp: , Rfl:  .  rivaroxaban (XARELTO) 20 MG TABS tablet, Take 1 tablet (20 mg total) by mouth daily with supper., Disp: 90 tablet, Rfl: 3      Objective:   Vitals:   12/25/20 1033  BP: 120/70  Pulse: 75  Temp: 97.7 F (36.5 C)  TempSrc: Temporal  SpO2: 97%  Weight: 209 lb 6.4 oz (95 kg)  Height: 5\' 4"  (1.626 m)    Estimated body mass index is 35.94 kg/m as calculated from the following:   Height as of this encounter: 5\' 4"  (1.626 m).   Weight as of this encounter: 209 lb 6.4 oz (95 kg).  @WEIGHTCHANGE @  [employer]   12/25/20 1033  Weight: 209 lb 6.4 oz (95 kg)     Physical Exam  General: No distress. obee Neuro: Alert and Oriented x 3. GCS 15. Speech normal Psych: Pleasant Resp:  Barrel Chest - no.  Wheeze - no, Crackles - no, No overt respiratory distress CVS: Normal heart sounds. Murmurs - no Ext: Stigmata of Connective Tissue Disease - no HEENT: Normal upper airway. PEERL +. No post nasal drip        Assessment:       ICD-10-CM   1. Interstitial lung disease due to connective tissue disease ([place])  J84.89    M35.9   2. Nocturnal hypoxia  G47.34   3. Vaccine counseling   Z71.85   4. Advice given about COVID-19 virus infection  Z71.89   5. 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 -Interstitial lung disease is stable between June 2019 and March 2022 -The future course of this is most likely 1 of stability at least for the next 1 or 2 years -Currently antifibrotic's are not indicated due to stability -Currently no clinical trial available for this particular situation  Plan -Continue nighttime oxygen -Referral pulmonary rehabilitation -Serial monitoring with supportive care is recommended approach at this point  -Do spirometry and DLCO in 6 months -Treatment of rheumatoid arthritis per primary care physician/rheumatologist  Shortness of breath on exertion  -This is due to interstitial lung disease/pulmonary fibrosis, diastolic dysfunction, weight issues and rheumatoid arthritis  Plan -Referral pulmonary rehabilitation  Vaccine counseling Advice given about COVID-19 virus infection Immunosuppression due to drug therapy ([place])  -You are going to start Rituxan therapy  Plan -Check Covid IgG and if this is negative we will have to consider fourth dose Covid vaccine versus monoclonal antibody prophylaxis  Follow-up -6 months do spirometry and DLCO -Return to see Dr. Chase Caller in 30 minutes slot in 6 months   (Level 04: Estb 30-39 min visit type: on-site physical face to visit visit spent in total care time and counseling or/and coordination of care by this undersigned MD - Dr Brand Males. This includes one or more of the following on this same day 12/25/2020: pre-charting, chart review, note writing, documentation discussion of test results, diagnostic or treatment  recommendations, prognosis, risks and benefits of management options,  instructions, education, compliance or risk-factor reduction. It excludes time spent by the [place] or office staff in the care of the patient . Actual time is 30 min)   SIGNATURE    Dr. Brand Males, M.D., F.C.C.P,  Pulmonary and Critical Care Medicine Staff Physician, [place] Director - Interstitial Lung Disease  Program  Pulmonary [place] [facility] [place], [place], [address]  Pager: [phone], If no answer or between  15:00h - 7:00h: call [phone] Telephone: [phone]  11:26 AM 12/25/2020

## 2020-12-25 NOTE — Addendum Note (Signed)
Addended by: Lorretta Harp on: 12/25/2020 02:00 PM   Modules accepted: Orders

## 2020-12-26 LAB — SARS-COV-2 ANTIBODY(IGG)SPIKE,SEMI-QUANTITATIVE: SARS COV1 AB(IGG)SPIKE,SEMI QN: 57.85 index — ABNORMAL HIGH (ref <1.00)

## 2020-12-29 ENCOUNTER — Telehealth: Payer: Self-pay | Admitting: Internal Medicine

## 2020-12-29 NOTE — Telephone Encounter (Signed)
  She still has good Covid IgG levels after 3rd shot covid vacine in Aug 2021. Th is is 7 months out. DAta on 4th shot still evolving but as of today CDC is recommending 4th shot some > 3 months after 3rd shot in immune compromised people  [place] for rituxan  - but given fact she is going to get rituxan, probably good idea to get 4th shot some 2-3 weeks before rituxan -> if this cannot happen then after rituxan will arrange for [facility]   Results for SCARLETTROSE, COSTILOW" (MRN [id number]) as of 12/29/2020 11:50  Ref. Range 12/25/2020 11:33  SARS COV1 AB(IGG)SPIKE,SEMI QN Latest Ref Range: <1.00 index 57.85 (H)     Immunization History  Administered Date(s) Administered  . Fluad Quad(high Dose 65+) 06/27/2019, 08/13/2020  . Influenza,inj,Quad PF,6+ Mos 08/05/2017, 08/04/2018  . PFIZER(Purple Top)SARS-COV-2 Vaccination 10/20/2019, 11/10/2019, 05/28/2020  . Pneumococcal Conjugate-13 07/10/2014  . Pneumococcal Polysaccharide-23 01/04/2008  . Td 10/05/1996, 01/04/2008  . Zoster 02/01/2008

## 2020-12-30 ENCOUNTER — Encounter (HOSPITAL_COMMUNITY): Payer: Self-pay | Admitting: *Deleted

## 2020-12-30 NOTE — Progress Notes (Signed)
Received referral from Dr. Chase Caller for this pt to participate in pulmonary rehab with the the diagnosis of ILD. Clinical review of pt follow up appt on 3/23 Pulmonary office note. Also reviewed pt office note with PCP as well as January admission  Pt with Covid Risk Score - 6. Pt appropriate for scheduling for Pulmonary rehab.  Will forward to support staff for scheduling and verification of insurance eligibility/benefits with pt consent. Cherre Huger, BSN Cardiac and Training and development officer

## 2021-01-01 DIAGNOSIS — E876 Hypokalemia: Secondary | ICD-10-CM | POA: Insufficient documentation

## 2021-01-01 DIAGNOSIS — D649 Anemia, unspecified: Secondary | ICD-10-CM | POA: Insufficient documentation

## 2021-01-01 NOTE — Assessment & Plan Note (Signed)
Due for lab re-eval.

## 2021-01-01 NOTE — Telephone Encounter (Signed)
Attempted to call pt but unable to reach. Unable to leave VM due to no machine ever kicking in. Will try to call back later. 

## 2021-01-01 NOTE — Assessment & Plan Note (Signed)
Followed by pulmonary 

## 2021-01-01 NOTE — Assessment & Plan Note (Signed)
Re-eval for stability, check iron levels as well.

## 2021-01-01 NOTE — Assessment & Plan Note (Signed)
Continued oxygen requirement.

## 2021-01-02 NOTE — Telephone Encounter (Signed)
Called and spoke with patient. She is aware of MR recs.   Nothing further needed at time of call.

## 2021-02-12 ENCOUNTER — Telehealth (HOSPITAL_COMMUNITY): Payer: Self-pay

## 2021-02-12 ENCOUNTER — Encounter (HOSPITAL_COMMUNITY): Payer: Self-pay

## 2021-02-12 NOTE — Telephone Encounter (Signed)
Pt insurance is active and benefits verified through [facility] Medicare. Co-pay $0.00, DED $290.00/$290.00 met, out of pocket $3,190.00/$532.37 met, co-insurance 20%. No pre-authorization required. Kere/[employer] Medicare, 02/12/21 @ 11:50AM, OVZ#[id number]  Will contact patient to see if she is interested in the Pulmonary Rehab Program.

## 2021-02-12 NOTE — Telephone Encounter (Signed)
Attempted to call patient in regards to Pulmonary Rehab - LM on VM Mailed letter 

## 2021-03-05 ENCOUNTER — Telehealth (HOSPITAL_COMMUNITY): Payer: Self-pay

## 2021-03-05 NOTE — Telephone Encounter (Signed)
No response from pt.  Closed referral  

## 2021-03-19 ENCOUNTER — Telehealth: Payer: Self-pay | Admitting: *Deleted

## 2021-03-19 NOTE — Telephone Encounter (Signed)
Patient called stating that she has gotten tied of taking the diuretic because it is causing her to go to the bathroom way too much. Patient stated that she was  on another medication years ago that worked well for her until she developed a cough. Patient stated that she thinks that it was Enalapril. Patient stated that she would like to go back on the Enalapril or something different than the diuretic that she is currently on. Patient stated that she has talked with Dr. Diona Browner about this previously. [place]

## 2021-03-20 NOTE — Telephone Encounter (Signed)
Spoke with Mrs. Conley Canal.  She is still taking the HCTZ every other day.  She is agreeable with starting the Losartan.  [place]

## 2021-03-21 NOTE — Telephone Encounter (Signed)
Spoke with Mr. Stillion.  Mrs. Mischke was unavailable.  I ask him to let her know I need some of her recent blood pressure readings so Dr. Diona Browner can decide what dose to prescribe on her new BP medication.  I told him she can also send [place] the readings on MyChart if that would be easier.  He didn't think patient would be home before 5:00 pm today so he said it would probably be Monday before we hear back from her.

## 2021-03-31 ENCOUNTER — Other Ambulatory Visit: Payer: Self-pay | Admitting: Physician Assistant

## 2021-03-31 DIAGNOSIS — Z79899 Other long term (current) drug therapy: Secondary | ICD-10-CM

## 2021-04-01 NOTE — Telephone Encounter (Signed)
Noted  

## 2021-04-01 NOTE — Telephone Encounter (Signed)
Followed up with Mrs. [name] about BP reading.  She states she has been so busy and has been celebrating her birthday that she has not been checking her blood pressures at home. She has that on her list to do and wants to replace the batteries in her BP monitor too.  She still is taking the HCTZ qod.  She stated that she did go see her RA doctor and they told her that her BP was good at that visit.  She will call us back with readings.  FYI to Dr. Diona Browner.

## 2021-04-17 ENCOUNTER — Telehealth: Payer: Self-pay | Admitting: Internal Medicine

## 2021-04-17 NOTE — Telephone Encounter (Signed)
Pt is calling to determine the name of the DME that she received her oxygen from because she is unable to locate the information in regards to billing.  Pls regard; [phone]

## 2021-04-17 NOTE — Telephone Encounter (Signed)
Spoke with pt  She is needing DME number for billing question  Provided her with [employer]'s phone number  Nothing further needed

## 2021-05-06 ENCOUNTER — Other Ambulatory Visit: Payer: Medicare Other

## 2021-05-20 ENCOUNTER — Encounter: Payer: Self-pay | Admitting: Family Medicine

## 2021-05-20 ENCOUNTER — Other Ambulatory Visit: Payer: Self-pay

## 2021-05-20 ENCOUNTER — Ambulatory Visit: Payer: Medicare Other | Admitting: Family Medicine

## 2021-05-20 VITALS — BP 130/60 | HR 91 | Temp 98.3°F | Ht 64.0 in | Wt 217.0 lb

## 2021-05-20 DIAGNOSIS — R300 Dysuria: Secondary | ICD-10-CM

## 2021-05-20 DIAGNOSIS — N898 Other specified noninflammatory disorders of vagina: Secondary | ICD-10-CM | POA: Diagnosis not present

## 2021-05-20 DIAGNOSIS — N3281 Overactive bladder: Secondary | ICD-10-CM | POA: Diagnosis not present

## 2021-05-20 LAB — POC URINALSYSI DIPSTICK (AUTOMATED)
Bilirubin, UA: NEGATIVE
Blood, UA: NEGATIVE
Glucose, UA: NEGATIVE
Ketones, UA: NEGATIVE
Leukocytes, UA: NEGATIVE
Nitrite, UA: NEGATIVE
Protein, UA: POSITIVE — AB
Spec Grav, UA: >=1.03 — AB (ref 1.010–1.025)
Urobilinogen, UA: 0.2 E.U./dL
pH, UA: 5.5 (ref 5.0–8.0)

## 2021-05-20 MED ORDER — TOLTERODINE TARTRATE ER 2 MG PO CP24: 2.0000 mg | ORAL_CAPSULE | Freq: Every day | ORAL | 11 refills | Status: DC

## 2021-05-20 NOTE — Progress Notes (Signed)
Patient ID: [name], female    DOB: 04-20-1941, 80 y.o.   MRN: [id number]  This visit was conducted in person.  BP 130/60   Pulse 91   Temp 98.3 F (36.8 C) (Temporal)   Ht '5\' 4"'$  (1.626 m)   Wt 217 lb (98.4 kg)   SpO2 95%   BMI 37.25 kg/m    CC:.  Chief Complaint  Patient presents with  . Vaginal Irritation  . Burning with Urination     Subjective:   HPI: [name] is a postmenopausal 80 y.o. female presenting on 05/20/2021 for Vaginal Irritation and Burning with Urination  UA clear, just trace of protein.Marland Kitchen will send for culture given neg UA in setting of UTI for this pt in past.   She reports intermittent vaginal itching, irritation for last several years.  No vaginal discharge or odor.. more dryness.   She feels burning with urination  x 1 month when hits outside of vagina. No fever.  Some increase  frequency, always has urgency and incontinence.  No lower abdominal pain.   Mometasone in past has been helpful.. given by derm in apst.. but notw irritating.   Hx of ER positive     Using wipes   Wearing pads for leaking, incontinence.  Relevant past medical, surgical, family and social history reviewed and updated as indicated. Interim medical history since our last visit reviewed. Allergies and medications reviewed and updated. Outpatient Medications Prior to Visit  Medication Sig Dispense Refill  . acetaminophen (TYLENOL) 500 MG tablet Take 1,000 mg by mouth in the morning and at bedtime.    . folic acid (FOLVITE) 1 MG tablet Take 2 mg by mouth daily.    . hydrochlorothiazide (HYDRODIURIL) 25 MG tablet Take 0.5 tablets (12.5 mg total) by mouth every other day. 45 tablet 1  . hydroxychloroquine (PLAQUENIL) 200 MG tablet Take 200 mg by mouth 2 (two) times daily.    . OXYGEN Inhale 2 L into the lungs at bedtime.    . predniSONE (DELTASONE) 5 MG tablet Take 5 mg by mouth daily with breakfast.    . rivaroxaban (XARELTO) 20 MG TABS tablet  Take 1 tablet (20 mg total) by mouth daily with supper. 90 tablet 3  . benzonatate (TESSALON) 200 MG capsule Take 1 capsule (200 mg total) by mouth 2 (two) times daily as needed for cough. 20 capsule 0   No facility-administered medications prior to visit.     Per HPI unless specifically indicated in ROS section below Review of Systems  Constitutional:  Negative for fatigue and fever.  HENT:  Negative for ear pain.   Eyes:  Negative for pain.  Respiratory:  Negative for chest tightness and shortness of breath.   Cardiovascular:  Negative for chest pain, palpitations and leg swelling.  Gastrointestinal:  Negative for abdominal pain.  Genitourinary:  Negative for dysuria.  Objective:  BP 130/60   Pulse 91   Temp 98.3 F (36.8 C) (Temporal)   Ht '5\' 4"'$  (1.626 m)   Wt 217 lb (98.4 kg)   SpO2 95%   BMI 37.25 kg/m   Wt Readings from Last 3 Encounters:  05/20/21 217 lb (98.4 kg)  12/25/20 209 lb 6.4 oz (95 kg)  11/08/20 205 lb (93 kg)      Physical Exam Exam conducted with a chaperone present.  Constitutional:      General: She is not in acute distress.    Appearance: Normal appearance. She is well-developed.  She is not ill-appearing or toxic-appearing.  HENT:     Head: Normocephalic.     Right Ear: Hearing, tympanic membrane, ear canal and external ear normal. Tympanic membrane is not erythematous, retracted or bulging.     Left Ear: Hearing, tympanic membrane, ear canal and external ear normal. Tympanic membrane is not erythematous, retracted or bulging.     Nose: No mucosal edema or rhinorrhea.     Right Sinus: No maxillary sinus tenderness or frontal sinus tenderness.     Left Sinus: No maxillary sinus tenderness or frontal sinus tenderness.     Mouth/Throat:     Pharynx: Uvula midline.  Eyes:     General: Lids are normal. Lids are everted, no foreign bodies appreciated.     Conjunctiva/sclera: Conjunctivae normal.     Pupils: Pupils are equal, round, and reactive to  light.  Neck:     Thyroid: No thyroid mass or thyromegaly.     Vascular: No carotid bruit.     Trachea: Trachea normal.  Cardiovascular:     Rate and Rhythm: Normal rate and regular rhythm.     Pulses: Normal pulses.     Heart sounds: Normal heart sounds, S1 normal and S2 normal. No murmur heard.   No friction rub. No gallop.  Pulmonary:     Effort: Pulmonary effort is normal. No tachypnea or respiratory distress.     Breath sounds: Normal breath sounds. No decreased breath sounds, wheezing, rhonchi or rales.  Abdominal:     General: Bowel sounds are normal.     Palpations: Abdomen is soft.     Tenderness: There is no abdominal tenderness.  Genitourinary:    Exam position: Supine.     Comments: Atrophic vaginitis changes, sry vaginal mucosa Musculoskeletal:     Cervical back: Normal range of motion and neck supple.  Skin:    General: Skin is warm and dry.     Findings: No rash.  Neurological:     Mental Status: She is alert.  Psychiatric:        Mood and Affect: Mood is not anxious or depressed.        Speech: Speech normal.        Behavior: Behavior normal. Behavior is cooperative.        Thought Content: Thought content normal.        Judgment: Judgment normal.      Results for orders placed or performed in visit on 12/25/20  SARS-CoV-2 Antibody(IgG)Spike,Semi-Quantitative  Result Value Ref Range   SARS COV1 AB(IGG)SPIKE,SEMI QN 57.85 (H) <1.00 index    This visit occurred during the SARS-CoV-2 public health emergency.  Safety protocols were in place, including screening questions prior to the visit, additional usage of staff PPE, and extensive cleaning of exam room while observing appropriate contact time as indicated for disinfecting solutions.   COVID 19 screen:  No recent travel or known exposure to COVID19 The patient denies respiratory symptoms of COVID 19 at this time. The importance of social distancing was discussed today.   Assessment and Plan Problem List  Items Addressed This Visit     Burning with urination - Primary    Send urine for culture.      Relevant Orders   POCT Urinalysis Dipstick (Automated) (Completed)   Urine Culture (Completed)   OAB (overactive bladder)    Trial of Detrol LA.      Vaginal irritation    Apply  Desitin as a barrier cream.  Estrogen cream is contraindicated  in ER positive breast cancer.          Eliezer Lofts, MD

## 2021-05-20 NOTE — Patient Instructions (Addendum)
We will call you with urine culture results. Apply  Desitin as a barrier cream.  Estrogen cream is contraindicated in ER positive breast cancer. Start Detrol LA low dose .Marland Kitchen call in 1 month for update on how working.. may need to increase to 4 mg.

## 2021-05-21 LAB — URINE CULTURE
MICRO NUMBER:: 12249408
SPECIMEN QUALITY:: ADEQUATE

## 2021-06-05 ENCOUNTER — Other Ambulatory Visit: Payer: Self-pay

## 2021-06-05 ENCOUNTER — Ambulatory Visit: Payer: Medicare Other | Admitting: Internal Medicine

## 2021-06-05 ENCOUNTER — Encounter: Payer: Self-pay | Admitting: Internal Medicine

## 2021-06-05 ENCOUNTER — Telehealth: Payer: Self-pay

## 2021-06-05 VITALS — BP 148/76 | HR 84 | Temp 98.2°F | Ht 64.0 in | Wt 217.0 lb

## 2021-06-05 DIAGNOSIS — R309 Painful micturition, unspecified: Secondary | ICD-10-CM | POA: Diagnosis not present

## 2021-06-05 DIAGNOSIS — N3000 Acute cystitis without hematuria: Secondary | ICD-10-CM

## 2021-06-05 DIAGNOSIS — N3001 Acute cystitis with hematuria: Secondary | ICD-10-CM | POA: Insufficient documentation

## 2021-06-05 MED ORDER — SULFAMETHOXAZOLE-TRIMETHOPRIM 800-160 MG PO TABS: 1.0000 | ORAL_TABLET | Freq: Two times a day (BID) | ORAL | 1 refills | Status: DC

## 2021-06-05 NOTE — Assessment & Plan Note (Signed)
Unable to give urine (missed toilet and was on the floor). Can't go again Classic symptoms of bladder infection---not sick Will give bactrim for 3 days Discussed my concern that the tolteroldine could be causing urinary retention (if she has any recurrent symptoms, she should absolutely stop it)

## 2021-06-05 NOTE — Telephone Encounter (Addendum)
[name] notified by telephone that it is okay for her to take her Detrol this morning per Dr. Diona Browner.

## 2021-06-05 NOTE — Patient Instructions (Signed)
Please start the antibiotic today and take one now and again at bedtime--then 2 a day for another 2 days. If your symptoms recur, you can refill the medication (but be sure to stop the tolterodine that could be causing urinary retention and predisposing to an infection).  The other medication you had been prescribed before was myrbetriq

## 2021-06-05 NOTE — Progress Notes (Signed)
Subjective:    Patient ID: [name], female    DOB: 04-11-1941, 80 y.o.   MRN: [id number]  HPI Here due to urinary symptoms This visit occurred during the SARS-CoV-2 public health emergency.  Safety protocols were in place, including screening questions prior to the visit, additional usage of staff PPE, and extensive cleaning of exam room while observing appropriate contact time as indicated for disinfecting solutions.   Has had urinary symptoms for about 5 years Leakage----mixed incontinence Did get Rx for another med--too much money Started tolterodine 1 week ago---really helped the incontinence Also with genital rash due to irritation (of being wet)--A&D and less incontinence helped  Then noticed pain yesterday--dysuria Also with urgency No hematuria  Current Outpatient Medications on File Prior to Visit  Medication Sig Dispense Refill   acetaminophen (TYLENOL) 500 MG tablet Take 1,000 mg by mouth in the morning and at bedtime.     folic acid (FOLVITE) 1 MG tablet Take 2 mg by mouth daily.     hydrochlorothiazide (HYDRODIURIL) 25 MG tablet Take 0.5 tablets (12.5 mg total) by mouth every other day. 45 tablet 1   hydroxychloroquine (PLAQUENIL) 200 MG tablet Take 200 mg by mouth 2 (two) times daily.     OXYGEN Inhale 2 L into the lungs at bedtime.     predniSONE (DELTASONE) 5 MG tablet Take 5 mg by mouth daily with breakfast.     rivaroxaban (XARELTO) 20 MG TABS tablet Take 1 tablet (20 mg total) by mouth daily with supper. 90 tablet 3   tolterodine (DETROL LA) 2 MG 24 hr capsule Take 1 capsule (2 mg total) by mouth daily. 30 capsule 11   No current facility-administered medications on file prior to visit.    Allergies  Allergen Reactions   Codeine Nausea And Vomiting   Hydrocodone Bit-Homatrop Mbr Other (See Comments)    Hallucinations     Past Medical History:  Diagnosis Date   Cancer [facility])    breast   Cataract    diagnosed by Dr. Phineas Douglas   Dysrhythmia     PSVT/ palpitations-  controlled with prn Inderal   GERD (gastroesophageal reflux disease)    30 years ago.    Hyperlipidemia    statin intolerant   Hypertension    eccho 4/13 EPIC   Migraines    Obesity    Osteoarthritis    Personal history of radiation therapy    right breast   Phlebitis of leg, right, superficial 1986/1989   x 2    Past Surgical History:  Procedure Laterality Date   BREAST BIOPSY Left 07/05/2019   BREAST BIOPSY Bilateral 08/24/2019   BREAST EXCISIONAL BIOPSY Left 09/20/2019   BREAST LUMPECTOMY Right 09/20/2019   BREAST LUMPECTOMY WITH RADIOACTIVE SEED LOCALIZATION Bilateral 09/20/2019   Procedure: BILATERAL BREAST LUMPECTOMIES WITH BILATERAL RADIOACTIVE SEED LOCALIZATION AND RIGHT NIPPLE BIOPSY;  Surgeon: Erroll Luna, MD;  Location: [facility];  Service: General;  Laterality: Bilateral;   CARDIAC CATHETERIZATION  1990s   negative   CARDIOVASCULAR STRESS TEST  2008   NML   CARDIOVASCULAR STRESS TEST  02/02/2007   EF 70%, NO ISCHEMIA   DILATION AND CURETTAGE, DIAGNOSTIC / THERAPEUTIC  2008   LESION DESTRUCTION  10/2017   Face; Dr. Ronnald Ramp   ROTATOR CUFF REPAIR  1999   Right, left in 2002   TOTAL KNEE ARTHROPLASTY  2008   Left   TOTAL KNEE ARTHROPLASTY  10/11/2012   Procedure: TOTAL KNEE ARTHROPLASTY;  Surgeon: Rodman Key  Marian Sorrow, MD;  Location: [facility];  Service: Orthopedics;  Laterality: Right;   US ECHOCARDIOGRAPHY  02/07/2007   EF 55-60%    Family History  Problem Relation Age of Onset   Heart attack Father 8       PUD   Hypertension Father    Lung cancer Brother    Heart defect Sister    Hypertension Mother    Coronary artery disease Other        Female 1st degree relative <50    Social History   Socioeconomic History   Marital status: Married    Spouse name: Not on file   Number of children: Not on file   Years of education: Not on file   Highest education level: Not on file  Occupational History   Occupation: Retired   Tobacco Use   Smoking status: Former    Packs/day: 0.50    Years: 10.00    Pack years: 5.00    Types: Cigarettes    Start date: 74    Quit date: 10/06/1963    Years since quitting: 57.7   Smokeless tobacco: Never  Vaping Use   Vaping Use: Never used  Substance and Sexual Activity   Alcohol use: No    Comment: stopped 5 years ago   Drug use: No   Sexual activity: Yes  Other Topics Concern   Not on file  Social History Narrative   Regular exercise: yes walks 1 mile a day   Diet: loves butter, fruit and veggies   Social Determinants of Health   Financial Resource Strain: Not on file  Food Insecurity: Not on file  Transportation Needs: Not on file  Physical Activity: Not on file  Stress: Not on file  Social Connections: Not on file  Intimate Partner Violence: Not on file   Review of Systems Last night--had pain in hands at same time as dysuria No N/V No fever    Objective:   Physical Exam Constitutional:      Appearance: Normal appearance.  Abdominal:     Palpations: Abdomen is soft.     Tenderness: There is no abdominal tenderness. There is no right CVA tenderness or left CVA tenderness.     Comments: Seemed to have suprapubic dullness  Neurological:     Mental Status: She is alert.           Assessment & Plan:

## 2021-06-05 NOTE — Telephone Encounter (Signed)
Vm from pt stating she has OV today at 3:15 with Dr. Silvio Pate for possible UTI.  She's asking if she should take her Detrol this morning or not.  Plz advise.

## 2021-06-19 ENCOUNTER — Telehealth: Payer: Self-pay | Admitting: Family Medicine

## 2021-06-19 NOTE — Telephone Encounter (Signed)
Pt called she would like a call back to discuss the bladder medication she is on. She is wanting to increase

## 2021-06-19 NOTE — Telephone Encounter (Signed)
Spoke with Mrs. [name].  She states she feels the tolterodine 2 mg is working but she would like to try a higher does.  She states she got a UTI shortly after starting the detrol and the doctor she saw told her he felt like the detrol doesn't let you empty your bladder completely and they may caused the UTI but Mrs. [name] states Dr. Diona Browner never mentioned that and in looking up things on the Internet, she can't find anything that says that.  Please advise about increasing dose.

## 2021-06-20 ENCOUNTER — Other Ambulatory Visit: Payer: Self-pay | Admitting: Family Medicine

## 2021-06-20 MED ORDER — TOLTERODINE TARTRATE ER 4 MG PO CP24: 4.0000 mg | ORAL_CAPSULE | Freq: Every day | ORAL | 11 refills | Status: DC

## 2021-06-20 NOTE — Progress Notes (Signed)
Higher dose detrol LA sent in to pharmacy. Make sure pt on look out for SE of constipation and oversedation

## 2021-06-20 NOTE — Telephone Encounter (Signed)
[name] notified as instructed by telephone.  She denies any sedation, constipation or dry mouth  at the lower dose.

## 2021-06-20 NOTE — Progress Notes (Signed)
Mrs. Mew notified as instructed by telephone.  States understanding.

## 2021-06-23 ENCOUNTER — Ambulatory Visit (INDEPENDENT_AMBULATORY_CARE_PROVIDER_SITE_OTHER): Payer: Medicare Other | Admitting: Internal Medicine

## 2021-06-23 ENCOUNTER — Other Ambulatory Visit: Payer: Self-pay

## 2021-06-23 ENCOUNTER — Encounter: Payer: Self-pay | Admitting: Internal Medicine

## 2021-06-23 VITALS — BP 138/70 | HR 81 | Temp 98.0°F | Ht 63.0 in | Wt 219.0 lb

## 2021-06-23 DIAGNOSIS — G4734 Idiopathic sleep related nonobstructive alveolar hypoventilation: Secondary | ICD-10-CM

## 2021-06-23 DIAGNOSIS — J8489 Other specified interstitial pulmonary diseases: Secondary | ICD-10-CM

## 2021-06-23 DIAGNOSIS — M359 Systemic involvement of connective tissue, unspecified: Secondary | ICD-10-CM

## 2021-06-23 DIAGNOSIS — Z7185 Encounter for immunization safety counseling: Secondary | ICD-10-CM

## 2021-06-23 LAB — PULMONARY FUNCTION TEST
DL/VA % pred: 110 %
DL/VA: 4.54 ml/min/mmHg/L
DLCO cor % pred: 99 %
DLCO cor: 18.01 ml/min/mmHg
DLCO unc % pred: 99 %
DLCO unc: 18.01 ml/min/mmHg
FEF 25-75 Pre: 1.15 L/sec
FEF2575-%Pred-Pre: 91 %
FEV1-%Pred-Pre: 84 %
FEV1-Pre: 1.51 L
FEV1FVC-%Pred-Pre: 102 %
FEV6-%Pred-Pre: 88 %
FEV6-Pre: 1.99 L
FEV6FVC-%Pred-Pre: 106 %
FVC-%Pred-Pre: 83 %
FVC-Pre: 1.99 L
Pre FEV1/FVC ratio: 76 %
Pre FEV6/FVC Ratio: 100 %

## 2021-06-23 NOTE — Progress Notes (Signed)
Spirometry and Dlco done today. 

## 2021-06-23 NOTE — Progress Notes (Signed)
HPI  PCP Rachel Sanders, MD  HPI  IOV 02/22/2018   [name] presents on behalf of [name] and his team for evaluation of shortness of breath and concern for possible interstitial lung disease seen on chest x-ray.  History is given by the patient and review of the chart including old medical records and visualization of the films mentioned.  According to the patient she was diagnosed with rheumatoid arthritis not otherwise specified approximately 3 years ago.  For the first year she was on methotrexate which caused weight gain and itchiness and this was stopped.  Subsequently was on leflunomide which caused diarrhea and 16 pound weight loss and therefore had to be stopped in 2018.  Early in 2019 she was started on Enbrel which she believes is causing weight gain although when I asked her if her weight gain was just a reflection of her not being on [place] she conceded that it might not be the Enbrel directly causing the weight gain.  Nevertheless she has had weight gain with a significant and documented below.  Associated with his weight gain has been insidious onset of shortness of breath with exertion relieved by rest.  She notices shortness of breath when walking up an incline compared to her peers and she has to stop to rest.  There is no associated chest pain or proximal nocturnal dyspnea or orthopnea wheezing or cough or edema.  She says the most of the time she does not subjectively feel the dyspnea but then she is noticed to be visibly dyspneic by the family or onlookers.  Such was the case when she walked in our office today when she got tachycardic without any desaturation.  American College of chest physicians interstitial lung disease questionnaire  Symptoms: She has occasional cough but not bothersome.  Does not cough at night does not wake her up.  She walks slower than people of her age.  Started 6 months ago  Past medical history: She says she has a "fat heart"  although 2013 echocardiogram that I reviewed was normal.  She denies any weight loss or dysphagia or heartburn or acid reflux or dry eyes or chest pain or ongoing arthralgia  Personal exposure history: She never smoked any recreational drugs.  She started smoking at age 40 smoked half a pack a day and quit when she was 89.  Family history of lung disease: This emphysema in her brother but no family history of pulmonary fibrosis  Home environment history: He does not old house.  There is no humidifier or insomnia or hot tub or Jacuzzi or water damage.  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  Simple office walk 185 feet x  3 laps goal with forehead probe 02/22/2018   O2 used Room air  Number laps completed 3 all laps  Comments about pace Normal pace  Resting Pulse Ox/HR 98% and 85/min  Final Pulse Ox/HR 97% and 121/min  Desaturated </= 88% no  Desaturated <= 3% points no  Got Tachycardic >/= 90/min yes  Symptoms at end of test Visibly Dyspneic and improved with rest. No chest pain or  cough but no subjective dyspnea  Miscellaneous comments none     OV 03/15/2018  Chief Complaint  Patient presents with   Follow-up    Echo and HRCT performed 6/6 and PFT performed 6/7.  Pt states she has been doing well since last visit and denies any complaints.   [name] returns for follow-up after doing work-up for shortness of breath in the setting of rheumatoid arthritis with a specific question of ruling out interstitial lung disease   She returns with her husband as before.  This time she is accompanied by her  daughter who lives in [place], [place].  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  This back pain preceded the diagnosis of rheumatoid arthritis.  It is relieved by rest.  Her work-up shows normal pulmonary function test including DLCO but the radiologist did call for high-resolution CT chest findings of indeterminate for UIP interstitial lung disease.  There is also an additional 4 mm right upper lobe nodule.  Her echocardiogram itself is normal.  There is an additional finding of coronary artery calcification on the CT chest.  We discussed all this in detail.    IMPRESSION: HRCT 1. Findings do suggest interstitial lung disease, but at this time, the CT pattern is considered indeterminate for usual interstitial pneumonia (UIP). Repeat high-resolution chest CT is suggested in 12 months to assess for temporal changes in the appearance of the lung parenchyma. 2. 4 mm right upper lobe pulmonary nodule. This is nonspecific but statistically likely benign. Attention at time of repeat high-resolution chest CT is recommended. 3. Aortic atherosclerosis, in addition to left main and left anterior descending coronary artery disease. Assessment for potential risk factor modification, dietary therapy or pharmacologic therapy may be warranted, if clinically indicated. 4. Hepatic steatosis.   Aortic Atherosclerosis (ICD10-I70.0).     Electronically Signed   By: [name] M.D.   On: 03/10/2018 12:02  PFT   Results for [name] (MRN [id number]) as of 03/15/2018 10:03  Ref. Range 03/11/2018 11:38  FVC-Pre Latest Units: L 2.06  FVC-%Pred-Pre Latest Units: % 81  FEV1-Pre Latest Units: L 1.54  FEV1-%Pred-Pre Latest Units: % 81  Pre FEV1/FVC ratio Latest Units: % 75  FEV1FVC-%Pred-Pre Latest Units: % 100  FEF 25-75  Pre Latest Units: L/sec 1.11  FEF2575-%Pred-Pre Latest Units: % 79  FEV6-Pre Latest Units: L 2.06  FEV6-%Pred-Pre Latest Units: % 86  Pre FEV6/FVC Ratio Latest Units: % 100  FEV6FVC-%Pred-Pre Latest Units: % 106  DLCO unc Latest Units: ml/min/mmHg 18.80  DLCO unc % pred Latest Units: % 82  DL/VA Latest Units: ml/min/mmHg/L 4.78  DL/VA % pred Latest Units: % 102     Study Conclusions - ECHO 03/10/18   - Left ventricle: The cavity size was normal. Systolic function was   normal. The estimated ejection fraction was in the range of 60%   to 65%. Wall motion was normal; there were no regional wall   motion abnormalities. Doppler parameters are consistent with   abnormal left ventricular relaxation (grade 1 diastolic   dysfunction). Doppler parameters are consistent with elevated   ventricular end-diastolic filling pressure. - Aortic valve: There was no regurgitation. - Mitral valve: Calcified annulus. Mildly thickened leaflets .   There was mild regurgitation. - Right ventricle: The cavity size was normal. Wall thickness  was   normal. Systolic function was normal. - Pulmonary arteries: Systolic pressure was within the normal   range. - Inferior vena cava: The vessel was normal in size. - Pericardium, extracardiac: There was no pericardial effusion.    has a past medical history of Cataract, Dysrhythmia, GERD (gastroesophageal reflux disease), Hyperlipidemia, Hypertension, Migraines, Obesity, Osteoarthritis, and Phlebitis of leg, right, superficial (1986/1989).   reports that she quit smoking about 40 years ago.      OV 04/24/2020  Subjective:  Patient ID: [name], female , DOB: 18-Oct-1940 , age 51 y.o. , MRN: [id number] , ADDRESS: [address] [place] [facility] [id number]   04/24/2020 -   Chief Complaint  Patient presents with   Follow-up    follow up from recent hospitalization for pulmonary embolism     HPI ASHVI PLAISANCE 80 y.o. -presents with her daughter  for follow-up.  Not seen her since 2019.  Was supposed to repeat a high-resolution CT chest in case she has ILD or not.  The end of June 2021 after a trip to the mountains and also recently being sedentary she developed mild pulmonary embolism and was admitted.  She was admitted just for 1 day.  She was discharged on night oxygen which is not using.  She is on Eliquis which she is taking.  She says she is improved but she still has residual fatigue and dyspnea on exertion and some chest tightness.  No bleeding episodes.  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  Dr. Lindi Adie her oncologist has done some hypercoagulable work-up according to history.  Of note her CT chest did not show any evidence of right heart strain.  Shows right upper lobe nodule which is reported as new but the old CT also reports right upper lobe nodule.     Simple office walk 185 feet x  3 laps goal with forehead probe 02/22/2018  04/24/2020   O2 used Room air ra  Number laps completed 3 all laps 3  Comments about pace Normal pace Nl pace  Resting Pulse Ox/HR 98% and 85/min 99% and 76/min  Final Pulse Ox/HR 97% and 121/min 99% and 129/minn  Desaturated </= 88% no no  Desaturated <= 3% points no no  Got Tachycardic >/= 90/min yes yes  Symptoms at end of test Visibly Dyspneic and improved with rest. No chest pain or cough but no subjective dyspnea Denied dyspnea but visibly dyspenic to CMA  Miscellaneous comments none x    ECHO 04/01/20  Sonographer Comments: Image acquisition challenging due to patient body  habitus.  IMPRESSIONS    1. Left ventricular ejection fraction, by estimation, is 55 to 60%. The  left ventricle has normal function. The left ventricle has no regional  wall motion abnormalities. Left ventricular diastolic parameters are  consistent with Grade I diastolic  dysfunction (impaired relaxation).   2. Right ventricular systolic function is normal. The right ventricular  size is normal. There is mildly  elevated pulmonary artery systolic  pressure.   3. The mitral valve is normal in structure. Mild mitral valve  regurgitation.   4. The aortic valve is normal in structure. Aortic valve regurgitation is  trivial.   Comparison(s): The left ventricular function is unchanged.   ROS - per HPI  IMPRESSION: ct angio June 2021 The examination is positive for pulmonary embolus without evidence of right heart strain.   0.4 cm right upper lobe pulmonary nodule is new since the prior CT. No follow-up needed if  patient is low-risk. Non-contrast chest CT can be considered in 12 months if patient is high-risk. This recommendation follows the consensus statement: Guidelines for Management of Incidental Pulmonary Nodules Detected on CT Images: From the Fleischner Society 2017; Radiology 2017; 284:228-243.   Fatty infiltration of the liver.   Gallstone without evidence of cholecystitis.   Aortic Atherosclerosis (ICD10-I70.0).   Critical Value/emergent results were called by telephone at the time of interpretation on 04/01/2020 at 2:00 pm to provider Rachel Daily, RN, who verbally acknowledged these results.   OV 11/08/2020  Subjective:  Patient ID: [name], female , DOB: 1941/01/07 , age 12 y.o. , MRN: [id number] , ADDRESS: [address] [place] [address] PCP Rachel Sanders, MD Patient Care Team: Rachel Sanders, MD as PCP - General Josue Hector, MD as PCP - Cardiology (Cardiology) Rosita Kea, PA-C as Physician Assistant (Rheumatology) Christain [place], [place] as Referring Physician (Optometry) Danella Sensing, MD as Consulting Physician (Dermatology) Mauro Kaufmann, RN as Oncology Nurse Navigator Rockwell [place], RN as Oncology Nurse Navigator  This Provider for this visit: Treatment Team:  Attending Provider: Brand Males, MD    11/08/2020 -   Chief Complaint  Patient presents with   Follow-up    Got out of the hospital on 1/27, thought she may have blood  clots. SOB has gotten worse with exertion   Follow-up dyspnea with suspected ILD in the setting of rheumatoid arthritis, obesity and grade 1 diastolic dysfunction in June 2021 echo and history of pulmonary embolism June 2021   HPI [name] 80 y.o. -returns for follow-up.  Last seen in the summer 2021.  She is accompanied by her daughter.  The other daughter and is on the speaker phone.  There is concern for interstitial lung disease based on 2019 CT chest in the history of rheumatoid arthritis.  But then because of the pandemic she could not follow-up.  Then in the summer 2021 when she followed up she had pulmonary embolism and had a CT angiogram chest.  The plan then was to continue the Eliquis and to reassess for high-resolution CT chest sometime in 2022.  She returns for follow-up.  She tells me that around Thanksgiving 2021 she started methotrexate 6 pills once a week on Saturdays.  Then sometime around end of December 2021 early January 2022 she started having several symptoms of nausea and headaches and feverishness and shortness of breath.  Covid was ruled out.  She admitted up getting admitted in the hospital.  CT angiogram chest ruled out pulmonary embolism.  I personally reviewed the CT scan and visualized it.  This was on October 31, 2020.  At the time of admission she was having symptoms for a few weeks.  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  The family is very concerned about this diagnosis.  She tells me however she was never treated with oxygen while in the hospital.  However she was discharged with oxygen and prednisone taper.  The prednisone taper is ending in a few days.  The methotrexate has been hold temporarily.  She has missed 1 week and dose last weekend.  The combination of the admission and the steroids and holding the methotrexate has improved her symptoms and her symptoms have resolved.  She has not been using the oxygen she was discharged  with.  Review of the records show that her pulse ox was in the low 90s on room air in the hospital.  She had a CT angiogram chest that ruled out pulmonary embolism.  No comment on ILD but it was a contrast CT scan.  High-sensitivity troponin x2 is normal.  She had asymptomatic cholelithiasis.  D-dimer was normal Covid PCR normal.   She was given a diagnosis of ILD flareup because in the emergency room she was desaturating to the low 80s with ambulation although she did not give this history at this visit.  This is based on review of records.  In the office today she did not desaturate.  The family is very concerned.  They have several questions and/concerns  -Etiology for dyspnea -this has been ongoing even since I last saw her in summer 2021 -Presence of interstitial lung disease or not -Implications of diagnosis of ILD flareup diagnosis given to them -Whether symptoms were part of methotrexate toxicity and the future role of methotrexate in treating her if she were to have ILD        OV 12/25/2020  Subjective:  Patient ID: [name], female , DOB: [birth year] , age 61 y.o. , MRN: [id number] , ADDRESS: [address] [place] [address] PCP Rachel Sanders, MD Patient Care Team: Rachel Sanders, MD as PCP - General Josue Hector, MD as PCP - Cardiology (Cardiology) Rosita Kea, PA-C as Physician Assistant (Rheumatology) Christain [place], [place] as Referring Physician (Optometry) Danella Sensing, MD as Consulting Physician (Dermatology) Mauro Kaufmann, RN as Oncology Nurse Navigator Rockwell [place], RN as Oncology Nurse Navigator  This Provider for this visit: Treatment Team:  Attending Provider: Brand Males, MD    12/25/2020 -   Chief Complaint  Patient presents with   Follow-up    Pt states she has been doing okay since last visit. States she feels like her breathing may be a little worse since last visit.    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  Dr. In charge of pulmonary embolism treatment  HPI [name] 80 y.o. -presents for follow-up after test results.  Presents with daughter Margorie John and also daughter [name].  They both are at the bedside physically.  Overall symptoms are stable.  They are here to discuss test results.  A lot of questions about various items.  Her pulmonary function test shows stability compared to June 2019.  Her CT scan is probable UIP and also shows stability compared to June 2019.  At this point in time they want dyspnea relief.  In addition they had questions about rheumatoid arthritis.  They plan to start Rituxan because of the joint issues.  That questions about the safety of Rituxan.  Their questions about how to prevent interstitial lung disease from getting worse.  We discussed acid reflux controlled with is not an issue for her.  Discussed preventing respiratory viruses so she will continue to mask for this.  In this context he wanted to know about risk of Covid especially with her getting Rituxan.  Did express that Rituxan does not monitor IgG and therefore they are interested in checking her Covid IgG in response to her 3 doses of COVID vaccine.  Last dose of Covid vaccine was in August 2021.  Based on this they will decide on taking monoclonal antibody prophylaxis or fourth dose Covid vaccine.  This will be done well before starting Rituxan.  She is interested in pulmonary rehabilitation.  We discussed monitoring approaches  to ILD.  Discussed why antifibrotic not indicated partly because of side effect profile and also currently is stable and non-- IPF phenotype.   CT Chest data feb 2022  IMPRESSION: 1. Spectrum of findings compatible with basilar predominant fibrotic interstitial lung disease without frank honeycombing and without appreciable interval  progression since 03/10/2018 high-resolution chest CT study. Differential includes NSIP or UIP. Findings are categorized as probable UIP per consensus guidelines: Diagnosis of Idiopathic Pulmonary Fibrosis: An Official ATS/ERS/JRS/ALAT Clinical Practice Guideline. [place], Iss 5, [id number], Jun 05 2017. 2. Scattered small bilateral pulmonary nodules are all stable since 2019, considered benign. 3. One vessel coronary atherosclerosis. 4. Mild diffuse hepatic steatosis. 5. Aortic Atherosclerosis (ICD10-I70.0).     Electronically Signed   By: Ilona Sorrel M.D.   On: 11/26/2020 11:53      Result History     Echo march 2022  IMPRESSIONS     1. Left ventricular ejection fraction, by estimation, is 60 to 65%. The  left ventricle has normal function. The left ventricle has no regional  wall motion abnormalities. Left ventricular diastolic parameters are  consistent with Grade I diastolic  dysfunction (impaired relaxation). GLS -22.1%, normal.   2. Right ventricular systolic function is normal. The right ventricular  size is normal. Tricuspid regurgitation signal is inadequate for assessing  PA pressure.   3. The mitral valve is normal in structure. Trivial mitral valve  regurgitation. No evidence of mitral stenosis.   4. The aortic valve is tricuspid. Aortic valve regurgitation is trivial.  Mild aortic valve sclerosis is present, with no evidence of aortic valve  stenosis.   5. The inferior vena cava is normal in size with greater than 50%  respiratory variability, suggesting right atrial pressure of 3 mmHg.   has a past medical history of Cancer ([place]), Cataract, Dysrhythmia, GERD (gastroesophageal reflux disease), Hyperlipidemia, Hypertension, Migraines, Obesity, Osteoarthritis, Personal history of radiation therapy, and Phlebitis of leg, right, superficial (1986/1989).   reports that she quit smoking about 57 years ago. Her smoking use included  cigarettes. She started smoking about 68 years ago. She has a 5.00 pack-year smoking history. She has never used smokeless tobacco.    OV 06/23/2021  Subjective:  Patient ID: [name], female , DOB: Oct 19, 1940 , age 64 y.o. , MRN: [id number] , ADDRESS: [address] [place] [id number] PCP Rachel Sanders, MD Patient Care Team: Rachel Sanders, MD as PCP - General Josue Hector, MD as PCP - Cardiology (Cardiology) Rosita Kea, PA-C (Inactive) as Physician Assistant (Rheumatology) Christain [place], [place] as Referring Physician (Optometry) Danella Sensing, MD as Consulting Physician (Dermatology) Mauro Kaufmann, RN as Oncology Nurse Navigator Rockwell [place], RN as Oncology Nurse Navigator  This Provider for this visit: Treatment Team:  Attending Provider: Brand Males, MD    06/23/2021 -   Chief Complaint  Patient presents with   Follow-up    PFT performed today.  Pt states she has been doing okay since last visit and states her breathing is about the same.    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  Dr. In charge of pulmonary embolism treatment  HPI [name] 80 y.o. -retuns for folllwoup. Symptoms score, walk test and PFT . Feels same.  No new isues other than fact a) she doesnot think o2 is helping her at night. She also states [facility] is providing poor service. Apparently they have no records of her at all but she has an o2 tank from them. For RA she is on plaquenil. The Rituxan never got started. She still has some active RA she says.  She says she will get flu shot end of oct 2022 with PCP. We discussed bivalent covid booster and she is agreeable   SYMPTOM SCALE - ILD 11/08/2020  06/23/2021 219#  O2 use ra ra  Shortness of Breath  0 -> 5 scale with 5 being worst (score 6 If unable to do)   At rest 0 0  Simple tasks - showers, clothes change, eating, shaving 2 3  Household (dishes, doing bed, laundry) 2 3  Shopping 2 3  Walking level at own pace 4 3  Walking up Stairs 3 4  Total (30-36) Dyspnea Score 13 16  How bad is your cough? 3   How bad is your fatigue 5   How bad is nausea 0   How bad is vomiting?  00   How bad is diarrhea? 0   How bad is anxiety? 0   How bad is depression 0        Simple office walk 185 feet x  3 laps goal with forehead probe 02/22/2018  04/24/2020  11/08/2020  06/23/2021   O2 used Room air ra ra ra  Number laps completed 3 all laps 3 2 Did all 3  Comments about pace Normal pace Nl pace 98% and 90/min Avg pace  Resting Pulse Ox/HR 98% and 85/min 99% and 76/min 94% and HR 130 100% and 81  Final Pulse Ox/HR 97% and 121/min 99% and 129/minn Slow pace 96% and 138/mn  Desaturated </= 88% no no  no  Desaturated <= 3% points no no Yes  4 points Yes, 4 point  Got Tachycardic >/= 90/min yes yes  yes  Symptoms at end of test Visibly Dyspneic and improved with rest. No chest pain or cough but no subjective dyspnea Denied dyspnea but visibly dyspenic to CMA  Mild dyspnea  Miscellaneous comments none x  x    CT Chest data  No results found.    PFT  PFT Results Latest Ref Rng & Units 06/23/2021 12/03/2020 03/11/2018  FVC-Pre L 1.99 2.00 2.06  FVC-Predicted Pre % 83 81 81  FVC-Post L - 2.00 -  FVC-Predicted Post % - 81 -  Pre FEV1/FVC % % 76 75 75  Post FEV1/FCV % % - 77 -  FEV1-Pre L 1.51 1.50 1.54  FEV1-Predicted Pre % 84 82 81  FEV1-Post L - 1.54 -  DLCO uncorrected ml/min/mmHg 18.01 17.08 18.80  DLCO UNC% % 99 94 82  DLCO corrected ml/min/mmHg 18.01 17.41 -  DLCO COR %Predicted % 99 96 -  DLVA Predicted % 110 112 102  TLC L - 3.84 -  TLC % Predicted % - 78 -  RV % Predicted % - 77 -       has a past medical history of Cancer ([place]), Cataract, Dysrhythmia, GERD  (gastroesophageal reflux disease), Hyperlipidemia, Hypertension, Migraines, Obesity, Osteoarthritis, Personal history of radiation therapy, and Phlebitis of leg, right, superficial (1986/1989).   reports that she quit smoking about 57 years ago. Her smoking use included cigarettes. She started smoking about 68 years ago. She has a 5.00 pack-year smoking history. She has never used smokeless tobacco.  Past Surgical History:  Procedure Laterality Date   BREAST BIOPSY Left 07/05/2019   BREAST BIOPSY Bilateral 08/24/2019   BREAST EXCISIONAL BIOPSY Left 09/20/2019   BREAST LUMPECTOMY Right 09/20/2019   BREAST LUMPECTOMY WITH RADIOACTIVE SEED LOCALIZATION Bilateral 09/20/2019   Procedure: BILATERAL BREAST LUMPECTOMIES WITH BILATERAL RADIOACTIVE SEED LOCALIZATION AND RIGHT NIPPLE BIOPSY;  Surgeon: Erroll Luna, MD;  Location: [facility];  Service: General;  Laterality: Bilateral;   CARDIAC CATHETERIZATION  1990s   negative   CARDIOVASCULAR STRESS TEST  2008   NML   CARDIOVASCULAR STRESS TEST  02/02/2007   EF 70%, NO ISCHEMIA   DILATION AND CURETTAGE, DIAGNOSTIC / THERAPEUTIC  2008   LESION DESTRUCTION  10/2017   Face; Dr. Ronnald Ramp   ROTATOR CUFF REPAIR  1999   Right, left in 2002   TOTAL KNEE ARTHROPLASTY  2008   Left   TOTAL KNEE ARTHROPLASTY  10/11/2012   Procedure: TOTAL KNEE ARTHROPLASTY;  Surgeon: Mauri Pole, MD;  Location: [facility];  Service: Orthopedics;  Laterality: Right;   US ECHOCARDIOGRAPHY  02/07/2007   EF 55-60%    Allergies  Allergen Reactions   Codeine Nausea And Vomiting   Hydrocodone Bit-Homatrop Mbr Other (See Comments)    Hallucinations     Immunization History  Administered Date(s) Administered   Fluad Quad(high Dose 65+) 06/27/2019, 08/13/2020   Influenza,inj,Quad PF,6+ Mos 08/05/2017, 08/04/2018   PFIZER(Purple Top)SARS-COV-2 Vaccination 10/20/2019, 11/10/2019, 05/28/2020, 01/15/2021   Pneumococcal Conjugate-13 07/10/2014   Pneumococcal  Polysaccharide-23 01/04/2008   Td 10/05/1996, 01/04/2008   Zoster, Live 02/01/2008    Family History  Problem Relation Age of Onset   Heart attack Father 14       PUD   Hypertension Father    Lung cancer Brother    Heart defect Sister    Hypertension Mother    Coronary artery disease Other        Female 1st degree relative <50     Current Outpatient Medications:    acetaminophen (TYLENOL) 500 MG tablet, Take 1,000 mg by mouth in the morning and at bedtime., Disp: , Rfl:    folic acid (FOLVITE) 1 MG tablet, Take 2 mg by mouth [name]., Disp: , Rfl:    hydrochlorothiazide (HYDRODIURIL) 25 MG tablet, Take 0.5 tablets (12.5 mg total) by mouth every other day., Disp: 45 tablet, Rfl: 1   hydroxychloroquine (PLAQUENIL) 200 MG tablet, Take 200 mg by mouth 2 (two) times [name]., Disp: , Rfl:    OXYGEN, Inhale 2 L into the lungs at bedtime., Disp: , Rfl:    predniSONE (DELTASONE) 5 MG tablet, Take 5 mg by mouth [name] with breakfast., Disp: , Rfl:    rivaroxaban (XARELTO) 20 MG TABS tablet, Take 1 tablet (20 mg total) by mouth [name] with supper., Disp: 90 tablet, Rfl: 3   sulfamethoxazole-trimethoprim (BACTRIM DS) 800-160 MG tablet, Take 1 tablet by mouth 2 (two) times [name]., Disp: 6 tablet, Rfl: 1   tolterodine (DETROL LA) 4 MG 24 hr capsule, Take 1 capsule (4 mg total) by mouth [name]., Disp: 30 capsule, Rfl: 11      Objective:   Vitals:   06/23/21 1529  BP: 138/70  Pulse: 81  Temp: 98 F (36.7 C)  TempSrc: Oral  SpO2: 100%  Weight: 219 lb (99.3 kg)  Height: '5\' 3"'$  (1.6 m)    Estimated body mass index is 38.79 kg/m as calculated from the following:   Height as of this encounter: '5\' 3"'$  (1.6 m).   Weight  as of this encounter: 219 lb (99.3 kg).  '@WEIGHTCHANGE'$ @  [employer]   06/23/21 1529  Weight: 219 lb (99.3 kg)     Physical Exam  General: No distress. obese Neuro: Alert and Oriented x 3. GCS 15. Speech normal Psych: Pleasant Resp:  Barrel Chest - no.  Wheeze - no,  Crackles - no, No overt respiratory distress CVS: Normal heart sounds. Murmurs - no Ext: Stigmata of Connective Tissue Disease - RA HEENT: Normal upper airway. PEERL +. No post nasal drip        Assessment:       ICD-10-CM   1. Interstitial lung disease due to connective tissue disease ([place])  J84.89 Pulmonary function test   M35.9 Ambulatory Referral for DME    2. 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 -Interstitial lung disease is stable between June 2019 and Sept  2022 -The future course of this is most likely 1 of stability at least for the next 1 or 2 years -Currently antifibrotic's are not indicated due to stability -Currently no clinical trial available for this particular situation - Night o2 not helping you and ADAPT not giving you good service  Plan -return  nighttime oxygen (not helping you subjectively)  -Serial monitoring with supportive care is recommended approach at this point  -Do spirometry and DLCO in 6-8 months  -Treatment of rheumatoid arthritis per primary care physician/rheumatologist  Shortness of breath on exertion  -This is due to interstitial lung disease/pulmonary fibrosis, diastolic dysfunction, weight issues and rheumatoid arthritis  Plan -Referral pulmonary rehabilitation if you did not do last time, never done or if you want  Vaccine counseling Advice given about COVID-19 virus infection   Plan -high dose flu shot with PCP Diona Browner, Amy E, MD end of October like you always do  - bivalent covid booster mRNA vaccine against [employer]/BA.5 - have it once 3 months elapse after your most recent covid mRNA "booster"  Follow-up -6-8 months do spirometry and DLCO -Return to see Dr. Chase Caller in 15 minutes slot in 6-8  months    SIGNATURE     Dr. Brand Males, M.D., F.C.C.P,  Pulmonary and Critical Care Medicine Staff Physician, [place] Director - Interstitial Lung Disease  Program  [facility] [place] [facility] [place], [place], [address]  Pager: [phone], If no answer or between  15:00h - 7:00h: call [phone] Telephone: [phone]  4:14 PM 06/23/2021

## 2021-06-23 NOTE — Patient Instructions (Addendum)
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 -Interstitial lung disease is stable between June 2019 and Sept  2022 -The future course of this is most likely 1 of stability at least for the next 1 or 2 years -Currently antifibrotic's are not indicated due to stability -Currently no clinical trial available for this particular situation - Night o2 not helping you and ADAPT not giving you good service  Plan -return  nighttime oxygen (not helping you subjectively)  -Serial monitoring with supportive care is recommended approach at this point  -Do spirometry and DLCO in 6-8 months  -Treatment of rheumatoid arthritis per primary care physician/rheumatologist  Shortness of breath on exertion  -This is due to interstitial lung disease/pulmonary fibrosis, diastolic dysfunction, weight issues and rheumatoid arthritis  Plan -Referral pulmonary rehabilitation if you did not do last time, never done or if you want  Vaccine counseling Advice given about COVID-19 virus infection   Plan -high dose flu shot with PCP [name], [name], [name] end of October like you always do  - bivalent covid booster mRNA vaccine against [employer]/BA.5 - have it once 3 months elapse after your most recent covid mRNA "booster"  Follow-up -6-8 months do spirometry and DLCO -Return to see [name] in 15 minutes slot in 6-8  months

## 2021-06-25 NOTE — Telephone Encounter (Signed)
I could not find any pap smear results in patient's chart.  I just found documentation that last pap was normal in 2008.

## 2021-06-27 ENCOUNTER — Telehealth: Payer: Self-pay | Admitting: Family Medicine

## 2021-06-27 MED ORDER — LOSARTAN POTASSIUM 25 MG PO TABS: 25.0000 mg | ORAL_TABLET | Freq: Every day | ORAL | 3 refills | Status: DC

## 2021-06-27 NOTE — Telephone Encounter (Signed)
Pt called in stated she would like to change her BP medication . Would like a call back [phone]

## 2021-06-27 NOTE — Telephone Encounter (Signed)
Mrs. Lamy notified as instructed by telephone.  She is agreeable to starting the low dose of Losartan.  [place]  She will call back to schedule her follow up in 2 weeks with Dr. Diona Browner.

## 2021-06-27 NOTE — Telephone Encounter (Signed)
Per past notes .. enalapril was stopped given cough side effect. I would instead suggest daily low dose losartan in place of HCTZ. If she notes peripheral swelling she can use HCTZ as needed as a diuretic.  Let me know pharmacy and if pt agreeable with this change.  Have her check home BPs.  Make 2 week follow up BP, with plan to check BMET at that [facility].

## 2021-06-27 NOTE — Telephone Encounter (Signed)
Spoke with Mrs. [name].  She states she is currently taking HCTZ 25 mg 1/2 tablet every other day.  She states she can't leave the house because she is having to urinate all the time.  She is asking for a change in her BP medications.  She thought she had taken Enalapril in the past.   Please advise.

## 2021-06-30 NOTE — Assessment & Plan Note (Signed)
Send urine for culture.

## 2021-06-30 NOTE — Assessment & Plan Note (Signed)
Trial of Detrol LA.

## 2021-06-30 NOTE — Assessment & Plan Note (Signed)
Apply  Desitin as a barrier cream.  Estrogen cream is contraindicated in ER positive breast cancer.

## 2021-07-01 ENCOUNTER — Other Ambulatory Visit: Payer: Self-pay | Admitting: Hematology and Oncology

## 2021-07-01 DIAGNOSIS — Z9889 Other specified postprocedural states: Secondary | ICD-10-CM

## 2021-07-17 ENCOUNTER — Encounter: Payer: Self-pay | Admitting: Family Medicine

## 2021-07-17 ENCOUNTER — Ambulatory Visit: Payer: Medicare Other | Admitting: Family Medicine

## 2021-07-17 ENCOUNTER — Other Ambulatory Visit: Payer: Self-pay

## 2021-07-17 VITALS — BP 138/72 | HR 73 | Temp 97.9°F | Ht 64.0 in | Wt 220.0 lb

## 2021-07-17 DIAGNOSIS — Z23 Encounter for immunization: Secondary | ICD-10-CM

## 2021-07-17 DIAGNOSIS — N3281 Overactive bladder: Secondary | ICD-10-CM

## 2021-07-17 DIAGNOSIS — I10 Essential (primary) hypertension: Secondary | ICD-10-CM | POA: Diagnosis not present

## 2021-07-17 DIAGNOSIS — M25551 Pain in right hip: Secondary | ICD-10-CM

## 2021-07-17 NOTE — Patient Instructions (Addendum)
Stop Detrol LA as it not helping.  Call if interested in urology referral for overactive bladder.  Get  new BP cuff at home... call with measurement in 2 weeks.  Can use tylenol for pain as needed.  You can go to [facility] for X-ray when you can. M-F 8 to 4 PM

## 2021-07-17 NOTE — Progress Notes (Signed)
Patient ID: [name], female    DOB: 12-12-1940, 80 y.o.   MRN: [id number]  This visit was conducted in person.  BP 138/72   Pulse 73   Temp 97.9 F (36.6 C) (Temporal)   Ht 5\' 4"  (1.626 m)   Wt 220 lb (99.8 kg)   SpO2 96%   BMI 37.76 kg/m    CC: Chief Complaint  Patient presents with   Hypertension    Here for 2 wk f/u.    Subjective:   HPI: [name] is a 80 y.o. female presenting on 07/17/2021 for Hypertension (Here for 2 wk f/u.)   She has been noting BPs elevated 165-178/77-84... in last 10-12 days. She has had mild headache.  Cuff is old.  No change in salt intake. She been in pain right hip.Marland Kitchen lateral.  Moving in bed/ getting in and out of bed. Walking does not cause pain.   No known fall but 1 month ago started to fall and felt suden pain in right hip.  No numbness, no weakness in leg.   Hypertension:   BP Readings from Last 3 Encounters:  07/17/21 138/72  06/23/21 138/70  06/05/21 (!) 148/76  Using medication without problems or lightheadedness:  none Chest pain with exertion: none Edema:none Short of breath: none Average home BPs: Other issues:    OAB: on detrol LA.. now on higher does last month.. 4 mg.. did not help.  Relevant past medical, surgical, family and social history reviewed and updated as indicated. Interim medical history since our last visit reviewed. Allergies and medications reviewed and updated. Outpatient Medications Prior to Visit  Medication Sig Dispense Refill   acetaminophen (TYLENOL) 500 MG tablet Take 1,000 mg by mouth in the morning and at bedtime.     folic acid (FOLVITE) 1 MG tablet Take 2 mg by mouth daily.     hydroxychloroquine (PLAQUENIL) 200 MG tablet Take 200 mg by mouth 2 (two) times daily.     losartan (COZAAR) 25 MG tablet Take 1 tablet (25 mg total) by mouth daily. 90 tablet 3   predniSONE (DELTASONE) 5 MG tablet Take 5 mg by mouth daily with breakfast.     rivaroxaban (XARELTO) 20 MG  TABS tablet Take 1 tablet (20 mg total) by mouth daily with supper. 90 tablet 3   tolterodine (DETROL LA) 4 MG 24 hr capsule Take 1 capsule (4 mg total) by mouth daily. 30 capsule 11   OXYGEN Inhale 2 L into the lungs at bedtime.     sulfamethoxazole-trimethoprim (BACTRIM DS) 800-160 MG tablet Take 1 tablet by mouth 2 (two) times daily. 6 tablet 1   No facility-administered medications prior to visit.     Per HPI unless specifically indicated in ROS section below Review of Systems  Constitutional:  Negative for fatigue and fever.  HENT:  Negative for congestion.   Eyes:  Negative for pain.  Respiratory:  Negative for cough and shortness of breath.   Cardiovascular:  Negative for chest pain, palpitations and leg swelling.  Gastrointestinal:  Negative for abdominal pain.  Genitourinary:  Negative for dysuria and vaginal bleeding.  Musculoskeletal:  Positive for arthralgias. Negative for back pain.  Neurological:  Negative for syncope, light-headedness and headaches.  Psychiatric/Behavioral:  Negative for dysphoric mood.   Objective:  BP 138/72   Pulse 73   Temp 97.9 F (36.6 C) (Temporal)   Ht 5\' 4"  (1.626 m)   Wt 220 lb (99.8 kg)   SpO2 96%  BMI 37.76 kg/m   Wt Readings from Last 3 Encounters:  07/17/21 220 lb (99.8 kg)  06/23/21 219 lb (99.3 kg)  06/05/21 217 lb (98.4 kg)      Physical Exam Constitutional:      General: She is not in acute distress.    Appearance: Normal appearance. She is well-developed. She is obese. She is not ill-appearing or toxic-appearing.  HENT:     Head: Normocephalic.     Right Ear: Hearing, tympanic membrane, ear canal and external ear normal. Tympanic membrane is not erythematous, retracted or bulging.     Left Ear: Hearing, tympanic membrane, ear canal and external ear normal. Tympanic membrane is not erythematous, retracted or bulging.     Nose: No mucosal edema or rhinorrhea.     Right Sinus: No maxillary sinus tenderness or frontal sinus  tenderness.     Left Sinus: No maxillary sinus tenderness or frontal sinus tenderness.     Mouth/Throat:     Pharynx: Uvula midline.  Eyes:     General: Lids are normal. Lids are everted, no foreign bodies appreciated.     Conjunctiva/sclera: Conjunctivae normal.     Pupils: Pupils are equal, round, and reactive to light.  Neck:     Thyroid: No thyroid mass or thyromegaly.     Vascular: No carotid bruit.     Trachea: Trachea normal.  Cardiovascular:     Rate and Rhythm: Normal rate and regular rhythm.     Pulses: Normal pulses.     Heart sounds: Normal heart sounds, S1 normal and S2 normal. No murmur heard.   No friction rub. No gallop.  Pulmonary:     Effort: Pulmonary effort is normal. No tachypnea or respiratory distress.     Breath sounds: Normal breath sounds. No decreased breath sounds, wheezing, rhonchi or rales.  Abdominal:     General: Bowel sounds are normal.     Palpations: Abdomen is soft.     Tenderness: There is no abdominal tenderness.  Musculoskeletal:     Cervical back: Normal range of motion and neck supple.     Lumbar back: No tenderness or bony tenderness. Decreased range of motion.     Right hip: Tenderness and bony tenderness present. No crepitus. Decreased range of motion. Normal strength.  Skin:    General: Skin is warm and dry.     Findings: No rash.  Neurological:     Mental Status: She is alert.  Psychiatric:        Mood and Affect: Mood is not anxious or depressed.        Speech: Speech normal.        Behavior: Behavior normal. Behavior is cooperative.        Thought Content: Thought content normal.        Judgment: Judgment normal.      Results for orders placed or performed in visit on 06/23/21  Pulmonary function test  Result Value Ref Range   FVC-Pre 1.99 L   FVC-%Pred-Pre 83 %   FEV1-Pre 1.51 L   FEV1-%Pred-Pre 84 %   FEV6-Pre 1.99 L   FEV6-%Pred-Pre 88 %   Pre FEV1/FVC ratio 76 %   FEV1FVC-%Pred-Pre 102 %   Pre FEV6/FVC Ratio 100 %    FEV6FVC-%Pred-Pre 106 %   FEF 25-75 Pre 1.15 L/sec   FEF2575-%Pred-Pre 91 %   DLCO unc 18.01 ml/min/mmHg   DLCO unc % pred 99 %   DLCO cor 18.01 ml/min/mmHg   DLCO cor %  pred 99 %   DL/VA 4.54 ml/min/mmHg/L   DL/VA % pred 110 %    This visit occurred during the SARS-CoV-2 public health emergency.  Safety protocols were in place, including screening questions prior to the visit, additional usage of staff PPE, and extensive cleaning of exam room while observing appropriate contact time as indicated for disinfecting solutions.   COVID 19 screen:  No recent travel or known exposure to COVID19 The patient denies respiratory symptoms of COVID 19 at this time. The importance of social distancing was discussed today.   Assessment and Plan    Problem List Items Addressed This Visit     Essential hypertension, benign    At goal in office, get new cuff, follow at home, call if above goal.  Low salt, high K, avoid ETOH.   Continue current medication.   Losartan 25 mg daily      OAB (overactive bladder)    Dettrol LA  Not effective, at max dose for adequate trial. Stop Detrol LA as it not helping.  Call if interested in urology referral for overactive bladder.      Right hip pain    Likely OA vs trochanteric bursitis.Marland Kitchen eval with X-ray.      Relevant Orders   DG Lumbar Spine Complete (Completed)   Other Visit Diagnoses     Need for influenza vaccination    -  Primary   Relevant Orders   Flu Vaccine QUAD High Dose(Fluad) (Completed)      Orders Placed This Encounter  Procedures   DG Lumbar Spine Complete    Standing Status:   Future    Number of Occurrences:   1    Standing Expiration Date:   07/17/2022    Order Specific Question:   Reason for Exam (SYMPTOM  OR DIAGNOSIS REQUIRED)    Answer:   pain right low back after catching herself in a fall    Order Specific Question:   Preferred imaging location?    Answer:   [id number]   Flu Vaccine QUAD High  Dose(Fluad)    Eliezer Lofts, MD

## 2021-07-18 ENCOUNTER — Ambulatory Visit: Payer: Medicare Other | Admitting: Hematology and Oncology

## 2021-07-21 ENCOUNTER — Other Ambulatory Visit: Payer: Self-pay

## 2021-07-21 ENCOUNTER — Other Ambulatory Visit: Payer: Self-pay | Admitting: Family Medicine

## 2021-07-21 ENCOUNTER — Ambulatory Visit
Admission: RE | Admit: 2021-07-21 | Discharge: 2021-07-21 | Disposition: A | Discharge status: Home / Self Care | Payer: Medicare Other | Source: Ambulatory Visit | Attending: Family Medicine | Admitting: Family Medicine

## 2021-07-21 DIAGNOSIS — M25551 Pain in right hip: Secondary | ICD-10-CM

## 2021-07-24 ENCOUNTER — Telehealth: Payer: Self-pay | Admitting: Family Medicine

## 2021-07-24 NOTE — Telephone Encounter (Signed)
Pt missed her call and is wanting a call back. She does not know why she got a call. Please advise at [phone]

## 2021-07-25 ENCOUNTER — Other Ambulatory Visit: Payer: Self-pay | Admitting: Hematology and Oncology

## 2021-07-25 ENCOUNTER — Other Ambulatory Visit: Payer: Self-pay | Admitting: Family Medicine

## 2021-07-25 DIAGNOSIS — R937 Abnormal findings on diagnostic imaging of other parts of musculoskeletal system: Secondary | ICD-10-CM | POA: Insufficient documentation

## 2021-07-25 DIAGNOSIS — M545 Low back pain, unspecified: Secondary | ICD-10-CM

## 2021-07-25 DIAGNOSIS — M25551 Pain in right hip: Secondary | ICD-10-CM

## 2021-07-25 MED ORDER — SOLIFENACIN SUCCINATE 5 MG PO TABS: 5.0000 mg | ORAL_TABLET | Freq: Every day | ORAL | 0 refills | Status: DC

## 2021-07-25 NOTE — Progress Notes (Signed)
I will place referral to PMR. For back/hip pain.   For OAB I will send in a trial of vesicare.

## 2021-08-05 ENCOUNTER — Other Ambulatory Visit: Payer: Self-pay

## 2021-08-05 ENCOUNTER — Ambulatory Visit
Admission: RE | Admit: 2021-08-05 | Discharge: 2021-08-05 | Disposition: A | Discharge status: Home / Self Care | Payer: Medicare Other | Source: Ambulatory Visit | Attending: Hematology and Oncology | Admitting: Hematology and Oncology

## 2021-08-05 DIAGNOSIS — Z9889 Other specified postprocedural states: Secondary | ICD-10-CM

## 2021-08-07 ENCOUNTER — Encounter: Payer: Self-pay | Admitting: Physical Medicine and Rehabilitation

## 2021-08-18 ENCOUNTER — Telehealth: Payer: Self-pay | Admitting: *Deleted

## 2021-08-18 ENCOUNTER — Other Ambulatory Visit: Payer: Self-pay | Admitting: Family Medicine

## 2021-08-18 NOTE — Telephone Encounter (Signed)
[name] wanted to let Dr. Diona Browner know she is not able to get an appointment with [facility] until November 05, 2021.  She is not sure she can wait that long.  Wants to know if Dr. Diona Browner has any other recommendations to help with her back pain until she can get in with [facility].  Please advise.

## 2021-08-19 MED ORDER — PREDNISONE 20 MG PO TABS: ORAL_TABLET | ORAL | 0 refills | Status: DC

## 2021-08-19 NOTE — Telephone Encounter (Signed)
I think she means PM and R.  I will send in a course of prednisone for her to use for pain and inflammation. Sent  to [employer]. She will take a higher dose  in place of her daily for RA.   If not improving with this... does Dr. Lorelei Pont inject trochanteric bursa ( hip)?

## 2021-08-19 NOTE — Telephone Encounter (Signed)
[name] notified by telephone that Dr. Diona Browner sent her in a Rx for prednisone for her to use for pain and inflammation.  I will forward message to Dr. Lorelei Pont to review chart and make sure he is okay doing a hip injection, if she is not improving after prednisone and until she can get in with PM and R.  Please advise.

## 2021-08-19 NOTE — Telephone Encounter (Signed)
Pt wants to know what the medication is for. Pt wants to be called back regarding this

## 2021-08-20 NOTE — Progress Notes (Signed)
Patient Care Team: [name], MD as PCP - General Josue Hector, MD as PCP - Cardiology (Cardiology) Rosita Kea, PA-C (Inactive) as Physician Assistant (Rheumatology) Christain [place], [place] as Referring Physician (Optometry) Danella Sensing, MD as Consulting Physician (Dermatology) Mauro Kaufmann, RN as Oncology Nurse Navigator Rockwell Germany, RN as Oncology Nurse Navigator  DIAGNOSIS:    ICD-10-CM   1. Ductal carcinoma in situ (DCIS) of right breast  D05.11       SUMMARY OF ONCOLOGIC HISTORY: Oncology History  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/27/2019 Initial Diagnosis   Patient reported two weeks of spontaneous right nipple discharge. 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 Biopsy on 07/05/19 showed fibrocystic changes in the left breast, no evidence of malignancy. Breast MRI on 08/11/19 showed a 1.7cm right nipple base mass, a 0.9cm superior central right breast mass, and indeterminate non-mass enhancement in the central left breast. Biopsy on 08/24/19 showed no evidence of malignancy in the left breast, and in the right breast, DCIS with necrosis, high grade, ER+ 100%, PR+ 70%.    09/20/2019 Surgery   Bilateral lumpectomies ([facility]): Left breast: complex sclerosing lesion with no evidence of malignancy Right breast: nipple adenoma with intraductal papilloma and DCIS, intermediate grade, 0.4cm, clear margins.   11/28/2019 -  Radiation Therapy   Adjuvant radiation     CHIEF COMPLIANT: Follow-up of right breast DCIS, bilateral PE  INTERVAL HISTORY: [name] is a 80 y.o. with above-mentioned history of right breast DCIS treated with lumpectomy, radiation, and who declined antiestrogen therapy due to a history of superficial venous thrombosis. She also has a history of bilateral pulmonary emboli, for which she is  currently on anticoagulation with Xarelto. Mammogram on 08/05/2021 showed no evidence of malignancy. She presents to the clinic today for follow-up.   ALLERGIES:  is allergic to codeine and hydrocodone bit-homatrop mbr.  MEDICATIONS:  Current Outpatient Medications  Medication Sig Dispense Refill   acetaminophen (TYLENOL) 500 MG tablet Take 1,000 mg by mouth in the morning and at bedtime.     folic acid (FOLVITE) 1 MG tablet Take 2 mg by mouth daily.     hydroxychloroquine (PLAQUENIL) 200 MG tablet Take 200 mg by mouth 2 (two) times daily.     losartan (COZAAR) 25 MG tablet Take 1 tablet (25 mg total) by mouth daily. 90 tablet 3   predniSONE (DELTASONE) 20 MG tablet 2 tabs by mouth daily x 5 days,  then 1 tab by mouth daily x 5 days 15 tablet 0   predniSONE (DELTASONE) 5 MG tablet Take 5 mg by mouth daily with breakfast.     solifenacin (VESICARE) 5 MG tablet Take 1 tablet by mouth once daily 90 tablet 0   XARELTO 20 MG TABS tablet TAKE 1 TABLET BY MOUTH  DAILY WITH SUPPER 90 tablet 3   No current facility-administered medications for this visit.    PHYSICAL EXAMINATION: ECOG PERFORMANCE STATUS: 1 - Symptomatic but completely ambulatory  Vitals:   08/21/21 1112  BP: (!) 172/59  Pulse: 82  Resp: 17  Temp: 97.8 F (36.6 C)  SpO2: 97%   Filed Weights   08/21/21 1112  Weight: 222 lb 11.2 oz (101 kg)      LABORATORY DATA:  I have reviewed the data as listed CMP Latest Ref Rng & Units 11/07/2020  11/01/2020 11/01/2020  Glucose 70 - 99 mg/dL 120(H) 92 -  BUN 6 - 23 mg/dL 15 9 -  Creatinine 0.40 - 1.20 mg/dL 0.78 0.70 -  Sodium 135 - 145 mEq/L 136 135 138  Potassium 3.5 - 5.1 mEq/L 4.8 3.4(L) 3.3(L)  Chloride 96 - 112 mEq/L 100 99 -  CO2 19 - 32 mEq/L 30 22 -  Calcium 8.4 - 10.5 mg/dL 9.6 8.6(L) -  Total Protein 6.0 - 8.3 g/dL 8.0 - -  Total Bilirubin 0.2 - 1.2 mg/dL 0.4 - -  Alkaline Phos 39 - 117 U/L 93 - -  AST 0 - 37 U/L 22 - -  ALT 0 - 35 U/L 28 - -    Lab Results   Component Value Date   WBC 11.6 (H) 11/07/2020   HGB 12.8 11/07/2020   HCT 38.4 11/07/2020   MCV 95.3 11/07/2020   PLT 355.0 11/07/2020   NEUTROABS 6.6 11/07/2020    ASSESSMENT & PLAN:  Ductal carcinoma in situ (DCIS) of right breast 09/10/2019:Bilateral lumpectomies ([facility]): Left breast: complex sclerosing lesion with no evidence of malignancy Right breast: nipple adenoma with intraductal papilloma and DCIS, intermediate grade, 0.4cm, clear margins.  ER 100%, PR 70% Tis NX stage 0   Treatment plan: Adjuvant radiation 11/28/2019-12/19/2019 Patient decided not to take antiestrogen therapy because of her history of superficial venous thrombosis. 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  She will discuss with her primary care physician about weight loss medication. RTC in 1 year    No orders of the defined types were placed in this encounter.  The patient has a good understanding of the overall plan. she agrees with it. she will call with any problems that may develop before the next visit here.  Total time spent: 20 mins including face to face time and time spent for planning, charting and coordination of care  Rulon Eisenmenger, MD, MPH 08/21/2021  I, Thana Ates, am acting as scribe for Dr. Nicholas Lose.  I have reviewed the above documentation for accuracy and completeness, and I agree with the above.

## 2021-08-20 NOTE — Telephone Encounter (Signed)
Mrs. Philipp notified by telephone that Dr. Lorelei Pont is happy to see her for evaluation at any point.

## 2021-08-20 NOTE — Telephone Encounter (Signed)
I am happy to see her and evaluate.  She can f/u at any point.

## 2021-08-21 ENCOUNTER — Inpatient Hospital Stay: Payer: Medicare Other | Attending: Hematology and Oncology | Admitting: Hematology and Oncology

## 2021-08-21 ENCOUNTER — Other Ambulatory Visit: Payer: Self-pay

## 2021-08-21 DIAGNOSIS — D0511 Intraductal carcinoma in situ of right breast: Secondary | ICD-10-CM | POA: Diagnosis present

## 2021-08-21 DIAGNOSIS — Z79899 Other long term (current) drug therapy: Secondary | ICD-10-CM | POA: Diagnosis not present

## 2021-08-21 DIAGNOSIS — Z86711 Personal history of pulmonary embolism: Secondary | ICD-10-CM | POA: Insufficient documentation

## 2021-08-21 DIAGNOSIS — Z86718 Personal history of other venous thrombosis and embolism: Secondary | ICD-10-CM | POA: Insufficient documentation

## 2021-08-21 DIAGNOSIS — M069 Rheumatoid arthritis, unspecified: Secondary | ICD-10-CM | POA: Insufficient documentation

## 2021-08-21 DIAGNOSIS — Z923 Personal history of irradiation: Secondary | ICD-10-CM | POA: Diagnosis not present

## 2021-08-21 DIAGNOSIS — Z7901 Long term (current) use of anticoagulants: Secondary | ICD-10-CM | POA: Diagnosis not present

## 2021-08-21 NOTE — Assessment & Plan Note (Signed)
Dettrol LA  Not effective, at max dose for adequate trial. Stop Detrol LA as it not helping.  Call if interested in urology referral for overactive bladder.

## 2021-08-21 NOTE — Assessment & Plan Note (Signed)
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

## 2021-08-21 NOTE — Assessment & Plan Note (Signed)
Likely OA vs trochanteric bursitis.Marland Kitchen eval with X-ray.

## 2021-08-21 NOTE — Assessment & Plan Note (Signed)
At goal in office, get new cuff, follow at home, call if above goal.  Low salt, high K, avoid ETOH.   Continue current medication.   Losartan 25 mg daily

## 2021-09-15 ENCOUNTER — Encounter: Payer: Self-pay | Admitting: Family Medicine

## 2021-09-15 ENCOUNTER — Ambulatory Visit: Payer: Medicare Other | Admitting: Family Medicine

## 2021-09-15 ENCOUNTER — Other Ambulatory Visit: Payer: Self-pay

## 2021-09-15 VITALS — BP 140/60 | HR 95 | Temp 99.0°F | Ht 64.0 in | Wt 220.0 lb

## 2021-09-15 DIAGNOSIS — R197 Diarrhea, unspecified: Secondary | ICD-10-CM | POA: Diagnosis not present

## 2021-09-15 DIAGNOSIS — U071 COVID-19: Secondary | ICD-10-CM

## 2021-09-15 DIAGNOSIS — H10023 Other mucopurulent conjunctivitis, bilateral: Secondary | ICD-10-CM

## 2021-09-15 DIAGNOSIS — J020 Streptococcal pharyngitis: Secondary | ICD-10-CM

## 2021-09-15 DIAGNOSIS — J029 Acute pharyngitis, unspecified: Secondary | ICD-10-CM

## 2021-09-15 DIAGNOSIS — R051 Acute cough: Secondary | ICD-10-CM | POA: Diagnosis not present

## 2021-09-15 LAB — POCT RAPID STREP A (OFFICE): Rapid Strep A Screen: POSITIVE — AB

## 2021-09-15 LAB — POC COVID19 BINAXNOW: SARS Coronavirus 2 Ag: POSITIVE — AB

## 2021-09-15 LAB — POC INFLUENZA A&B (BINAX/QUICKVUE)
Influenza A, POC: NEGATIVE
Influenza B, POC: NEGATIVE

## 2021-09-15 MED ORDER — AMOXICILLIN 500 MG PO CAPS: 500.0000 mg | ORAL_CAPSULE | Freq: Two times a day (BID) | ORAL | 0 refills | Status: AC

## 2021-09-15 MED ORDER — POLYMYXIN B-TRIMETHOPRIM 10000-0.1 UNIT/ML-% OP SOLN: 1.0000 [drp] | Freq: Four times a day (QID) | OPHTHALMIC | 0 refills | Status: AC

## 2021-09-15 MED ORDER — MOLNUPIRAVIR EUA 200MG CAPSULE: 4.0000 | ORAL_CAPSULE | Freq: Two times a day (BID) | ORAL | 0 refills | Status: AC

## 2021-09-15 NOTE — Progress Notes (Signed)
Rachel Dredge T. Rachel Lohnes, MD, [place] at [facility] [place], [address]  Phone: [phone]  FAX: [name] - 80 y.o. female  MRN [id number]  Date of Birth: 18-May-1941  Date: 09/15/2021  PCP: [name], MD  Referral: [name], MD  Chief Complaint  Patient presents with   Conjunctivitis    This visit occurred during the SARS-CoV-2 public health emergency.  Safety protocols were in place, including screening questions prior to the visit, additional usage of staff PPE, and extensive cleaning of exam room while observing appropriate contact time as indicated for disinfecting solutions.   Subjective:   [name] is a 80 y.o. very pleasant female patient with Body mass index is 37.76 kg/m. who presents with the following:  L > R eyes are very matted and the roof of her mouth.  Taking some mucinex right now.  2 of her grandchildren had pinkeye, and now she has some bilateral crusting and pinkish conjunctivo-.  This happened over the weekend.  She also has a sore throat now.  She is coughing and his voice is quite raspy.  She is not short of breath.  Does have some mild sinusitis symptoms only without any significant ear pain and no real headache.  She has had some diarrhea.  She does not think she has had any known COVID exposures.  Review of Systems is noted in the HPI, as appropriate  Objective:   BP 140/60   Pulse 95   Temp 99 F (37.2 C) (Temporal)   Ht 5\' 4"  (1.626 m)   Wt 220 lb (99.8 kg)   SpO2 95%   BMI 37.76 kg/m    Gen: WDWN, cooperative. Globally Non-toxic HEENT: Normocephalic and atraumatic. Throat clear, w/o exudate, R TM clear, L TM - good landmarks, No fluid present. rhinnorhea. No frontal or maxillary sinus T. MMM NECK: Anterior cervical  LAD is present CV: RRR, No M/G/R, cap refill <2 sec PULM: Breathing comfortably in no respiratory distress. no  wheezing, crackles, rhonchi ABD: S,NT,ND,+BS. No HSM. No rebound. MSK: Nml gait   Laboratory and Imaging Data: Results for orders placed or performed in visit on 09/15/21  POC Influenza A&B (Binax test)  Result Value Ref Range   Influenza A, POC Negative Negative   Influenza B, POC Negative Negative  POCT rapid strep A  Result Value Ref Range   Rapid Strep A Screen Positive (A) Negative  POC COVID-19  Result Value Ref Range   SARS Coronavirus 2 Ag Positive (A) Negative     Assessment and Plan:     ICD-10-CM   1. COVID-19  U07.1     2. Pink eye disease of both eyes  H10.023     3. Diarrhea, unspecified type  R19.7     4. Sore throat  J02.9 POCT rapid strep A    5. Acute cough  R05.1 POC Influenza A&B (Binax test)    POC COVID-19    6. Strep throat  J02.0      She actually has both COVID-19 and strep throat at the same time.  Both tests were definitely positive and I reviewed them myself.  High risk patient i am gonna placed the patient on antivirals for COVID and also place her on some amoxicillin.  Unfortunately, she also has pinkeye.  Probably viral, but I am also going to place this patient on some Polytrim.  She does have  interstitial lung disease.  Right now her lungs are clear, and she is not short of breath.  O2 sat is 95% on room air.  If she gets more short of breath or has some wheezing then I would have a low threshold for increasing her oral prednisone dosing  Meds ordered this encounter  Medications   trimethoprim-polymyxin b (POLYTRIM) ophthalmic solution    Sig: Place 1 drop into both eyes 4 (four) times daily for 7 days.    Dispense:  10 mL    Refill:  0   molnupiravir EUA (LAGEVRIO) 200 mg CAPS capsule    Sig: Take 4 capsules (800 mg total) by mouth 2 (two) times daily for 5 days.    Dispense:  40 capsule    Refill:  0   amoxicillin (AMOXIL) 500 MG capsule    Sig: Take 1 capsule (500 mg total) by mouth 2 (two) times daily for 10 days.     Dispense:  20 capsule    Refill:  0   Medications Discontinued During This Encounter  Medication Reason   predniSONE (DELTASONE) 20 MG tablet Completed Course   Orders Placed This Encounter  Procedures   POC Influenza A&B (Binax test)   POCT rapid strep A   POC COVID-19    Follow-up: No follow-ups on file.  Dragon [employer] One speech-to-text software was used for transcription in this dictation.  Possible transcriptional errors can occur using Editor, commissioning.   Signed,  Maud Deed. Nianna Igo, MD   Outpatient Encounter Medications as of 09/15/2021  Medication Sig   acetaminophen (TYLENOL) 500 MG tablet Take 1,000 mg by mouth in the morning and at bedtime.   amoxicillin (AMOXIL) 500 MG capsule Take 1 capsule (500 mg total) by mouth 2 (two) times daily for 10 days.   folic acid (FOLVITE) 1 MG tablet Take 2 mg by mouth daily.   hydroxychloroquine (PLAQUENIL) 200 MG tablet Take 200 mg by mouth 2 (two) times daily.   losartan (COZAAR) 25 MG tablet Take 1 tablet (25 mg total) by mouth daily.   molnupiravir EUA (LAGEVRIO) 200 mg CAPS capsule Take 4 capsules (800 mg total) by mouth 2 (two) times daily for 5 days.   predniSONE (DELTASONE) 5 MG tablet Take 5 mg by mouth daily with breakfast.   solifenacin (VESICARE) 5 MG tablet Take 1 tablet by mouth once daily   trimethoprim-polymyxin b (POLYTRIM) ophthalmic solution Place 1 drop into both eyes 4 (four) times daily for 7 days.   XARELTO 20 MG TABS tablet TAKE 1 TABLET BY MOUTH  DAILY WITH SUPPER   [DISCONTINUED] predniSONE (DELTASONE) 20 MG tablet 2 tabs by mouth daily x 5 days,  then 1 tab by mouth daily x 5 days   No facility-administered encounter medications on file as of 09/15/2021.

## 2021-09-17 ENCOUNTER — Telehealth: Payer: Self-pay | Admitting: Family Medicine

## 2021-09-17 NOTE — Telephone Encounter (Signed)
Pt called in requesting a call back regarding concern about eye medication . Please Advise [phone]

## 2021-09-17 NOTE — Telephone Encounter (Signed)
Spoke with Mrs. [name].  She states she has used the eye drops for 2 days but her eyes are still itching and there is still a little pus.  I advised that it will take a little longer for the drop to fully clear up her eyes.  I instructed her to make sure she is using the drops 4 times a day as prescribed and to make sure to does the full 7 day course. I also recommended using warm compresses on her eyes to help with itching.  Patient states understanding.  She also stated that she went home and took 2 home Covid test and they were both negative.  She states that it doesn't matter because she is still isolating away from everybody.  She states her throat is feeling better.  FYI to Dr. Lorelei Pont.

## 2021-09-24 IMAGING — CT CT CHEST HIGH RESOLUTION W/O CM
1 of 4 series · 14 of 30 positions shown, 18 images · non-contrast
Comparison: 10/31/2020 chest CT angiogram. 04/01/2020 chest CT
angiogram.

CLINICAL DATA: Dyspnea on exertion. History of right breast cancer
treated with conservation therapy. Follow-up pulmonary nodule.

EXAM:
CT CHEST WITHOUT CONTRAST
TECHNIQUE: Multidetector CT imaging of the chest was performed following the
standard protocol without intravenous contrast. High resolution
imaging of the lungs, as well as inspiratory and expiratory imaging,
was performed.

[Series 2: chest · axial · 0.68mm/px · z∈[-308,-22]mm · 14 of 159 slices shown, 18 images]
[im 8/159  mediastinal]
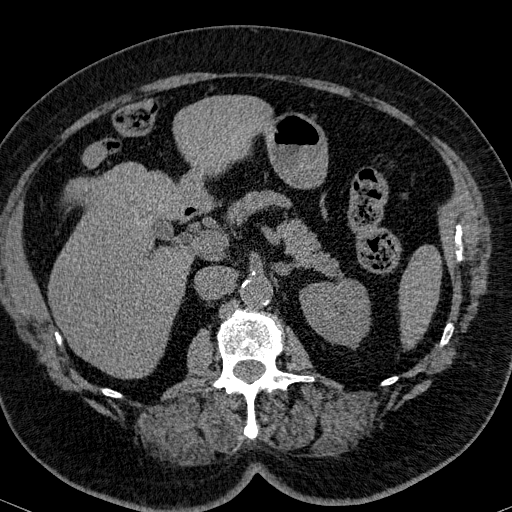
[im 8/159  lung]
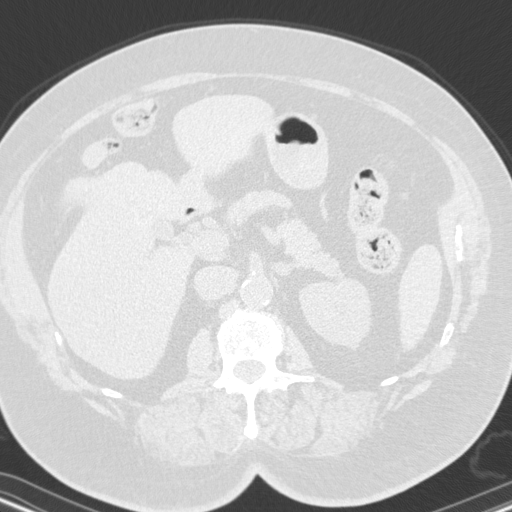
[im 24/159  lung]
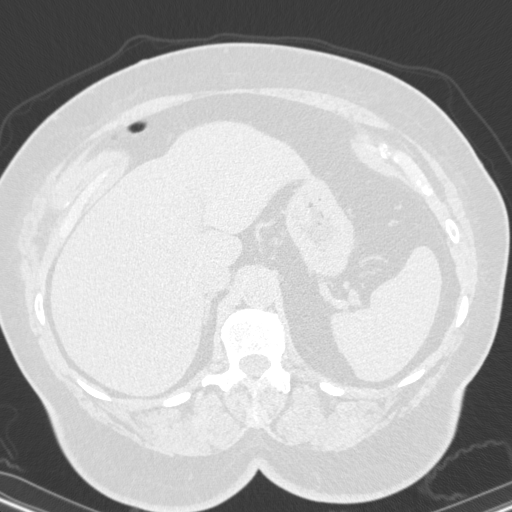
[im 32/159  lung]
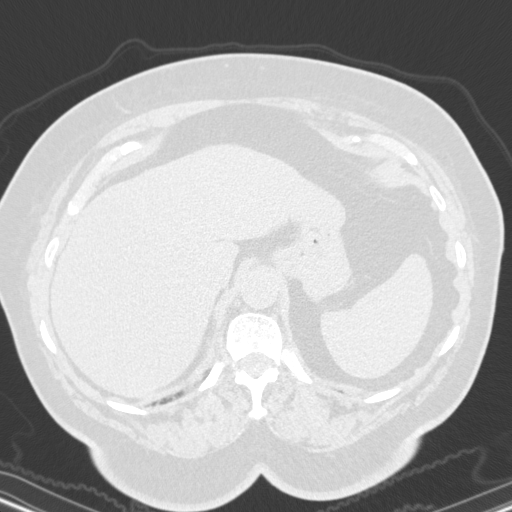
[im 48/159  lung]
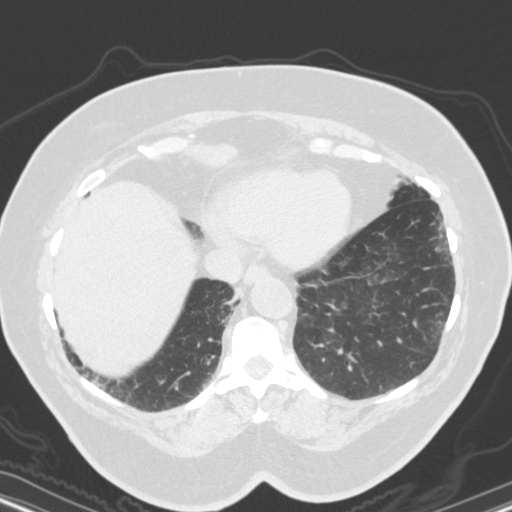
[im 56/159  mediastinal]
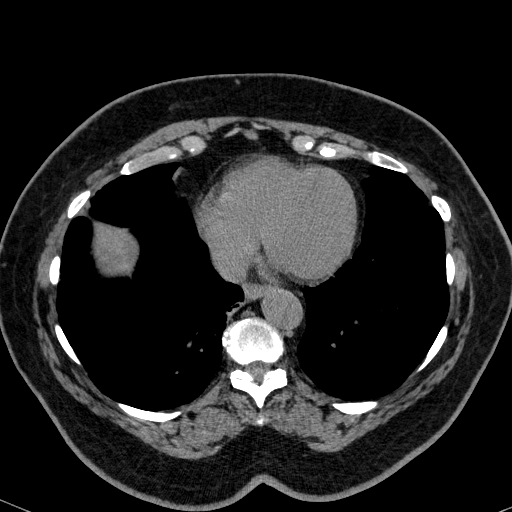
[im 56/159  lung]
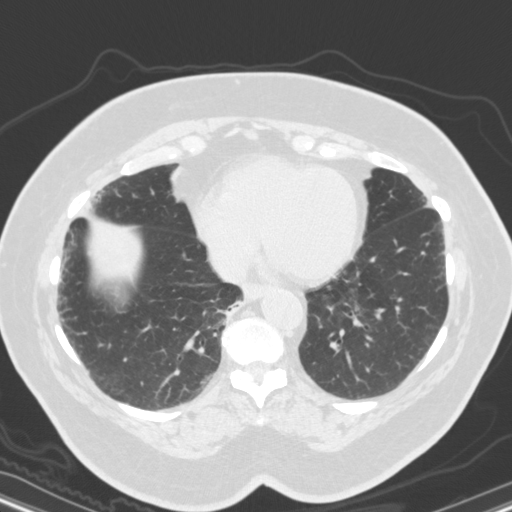
[im 64/159  lung]
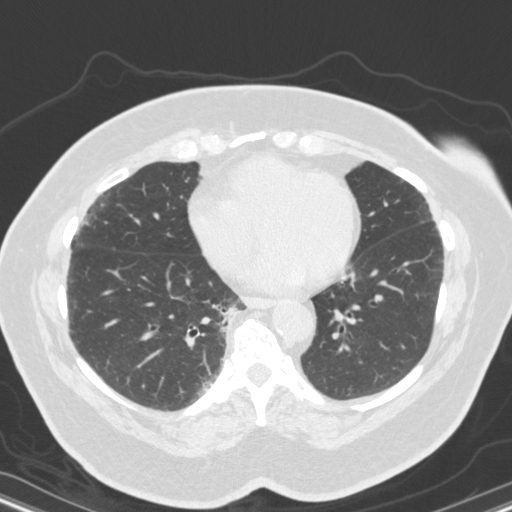
[im 80/159  lung]
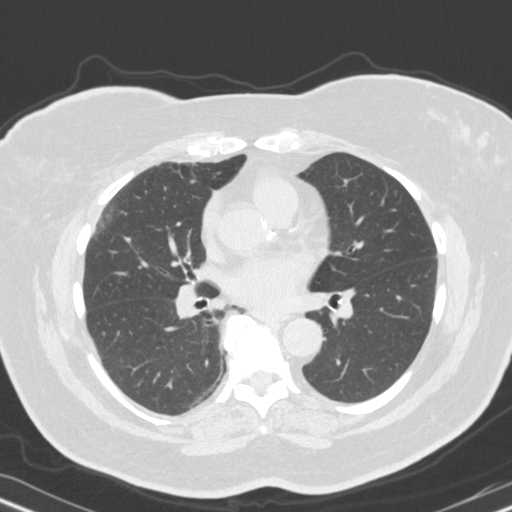
[im 85/159  lung]
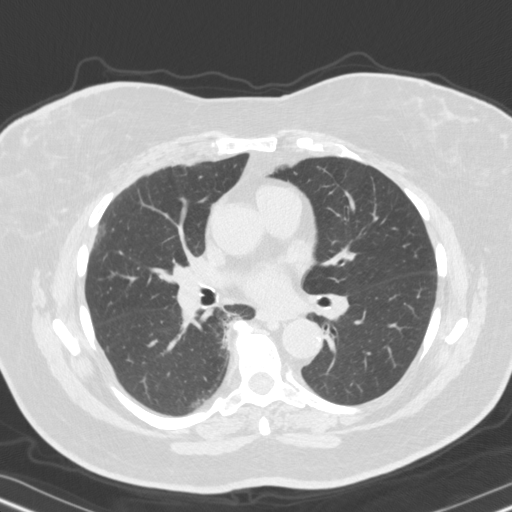
[im 95/159  mediastinal]
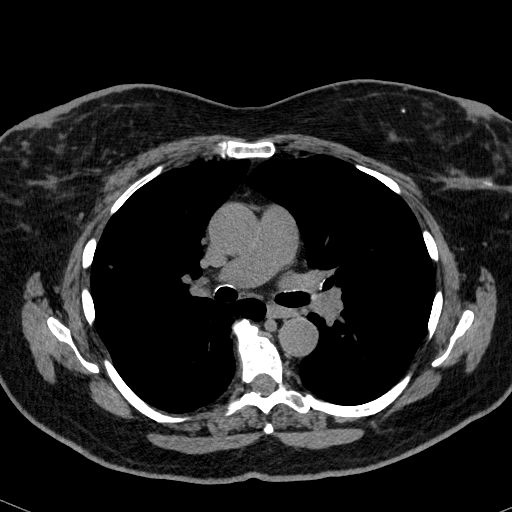
[im 95/159  lung]
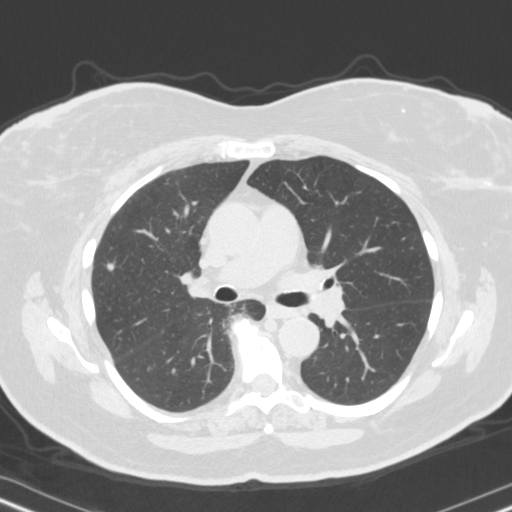
[im 103/159  lung]
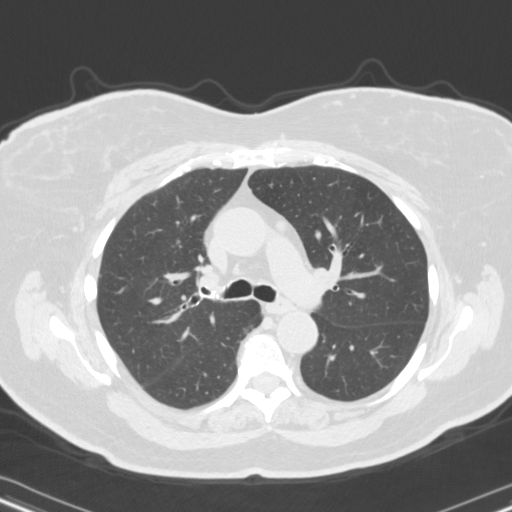
[im 119/159  lung]
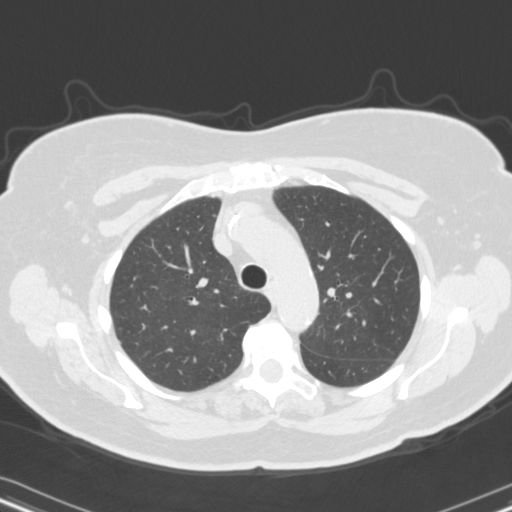
[im 127/159  lung]
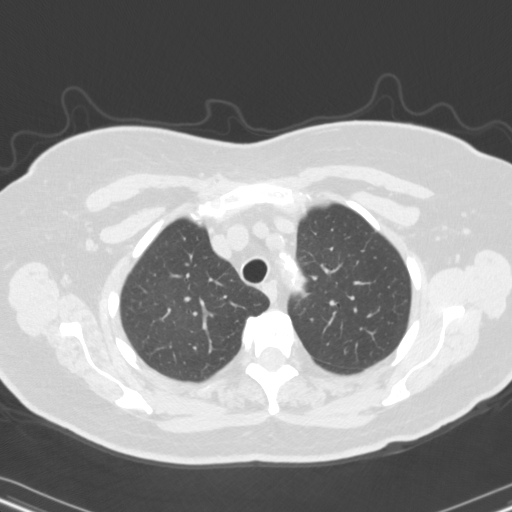
[im 135/159  mediastinal]
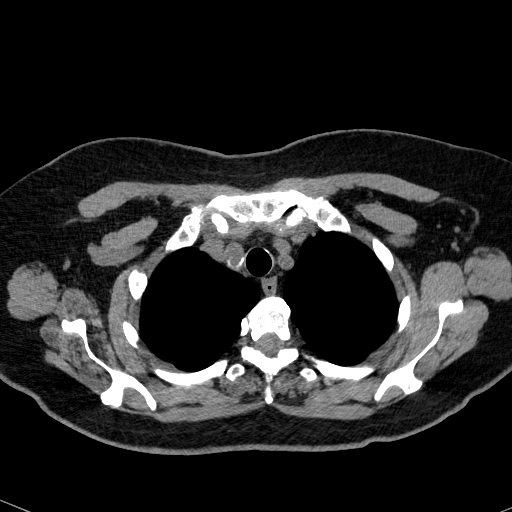
[im 135/159  lung]
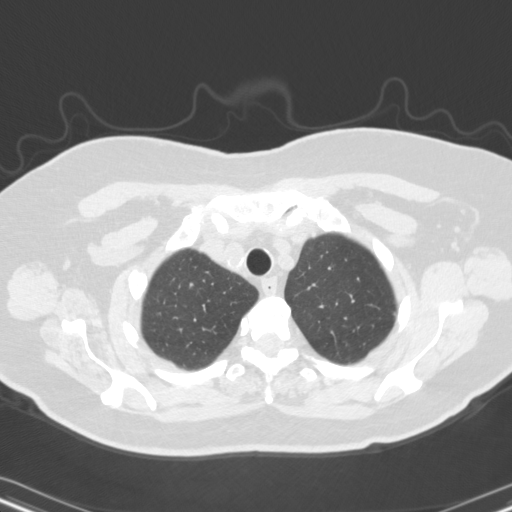
[im 151/159  lung]
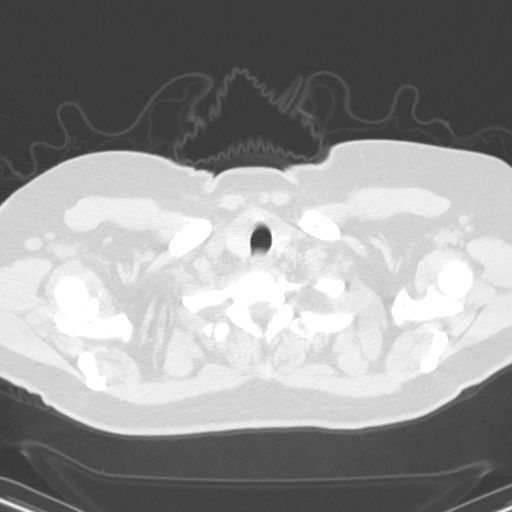

[14 of 30 positions shown; findings below may reference images not displayed]

FINDINGS: Cardiovascular: Normal heart size. No significant pericardial
effusion/thickening. Left anterior descending coronary
atherosclerosis. Atherosclerotic nonaneurysmal thoracic aorta.
Normal caliber pulmonary arteries.

Mediastinum/Nodes: No discrete thyroid nodules. Unremarkable
esophagus. No pathologically enlarged axillary, mediastinal or hilar
lymph nodes, noting limited sensitivity for the detection of hilar
adenopathy on this noncontrast study.

Lungs/Pleura: No pneumothorax. No pleural effusion. No acute
consolidative airspace disease or lung masses. Peripheral basilar
left lower lobe 5 mm solid pulmonary nodule (series 9/image 114) and
basilar right upper lobe 4 mm (series 9/image 65) and 4 mm (series
9/image 73) solid pulmonary nodules are all stable since 03/10/2018
CT, considered benign. No new significant pulmonary nodules. No
significant lobular air trapping or evidence of
tracheobronchomalacia on the expiration sequence. Mild patchy
confluent subpleural reticulation and ground-glass opacity
throughout both lungs with associated minimal traction
bronchiolectasis. There is a basilar predominance to these findings,
which partially persist on prone imaging. No frank honeycombing. No
appreciable interval progression of these findings since 03/10/2018
high-resolution chest CT.

Upper abdomen: Mild diffuse hepatic steatosis.

Musculoskeletal: No aggressive appearing focal osseous lesions.
Moderate thoracic spondylosis.
IMPRESSION: 1. Spectrum of findings compatible with basilar predominant fibrotic
interstitial lung disease without frank honeycombing and without
appreciable interval progression since 03/10/2018 high-resolution
chest CT study. Differential includes NSIP or UIP. Findings are
categorized as probable UIP per consensus guidelines: Diagnosis of
Idiopathic Pulmonary Fibrosis: An Official ATS/ERS/JRS/ALAT Clinical
Practice Guideline. Am J Respir Crit Care Med Vol 198, Raison 5,
ppe77-e[DATE].
2. Scattered small bilateral pulmonary nodules are all stable since
6847, considered benign.
3. One vessel coronary atherosclerosis.
4. Mild diffuse hepatic steatosis.
5. Aortic Atherosclerosis (1PLAT-MM7.7).

## 2021-10-01 ENCOUNTER — Telehealth: Payer: Self-pay | Admitting: Family Medicine

## 2021-10-01 NOTE — Telephone Encounter (Signed)
Spoke with Mrs. [name].  She states she doesn't feel like the losartan is working as well has her previous BP meds.  She states she has swelling under her eyes when she wakes up in the morning and also feels that she has decrease urine output. She feels her BP readings were also better on the diuretic. When ask what her BP reading have been, she states a little high but couldn't give me specific readings. She also states she didn't check her blood pressure over the holidays.  She states she does have 3-1/2 tablets left of her HCTZ and is wondering if she should take one of those.  Please advise.

## 2021-10-01 NOTE — Telephone Encounter (Signed)
Pt called she wanted to discuss her losartan (COZAAR) 25 MG tablet and possible changing medication

## 2021-10-01 NOTE — Telephone Encounter (Signed)
Have her check her BP... if > 140/90.[name] she can add back HCTZ 25 mg daily and continue the losartan... send in refill of HCTZ if needed. Have her make appt for BP check next week.

## 2021-10-01 NOTE — Telephone Encounter (Signed)
Pt called to report her BP readings from today  3pm  167/76 pulse 77 3.07pm   158/77 pulse 76 3:10pm  139/75 pulse 75

## 2021-10-01 NOTE — Telephone Encounter (Signed)
Mrs. Udall notified as instructed by telephone.  Appointment for BP check scheduled on 10/10/2021 at 9:40 am.  Patient is going to check her BP and MyChart me her reading.  If >140/90, I will send in Rx for HCTZ 25 mg.

## 2021-10-01 NOTE — Telephone Encounter (Signed)
Start HCTZ?  Current BP readings:  Diastolic is not >13.  Please advise.

## 2021-10-07 MED ORDER — HYDROCHLOROTHIAZIDE 25 MG PO TABS: 25.0000 mg | ORAL_TABLET | Freq: Every day | ORAL | 1 refills | Status: DC

## 2021-10-07 NOTE — Telephone Encounter (Signed)
Yes start HCTZ 25 mg daily and have her  follow up in 2 weeks for HTN recheck

## 2021-10-07 NOTE — Telephone Encounter (Signed)
Mrs. Riccardi notified as instructed by telephone.  Rx for HCTZ 25 mg sent to [facility] [address]  Patient already has follow up scheduled for 10/10/2021 for BP check with Dr. Diona Browner.

## 2021-10-07 NOTE — Addendum Note (Signed)
Addended by: Carter Kitten on: 10/07/2021 11:18 AM   Modules accepted: Orders

## 2021-10-10 ENCOUNTER — Encounter: Payer: Self-pay | Admitting: Family Medicine

## 2021-10-10 ENCOUNTER — Ambulatory Visit: Payer: Medicare Other | Admitting: Family Medicine

## 2021-10-10 ENCOUNTER — Other Ambulatory Visit: Payer: Self-pay

## 2021-10-10 VITALS — BP 120/60 | HR 85 | Temp 97.5°F | Ht 64.0 in | Wt 219.1 lb

## 2021-10-10 DIAGNOSIS — R5383 Other fatigue: Secondary | ICD-10-CM

## 2021-10-10 DIAGNOSIS — N3281 Overactive bladder: Secondary | ICD-10-CM

## 2021-10-10 DIAGNOSIS — I10 Essential (primary) hypertension: Secondary | ICD-10-CM | POA: Diagnosis not present

## 2021-10-10 DIAGNOSIS — I471 Supraventricular tachycardia: Secondary | ICD-10-CM

## 2021-10-10 DIAGNOSIS — R002 Palpitations: Secondary | ICD-10-CM | POA: Diagnosis not present

## 2021-10-10 NOTE — Patient Instructions (Addendum)
Continue current medication both losartan and HCTZ daily.  Follow blood pressure at home. Goal < 150/90.  Change the bladder medication to bedtime as is can contribute to fatigue.  Start regular exercise to increase energy.  Stop caffeine at dinner, increase water intake in AM.

## 2021-10-30 ENCOUNTER — Other Ambulatory Visit: Payer: Self-pay | Admitting: Family Medicine

## 2021-10-30 ENCOUNTER — Telehealth: Payer: Self-pay | Admitting: *Deleted

## 2021-10-30 MED ORDER — ESTRADIOL 0.1 MG/GM VA CREA: TOPICAL_CREAM | VAGINAL | 12 refills | Status: DC

## 2021-10-30 NOTE — Telephone Encounter (Signed)
Spoke with Mrs. [name].  She states she called her oncologist to ask if she could use estrogen cream for the vaginal itching she has been having.  He told her she could use estrace or estradiol cream as long as she used it only in the vaginal area and no where else.  The oncologist wanted patient's PCP to prescribe.  Please advise.

## 2021-10-30 NOTE — Telephone Encounter (Signed)
Pt called in request a call back  . pt wants to discuss getting a proscription for estrogen cream . Please Advise [phone]

## 2021-10-30 NOTE — Telephone Encounter (Signed)
Received call from pt with complaint of constant vaginal dryness and irritation.  Pt states her PCP would like to prescribe Estrace cream and wanting approval from MD to proceed with tx.  Per MD okay for pt to proceed with Estrace cream vaginally only. Pt verbalized understanding and appreciative of advice.

## 2021-10-30 NOTE — Telephone Encounter (Signed)
Rx for estrogen vaginal cream sent.. reduce to lowest effective dose over time as directed.

## 2021-10-30 NOTE — Telephone Encounter (Signed)
[name] notified as instructed by telephone. Patient states understanding.

## 2021-10-31 ENCOUNTER — Telehealth: Payer: Self-pay | Admitting: Family Medicine

## 2021-10-31 NOTE — Progress Notes (Signed)
Subjective [] Expand by Default  Patient ID: [name], female    DOB: [birth year], 81 y.o.   MRN: [id number]   This visit was conducted in person.   BP 120/60    Pulse 85    Temp (!) 97.5 F (36.4 C) (Temporal)    Ht 5\' 4"  (1.626 m)    Wt 219 lb 2 oz (99.4 kg)    SpO2 96%    BMI 37.61 kg/m     CC:    Chief Complaint  Patient presents with   Blood Pressure Check      Subjective:    HPI: LENETTA PICHE is a 81 y.o. female presenting on 10/10/2021 for Blood Pressure Check   Hypertension:   Was high in last several weeks... restarted HCTZ in addition losartan and asked to follow up.  No Gout and no low sodium in past        BP Readings from Last 3 Encounters:  10/10/21 120/60  09/15/21 140/60  08/21/21 (!) 172/59  Using medication without problems or lightheadedness:  Chest pain with exertion: Edema: Short of breath: Average home BPs: Other issues:        Wt Readings from Last 3 Encounters:  10/10/21 219 lb 2 oz (99.4 kg)  09/15/21 220 lb (99.8 kg)  08/21/21 222 lb 11.2 oz (101 kg)      She is on chronic prednisone     She has been more tired in last few months.    Occ heart pounding at bedtime, not fast or irregular just harder, occ off and on in last 1-2 months... drinks caffeinated tea at dinner. Minimal water intake.  Hx of SVT.  No Chest pain, no SOB no dizziness         Relevant past medical, surgical, family and social history reviewed and updated as indicated. Interim medical history since our last visit reviewed. Allergies and medications reviewed and updated.       Outpatient Medications Prior to Visit  Medication Sig Dispense Refill   acetaminophen (TYLENOL) 500 MG tablet Take 1,000 mg by mouth in the morning and at bedtime.       folic acid (FOLVITE) 1 MG tablet Take 2 mg by mouth daily.       hydrochlorothiazide (HYDRODIURIL) 25 MG tablet Take 1 tablet (25 mg total) by mouth daily. 90 tablet 1   hydroxychloroquine (PLAQUENIL)  200 MG tablet Take 200 mg by mouth 2 (two) times daily.       losartan (COZAAR) 25 MG tablet Take 1 tablet (25 mg total) by mouth daily. 90 tablet 3   predniSONE (DELTASONE) 5 MG tablet Take 5 mg by mouth daily with breakfast.       solifenacin (VESICARE) 5 MG tablet Take 1 tablet by mouth once daily 90 tablet 0   XARELTO 20 MG TABS tablet TAKE 1 TABLET BY MOUTH  DAILY WITH SUPPER 90 tablet 3    No facility-administered medications prior to visit.      Per HPI unless specifically indicated in ROS section below Review of Systems Objective:  BP 120/60    Pulse 85    Temp (!) 97.5 F (36.4 C) (Temporal)    Ht 5\' 4"  (1.626 m)    Wt 219 lb 2 oz (99.4 kg)    SpO2 96%    BMI 37.61 kg/m      Wt Readings from Last 3 Encounters:  10/10/21 219 lb 2 oz (99.4 kg)  09/15/21  220 lb (99.8 kg)  08/21/21 222 lb 11.2 oz (101 kg)      Objective    Physical Exam  BP 120/60    Pulse 85    Temp (!) 97.5 F (36.4 C) (Temporal)    Ht 5\' 4"  (1.626 m)    Wt 219 lb 2 oz (99.4 kg)    SpO2 96%    BMI 37.61 kg/m   General Appearance:    Alert, cooperative, no distress, appears stated age,obese  Head:    Normocephalic, without obvious abnormality, atraumatic  Eyes:    PERRL, conjunctiva/corneas clear, EOM's intact, fundi    benign, both eyes  Ears:    Normal TM's and external ear canals, both ears  Nose:   Nares normal, septum midline, mucosa normal, no drainage    or sinus tenderness  Throat:   Lips, mucosa, and tongue normal; teeth and gums normal  Neck:   Supple, symmetrical, trachea midline, no adenopathy;    thyroid:  no enlargement/tenderness/nodules; no carotid   bruit or JVD     Lungs:     Clear to auscultation bilaterally, respirations unlabored  Chest Wall:    No tenderness or deformity   Heart:    Regular rate and rhythm, S1 and S2 normal, no murmur, rub   or gallop     Abdomen:     Soft, non-tender, bowel sounds active all four quadrants,    no masses, no organomegaly         Extremities:   Extremities normal, atraumatic, no cyanosis or edema  Pulses:   2+ and symmetric all extremities  Skin:   Skin color, texture, turgor normal, no rashes or lesions  Lymph nodes:   Cervical, supraclavicular, and axillary nodes normal  Neurologic:   CNII-XII intact, normal strength, sensation            Results for orders placed or performed in visit on 09/15/21  POC Influenza A&B (Binax test)  Result Value Ref Range    Influenza A, POC Negative Negative    Influenza B, POC Negative Negative  POCT rapid strep A  Result Value Ref Range    Rapid Strep A Screen Positive (A) Negative  POC COVID-19  Result Value Ref Range    SARS Coronavirus 2 Ag Positive (A) Negative      This visit occurred during the SARS-CoV-2 public health emergency.  Safety protocols were in place, including screening questions prior to the visit, additional usage of staff PPE, and extensive cleaning of exam room while observing appropriate contact time as indicated for disinfecting solutions.    COVID 19 screen:  No recent travel or known exposure to COVID19 The patient denies respiratory symptoms of COVID 19 at this time. The importance of social distancing was discussed today.    Assessment and Plan   Problem List Items Addressed This Visit     Essential hypertension, benign - Primary    Continue current medication both losartan and HCTZ daily.  Follow blood pressure at home. Goal < 150/90.      OAB (overactive bladder)    Change to OAB medication to bedtime as it is liekly causing recent worsening of fatigue.      Other fatigue   RESOLVED: Palpitation   PSVT (paroxysmal supraventricular tachycardia) ([place])    Start regular exercise to increase energy.  Stop caffeine at dinner, increase water intake in AM.           Eliezer Lofts, MD

## 2021-10-31 NOTE — Assessment & Plan Note (Signed)
Start regular exercise to increase energy.  Stop caffeine at dinner, increase water intake in AM.

## 2021-10-31 NOTE — Assessment & Plan Note (Signed)
Continue current medication both losartan and HCTZ daily.  Follow blood pressure at home. Goal < 150/90.

## 2021-10-31 NOTE — Telephone Encounter (Signed)
Pt called stating that she had more question about medication estradiol (ESTRACE) 0.1 MG/GM vaginal cream. Pt  is asking for a return call to discuss.

## 2021-10-31 NOTE — Assessment & Plan Note (Signed)
Change to OAB medication to bedtime as it is liekly causing recent worsening of fatigue.

## 2021-11-03 ENCOUNTER — Encounter: Payer: Self-pay | Admitting: Family Medicine

## 2021-11-03 ENCOUNTER — Other Ambulatory Visit: Payer: Self-pay

## 2021-11-03 ENCOUNTER — Ambulatory Visit: Payer: Medicare Other | Admitting: Family Medicine

## 2021-11-03 VITALS — BP 130/69 | HR 96 | Temp 97.3°F | Ht 64.0 in | Wt 222.2 lb

## 2021-11-03 DIAGNOSIS — N898 Other specified noninflammatory disorders of vagina: Secondary | ICD-10-CM | POA: Diagnosis not present

## 2021-11-03 DIAGNOSIS — R32 Unspecified urinary incontinence: Secondary | ICD-10-CM

## 2021-11-03 DIAGNOSIS — R3915 Urgency of urination: Secondary | ICD-10-CM

## 2021-11-03 LAB — POC URINALSYSI DIPSTICK (AUTOMATED)
Bilirubin, UA: NEGATIVE
Blood, UA: NEGATIVE
Glucose, UA: NEGATIVE
Ketones, UA: NEGATIVE
Leukocytes, UA: NEGATIVE
Nitrite, UA: NEGATIVE
Protein, UA: NEGATIVE
Spec Grav, UA: 1.025 (ref 1.010–1.025)
Urobilinogen, UA: 0.2 E.U./dL
pH, UA: 6 (ref 5.0–8.0)

## 2021-11-03 LAB — POCT UA - MICROSCOPIC ONLY
Casts, Ur, LPF, POC: 0
Crystals, Ur, HPF, POC: 0
RBC, Urine, Miroscopic: 0 (ref 0–2)
Yeast, UA: 0

## 2021-11-03 NOTE — Telephone Encounter (Signed)
Agreed -

## 2021-11-03 NOTE — Telephone Encounter (Signed)
I will see her then  

## 2021-11-03 NOTE — Telephone Encounter (Signed)
Left message for Mrs. Tallman to return my call.

## 2021-11-03 NOTE — Patient Instructions (Addendum)
Drink lots of water   Try starting back on vesicare -this may help the urge incontinence symptoms  Do your kegels   Continue the estrogen cream   We will send your urine for a culture and call you with a result and plan   If symptoms suddenly worsen let us know

## 2021-11-03 NOTE — Assessment & Plan Note (Signed)
Pt has started using estrogen cream around the urethra

## 2021-11-03 NOTE — Telephone Encounter (Signed)
Spoke with Mrs. [name].  She states the instructions on the Estrace cream states to to intravaginally so she was asking if she was suppose to be doing that because she has just been applying it to the labia area where the itching is.  I advised to just continue applying like she is doing.  She also states that she did a home UTI test and it was positive.  I advised she would need an appointment in order to be treated.  Appointment scheduled this afternoon at 2:30 pm with Dr. Glori Bickers. FYI to Dr. Diona Browner and Dr. Glori Bickers.

## 2021-11-03 NOTE — Progress Notes (Signed)
Subjective:    Patient ID: [name], female    DOB: 23-Feb-1941, 81 y.o.   MRN: [id number]  This visit occurred during the SARS-CoV-2 public health emergency.  Safety protocols were in place, including screening questions prior to the visit, additional usage of staff PPE, and extensive cleaning of exam room while observing appropriate contact time as indicated for disinfecting solutions.   HPI 81 yo pt of Dr Diona Browner presents with urinary symptoms     Wt Readings from Last 3 Encounters:  11/03/21 222 lb 4 oz (100.8 kg)  10/10/21 219 lb 2 oz (99.4 kg)  09/15/21 220 lb (99.8 kg)   38.15 kg/m   Urinating a lot  Not able to hold it well  Did an azo test and it was positive   Worse than usual for a week   No pain to urinate  A little bit of urine odor  No blood  Baseline back pain-nothing new   No nausea  No fever   She does not have a h/o frequent uti   Urgency - leaks if she does not move fast enough   Using some estrogen vaginal cream Using it for an itch  Not long enough to see if it works   She stopped taking her vesicare- just was not helping  2 weeks ago   Results for orders placed or performed in visit on 11/03/21  POCT Urinalysis Dipstick (Automated)  Result Value Ref Range   Color, UA Yellow    Clarity, UA Hazy    Glucose, UA Negative Negative   Bilirubin, UA Negative    Ketones, UA Negative    Spec Grav, UA 1.025 1.010 - 1.025   Blood, UA Negative    pH, UA 6.0 5.0 - 8.0   Protein, UA Negative Negative   Urobilinogen, UA 0.2 0.2 or 1.0 E.U./dL   Nitrite, UA Negative    Leukocytes, UA Negative Negative  POCT UA - Microscopic Only  Result Value Ref Range   WBC, Ur, HPF, POC 2-3 0 - 5   RBC, Urine, Miroscopic 0 0 - 2   Bacteria, U Microscopic few None - Trace   Mucus, UA few    Epithelial cells, urine per micros few    Crystals, Ur, HPF, POC 0    Casts, Ur, LPF, POC 0    Yeast, UA 0     A little concentrated Drinks some water   Some tea   BP Readings from Last 3 Encounters:  11/03/21 130/69  10/10/21 120/60  09/15/21 140/60   Patient Active Problem List   Diagnosis Date Noted   Urinary urgency 11/03/2021   Right hip pain 07/25/2021   Abnormal x-ray of lumbar spine 07/25/2021   Acute cystitis without hematuria 06/05/2021   Vaginal irritation 05/20/2021   OAB (overactive bladder) 05/20/2021   Burning with urination 05/20/2021   Hypokalemia 01/01/2021   Anemia 01/01/2021   Acute respiratory failure with hypoxia ([employer]) 10/31/2020   Post-menopausal bleeding 07/08/2020   History of pulmonary embolism 04/01/2020   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   Other fatigue 11/24/2018   ILD (interstitial lung disease) ([place]) 11/24/2018   Pulmonary nodule 11/24/2018   Chronic diarrhea 05/11/2017   Rheumatoid arthritis flare ([place]) 01/24/2016   Counseling regarding end of life decision making 09/11/2015   Acute left-sided low back pain without sciatica 09/11/2015   Severe obesity with body mass index (BMI) of 36.0 to 36.9 with serious comorbidity ([place]) 10/12/2012  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/12/2012   PSVT (paroxysmal supraventricular tachycardia) ([place]) 02/15/2012   Dizziness 06/13/2008   Prediabetes 01/10/2008   Hyperlipidemia 01/04/2008   Essential hypertension, benign 01/04/2008   GERD 01/04/2008   OSTEOARTHRITIS 01/04/2008   SUPERFICIAL THROMBOPHLEBITIS 10/05/1988   Past Medical History:  Diagnosis Date   Cancer [facility])    breast   Cataract    diagnosed by Dr. Phineas Douglas   Dysrhythmia    PSVT/ palpitations-  controlled with prn Inderal   GERD (gastroesophageal reflux disease)    30 years ago.    Hyperlipidemia    statin intolerant   Hypertension    eccho 4/13 EPIC   Migraines    Obesity    Osteoarthritis    Personal history of radiation therapy    right breast   Phlebitis of leg, right, superficial 1986/1989   x 2   Past Surgical History:  Procedure Laterality Date   BREAST BIOPSY  Left 07/05/2019   BREAST BIOPSY Bilateral 08/24/2019   BREAST EXCISIONAL BIOPSY Left 09/20/2019   BREAST LUMPECTOMY Right 09/20/2019   BREAST LUMPECTOMY WITH RADIOACTIVE SEED LOCALIZATION Bilateral 09/20/2019   Procedure: BILATERAL BREAST LUMPECTOMIES WITH BILATERAL RADIOACTIVE SEED LOCALIZATION AND RIGHT NIPPLE BIOPSY;  Surgeon: Erroll Luna, MD;  Location: [place];  Service: General;  Laterality: Bilateral;   CARDIAC CATHETERIZATION  1990s   negative   CARDIOVASCULAR STRESS TEST  2008   NML   CARDIOVASCULAR STRESS TEST  02/02/2007   EF 70%, NO ISCHEMIA   DILATION AND CURETTAGE, DIAGNOSTIC / THERAPEUTIC  2008   LESION DESTRUCTION  10/2017   Face; Dr. Ronnald Ramp   ROTATOR CUFF REPAIR  1999   Right, left in 2002   TOTAL KNEE ARTHROPLASTY  2008   Left   TOTAL KNEE ARTHROPLASTY  10/11/2012   Procedure: TOTAL KNEE ARTHROPLASTY;  Surgeon: Mauri Pole, MD;  Location: [facility];  Service: Orthopedics;  Laterality: Right;   US ECHOCARDIOGRAPHY  02/07/2007   EF 55-60%   Social History   Tobacco Use   Smoking status: Former    Packs/day: 0.50    Years: 10.00    Pack years: 5.00    Types: Cigarettes    Start date: 95    Quit date: 10/06/1963    Years since quitting: 58.1   Smokeless tobacco: Never  Vaping Use   Vaping Use: Never used  Substance Use Topics   Alcohol use: No    Comment: stopped 5 years ago   Drug use: No   Family History  Problem Relation Age of Onset   Heart attack Father 32       PUD   Hypertension Father    Lung cancer Brother    Heart defect Sister    Hypertension Mother    Coronary artery disease Other        Female 1st degree relative <50   Allergies  Allergen Reactions   Codeine Nausea And Vomiting   Hydrocodone Bit-Homatrop Mbr Other (See Comments)    Hallucinations    Current Outpatient Medications on File Prior to Visit  Medication Sig Dispense Refill   acetaminophen (TYLENOL) 500 MG tablet Take 1,000 mg by mouth in the morning and  at bedtime.     estradiol (ESTRACE) 0.1 MG/GM vaginal cream 0.5 g of cream intravaginally administered daily for 2 weeks, then reduce to twice weekly 02.7 g 12   folic acid (FOLVITE) 1 MG tablet Take 2 mg by mouth daily.     hydrochlorothiazide (HYDRODIURIL) 25  MG tablet Take 1 tablet (25 mg total) by mouth daily. 90 tablet 1   hydroxychloroquine (PLAQUENIL) 200 MG tablet Take 200 mg by mouth 2 (two) times daily.     losartan (COZAAR) 25 MG tablet Take 1 tablet (25 mg total) by mouth daily. 90 tablet 3   predniSONE (DELTASONE) 5 MG tablet Take 5 mg by mouth daily with breakfast.     XARELTO 20 MG TABS tablet TAKE 1 TABLET BY MOUTH  DAILY WITH SUPPER 90 tablet 3   No current facility-administered medications on file prior to visit.     Review of Systems  Constitutional:  Negative for activity change, appetite change, fatigue, fever and unexpected weight change.  HENT:  Negative for congestion, ear pain, rhinorrhea, sinus pressure and sore throat.   Eyes:  Negative for pain, redness and visual disturbance.  Respiratory:  Negative for cough, shortness of breath and wheezing.   Cardiovascular:  Negative for chest pain and palpitations.  Gastrointestinal:  Negative for abdominal pain, blood in stool, constipation and diarrhea.  Endocrine: Negative for polydipsia and polyuria.  Genitourinary:  Positive for frequency and urgency. Negative for decreased urine volume, dysuria, pelvic pain and vaginal discharge.       Some chronic vaginal itcing  Musculoskeletal:  Negative for arthralgias, back pain and myalgias.  Skin:  Negative for pallor and rash.  Allergic/Immunologic: Negative for environmental allergies.  Neurological:  Negative for dizziness, syncope and headaches.  Hematological:  Negative for adenopathy. Does not bruise/bleed easily.  Psychiatric/Behavioral:  Negative for decreased concentration and dysphoric mood. The patient is not nervous/anxious.       Objective:   Physical  Exam Constitutional:      General: She is not in acute distress.    Appearance: Normal appearance. She is well-developed. She is obese. She is not ill-appearing or diaphoretic.  HENT:     Head: Normocephalic and atraumatic.  Eyes:     Conjunctiva/sclera: Conjunctivae normal.     Pupils: Pupils are equal, round, and reactive to light.  Cardiovascular:     Rate and Rhythm: Normal rate and regular rhythm.     Heart sounds: Normal heart sounds.  Pulmonary:     Effort: Pulmonary effort is normal.     Breath sounds: Normal breath sounds.  Abdominal:     General: Abdomen is protuberant. Bowel sounds are normal. There is no distension.     Palpations: Abdomen is soft. There is no hepatomegaly, splenomegaly or pulsatile mass.     Tenderness: There is abdominal tenderness. There is no right CVA tenderness, left CVA tenderness, guarding or rebound.     Comments: No cva tenderness  No suprapubic tenderness or fullness    Musculoskeletal:     Cervical back: Normal range of motion and neck supple.  Lymphadenopathy:     Cervical: No cervical adenopathy.  Skin:    Findings: No rash.  Neurological:     Mental Status: She is alert.  Psychiatric:        Mood and Affect: Mood normal.          Assessment & Plan:   Problem List Items Addressed This Visit       Other   Vaginal irritation    Pt has started using estrogen cream around the urethra       Urinary urgency - Primary    Urine is fairly clear/waiting on culture  Suspect that urgency may be rebound from coming off of vesicare (plans to re try it because  she is unsure if she had side effects)  Encouraged water intake  Watch for dysuria or worse symptoms Update if not starting to improve in a week or if worsening   ER precautions noted  Pending urine culture result       Relevant Orders   Urine Culture   POCT UA - Microscopic Only (Completed)   Other Visit Diagnoses     Urinary incontinence, unspecified type        Relevant Orders   POCT Urinalysis Dipstick (Automated) (Completed)

## 2021-11-03 NOTE — Assessment & Plan Note (Signed)
Urine is fairly clear/waiting on culture  Suspect that urgency may be rebound from coming off of vesicare (plans to re try it because she is unsure if she had side effects)  Encouraged water intake  Watch for dysuria or worse symptoms Update if not starting to improve in a week or if worsening   ER precautions noted  Pending urine culture result

## 2021-11-04 LAB — URINE CULTURE
MICRO NUMBER:: 12937170
SPECIMEN QUALITY:: ADEQUATE

## 2021-11-05 ENCOUNTER — Ambulatory Visit: Payer: Medicare Other | Admitting: Physical Medicine and Rehabilitation

## 2021-11-19 ENCOUNTER — Telehealth: Payer: Medicare Other | Admitting: Family

## 2021-11-19 DIAGNOSIS — R6889 Other general symptoms and signs: Secondary | ICD-10-CM

## 2021-11-19 MED ORDER — PREDNISONE 5 MG PO TABS: 5.0000 mg | ORAL_TABLET | Freq: Every day | ORAL | 1 refills | Status: DC

## 2021-11-19 MED ORDER — OSELTAMIVIR PHOSPHATE 75 MG PO CAPS: 75.0000 mg | ORAL_CAPSULE | Freq: Two times a day (BID) | ORAL | 0 refills | Status: DC

## 2021-11-19 NOTE — Progress Notes (Signed)
Virtual Visit Consent   [name], you are scheduled for a virtual visit with a [facility] provider today.     Just as with appointments in the office, your consent must be obtained to participate.  Your consent will be active for this visit and any virtual visit you may have with one of our providers in the next 365 days.     If you have a MyChart account, a copy of this consent can be sent to you electronically.  All virtual visits are billed to your insurance company just like a traditional visit in the office.    As this is a virtual visit, video technology does not allow for your provider to perform a traditional examination.  This may limit your provider's ability to fully assess your condition.  If your provider identifies any concerns that need to be evaluated in person or the need to arrange testing (such as labs, EKG, etc.), we will make arrangements to do so.     Although advances in technology are sophisticated, we cannot ensure that it will always work on either your end or our end.  If the connection with a video visit is poor, the visit may have to be switched to a telephone visit.  With either a video or telephone visit, we are not always able to ensure that we have a secure connection.     I need to obtain your verbal consent now.   Are you willing to proceed with your visit today?    [name] has provided verbal consent on 11/19/2021 for a virtual visit (video or telephone).   Evelina Dun, FNP   Date: 11/19/2021 11:06 AM   Virtual Visit via Video Note   I, Evelina Dun, connected with  [name]  ([id number], 09-06-1941) on 11/19/21 at 11:15 AM EST by a video-enabled telemedicine application and verified that I am speaking with the correct person using two identifiers.  Location: Patient: Virtual Visit Location Patient: Home Provider: Virtual Visit Location Provider: Home Office   I discussed the limitations of evaluation and  management by telemedicine and the availability of in person appointments. The patient expressed understanding and agreed to proceed.    History of Present Illness: [name] is a 81 y.o. who identifies as a female who was assigned female at birth, and is being seen today for flu like symptoms with congestion and sinus pressure that started Monday night and has worsen. Have taken 3 COVID tests that have been negative.   HPI: Cough This is a new problem. The current episode started in the past 7 days. The problem has been gradually worsening. The problem occurs every few minutes. The cough is Productive of sputum. Associated symptoms include ear congestion (right), ear pain, headaches, myalgias, nasal congestion, postnasal drip, a sore throat and wheezing. Pertinent negatives include no chills, fever or shortness of breath. She has tried rest and OTC cough suppressant for the symptoms. The treatment provided mild relief.   Problems:  Patient Active Problem List   Diagnosis Date Noted   Urinary urgency 11/03/2021   Right hip pain 07/25/2021   Abnormal x-ray of lumbar spine 07/25/2021   Acute cystitis without hematuria 06/05/2021   Vaginal irritation 05/20/2021   OAB (overactive bladder) 05/20/2021   Burning with urination 05/20/2021   Hypokalemia 01/01/2021   Anemia 01/01/2021   Acute respiratory failure with hypoxia ([place]) 10/31/2020   Post-menopausal bleeding 07/08/2020   History of pulmonary embolism 04/01/2020  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   Other fatigue 11/24/2018   ILD (interstitial lung disease) ([place]) 11/24/2018   Pulmonary nodule 11/24/2018   Chronic diarrhea 05/11/2017   Rheumatoid arthritis flare ([place]) 01/24/2016   Counseling regarding end of life decision making 09/11/2015   Acute left-sided low back pain without sciatica 09/11/2015   Severe obesity with body mass index (BMI) of 36.0 to 36.9 with serious comorbidity ([place]) 10/12/2012    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/12/2012   PSVT (paroxysmal supraventricular tachycardia) ([place]) 02/15/2012   Dizziness 06/13/2008   Prediabetes 01/10/2008   Hyperlipidemia 01/04/2008   Essential hypertension, benign 01/04/2008   GERD 01/04/2008   OSTEOARTHRITIS 01/04/2008   SUPERFICIAL THROMBOPHLEBITIS 10/05/1988    Allergies:  Allergies  Allergen Reactions   Codeine Nausea And Vomiting   Hydrocodone Bit-Homatrop Mbr Other (See Comments)    Hallucinations    Medications:  Current Outpatient Medications:    oseltamivir (TAMIFLU) 75 MG capsule, Take 1 capsule (75 mg total) by mouth 2 (two) times daily., Disp: 10 capsule, Rfl: 0   acetaminophen (TYLENOL) 500 MG tablet, Take 1,000 mg by mouth in the morning and at bedtime., Disp: , Rfl:    estradiol (ESTRACE) 0.1 MG/GM vaginal cream, 0.5 g of cream intravaginally administered daily for 2 weeks, then reduce to twice weekly, Disp: 84.1 g, Rfl: 12   folic acid (FOLVITE) 1 MG tablet, Take 2 mg by mouth daily., Disp: , Rfl:    hydrochlorothiazide (HYDRODIURIL) 25 MG tablet, Take 1 tablet (25 mg total) by mouth daily., Disp: 90 tablet, Rfl: 1   hydroxychloroquine (PLAQUENIL) 200 MG tablet, Take 200 mg by mouth 2 (two) times daily., Disp: , Rfl:    losartan (COZAAR) 25 MG tablet, Take 1 tablet (25 mg total) by mouth daily., Disp: 90 tablet, Rfl: 3   predniSONE (DELTASONE) 5 MG tablet, Take 1 tablet (5 mg total) by mouth daily with breakfast., Disp: 90 tablet, Rfl: 1   XARELTO 20 MG TABS tablet, TAKE 1 TABLET BY MOUTH  DAILY WITH SUPPER, Disp: 90 tablet, Rfl: 3  Observations/Objective: Patient is well-developed, well-nourished in no acute distress.  Resting comfortably  at home.  Head is normocephalic, atraumatic.  No labored breathing.  Speech is clear and coherent with logical content.  Patient is alert and oriented at baseline.  Nasal congestion  Assessment and Plan: 1. Flu-like symptoms - oseltamivir (TAMIFLU) 75 MG capsule; Take 1 capsule (75 mg  total) by mouth 2 (two) times daily.  Dispense: 10 capsule; Refill: 0  - Take meds as prescribed - Use a cool mist humidifier  -Use saline nose sprays frequently -Force fluids -For any cough or congestion  Use plain Mucinex- regular strength or max strength is fine -For fever or aces or pains- take tylenol or ibuprofen. -Throat lozenges if help -Follow up to be seen in person if symptoms worsen or do not improve   Follow Up Instructions: I discussed the assessment and treatment plan with the patient. The patient was provided an opportunity to ask questions and all were answered. The patient agreed with the plan and demonstrated an understanding of the instructions.  A copy of instructions were sent to the patient via MyChart unless otherwise noted below.     The patient was advised to call back or seek an in-person evaluation if the symptoms worsen or if the condition fails to improve as anticipated.  Time:  I spent 9 minutes with the patient via telehealth technology discussing the above  problems/concerns.    Evelina Dun, FNP

## 2021-11-21 ENCOUNTER — Telehealth (INDEPENDENT_AMBULATORY_CARE_PROVIDER_SITE_OTHER): Payer: Medicare Other | Admitting: Family Medicine

## 2021-11-21 ENCOUNTER — Other Ambulatory Visit: Payer: Self-pay

## 2021-11-21 DIAGNOSIS — D849 Immunodeficiency, unspecified: Secondary | ICD-10-CM

## 2021-11-21 DIAGNOSIS — R051 Acute cough: Secondary | ICD-10-CM

## 2021-11-21 DIAGNOSIS — J849 Interstitial pulmonary disease, unspecified: Secondary | ICD-10-CM | POA: Diagnosis not present

## 2021-11-21 MED ORDER — DOXYCYCLINE HYCLATE 100 MG PO TABS: 100.0000 mg | ORAL_TABLET | Freq: Two times a day (BID) | ORAL | 0 refills | Status: DC

## 2021-11-21 NOTE — Progress Notes (Signed)
VIRTUAL VISIT Due to national recommendations of social distancing due to [place] 19, a virtual visit is felt to be most appropriate for this patient at this time.   I connected with the patient on 11/21/21 at  4:00 PM EST by virtual telehealth platform and verified that I am speaking with the correct person using two identifiers.   I discussed the limitations, risks, security and privacy concerns of performing an evaluation and management service by  virtual telehealth platform and the availability of in person appointments. I also discussed with the patient that there may be a patient responsible charge related to this service. The patient expressed understanding and agreed to proceed.  Patient location: Home Provider Location: [facility] Participants: Eliezer Lofts and [name]   Chief Complaint  Patient presents with   Cough    Productive X 5 days. Green mucous     History of Present Illness: 81 year old female patient with history of HTN and ILD, on plaquenil on RA, presents with productive cough  Date of onset: 2/14 Cough productive... worsening cough.  Stable shortness of breath, no wheeze. She feels very tired.  No ear pain, no face pain. Has headache. Chest ache, body ache lost taste and smell... now improved. No fever.   2/15 Did virtual visit: felt likely flu like symptoms.Marland Kitchen tamiflu   COVID 19 screen COVID testing: COVID test neg x 3 COVID vaccine: COVID exposure: No recent travel or known exposure to COVID19  The importance of social distancing was discussed today.    Review of Systems  Constitutional:  Positive for malaise/fatigue. Negative for chills and fever.  HENT:  Positive for congestion. Negative for ear pain.   Eyes:  Negative for pain and redness.  Respiratory:  Positive for cough, sputum production and shortness of breath. Negative for hemoptysis and wheezing.   Cardiovascular:  Negative for chest pain, palpitations and leg swelling.   Gastrointestinal:  Negative for abdominal pain, blood in stool, constipation, diarrhea, nausea and vomiting.  Genitourinary:  Negative for dysuria.  Musculoskeletal:  Negative for falls and myalgias.  Skin:  Negative for rash.  Neurological:  Negative for dizziness.  Psychiatric/Behavioral:  Negative for depression. The patient is not nervous/anxious.      Past Medical History:  Diagnosis Date   Cancer [facility])    breast   Cataract    diagnosed by Dr. Phineas Douglas   Dysrhythmia    PSVT/ palpitations-  controlled with prn Inderal   GERD (gastroesophageal reflux disease)    30 years ago.    Hyperlipidemia    statin intolerant   Hypertension    eccho 4/13 EPIC   Migraines    Obesity    Osteoarthritis    Personal history of radiation therapy    right breast   Phlebitis of leg, right, superficial 1986/1989   x 2    reports that she quit smoking about 58 years ago. Her smoking use included cigarettes. She started smoking about 69 years ago. She has a 5.00 pack-year smoking history. She has never used smokeless tobacco. She reports that she does not drink alcohol and does not use drugs.   Current Outpatient Medications:    acetaminophen (TYLENOL) 500 MG tablet, Take 1,000 mg by mouth in the morning and at bedtime., Disp: , Rfl:    benzonatate (TESSALON) 200 MG capsule, Take 200 mg by mouth 2 (two) times daily as needed for cough., Disp: , Rfl:    estradiol (ESTRACE) 0.1 MG/GM vaginal cream,  0.5 g of cream intravaginally administered daily for 2 weeks, then reduce to twice weekly, Disp: 09.7 g, Rfl: 12   folic acid (FOLVITE) 1 MG tablet, Take 2 mg by mouth daily., Disp: , Rfl:    hydrochlorothiazide (HYDRODIURIL) 25 MG tablet, Take 1 tablet (25 mg total) by mouth daily., Disp: 90 tablet, Rfl: 1   hydroxychloroquine (PLAQUENIL) 200 MG tablet, Take 200 mg by mouth 2 (two) times daily., Disp: , Rfl:    losartan (COZAAR) 25 MG tablet, Take 1 tablet (25 mg total) by mouth daily., Disp: 90 tablet,  Rfl: 3   oseltamivir (TAMIFLU) 75 MG capsule, Take 1 capsule (75 mg total) by mouth 2 (two) times daily., Disp: 10 capsule, Rfl: 0   XARELTO 20 MG TABS tablet, TAKE 1 TABLET BY MOUTH  DAILY WITH SUPPER, Disp: 90 tablet, Rfl: 3   predniSONE (DELTASONE) 5 MG tablet, Take 1 tablet (5 mg total) by mouth daily with breakfast. (Patient not taking: Reported on 11/21/2021), Disp: 90 tablet, Rfl: 1   Observations/Objective: There were no vitals taken for this visit.  Physical Exam  Physical Exam Constitutional:      General: The patient is not in acute distress. Pulmonary:     Effort: Pulmonary effort is normal. No respiratory distress.  Neurological:     Mental Status: The patient is alert and oriented to person, place, and time.  Psychiatric:        Mood and Affect: Mood normal.        Behavior: Behavior normal.   Assessment and Plan Problem List Items Addressed This Visit     Acute cough - Primary    Possible flu now with worsening symptoms... given ILD, age and minimal improvement with  emperic flu treatment will broaden treatment to cover bacterial infection.  Treat with doxycycline and symptomatic care.  ER precautions reviewed.      ILD (interstitial lung disease) ([place])   Other Visit Diagnoses     Immunocompromised ([place])          Meds ordered this encounter  Medications   doxycycline (VIBRA-TABS) 100 MG tablet    Sig: Take 1 tablet (100 mg total) by mouth 2 (two) times daily.    Dispense:  20 tablet    Refill:  0      I discussed the assessment and treatment plan with the patient. The patient was provided an opportunity to ask questions and all were answered. The patient agreed with the plan and demonstrated an understanding of the instructions.   The patient was advised to call back or seek an in-person evaluation if the symptoms worsen or if the condition fails to improve as anticipated.     Eliezer Lofts, MD

## 2021-11-21 NOTE — Assessment & Plan Note (Signed)
Possible flu now with worsening symptoms... given ILD, age and minimal improvement with  emperic flu treatment will broaden treatment to cover bacterial infection.  Treat with doxycycline and symptomatic care.  ER precautions reviewed.

## 2021-11-22 ENCOUNTER — Telehealth: Payer: Self-pay | Admitting: Family Medicine

## 2021-11-22 MED ORDER — DOXYCYCLINE HYCLATE 100 MG PO TABS: 100.0000 mg | ORAL_TABLET | Freq: Two times a day (BID) | ORAL | 0 refills | Status: DC

## 2021-11-22 NOTE — Telephone Encounter (Signed)
8:22 AM Answering service call.  Abx sent in yesterday - requests different pharmacy. Changed to [address] - new rx sent for doxycycline.

## 2021-11-23 ENCOUNTER — Encounter: Payer: Self-pay | Admitting: Emergency Medicine

## 2021-11-23 ENCOUNTER — Ambulatory Visit
Admission: EM | Admit: 2021-11-23 | Discharge: 2021-11-23 | Disposition: A | Discharge status: Home / Self Care | Payer: Medicare Other | Attending: Physician Assistant | Admitting: Physician Assistant

## 2021-11-23 ENCOUNTER — Ambulatory Visit (INDEPENDENT_AMBULATORY_CARE_PROVIDER_SITE_OTHER): Payer: Medicare Other

## 2021-11-23 ENCOUNTER — Other Ambulatory Visit: Payer: Self-pay

## 2021-11-23 DIAGNOSIS — R059 Cough, unspecified: Secondary | ICD-10-CM

## 2021-11-23 DIAGNOSIS — J8410 Pulmonary fibrosis, unspecified: Secondary | ICD-10-CM | POA: Diagnosis not present

## 2021-11-23 DIAGNOSIS — J069 Acute upper respiratory infection, unspecified: Secondary | ICD-10-CM

## 2021-11-23 HISTORY — DX: Pulmonary fibrosis, unspecified: J84.10

## 2021-11-23 HISTORY — DX: Rheumatoid arthritis, unspecified: M06.9

## 2021-11-23 NOTE — ED Triage Notes (Addendum)
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

## 2021-11-23 NOTE — ED Provider Notes (Signed)
[facility]    CSN: [id number] Arrival date & time: 11/23/21  1017      History   Chief Complaint Chief Complaint  Patient presents with   Cough    HPI [name] is a 81 y.o. female.   Patient here today for evaluation of increased cough and wheeze she has had for the last week.  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  She has called her provider during the week and was prescribed Tamiflu and doxycycline but has not noted any significant improvement with this.  She has taken a home COVID test that was negative and did have COVID 2 months ago but admits to losing her taste and smell over the last few days.  Her husband is sick as well at home with cough.  The history is provided by the patient.  Cough Associated symptoms: shortness of breath   Associated symptoms: no chills, no ear pain, no eye discharge, no fever and no sore throat    Past Medical History:  Diagnosis Date   Cancer [facility])    breast   Cataract    diagnosed by Dr. Phineas Douglas   Dysrhythmia    PSVT/ palpitations-  controlled with prn Inderal   GERD (gastroesophageal reflux disease)    30 years ago.    Hyperlipidemia    statin intolerant   Hypertension    eccho 4/13 EPIC   Migraines    Obesity    Osteoarthritis    Personal history of radiation therapy    right breast   Phlebitis of leg, right, superficial 1986/1989   x 2   Pulmonary fibrosis (HCC)    RA (rheumatoid arthritis) ([place])     Patient Active Problem List   Diagnosis Date Noted   Acute cough 11/21/2021   Urinary urgency 11/03/2021   Right hip pain 07/25/2021   Abnormal x-ray of lumbar spine 07/25/2021   Acute cystitis without hematuria 06/05/2021   Vaginal irritation 05/20/2021   OAB (overactive bladder) 05/20/2021   Burning with urination 05/20/2021   Hypokalemia 01/01/2021   Anemia 01/01/2021   Acute respiratory  failure with hypoxia ([place]) 10/31/2020   Post-menopausal bleeding 07/08/2020   History of pulmonary embolism 04/01/2020   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   Other fatigue 11/24/2018   ILD (interstitial lung disease) ([place]) 11/24/2018   Pulmonary nodule 11/24/2018   Chronic diarrhea 05/11/2017   Rheumatoid arthritis flare ([place]) 01/24/2016   Counseling regarding end of life decision making 09/11/2015   Acute left-sided low back pain without sciatica 09/11/2015   Severe obesity with body mass index (BMI) of 36.0 to 36.9 with serious comorbidity ([place]) 10/12/2012   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/12/2012   PSVT (paroxysmal supraventricular tachycardia) ([place]) 02/15/2012   Dizziness 06/13/2008   Prediabetes 01/10/2008   Hyperlipidemia 01/04/2008   Essential hypertension, benign 01/04/2008   GERD 01/04/2008   OSTEOARTHRITIS 01/04/2008   SUPERFICIAL THROMBOPHLEBITIS 10/05/1988    Past Surgical History:  Procedure Laterality Date   BREAST BIOPSY Left 07/05/2019   BREAST BIOPSY Bilateral 08/24/2019   BREAST EXCISIONAL BIOPSY Left 09/20/2019   BREAST LUMPECTOMY Right 09/20/2019   BREAST LUMPECTOMY WITH RADIOACTIVE SEED LOCALIZATION Bilateral 09/20/2019   Procedure: BILATERAL BREAST LUMPECTOMIES WITH BILATERAL RADIOACTIVE SEED LOCALIZATION AND RIGHT NIPPLE BIOPSY;  Surgeon: Erroll Luna, MD;  Location: [facility];  Service: General;  Laterality: Bilateral;   CARDIAC CATHETERIZATION  1990s   negative   CARDIOVASCULAR STRESS TEST  2008   NML   CARDIOVASCULAR STRESS TEST  02/02/2007   EF 70%, NO ISCHEMIA   DILATION AND CURETTAGE, DIAGNOSTIC / THERAPEUTIC  2008   LESION DESTRUCTION  10/2017   Face; Dr. Lindwood Qua CUFF REPAIR  1999   Right, left in 2002   TOTAL KNEE ARTHROPLASTY  2008   Left   TOTAL KNEE ARTHROPLASTY  10/11/2012   Procedure: TOTAL KNEE ARTHROPLASTY;  Surgeon: Mauri Pole, MD;  Location: [facility];  Service: Orthopedics;  Laterality: Right;    US ECHOCARDIOGRAPHY  02/07/2007   EF 55-60%    OB History   No obstetric history on file.      Home Medications    Prior to Admission medications   Medication Sig Start Date End Date Taking? Authorizing Provider  acetaminophen (TYLENOL) 500 MG tablet Take 1,000 mg by mouth in the morning and at bedtime.    [provider]  benzonatate (TESSALON) 200 MG capsule Take 200 mg by mouth 2 (two) times daily as needed for cough.    [provider]  doxycycline (VIBRA-TABS) 100 MG tablet Take 1 tablet (100 mg total) by mouth 2 (two) times daily. 11/22/21   Wendie Agreste, MD  estradiol (ESTRACE) 0.1 MG/GM vaginal cream 0.5 g of cream intravaginally administered daily for 2 weeks, then reduce to twice weekly 10/30/21   [name], MD  folic acid (FOLVITE) 1 MG tablet Take 2 mg by mouth daily.    [provider]  hydrochlorothiazide (HYDRODIURIL) 25 MG tablet Take 1 tablet (25 mg total) by mouth daily. 10/07/21   Bedsole, Amy E, MD  hydroxychloroquine (PLAQUENIL) 200 MG tablet Take 200 mg by mouth 2 (two) times daily. 12/18/20   [provider]  losartan (COZAAR) 25 MG tablet Take 1 tablet (25 mg total) by mouth daily. 06/27/21   Bedsole, Amy E, MD  oseltamivir (TAMIFLU) 75 MG capsule Take 1 capsule (75 mg total) by mouth 2 (two) times daily. 11/19/21   Sharion Balloon, FNP  predniSONE (DELTASONE) 5 MG tablet Take 1 tablet (5 mg total) by mouth daily with breakfast. Patient not taking: Reported on 11/21/2021 11/19/21   Sharion Balloon, FNP  XARELTO 20 MG TABS tablet TAKE 1 TABLET BY MOUTH  DAILY WITH SUPPER 07/25/21   Nicholas Lose, MD    Family History Family History  Problem Relation Age of Onset   Heart attack Father 43       PUD   Hypertension Father    Lung cancer Brother    Heart defect Sister    Hypertension Mother    Coronary artery disease Other        Female 1st degree relative <50    Social History Social History   Tobacco Use   Smoking  status: Former    Packs/day: 0.50    Years: 10.00    Pack years: 5.00    Types: Cigarettes    Start date: 43    Quit date: 10/06/1963    Years since quitting: 58.1   Smokeless tobacco: Never  Vaping Use   Vaping Use: Never used  Substance Use Topics   Alcohol use: No    Comment: stopped 5 years ago   Drug use: No     Allergies   Codeine and Hydrocodone bit-homatrop mbr   Review of Systems Review of Systems  Constitutional:  Negative for chills and fever.  HENT:  Negative for congestion, ear pain, sinus pressure and sore throat.   Eyes:  Negative for discharge and redness.  Respiratory:  Positive for cough and shortness of breath.   Gastrointestinal:  Negative for abdominal pain, diarrhea, nausea and vomiting.    Physical Exam Triage Vital Signs ED Triage Vitals [11/23/21 1153]  Enc Vitals Group     BP (!) 181/79     Pulse Rate 87     Resp 18     Temp 98.5 F (36.9 C)     Temp Source Oral     SpO2 93 %     Weight      Height      Head Circumference      Peak Flow      Pain Score 0     Pain Loc      Pain Edu?      Excl. in [place]?    No data found.  Updated Vital Signs BP (!) 181/79 (BP Location: Left Arm)    Pulse 87    Temp 98.5 F (36.9 C) (Oral)    Resp 18    SpO2 93%     Physical Exam Vitals and nursing note reviewed.  Constitutional:      General: She is not in acute distress.    Appearance: Normal appearance. She is not ill-appearing.  HENT:     Head: Normocephalic and atraumatic.     Nose: No congestion or rhinorrhea.     Mouth/Throat:     Mouth: Mucous membranes are moist.     Pharynx: No oropharyngeal exudate or posterior oropharyngeal erythema.  Eyes:     Conjunctiva/sclera: Conjunctivae normal.  Cardiovascular:     Rate and Rhythm: Normal rate and regular rhythm.     Heart sounds: Normal heart sounds. No murmur heard. Pulmonary:     Effort: Pulmonary effort is normal. No respiratory distress.     Breath sounds: Wheezing (occasional)  present.     Comments: Breath sounds decreased throughout Skin:    General: Skin is warm and dry.  Neurological:     Mental Status: She is alert.  Psychiatric:        Mood and Affect: Mood normal.        Thought Content: Thought content normal.     UC Treatments / Results  Labs (all labs ordered are listed, but only abnormal results are displayed) Labs Reviewed  COVID-19, FLU A+B NAA    EKG   Radiology DG Chest 2 View  Result Date: 11/23/2021 CLINICAL DATA:  cough EXAM: CHEST - 2 VIEW COMPARISON:  10/31/2020 FINDINGS: Mildly prominent lung markings (particular the lung bases) compatible with fibrotic lung disease characterized on prior CT chest. No consolidation. No visible pleural effusions or pneumothorax. Cardiomediastinal silhouette is similar. Clips in bilateral breasts. Polyarticular degenerative change and mild reverse S-shaped thoracic curvature. IMPRESSION: Findings compatible with fibrotic lung disease characterized on prior CT chest without evidence of acute superimposed abnormality. Electronically Signed   By: Margaretha Sheffield M.D.   On: 11/23/2021 12:20    Procedures Procedures (including critical care time)  Medications Ordered in UC Medications - No data to display  Initial Impression / Assessment and Plan / UC Course  I have reviewed the triage vital signs and the nursing notes.  Pertinent labs & imaging results that were available during my care of the patient were reviewed by me and considered in my medical decision making (see chart for details).  Clinical Course as of 11/23/21 1258  Sun Nov 23, 2021  1223 DG Chest 2 View [RM]    Clinical Course User Index [RM] Francene Finders, PA-C   CXR without signs of pneumonia. Concerns for possible Covid-- daughter and patient request covid and flu screening. Encouraged follow up with pulmonologist and primary care provider regarding further treatment pending covid PCR results. Patient expresses understanding.     Final Clinical Impressions(s) / UC Diagnoses   Final diagnoses:  Acute upper respiratory infection  Pulmonary fibrosis ([place])   Discharge Instructions   None    ED Prescriptions   None    PDMP not reviewed this encounter.   Francene Finders, PA-C 11/23/21 1259

## 2021-11-24 ENCOUNTER — Telehealth: Payer: Self-pay | Admitting: *Deleted

## 2021-11-24 LAB — COVID-19, FLU A+B NAA
Influenza A, NAA: NOT DETECTED
Influenza B, NAA: NOT DETECTED
SARS-CoV-2, NAA: NOT DETECTED

## 2021-11-24 NOTE — Telephone Encounter (Signed)
Patient went to Urgent Care 11/23/21.

## 2021-11-24 NOTE — Telephone Encounter (Signed)
PLEASE NOTE: All timestamps contained within this report are represented as [place] Standard Time. CONFIDENTIALTY NOTICE: This fax transmission is intended only for the addressee. It contains information that is legally privileged, confidential or otherwise protected from use or disclosure. If you are not the intended recipient, you are strictly prohibited from reviewing, disclosing, copying using or disseminating any of this information or taking any action in reliance on or regarding this information. If you have received this fax in error, please notify us immediately by telephone so that we can arrange for its return to [place]. Phone: [phone], Toll-Free: [phone], Fax: [phone] Page: 1 of 2 Call Id: [id number] [place] [facility] - Client TELEPHONE ADVICE RECORD [facility] Patient Name: [name] Gender: Female DOB: Feb 05, 1941 Age: 81 Y 65 M 24 D Return Phone Number: [phone] (Primary) Address: City/ State/ Zip: [place]  [address] Client [place] Night - Client Client Site [place] Provider Eliezer Lofts - MD Contact Type Call Who Is Calling Patient / Member / Family / Caregiver Call Type Triage / Clinical Relationship To Patient Self Return Phone Number [phone] (Primary) Chief Complaint Prescription Refill or Medication Request (non symptomatic) Reason for Call Medication Question / Request Initial Comment cbwn, Caller states her medication was not sent to her pharmacy. Translation No Nurse Assessment Nurse: Ardine Bjork, RN, Melissa Date/Time (Eastern Time): 11/21/2021 9:46:07 PM Confirm and document reason for call. If symptomatic, describe symptoms. ---Caller states her medication was not sent to her pharmacy-abx-phone appt by office-med is Pt has a cough, wheezing, sneezing,sob(chronic lung issue-RA med caused left lung issue-dx unknown).Denies fever. Sxs  started Wed. Covid neg x 4. Dx Flu-states Tamiflu sent-seen today on phone and sent in abx. Does the patient have any new or worsening symptoms? ---Yes Will a triage be completed? ---Yes Related visit to physician within the last 2 weeks? ---Yes Does the PT have any chronic conditions? (i.e. diabetes, asthma, this includes High risk factors for pregnancy, etc.) ---Yes List chronic conditions. ---RA, HTN,High Chol.-not on med. Is this a behavioral health or substance abuse call? ---No Guidelines Guideline Title Affirmed Question Affirmed Notes Nurse Date/Time (Eastern Time) Influenza - Seasonal Patient is HIGH RISK (e.g., age > 61 years, pregnant, HIV +, or chronic medical condition) Zayas, RN, Melissa 11/21/2021 9:50:24 PM PLEASE NOTE: All timestamps contained within this report are represented as [place] Standard Time. CONFIDENTIALTY NOTICE: This fax transmission is intended only for the addressee. It contains information that is legally privileged, confidential or otherwise protected from use or disclosure. If you are not the intended recipient, you are strictly prohibited from reviewing, disclosing, copying using or disseminating any of this information or taking any action in reliance on or regarding this information. If you have received this fax in error, please notify us immediately by telephone so that we can arrange for its return to [place]. Phone: [phone], Toll-Free: [phone], Fax: [phone] Page: 2 of 2 Call Id: [id number] [place]. Time Eilene Ghazi Time) Disposition Final User 11/21/2021 7:57:55 PM Send To Call Back Waiting For Nurse Paulita Cradle 11/21/2021 7:59:54 PM Send To Clinical Follow Up Sandrea Matte, RN, Debra 11/21/2021 9:26:44 PM Send To Nurse Graylon Gunning, RN, Rhonda 11/21/2021 9:53:32 PM Call PCP within 24 Hours Yes Zayas, RN, Emelia Salisbury Disagree/Comply Comply Caller Understands Yes PreDisposition Did not know what to do Care Advice Given Per  Guideline CALL PCP WITHIN 24 HOURS: * You need to discuss this with your doctor (or  NP/PA) within the next 24 hours. * IF OFFICE WILL BE CLOSED: I'll page the on-call provider now. EXCEPTION: from 9 pm to 9 am. Since this isn't urgent, we'll hold the page until morning. CARE ADVICE given per Influenza - Seasonal (Adult) guideline. CALL BACK IF: * Fever lasts over 3 days * Runny nose lasts over 10 days * Cough lasts over 3 weeks * Difficulty breathing occurs * You become worse COUGHING SPELLS: * Drink warm fluids. Inhale warm mist. This can help relax the airway and also loosen up phlegm. * Suck on cough drops or hard candy to coat the irritated throat. Comments User: Dub Mikes, RN Date/Time Eilene Ghazi Time): 11/21/2021 9:54:18 PM [facility] [address] [phone]. User: Dub Mikes, RN Date/Time Eilene Ghazi Time): 11/21/2021 9:56:54 PM Pt advised since not urgent and after 9pm to please call back in am after 9AM regarding abx-states [facility] is closed and does not want med sent to another pharm-to call her pharm in am and make sure med is not at pharm prior to calling back-pt verbalizes understanding/appr. To call prn. User: Dub Mikes, RN Date/Time Eilene Ghazi Time): 11/21/2021 9:57:05 PM [id number] Referrals REFERRED TO PCP OFFICE

## 2021-11-24 NOTE — Telephone Encounter (Signed)
PLEASE NOTE: All timestamps contained within this report are represented as [place] Standard Time. CONFIDENTIALTY NOTICE: This fax transmission is intended only for the addressee. It contains information that is legally privileged, confidential or otherwise protected from use or disclosure. If you are not the intended recipient, you are strictly prohibited from reviewing, disclosing, copying using or disseminating any of this information or taking any action in reliance on or regarding this information. If you have received this fax in error, please notify us immediately by telephone so that we can arrange for its return to [place]. Phone: [phone], Toll-Free: [phone], Fax: [phone] Page: 1 of 3 Call Id: [id number] [place] Night - Client TELEPHONE ADVICE RECORD [facility] Patient Name: [name] Gender: Female DOB: 05-06-1941 Age: 81 Y 8 M 24 D Return Phone Number: [phone] (Primary), [phone] (Secondary) Address: City/ State/ Zip: [place]  [address] Client [facility] [place] Night - Client Client Site [place] Provider Eliezer Lofts - MD Contact Type Call Who Is Calling Patient / Member / Family / Caregiver Call Type Triage / Clinical Relationship To Patient Self Return Phone Number [phone] (Secondary) Chief Complaint Headache Reason for Call Symptomatic / Request for Health Information Initial Comment Callers script is not at the pharmacy. The doctor was suppose to send it yesterday. She has a cough and headache. Translation No Nurse Assessment Nurse: Jearld Pies, RN, Lovena Le Date/Time Eilene Ghazi Time): 11/22/2021 8:14:51 AM Confirm and document reason for call. If symptomatic, describe symptoms. ---Pt having same symptoms as yesterday and also an increased cough this morning. Drinking fluids and urinating normally. SOB is chronic and not worsened. Denies fever, chest  pain, or any other symptoms at this time. Does the patient have any new or worsening symptoms? ---Yes Will a triage be completed? ---Yes Related visit to physician within the last 2 weeks? ---Yes Does the PT have any chronic conditions? (i.e. diabetes, asthma, this includes High risk factors for pregnancy, etc.) ---Yes Is this a behavioral health or substance abuse call? ---No Nurse: Jearld Pies, RN, Lovena Le Date/Time (Eastern Time): 11/22/2021 8:12:36 AM Please select the assessment type ---RX called in but not at pharm Additional Documentation ---Pt stating "I had tele health appt with my doctor yesterday and antibiotics were suppose to be sent in to [facility] but they never got it." Document the name of the medication. ---??? Pharmacy name and phone number. ---[facility] in [place] @ [phone] Has the office closed within the last 30 minutes? ---No PLEASE NOTE: All timestamps contained within this report are represented as [place] Standard Time. CONFIDENTIALTY NOTICE: This fax transmission is intended only for the addressee. It contains information that is legally privileged, confidential or otherwise protected from use or disclosure. If you are not the intended recipient, you are strictly prohibited from reviewing, disclosing, copying using or disseminating any of this information or taking any action in reliance on or regarding this information. If you have received this fax in error, please notify us immediately by telephone so that we can arrange for its return to [place]. Phone: [phone], Toll-Free: [phone], Fax: [phone] Page: 2 of 3 Call Id: [id number] Nurse Assessment Does the client directives allow for assistance with medications after hours? ---Yes Is there an on-call physician for the client? ---Yes Additional Documentation ---Paging on call per directives. Guidelines Guideline Title Affirmed Question Affirmed Notes Nurse Date/Time  (Eastern Time) Influenza - Seasonal Patient is HIGH RISK (e.g., age > 35 years,  pregnant, HIV +, or chronic medical condition) Jake Bathe 11/22/2021 8:17:20 AM Disp. Time Eilene Ghazi Time) Disposition Final User 11/22/2021 8:19:06 AM Paged On Call back to [facility], RN, Lovena Le 11/22/2021 8:20:48 AM Call PCP within 24 Hours Yes Jearld Pies, RN, Apolonio Schneiders Disagree/Comply Comply Caller Understands Yes PreDisposition Call Doctor Care Advice Given Per Guideline * IF OFFICE WILL BE CLOSED: I'll page the on-call provider now. EXCEPTION: from 9 pm to 9 am. Since this isn't urgent, we'll hold the page until morning. * You need to discuss this with your doctor (or NP/PA) within the next 24 hours. CALL PCP WITHIN 24 HOURS: CALL BACK IF: Referrals GO TO FACILITY UNDECIDED Paging DoctorName Phone DateTime Result/ Outcome Message Type Notes Merri Ray- MD [phone] 11/22/2021 8:19:06 AM Paged On Call Back to Call Center Doctor Paged Merri Ray- MD 11/22/2021 8:28:35 AM Spoke with On Call - General Message Result On call states "I will send it over to that pharmacy now." Pt made aware and verbalizes understanding. PLEASE NOTE: All timestamps contained within this report are represented as [place] Standard Time. CONFIDENTIALTY NOTICE: This fax transmission is intended only for the addressee. It contains information that is legally privileged, confidential or otherwise protected from use or disclosure. If you are not the intended recipient, you are strictly prohibited from reviewing, disclosing, copying using or disseminating any of this information or taking any action in reliance on or regarding this information. If you have received this fax in error, please notify us immediately by telephone so that we can arrange for its return to [place]. Phone: [phone], Toll-Free: [phone], Fax: [phone] Page: 3 of 3 Call Id: [id number]

## 2021-11-26 ENCOUNTER — Encounter: Payer: Self-pay | Admitting: Internal Medicine

## 2021-11-26 NOTE — Telephone Encounter (Signed)
Dr. Chase Caller, please see pt's email about being sick and wanting you to look and compare her chest xray. Thanks.

## 2021-11-27 NOTE — Telephone Encounter (Signed)
The chest x-ray from the ER says no superimposed findings.  I am glad she is getting better.  The main implication for patients with interstitial lung disease when to get bronchitis is that the bronchitis can flareup the ILD.  And then they might set a new lower baseline.  If she is setting a new lower baseline this will be characterized by not getting better and in fact getting worse in terms of shortness of breath and cough.  Plan - I recommend she monitors her pulse ox when she is walking to see if there is any changes - Also monitor her symptoms -If she does still feel also that this just slow recovery then she might benefit from prednisone short course -But otherwise just monitor -Last seen in September 2022 with a plan for her to come back in 6-8 months but I do think she needs to come back in March 2023 with a spirometry and DLCO so we can better monitor her lung function 30-minute visit    PFT Results Latest Ref Rng & Units 06/23/2021 12/03/2020 03/11/2018  FVC-Pre L 1.99 2.00 2.06  FVC-Predicted Pre % 83 81 81  FVC-Post L - 2.00 -  FVC-Predicted Post % - 81 -  Pre FEV1/FVC % % 76 75 75  Post FEV1/FCV % % - 77 -  FEV1-Pre L 1.51 1.50 1.54  FEV1-Predicted Pre % 84 82 81  FEV1-Post L - 1.54 -  DLCO uncorrected ml/min/mmHg 18.01 17.08 18.80  DLCO UNC% % 99 94 82  DLCO corrected ml/min/mmHg 18.01 17.41 -  DLCO COR %Predicted % 99 96 -  DLVA Predicted % 110 112 102  TLC L - 3.84 -  TLC % Predicted % - 78 -  RV % Predicted % - 77 -

## 2021-12-01 NOTE — Telephone Encounter (Signed)
Does she need to come back sooner? Please advise.

## 2021-12-04 ENCOUNTER — Encounter: Payer: Self-pay | Admitting: Family Medicine

## 2021-12-14 ENCOUNTER — Other Ambulatory Visit: Payer: Self-pay | Admitting: Family Medicine

## 2021-12-15 ENCOUNTER — Other Ambulatory Visit: Payer: Self-pay | Admitting: Family Medicine

## 2022-01-13 ENCOUNTER — Ambulatory Visit: Payer: Medicare Other | Admitting: Family Medicine

## 2022-01-13 VITALS — BP 112/50 | HR 83 | Temp 96.4°F | Resp 14 | Ht 64.0 in | Wt 221.0 lb

## 2022-01-13 DIAGNOSIS — R200 Anesthesia of skin: Secondary | ICD-10-CM | POA: Diagnosis not present

## 2022-01-13 DIAGNOSIS — M542 Cervicalgia: Secondary | ICD-10-CM

## 2022-01-13 DIAGNOSIS — M25512 Pain in left shoulder: Secondary | ICD-10-CM | POA: Diagnosis not present

## 2022-01-13 DIAGNOSIS — R208 Other disturbances of skin sensation: Secondary | ICD-10-CM

## 2022-01-13 NOTE — Progress Notes (Signed)
Patient ID: [name], female    DOB: [birth year], 81 y.o.   MRN: [id number]  This visit was conducted in person.  BP (!) 112/50   Pulse 83   Temp (!) 96.4 F (35.8 C)   Resp 14   Ht '5\' 4"'$  (1.626 m)   Wt 221 lb (100.2 kg)   SpO2 92%   BMI 37.93 kg/m    CC:  Chief Complaint  Patient presents with   Shoulder Pain    Left side, x 1 month, getting worse, decreased range of motion. No injury    Subjective:   HPI: [name] is a 81 y.o. female presenting on 01/13/2022 for Shoulder Pain (Left side, x 1 month, getting worse, decreased range of motion. No injury)  Left shoulder pain x 1 month:  new  Decreased ROM of left arm. Increased pain with lifting up, ext rotation.  Pain in left upper neck. No pain with moving neck. No radiation of pain.  No fall, no new changes.  She has history of RA.  HX of rotaor cuff surgery.  Improved with moving arm across chest and holding bra.   HAS numbness off and on in left arm.  Dropping things but due to changes for RA I hands.   Urinary symptoms of burning in left side have improved with cephalexin  (She was given this for Mohs surgery for skin cancer)  She was unable to given urine sample today.    Dr Amil Amen Rheum.. per pt may have given her a steroid injeciton in shoulder... she states it did not help.  No X-rays done.   Relevant past medical, surgical, family and social history reviewed and updated as indicated. Interim medical history since our last visit reviewed. Allergies and medications reviewed and updated. Outpatient Medications Prior to Visit  Medication Sig Dispense Refill   acetaminophen (TYLENOL) 500 MG tablet Take 1,000 mg by mouth in the morning and at bedtime.     cephALEXin (KEFLEX) 500 MG capsule Take by mouth.     estradiol (ESTRACE) 0.1 MG/GM vaginal cream 0.5 g of cream intravaginally administered daily for 2 weeks, then reduce to twice weekly 35.3 g 12   folic acid (FOLVITE) 1 MG tablet  Take 2 mg by mouth daily.     hydrochlorothiazide (HYDRODIURIL) 25 MG tablet Take 1 tablet (25 mg total) by mouth daily. 90 tablet 1   hydroxychloroquine (PLAQUENIL) 200 MG tablet Take 200 mg by mouth 2 (two) times daily.     losartan (COZAAR) 25 MG tablet Take 1 tablet (25 mg total) by mouth daily. 90 tablet 3   predniSONE (DELTASONE) 5 MG tablet Take 1 tablet (5 mg total) by mouth daily with breakfast. 90 tablet 1   solifenacin (VESICARE) 5 MG tablet Take 1 tablet by mouth once daily 90 tablet 0   XARELTO 20 MG TABS tablet TAKE 1 TABLET BY MOUTH  DAILY WITH SUPPER 90 tablet 3   benzonatate (TESSALON) 200 MG capsule Take 200 mg by mouth 2 (two) times daily as needed for cough.     doxycycline (VIBRA-TABS) 100 MG tablet Take 1 tablet (100 mg total) by mouth 2 (two) times daily. 20 tablet 0   oseltamivir (TAMIFLU) 75 MG capsule Take 1 capsule (75 mg total) by mouth 2 (two) times daily. 10 capsule 0   tolterodine (DETROL LA) 4 MG 24 hr capsule      No facility-administered medications prior to visit.     Per HPI  unless specifically indicated in ROS section below Review of Systems  Constitutional:  Negative for fatigue and fever.  HENT:  Negative for congestion.   Eyes:  Negative for pain.  Respiratory:  Negative for cough and shortness of breath.   Cardiovascular:  Negative for chest pain, palpitations and leg swelling.  Gastrointestinal:  Negative for abdominal pain.  Genitourinary:  Negative for dysuria and vaginal bleeding.  Musculoskeletal:  Negative for back pain.  Neurological:  Negative for syncope, light-headedness and headaches.  Psychiatric/Behavioral:  Negative for dysphoric mood.   Objective:  BP (!) 112/50   Pulse 83   Temp (!) 96.4 F (35.8 C)   Resp 14   Ht '5\' 4"'$  (1.626 m)   Wt 221 lb (100.2 kg)   SpO2 92%   BMI 37.93 kg/m   Wt Readings from Last 3 Encounters:  01/13/22 221 lb (100.2 kg)  11/03/21 222 lb 4 oz (100.8 kg)  10/10/21 219 lb 2 oz (99.4 kg)       Physical Exam Constitutional:      General: She is not in acute distress.    Appearance: Normal appearance. She is well-developed. She is not ill-appearing or toxic-appearing.  HENT:     Head: Normocephalic.     Right Ear: Hearing, tympanic membrane, ear canal and external ear normal. Tympanic membrane is not erythematous, retracted or bulging.     Left Ear: Hearing, tympanic membrane, ear canal and external ear normal. Tympanic membrane is not erythematous, retracted or bulging.     Nose: No mucosal edema or rhinorrhea.     Right Sinus: No maxillary sinus tenderness or frontal sinus tenderness.     Left Sinus: No maxillary sinus tenderness or frontal sinus tenderness.     Mouth/Throat:     Pharynx: Uvula midline.  Eyes:     General: Lids are normal. Lids are everted, no foreign bodies appreciated.     Conjunctiva/sclera: Conjunctivae normal.     Pupils: Pupils are equal, round, and reactive to light.  Neck:     Thyroid: No thyroid mass or thyromegaly.     Vascular: No carotid bruit.     Trachea: Trachea normal.  Cardiovascular:     Rate and Rhythm: Normal rate and regular rhythm.     Pulses: Normal pulses.     Heart sounds: Normal heart sounds, S1 normal and S2 normal. No murmur heard.   No friction rub. No gallop.  Pulmonary:     Effort: Pulmonary effort is normal. No tachypnea or respiratory distress.     Breath sounds: Normal breath sounds. No decreased breath sounds, wheezing, rhonchi or rales.  Abdominal:     General: Bowel sounds are normal.     Palpations: Abdomen is soft.     Tenderness: There is no abdominal tenderness.  Musculoskeletal:     Right shoulder: Normal.     Left shoulder: Tenderness and bony tenderness present. No swelling, deformity or effusion. Decreased range of motion. Decreased strength. Normal pulse.     Cervical back: Neck supple. Pain with movement and muscular tenderness present. No spinous process tenderness. Decreased range of motion.  Skin:     General: Skin is warm and dry.     Findings: No rash.  Neurological:     Mental Status: She is alert.     Motor: Motor function is intact.     Coordination: Coordination is intact.     Comments: Positive spurling on left  Psychiatric:  Mood and Affect: Mood is not anxious or depressed.        Speech: Speech normal.        Behavior: Behavior normal. Behavior is cooperative.        Thought Content: Thought content normal.        Judgment: Judgment normal.      Results for orders placed or performed during the hospital encounter of 11/23/21  Covid-19, Flu A+B ([facility])   Specimen: Nasal Swab; Nasopharyngeal   Naso  Result Value Ref Range   SARS-CoV-2, NAA Not Detected Not Detected   Influenza A, NAA Not Detected Not Detected   Influenza B, NAA Not Detected Not Detected   Test Information: Comment     This visit occurred during the SARS-CoV-2 public health emergency.  Safety protocols were in place, including screening questions prior to the visit, additional usage of staff PPE, and extensive cleaning of exam room while observing appropriate contact time as indicated for disinfecting solutions.   COVID 19 screen:  No recent travel or known exposure to COVID19 The patient denies respiratory symptoms of COVID 19 at this time. The importance of social distancing was discussed today.   Assessment and Plan    Problem List Items Addressed This Visit   None Visit Diagnoses     Burning sensation    -  Primary   Left arm numbness       Relevant Orders   DG Shoulder Left (Completed)   DG Cervical Spine Complete (Completed)   Acute pain of left shoulder       Relevant Orders   DG Shoulder Left (Completed)   DG Cervical Spine Complete (Completed)   Neck pain       Relevant Orders   DG Shoulder Left (Completed)   DG Cervical Spine Complete (Completed)      I will obtain records from Dr. Amil Amen, rheumatology, regarding recent shoulder injection which was likely  steroids. Symptoms correspond with possible cervical spine source of left arm pain and numbness.  We will start with a plain x-ray of the left shoulder and cervical spine.  She will start home physical therapy.    Eliezer Lofts, MD

## 2022-01-13 NOTE — Patient Instructions (Signed)
I will get records from Dr. Amil Amen from last [place]. ? Start home PT. ? I will call with X-ray results. ? ? ? ?

## 2022-01-14 ENCOUNTER — Ambulatory Visit (INDEPENDENT_AMBULATORY_CARE_PROVIDER_SITE_OTHER)
Admission: RE | Admit: 2022-01-14 | Discharge: 2022-01-14 | Disposition: A | Discharge status: Home / Self Care | Payer: Medicare Other | Source: Ambulatory Visit | Attending: Family Medicine | Admitting: Family Medicine

## 2022-01-14 DIAGNOSIS — M25512 Pain in left shoulder: Secondary | ICD-10-CM | POA: Diagnosis not present

## 2022-01-14 DIAGNOSIS — R200 Anesthesia of skin: Secondary | ICD-10-CM | POA: Diagnosis not present

## 2022-01-14 DIAGNOSIS — M542 Cervicalgia: Secondary | ICD-10-CM | POA: Diagnosis not present

## 2022-01-15 ENCOUNTER — Telehealth: Payer: Self-pay | Admitting: Family Medicine

## 2022-01-15 ENCOUNTER — Telehealth: Payer: Self-pay

## 2022-01-15 NOTE — Telephone Encounter (Signed)
?  Call ?A CK of 74 is not very high at all.  I am not sure what the R means.  We can recheck her CK at our office if she would like. ?

## 2022-01-15 NOTE — Telephone Encounter (Signed)
-----   Message from Velna Hatchet, RT sent at 01/14/2022  3:12 PM EDT ----- ?Regarding: Lab results from patient from outside office ?Patient came in today for xrays.  She stated you asked her about some lab results that she had at an outside facility.  Per patient,  ? ?On 12/30/21 my CK was R74.8.  Is this very high? ? ?Thank you,  ? ?[name] ? ?

## 2022-01-15 NOTE — Telephone Encounter (Signed)
Spoke with Mrs. [name].  X-ray results still pending read from radiologist.  ?

## 2022-01-15 NOTE — Telephone Encounter (Signed)
New message    Patient returning call back to the office.   X-ray done on yesterday

## 2022-01-15 NOTE — Telephone Encounter (Signed)
Mrs. Tigges notified as instructed by telephone.  She will call and schedule lab appointment if she decides to recheck CK level here at our office.  Awaiting X-ray results done yesterday.  ?

## 2022-01-16 ENCOUNTER — Telehealth: Payer: Self-pay | Admitting: *Deleted

## 2022-01-16 DIAGNOSIS — M542 Cervicalgia: Secondary | ICD-10-CM

## 2022-01-16 DIAGNOSIS — R200 Anesthesia of skin: Secondary | ICD-10-CM

## 2022-01-16 DIAGNOSIS — M25519 Pain in unspecified shoulder: Secondary | ICD-10-CM

## 2022-01-16 NOTE — Telephone Encounter (Signed)
Pt viewed results via mychart regarding xray results. She does want to see neck specialist, she would like to see someone in [place], I advise pt PCP will proceed with referral and someone will call her back in the next week or 2 ?

## 2022-01-16 NOTE — Telephone Encounter (Signed)
Referral placed.

## 2022-01-22 NOTE — Telephone Encounter (Signed)
Reason for Referral Request: Pt stated she has not heard back about her referral, would like to know the name of the place it was sent to and speak to the nurse about it.  ? ?Has patient been seen PCP for this complaint? ?01/13/2022 was seen for left shoulder pain ? ?Referral for which specialty: Neck pain/left arm numbness  ? ?Preferred office/provider: It does not say who it was sent to.  ?

## 2022-01-27 ENCOUNTER — Encounter: Payer: Self-pay | Admitting: Internal Medicine

## 2022-01-27 ENCOUNTER — Ambulatory Visit (INDEPENDENT_AMBULATORY_CARE_PROVIDER_SITE_OTHER): Payer: Medicare Other | Admitting: Internal Medicine

## 2022-01-27 VITALS — BP 122/76 | HR 79 | Temp 98.4°F | Ht 63.0 in | Wt 223.6 lb

## 2022-01-27 DIAGNOSIS — M359 Systemic involvement of connective tissue, unspecified: Secondary | ICD-10-CM

## 2022-01-27 DIAGNOSIS — J8489 Other specified interstitial pulmonary diseases: Secondary | ICD-10-CM | POA: Diagnosis not present

## 2022-01-27 LAB — PULMONARY FUNCTION TEST
DL/VA % pred: 98 %
DL/VA: 4.05 ml/min/mmHg/L
DLCO cor % pred: 84 %
DLCO cor: 15.28 ml/min/mmHg
DLCO unc % pred: 85 %
DLCO unc: 15.37 ml/min/mmHg
FEF 25-75 Pre: 0.95 L/sec
FEF2575-%Pred-Pre: 76 %
FEV1-%Pred-Pre: 83 %
FEV1-Pre: 1.48 L
FEV1FVC-%Pred-Pre: 99 %
FEV6-%Pred-Pre: 88 %
FEV6-Pre: 2.01 L
FEV6FVC-%Pred-Pre: 105 %
FVC-%Pred-Pre: 83 %
FVC-Pre: 2.01 L
Pre FEV1/FVC ratio: 73 %
Pre FEV6/FVC Ratio: 100 %

## 2022-01-27 NOTE — Patient Instructions (Signed)
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 M35.9   ?  ? ? ?Stable and mild on PFT ? ? ?Plan ? - weight loss recommended - 50 pounds over 2-3 years ? - 1500 calorie/day low carb diet  - talk to PCP [name], MD ?- for RA  ? - support coming off prednisone ? - Dr Amil Amen has you on plaquenil ?- PE ? - xaretlto per Dr Lindi Adie ? ? ?Followup ? -spiro/dlco in 9 months ?  -15 min visti at followup; Symptoms score and walk test at followup ?

## 2022-01-27 NOTE — Telephone Encounter (Signed)
FYI ?Referral was placed previously to same location in [place] [facility].  ?Pt refused/declined appt due to them not being able to schedule her with who she requested. She declined the appt, cancelled her 11/05/21 appt and never rescheduled.  ? ?I have resent the referral to their office.  ? ?The patient needs to call and schedule her appt with their office.  ? ?[facility] ?Address: [address] ?[place], [place] [address] ?Phone: [phone] ?

## 2022-01-27 NOTE — Progress Notes (Signed)
Spirometry and Dlco done today. 

## 2022-01-27 NOTE — Progress Notes (Signed)
? ? ? ?HPI ? ?PCP [name], MD ? ?HPI ? ?IOV 02/22/2018 ? ? ?[name] presents on behalf of Dr. Leigh Aurora and his team for evaluation of shortness of breath and concern for possible interstitial lung disease seen on chest x-ray.  History is given by the patient and review of the chart including old medical records and visualization of the films mentioned.  According to the patient she was diagnosed with rheumatoid arthritis not otherwise specified approximately 3 years ago.  For the first year she was on methotrexate which caused weight gain and itchiness and this was stopped.  Subsequently was on leflunomide which caused diarrhea and 16 pound weight loss and therefore had to be stopped in 2018.  Early in 2019 she was started on Enbrel which she believes is causing weight gain although when I asked her if her weight gain was just a reflection of her not being on [place] she conceded that it might not be the Enbrel directly causing the weight gain.  Nevertheless she has had weight gain with a significant and documented below.  Associated with his weight gain has been insidious onset of shortness of breath with exertion relieved by rest.  She notices shortness of breath when walking up an incline compared to her peers and she has to stop to rest.  There is no associated chest pain or proximal nocturnal dyspnea or orthopnea wheezing or cough or edema.  She says the most of the time she does not subjectively feel the dyspnea but then she is noticed to be visibly dyspneic by the family or onlookers.  Such was the case when she walked in our office today when she got tachycardic without any desaturation. ? ?[employer] of chest physicians interstitial lung disease questionnaire ? ?Symptoms: She has occasional cough but not bothersome.  Does not cough at night does not wake her up.  She walks slower than people of her age.  Started 6 months ago ? ?Past medical history: She says she has a "fat heart"  although 2013 echocardiogram that I reviewed was normal.  She denies any weight loss or dysphagia or heartburn or acid reflux or dry eyes or chest pain or ongoing arthralgia ? ?Personal exposure history: She never smoked any recreational drugs.  She started smoking at age 22 smoked half a pack a day and quit when she was 46. ? ?Family history of lung disease: This emphysema in her brother but no family history of pulmonary fibrosis ? ?Home environment history: He does not old house.  There is no humidifier or insomnia or hot tub or Jacuzzi or water damage. ? 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 ?Occupational exposure none: ? ?Organic dust exposure: None ? ?Metal dust exposure: None ? ?Medication toxicity history: None ? ?Imaging history: The only chest x-ray I have in the system was February 2017: This looks clear to me although the radiologist reported as chronic mild interstitial changes. ? ?Simple office walk 185 feet x  3 laps goal with forehead probe 02/22/2018 ?  ?O2 used Room air  ?Number laps completed 3 all laps  ?Comments about pace Normal pace  ?Resting Pulse Ox/HR 98% and 85/min  ?Final Pulse Ox/HR 97% and 121/min  ?Desaturated </= 88% no  ?Desaturated <= 3% points no  ?Got Tachycardic >/= 90/min yes  ?Symptoms at end of test Visibly Dyspneic and improved with rest. No chest pain  or cough but no subjective dyspnea  ?Miscellaneous comments none  ? ? ? ?OV 03/15/2018 ? ?Chief Complaint  ?Patient presents with  ? Follow-up  ?  Echo and HRCT performed 6/6 and PFT performed 6/7.  Pt states she has been doing well since last visit and denies any complaints.  ? ?[name] returns for follow-up after doing work-up for shortness of breath in the setting of rheumatoid arthritis with a specific question of ruling out interstitial lung disease ? ? ?She returns with her husband as before.  This time she is accompanied by her  daughter who lives in [place], [place].  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  This back pain preceded the diagnosis of rheumatoid arthritis.  It is relieved by rest.  Her work-up shows normal pulmonary function test including DLCO but the radiologist did call for high-resolution CT chest findings of indeterminate for UIP interstitial lung disease.  There is also an additional 4 mm right upper lobe nodule.  Her echocardiogram itself is normal.  There is an additional finding of coronary artery calcification on the CT chest.  We discussed all this in detail. ? ? ? ?IMPRESSION: HRCT ?1. Findings do suggest interstitial lung disease, but at this time, ?the CT pattern is considered indeterminate for usual interstitial ?pneumonia (UIP). Repeat high-resolution chest CT is suggested in 12 ?months to assess for temporal changes in the appearance of the lung ?parenchyma. ?2. 4 mm right upper lobe pulmonary nodule. This is nonspecific but ?statistically likely benign. Attention at time of repeat ?high-resolution chest CT is recommended. ?3. Aortic atherosclerosis, in addition to left main and left ?anterior descending coronary artery disease. Assessment for ?potential risk factor modification, dietary therapy or pharmacologic ?therapy may be warranted, if clinically indicated. ?4. Hepatic steatosis. ?  ?Aortic Atherosclerosis (ICD10-I70.0). ?  ?  ?Electronically Signed ?  By: Vinnie Langton M.D. ?  On: 03/10/2018 12:02 ? ?PFT  ? ?Results for Rachel, [name] (MRN [id number]) as of 03/15/2018 10:03 ? Ref. Range 03/11/2018 11:38  ?FVC-Pre Latest Units: L 2.06  ?FVC-%Pred-Pre Latest Units: % 81  ?FEV1-Pre Latest Units: L 1.54  ?FEV1-%Pred-Pre Latest Units: % 81  ?Pre FEV1/FVC ratio Latest Units: % 75  ?FEV1FVC-%Pred-Pre Latest Units: % 100  ?FEF 25-75  Pre Latest Units: L/sec 1.11  ?FEF2575-%Pred-Pre Latest Units: % 79  ?FEV6-Pre Latest Units: L 2.06  ?FEV6-%Pred-Pre Latest Units: % 86  ?Pre FEV6/FVC Ratio Latest Units: % 100  ?FEV6FVC-%Pred-Pre Latest Units: % 106  ?DLCO unc Latest Units: ml/min/mmHg 18.80  ?DLCO unc % pred Latest Units: % 82  ?DL/VA Latest Units: ml/min/mmHg/L 4.78  ?DL/VA % pred Latest Units: % 102  ? ? ? ?Study Conclusions - ECHO 03/10/18 ?  ?- Left ventricle: The cavity size was normal. Systolic function was ?  normal. The estimated ejection fraction was in the range of 60% ?  to 65%. Wall motion was normal; there were no regional wall ?  motion abnormalities. Doppler parameters are consistent with ?  abnormal left ventricular relaxation (grade 1 diastolic ?  dysfunction). Doppler parameters are consistent with elevated ?  ventricular end-diastolic filling pressure. ?- Aortic valve: There was no regurgitation. ?- Mitral valve: Calcified annulus. Mildly thickened leaflets . ?  There was mild regurgitation. ?- Right ventricle: The cavity size was normal. Wall  thickness was ?  normal. Systolic function was normal. ?- Pulmonary arteries: Systolic pressure was within the normal ?  range. ?- Inferior vena cava: The vessel was normal in size. ?- Pericardium, extracardiac: There was no pericardial effusion. ?  ? has a past medical history of Cataract, Dysrhythmia, GERD (gastroesophageal reflux disease), Hyperlipidemia, Hypertension, Migraines, Obesity, Osteoarthritis, and Phlebitis of leg, right, superficial (1986/1989). ? ? reports that she quit smoking about 40 years ago.  ?   ? ?OV 04/24/2020 ? ?Subjective:  ?Patient ID: [name], female , DOB: 1940-12-21 , age 24 y.o. , MRN: [id number] , ADDRESS: [address] ?[place] [address] ? ? ?04/24/2020 -   ?Chief Complaint  ?Patient presents with  ? Follow-up  ?  follow up from recent hospitalization for pulmonary embolism  ? ? ? ?HPI ?[name] 81 y.o. -presents with her daughter  for follow-up.  Not seen her since 2019.  Was supposed to repeat a high-resolution CT chest in case she has ILD or not.  The end of June 2021 after a trip to the mountains and also recently being sedentary

## 2022-01-28 ENCOUNTER — Encounter: Payer: Self-pay | Admitting: Family Medicine

## 2022-01-28 NOTE — Telephone Encounter (Signed)
I spoke with Mrs. [name].  She states she is wanting to see an orthopedic doctor to have her shoulder injected.  She has seen Paralee Cancel in the past.   ?

## 2022-01-29 ENCOUNTER — Encounter: Payer: Self-pay | Admitting: Physical Medicine & Rehabilitation

## 2022-01-29 ENCOUNTER — Other Ambulatory Visit: Payer: Self-pay | Admitting: Family Medicine

## 2022-01-29 DIAGNOSIS — M25519 Pain in unspecified shoulder: Secondary | ICD-10-CM

## 2022-01-29 NOTE — Addendum Note (Signed)
Addended by: Eliezer Lofts E on: 01/29/2022 08:35 AM ? ? Modules accepted: Orders ? ?

## 2022-01-29 NOTE — Telephone Encounter (Signed)
FYI ? ?[name]: ?This patient has chosen to cancel referral to PM and R and would prefer referral to ortho ( Dr. Alvan Dame) for shoulder pain and possible injection. New referral has been placed and  Pm and R referral has been cancelled. ? ?Donna/CMA covering.. please call to let pt know this is in process. ?

## 2022-01-29 NOTE — Addendum Note (Signed)
Addended by: Eliezer Lofts E on: 01/29/2022 08:31 AM ? ? Modules accepted: Orders ? ?

## 2022-02-03 NOTE — Telephone Encounter (Signed)
Mrs. Potvin notified as instructed by telephone.  Phone number provided to for Dr. Aurea Graff office so patient can call and schedule appointment.  Per Appointment with PM & R in Epic, there is already a note that states cancel appointment per PCP's office and dated for today.  ?

## 2022-02-03 NOTE — Telephone Encounter (Signed)
[facility] Referral cancelled -  Patient needs to call and cancel her appt scheduled in June 2023 ? ?New referral sent to Dr Alvan Dame - Ortho. Pt can call to schedule. ? ?Pietro Cassis. Alvan Dame, MD ?[facility] ?Address: [address] ?[place], [place] [address] ?Phone: [phone] ?

## 2022-02-25 ENCOUNTER — Ambulatory Visit (INDEPENDENT_AMBULATORY_CARE_PROVIDER_SITE_OTHER): Payer: Medicare Other

## 2022-02-25 VITALS — Ht 63.0 in | Wt 205.0 lb

## 2022-02-25 DIAGNOSIS — Z0000 Encounter for general adult medical examination without abnormal findings: Secondary | ICD-10-CM | POA: Diagnosis not present

## 2022-02-25 NOTE — Progress Notes (Addendum)
I connected with [name] today by telephone and verified that I am speaking with the correct person using two identifiers. Location patient: home Location provider: work Persons participating in the virtual visit: Verleen Stuckey, Glenna Durand LPN.   I discussed the limitations, risks, security and privacy concerns of performing an evaluation and management service by telephone and the availability of in person appointments. I also discussed with the patient that there may be a patient responsible charge related to this service. The patient expressed understanding and verbally consented to this telephonic visit.    Interactive audio and video telecommunications were attempted between this provider and patient, however failed, due to patient having technical difficulties OR patient did not have access to video capability.  We continued and completed visit with audio only.     Vital signs may be patient reported or missing.  Subjective:   [name] is a 81 y.o. female who presents for Medicare Annual (Subsequent) preventive examination.  Review of Systems     Cardiac Risk Factors include: advanced age (>80mn, >>21women);dyslipidemia;hypertension;obesity (BMI >30kg/m2)     Objective:    Today's Vitals   02/25/22 0941  Weight: 205 lb (93 kg)  Height: '5\' 3"'$  (1.6 m)   Body mass index is 36.31 kg/m.     02/25/2022    9:47 AM 05/10/2020   10:30 AM 04/01/2020    9:25 PM 04/01/2020    9:21 PM 10/11/2019    9:37 AM 09/20/2019   10:56 AM 09/15/2019   12:32 PM  Advanced Directives  Does Patient Have a Medical Advance Directive? Yes Yes Yes Yes Yes Yes Yes  Type of AParamedicof AElk CreekLiving will HRandolphLiving will HManhattanLiving will HAtlasLiving will HMegargelLiving will HLadogaLiving will HPollock PinesLiving will   Does patient want to make changes to medical advance directive?   No - Patient declined   No - Patient declined   Copy of HCissna Parkin Chart? No - copy requested No - copy requested   No - copy requested No - copy requested     Current Medications (verified) Outpatient Encounter Medications as of 02/25/2022  Medication Sig   acetaminophen (TYLENOL) 500 MG tablet Take 1,000 mg by mouth in the morning and at bedtime.   estradiol (ESTRACE) 0.1 MG/GM vaginal cream 0.5 g of cream intravaginally administered daily for 2 weeks, then reduce to twice weekly   folic acid (FOLVITE) 1 MG tablet Take 2 mg by mouth daily.   hydrochlorothiazide (HYDRODIURIL) 25 MG tablet Take 1 tablet (25 mg total) by mouth daily.   hydroxychloroquine (PLAQUENIL) 200 MG tablet Take 200 mg by mouth 2 (two) times daily.   losartan (COZAAR) 25 MG tablet Take 1 tablet (25 mg total) by mouth daily.   predniSONE (DELTASONE) 5 MG tablet Take 1 tablet (5 mg total) by mouth daily with breakfast.   solifenacin (VESICARE) 5 MG tablet Take 1 tablet by mouth once daily   Vitamin D, Cholecalciferol, 25 MCG (1000 UT) TABS Take 1 tablet by mouth 2 (two) times daily.   XARELTO 20 MG TABS tablet TAKE 1 TABLET BY MOUTH  DAILY WITH SUPPER   cephALEXin (KEFLEX) 500 MG capsule Take by mouth. (Patient not taking: Reported on 02/25/2022)   No facility-administered encounter medications on file as of 02/25/2022.    Allergies (verified) Codeine and Hydrocodone bit-homatrop mbr   History: Past  Medical History:  Diagnosis Date   Cancer [facility])    breast   Cataract    diagnosed by Dr. Phineas Douglas   Dysrhythmia    PSVT/ palpitations-  controlled with prn Inderal   GERD (gastroesophageal reflux disease)    30 years ago.    Hyperlipidemia    statin intolerant   Hypertension    eccho 4/13 EPIC   Migraines    Obesity    Osteoarthritis    Personal history of radiation therapy    right breast   Phlebitis of leg, right,  superficial 1986/1989   x 2   Pulmonary fibrosis (HCC)    RA (rheumatoid arthritis) ([place])    Past Surgical History:  Procedure Laterality Date   BREAST BIOPSY Left 07/05/2019   BREAST BIOPSY Bilateral 08/24/2019   BREAST EXCISIONAL BIOPSY Left 09/20/2019   BREAST LUMPECTOMY Right 09/20/2019   BREAST LUMPECTOMY WITH RADIOACTIVE SEED LOCALIZATION Bilateral 09/20/2019   Procedure: BILATERAL BREAST LUMPECTOMIES WITH BILATERAL RADIOACTIVE SEED LOCALIZATION AND RIGHT NIPPLE BIOPSY;  Surgeon: Erroll Luna, MD;  Location: [place];  Service: General;  Laterality: Bilateral;   CARDIAC CATHETERIZATION  1990s   negative   CARDIOVASCULAR STRESS TEST  2008   NML   CARDIOVASCULAR STRESS TEST  02/02/2007   EF 70%, NO ISCHEMIA   DILATION AND CURETTAGE, DIAGNOSTIC / THERAPEUTIC  2008   LESION DESTRUCTION  10/2017   Face; Dr. Ronnald Ramp   ROTATOR CUFF REPAIR  1999   Right, left in 2002   TOTAL KNEE ARTHROPLASTY  2008   Left   TOTAL KNEE ARTHROPLASTY  10/11/2012   Procedure: TOTAL KNEE ARTHROPLASTY;  Surgeon: Mauri Pole, MD;  Location: [facility];  Service: Orthopedics;  Laterality: Right;   US ECHOCARDIOGRAPHY  02/07/2007   EF 55-60%   Family History  Problem Relation Age of Onset   Heart attack Father 50       PUD   Hypertension Father    Lung cancer Brother    Heart defect Sister    Hypertension Mother    Coronary artery disease Other        Female 1st degree relative <50   Social History   Socioeconomic History   Marital status: Married    Spouse name: Not on file   Number of children: Not on file   Years of education: Not on file   Highest education level: Not on file  Occupational History   Occupation: Retired  Tobacco Use   Smoking status: Former    Packs/day: 0.50    Years: 10.00    Pack years: 5.00    Types: Cigarettes    Start date: 21    Quit date: 10/06/1963    Years since quitting: 58.4   Smokeless tobacco: Never  Vaping Use   Vaping Use: Never used   Substance and Sexual Activity   Alcohol use: No    Comment: stopped 5 years ago   Drug use: No   Sexual activity: Yes  Other Topics Concern   Not on file  Social History Narrative   Regular exercise: yes walks 1 mile a day   Diet: loves butter, fruit and veggies   Social Determinants of Health   Financial Resource Strain: Low Risk    Difficulty of Paying Living Expenses: Not hard at all  Food Insecurity: No Food Insecurity   Worried About Charity fundraiser in the Last Year: Never true   [place] in the Last Year: Never  true  Transportation Needs: No Transportation Needs   Lack of Transportation (Medical): No   Lack of Transportation (Non-Medical): No  Physical Activity: Sufficiently Active   Days of Exercise per Week: 7 days   Minutes of Exercise per Session: 60 min  Stress: No Stress Concern Present   Feeling of Stress : Not at all  Social Connections: Not on file    Tobacco Counseling Counseling given: Not Answered   Clinical Intake:  Pre-visit preparation completed: Yes  Pain : No/denies pain     Nutritional Status: BMI > 30  Obese Nutritional Risks: None Diabetes: No  How often do you need to have someone help you when you read instructions, pamphlets, or other written materials from your doctor or pharmacy?: 1 - Never  Diabetic? no  Interpreter Needed?: No  Information entered by :: NAllen LPN   Activities of Daily Living    02/25/2022    9:48 AM  In your present state of health, do you have any difficulty performing the following activities:  Hearing? 0  Comment just a little  Vision? 0  Difficulty concentrating or making decisions? 0  Walking or climbing stairs? 0  Dressing or bathing? 0  Doing errands, shopping? 0  Preparing Food and eating ? N  Using the Toilet? N  In the past six months, have you accidently leaked urine? Y  Do you have problems with loss of bowel control? N  Managing your Medications? N  Managing your  Finances? N  Housekeeping or managing your Housekeeping? N    Patient Care Team: [name], MD as PCP - General Josue Hector, MD as PCP - Cardiology (Cardiology) Rosita Kea, PA-C (Inactive) as Physician Assistant (Rheumatology) Christain [place], [place] as Referring Physician (Optometry) Danella Sensing, MD as Consulting Physician (Dermatology) Mauro Kaufmann, RN as Oncology Nurse Navigator Rockwell Germany, RN as Oncology Nurse Navigator  Indicate any recent Medical Services you may have received from other than [facility] providers in the past year (date may be approximate).     Assessment:   This is a routine wellness examination for [name].  Hearing/Vision screen Vision Screening - Comments:: Regular eye exams, Dr. Jodene Nam  Dietary issues and exercise activities discussed: Current Exercise Habits: Home exercise routine, Type of exercise: walking, Time (Minutes): 60, Frequency (Times/Week): 7, Weekly Exercise (Minutes/Week): 420   Goals Addressed             This Visit's Progress    Patient Stated       02/25/2022, no goals       Depression Screen    02/25/2022    9:47 AM 05/10/2020   10:31 AM 02/06/2020   12:21 PM 11/24/2018   10:37 AM 11/04/2017    8:11 AM 10/06/2016    1:23 PM 09/11/2015    2:13 PM  PHQ 2/9 Scores  PHQ - 2 Score 0 0 0 0 0 0 0  PHQ- 9 Score  0   0      Fall Risk    02/25/2022    9:47 AM 05/10/2020   10:30 AM 02/06/2020   12:21 PM 11/24/2018   10:37 AM 11/04/2017    8:11 AM  Fall Risk   Falls in the past year? 0 0 0 0 Yes  Comment     4 falls with minor injury and no medical treatment  Number falls in past yr: 0 0   2 or more  Injury with Fall? 0 0  Risk for fall due to : Medication side effect Medication side effect     Follow up Falls evaluation completed;Education provided;Falls prevention discussed Falls evaluation completed;Falls prevention discussed       FALL RISK PREVENTION PERTAINING TO THE HOME:  Any stairs in or around the  home? Yes  If so, are there any without handrails? No  Home free of loose throw rugs in walkways, pet beds, electrical cords, etc? Yes  Adequate lighting in your home to reduce risk of falls? Yes   ASSISTIVE DEVICES UTILIZED TO PREVENT FALLS:  Life alert? No  Use of a cane, walker or w/c? No  Grab bars in the bathroom? Yes  Shower chair or bench in shower? No  Elevated toilet seat or a handicapped toilet? Yes   TIMED UP AND GO:  Was the test performed? No .      Cognitive Function:    05/10/2020   10:33 AM 11/04/2017    8:11 AM 10/06/2016    1:38 PM  MMSE - Mini Mental State Exam  Orientation to time '5 5 5  '$ Orientation to Place '5 5 5  '$ Registration '3 3 3  '$ Attention/ Calculation 5 0 0  Recall '3 3 3  '$ Language- name 2 objects  0 0  Language- repeat '1 1 1  '$ Language- follow 3 step command  3 3  Language- read & follow direction  0 0  Write a sentence  0 0  Copy design  0 0  Total score  20 20        02/25/2022    9:49 AM  6CIT Screen  What Year? 0 points  What month? 0 points  What time? 0 points  Count back from 20 0 points  Months in reverse 4 points  Repeat phrase 2 points  Total Score 6 points    Immunizations Immunization History  Administered Date(s) Administered   Fluad Quad(high Dose 65+) 06/27/2019, 08/13/2020, 07/17/2021   Influenza,inj,Quad PF,6+ Mos 08/05/2017, 08/04/2018   PFIZER(Purple Top)SARS-COV-2 Vaccination 10/20/2019, 11/10/2019, 05/28/2020, 01/15/2021   Pfizer Covid-19 Vaccine Bivalent Booster 54yr & up 08/19/2021   Pneumococcal Conjugate-13 07/10/2014   Pneumococcal Polysaccharide-23 01/04/2008   Td 10/05/1996, 01/04/2008   Zoster, Live 02/01/2008    TDAP status: Due, Education has been provided regarding the importance of this vaccine. Advised may receive this vaccine at local pharmacy or Health Dept. Aware to provide a copy of the vaccination record if obtained from local pharmacy or Health Dept. Verbalized acceptance and  understanding.  Flu Vaccine status: Up to date  Pneumococcal vaccine status: Up to date  Covid-19 vaccine status: Completed vaccines  Qualifies for Shingles Vaccine? Yes   Zostavax completed Yes   Shingrix Completed?: No.    Education has been provided regarding the importance of this vaccine. Patient has been advised to call insurance company to determine out of pocket expense if they have not yet received this vaccine. Advised may also receive vaccine at local pharmacy or Health Dept. Verbalized acceptance and understanding.  Screening Tests Health Maintenance  Topic Date Due   Zoster Vaccines- Shingrix (1 of 2) Never done   TETANUS/TDAP  05/10/2024 (Originally 01/03/2018)   INFLUENZA VACCINE  05/05/2022   MAMMOGRAM  08/05/2022   Pneumonia Vaccine 81 Years old  Completed   DEXA SCAN  Completed   COVID-19 Vaccine  Completed   HPV VACCINES  Aged Out    Health Maintenance  Health Maintenance Due  Topic Date Due   Zoster Vaccines- Shingrix (1  of 2) Never done    Colorectal cancer screening: No longer required.   Mammogram status: Completed 08/05/2021. Repeat every year  Bone Density status: Completed 10/19/2016.   Lung Cancer Screening: (Low Dose CT Chest recommended if Age 2-80 years, 30 pack-year currently smoking OR have quit w/in 15years.) does not qualify.   Lung Cancer Screening Referral: no  Additional Screening:  Hepatitis C Screening: does not qualify;   Vision Screening: Recommended annual ophthalmology exams for early detection of glaucoma and other disorders of the eye. Is the patient up to date with their annual eye exam?  Yes  Who is the provider or what is the name of the office in which the patient attends annual eye exams? Dr. Jodene Nam If pt is not established with a provider, would they like to be referred to a provider to establish care? No .   Dental Screening: Recommended annual dental exams for proper oral hygiene  Community Resource Referral  / Chronic Care Management: CRR required this visit?  No   CCM required this visit?  No      Plan:     I have personally reviewed and noted the following in the patient's chart:   Medical and social history Use of alcohol, tobacco or illicit drugs  Current medications and supplements including opioid prescriptions.  Functional ability and status Nutritional status Physical activity Advanced directives List of other physicians Hospitalizations, surgeries, and ER visits in previous 12 months Vitals Screenings to include cognitive, depression, and falls Referrals and appointments  In addition, I have reviewed and discussed with patient certain preventive protocols, quality metrics, and best practice recommendations. A written personalized care plan for preventive services as well as general preventive health recommendations were provided to patient.     Kellie Simmering, LPN   [id number]/[id number]   Nurse Notes: none  Due to this being a virtual visit, the after visit summary with patients personalized plan was offered to patient via mail or my-chart.  Patient would like to access on my-chart

## 2022-02-25 NOTE — Patient Instructions (Signed)
[name] , Thank you for taking time to come for your Medicare Wellness Visit. I appreciate your ongoing commitment to your health goals. Please review the following plan we discussed and let me know if I can assist you in the future.   Screening recommendations/referrals: Colonoscopy: not required Mammogram: completed 08/05/2021, due 08/06/2022 Bone Density: completed 10/19/2016 Recommended yearly ophthalmology/optometry visit for glaucoma screening and checkup Recommended yearly dental visit for hygiene and checkup  Vaccinations: Influenza vaccine: due 05/05/2022 Pneumococcal vaccine: completed 07/10/2014 Tdap vaccine: due Shingles vaccine: discussed   Covid-19: 08/19/2021, 01/15/2021, 05/28/2020, 11/10/2019, 10/20/2019  Advanced directives: Please bring a copy of your POA (Power of Attorney) and/or Living Will to your next appointment.   Conditions/risks identified: none  Next appointment: Follow up in one year for your annual wellness visit    Preventive Care 65 Years and Older, Female Preventive care refers to lifestyle choices and visits with your health care provider that can promote health and wellness. What does preventive care include? A yearly physical exam. This is also called an annual well check. Dental exams once or twice a year. Routine eye exams. Ask your health care provider how often you should have your eyes checked. Personal lifestyle choices, including: Daily care of your teeth and gums. Regular physical activity. Eating a healthy diet. Avoiding tobacco and drug use. Limiting alcohol use. Practicing safe sex. Taking low-dose aspirin every day. Taking vitamin and mineral supplements as recommended by your health care provider. What happens during an annual well check? The services and screenings done by your health care provider during your annual well check will depend on your age, overall health, lifestyle risk factors, and family history of  disease. Counseling  Your health care provider may ask you questions about your: Alcohol use. Tobacco use. Drug use. Emotional well-being. Home and relationship well-being. Sexual activity. Eating habits. History of falls. Memory and ability to understand (cognition). Work and work Statistician. Reproductive health. Screening  You may have the following tests or measurements: Height, weight, and BMI. Blood pressure. Lipid and cholesterol levels. These may be checked every 5 years, or more frequently if you are over 83 years old. Skin check. Lung cancer screening. You may have this screening every year starting at age 83 if you have a 30-pack-year history of smoking and currently smoke or have quit within the past 15 years. Fecal occult blood test (FOBT) of the stool. You may have this test every year starting at age 83. Flexible sigmoidoscopy or colonoscopy. You may have a sigmoidoscopy every 5 years or a colonoscopy every 10 years starting at age 83. Hepatitis C blood test. Hepatitis B blood test. Sexually transmitted disease (STD) testing. Diabetes screening. This is done by checking your blood sugar (glucose) after you have not eaten for a while (fasting). You may have this done every 1-3 years. Bone density scan. This is done to screen for osteoporosis. You may have this done starting at age 83. Mammogram. This may be done every 1-2 years. Talk to your health care provider about how often you should have regular mammograms. Talk with your health care provider about your test results, treatment options, and if necessary, the need for more tests. Vaccines  Your health care provider may recommend certain vaccines, such as: Influenza vaccine. This is recommended every year. Tetanus, diphtheria, and acellular pertussis (Tdap, Td) vaccine. You may need a Td booster every 10 years. Zoster vaccine. You may need this after age 83. Pneumococcal 13-valent conjugate (PCV13) vaccine. One  dose is recommended after age 83. Pneumococcal polysaccharide (PPSV23) vaccine. One dose is recommended after age 83. Talk to your health care provider about which screenings and vaccines you need and how often you need them. This information is not intended to replace advice given to you by your health care provider. Make sure you discuss any questions you have with your health care provider. Document Released: 10/18/2015 Document Revised: 06/10/2016 Document Reviewed: 07/23/2015 Elsevier Interactive Patient Education  2017 [place] Prevention in the Home Falls can cause injuries. They can happen to people of all ages. There are many things you can do to make your home safe and to help prevent falls. What can I do on the outside of my home? Regularly fix the edges of walkways and driveways and fix any cracks. Remove anything that might make you trip as you walk through a door, such as a raised step or threshold. Trim any bushes or trees on the path to your home. Use bright outdoor lighting. Clear any walking paths of anything that might make someone trip, such as rocks or tools. Regularly check to see if handrails are loose or broken. Make sure that both sides of any steps have handrails. Any raised decks and porches should have guardrails on the edges. Have any leaves, snow, or ice cleared regularly. Use sand or salt on walking paths during winter. Clean up any spills in your garage right away. This includes oil or grease spills. What can I do in the bathroom? Use night lights. Install grab bars by the toilet and in the tub and shower. Do not use towel bars as grab bars. Use non-skid mats or decals in the tub or shower. If you need to sit down in the shower, use a plastic, non-slip stool. Keep the floor dry. Clean up any water that spills on the floor as soon as it happens. Remove soap buildup in the tub or shower regularly. Attach bath mats securely with double-sided  non-slip rug tape. Do not have throw rugs and other things on the floor that can make you trip. What can I do in the bedroom? Use night lights. Make sure that you have a light by your bed that is easy to reach. Do not use any sheets or blankets that are too big for your bed. They should not hang down onto the floor. Have a firm chair that has side arms. You can use this for support while you get dressed. Do not have throw rugs and other things on the floor that can make you trip. What can I do in the kitchen? Clean up any spills right away. Avoid walking on wet floors. Keep items that you use a lot in easy-to-reach places. If you need to reach something above you, use a strong step stool that has a grab bar. Keep electrical cords out of the way. Do not use floor polish or wax that makes floors slippery. If you must use wax, use non-skid floor wax. Do not have throw rugs and other things on the floor that can make you trip. What can I do with my stairs? Do not leave any items on the stairs. Make sure that there are handrails on both sides of the stairs and use them. Fix handrails that are broken or loose. Make sure that handrails are as long as the stairways. Check any carpeting to make sure that it is firmly attached to the stairs. Fix any carpet that is loose or worn. Avoid  having throw rugs at the top or bottom of the stairs. If you do have throw rugs, attach them to the floor with carpet tape. Make sure that you have a light switch at the top of the stairs and the bottom of the stairs. If you do not have them, ask someone to add them for you. What else can I do to help prevent falls? Wear shoes that: Do not have high heels. Have rubber bottoms. Are comfortable and fit you well. Are closed at the toe. Do not wear sandals. If you use a stepladder: Make sure that it is fully opened. Do not climb a closed stepladder. Make sure that both sides of the stepladder are locked into place. Ask  someone to hold it for you, if possible. Clearly mark and make sure that you can see: Any grab bars or handrails. First and last steps. Where the edge of each step is. Use tools that help you move around (mobility aids) if they are needed. These include: Canes. Walkers. Scooters. Crutches. Turn on the lights when you go into a dark area. Replace any light bulbs as soon as they burn out. Set up your furniture so you have a clear path. Avoid moving your furniture around. If any of your floors are uneven, fix them. If there are any pets around you, be aware of where they are. Review your medicines with your doctor. Some medicines can make you feel dizzy. This can increase your chance of falling. Ask your doctor what other things that you can do to help prevent falls. This information is not intended to replace advice given to you by your health care provider. Make sure you discuss any questions you have with your health care provider. Document Released: 07/18/2009 Document Revised: 02/27/2016 Document Reviewed: 10/26/2014 Elsevier Interactive Patient Education  2017 [employer].

## 2022-03-09 ENCOUNTER — Telehealth: Payer: Self-pay | Admitting: Family Medicine

## 2022-03-09 NOTE — Telephone Encounter (Signed)
Pt scheduled for next available appt in September

## 2022-03-09 NOTE — Telephone Encounter (Signed)
Please schedule CPE with fasting labs prior with Dr. Diona Browner.  Already had Nina with nurse.

## 2022-03-16 ENCOUNTER — Other Ambulatory Visit: Payer: Self-pay | Admitting: Hematology and Oncology

## 2022-03-16 DIAGNOSIS — Z1231 Encounter for screening mammogram for malignant neoplasm of breast: Secondary | ICD-10-CM

## 2022-03-20 ENCOUNTER — Ambulatory Visit: Payer: Medicare Other | Admitting: Physical Medicine & Rehabilitation

## 2022-03-23 ENCOUNTER — Other Ambulatory Visit: Payer: Self-pay | Admitting: Family Medicine

## 2022-04-10 ENCOUNTER — Encounter: Payer: Self-pay | Admitting: Family Medicine

## 2022-04-10 ENCOUNTER — Ambulatory Visit (HOSPITAL_COMMUNITY)
Admission: RE | Admit: 2022-04-10 | Discharge: 2022-04-10 | Disposition: A | Discharge status: Home / Self Care | Payer: Medicare Other | Source: Ambulatory Visit | Attending: Family Medicine | Admitting: Family Medicine

## 2022-04-10 ENCOUNTER — Ambulatory Visit: Payer: Medicare Other | Admitting: Family Medicine

## 2022-04-10 VITALS — BP 120/70 | HR 85 | Temp 98.7°F | Ht 63.0 in | Wt 224.4 lb

## 2022-04-10 DIAGNOSIS — M7989 Other specified soft tissue disorders: Secondary | ICD-10-CM | POA: Insufficient documentation

## 2022-04-10 DIAGNOSIS — N3281 Overactive bladder: Secondary | ICD-10-CM | POA: Diagnosis not present

## 2022-04-10 DIAGNOSIS — N898 Other specified noninflammatory disorders of vagina: Secondary | ICD-10-CM | POA: Diagnosis not present

## 2022-04-10 LAB — CBC WITH DIFFERENTIAL/PLATELET
Basophils Absolute: 0.1 10*3/uL (ref 0.0–0.1)
Basophils Relative: 0.8 % (ref 0.0–3.0)
Eosinophils Absolute: 0.2 10*3/uL (ref 0.0–0.7)
Eosinophils Relative: 2.7 % (ref 0.0–5.0)
HCT: 37.7 % (ref 36.0–46.0)
Hemoglobin: 12.6 g/dL (ref 12.0–15.0)
Lymphocytes Relative: 31.3 % (ref 12.0–46.0)
Lymphs Abs: 2.1 10*3/uL (ref 0.7–4.0)
MCHC: 33.4 g/dL (ref 30.0–36.0)
MCV: 94.7 fl (ref 78.0–100.0)
Monocytes Absolute: 0.7 10*3/uL (ref 0.1–1.0)
Monocytes Relative: 9.9 % (ref 3.0–12.0)
Neutro Abs: 3.7 10*3/uL (ref 1.4–7.7)
Neutrophils Relative %: 55.3 % (ref 43.0–77.0)
Platelets: 212 10*3/uL (ref 150.0–400.0)
RBC: 3.98 Mil/uL (ref 3.87–5.11)
RDW: 13.7 % (ref 11.5–15.5)
WBC: 6.6 10*3/uL (ref 4.0–10.5)

## 2022-04-10 LAB — COMPREHENSIVE METABOLIC PANEL
ALT: 20 U/L (ref 0–35)
AST: 29 U/L (ref 0–37)
Albumin: 4.2 g/dL (ref 3.5–5.2)
Alkaline Phosphatase: 74 U/L (ref 39–117)
BUN: 15 mg/dL (ref 6–23)
CO2: 33 mEq/L — ABNORMAL HIGH (ref 19–32)
Calcium: 9.4 mg/dL (ref 8.4–10.5)
Chloride: 103 mEq/L (ref 96–112)
Creatinine, Ser: 0.8 mg/dL (ref 0.40–1.20)
GFR: 68.32 mL/min (ref >60.00)
Glucose, Bld: 82 mg/dL (ref 70–99)
Potassium: 4 mEq/L (ref 3.5–5.1)
Sodium: 143 mEq/L (ref 135–145)
Total Bilirubin: 0.5 mg/dL (ref 0.2–1.2)
Total Protein: 6.8 g/dL (ref 6.0–8.3)

## 2022-04-10 LAB — TSH: TSH: 2.76 u[IU]/mL (ref 0.35–5.50)

## 2022-04-10 LAB — BRAIN NATRIURETIC PEPTIDE: Pro B Natriuretic peptide (BNP): 25 pg/mL (ref 0.0–100.0)

## 2022-04-10 MED ORDER — POTASSIUM CHLORIDE CRYS ER 20 MEQ PO TBCR: 20.0000 meq | EXTENDED_RELEASE_TABLET | Freq: Every day | ORAL | 3 refills | Status: DC

## 2022-04-10 MED ORDER — FUROSEMIDE 20 MG PO TABS: 20.0000 mg | ORAL_TABLET | Freq: Every day | ORAL | 3 refills | Status: DC | PRN

## 2022-04-10 MED ORDER — SOLIFENACIN SUCCINATE 10 MG PO TABS: 10.0000 mg | ORAL_TABLET | Freq: Every day | ORAL | 1 refills | Status: DC

## 2022-04-10 NOTE — Progress Notes (Signed)
Right LE venous duplex study completed. Please see CV Proc for preliminary results.  Anderson Malta  Haeleigh Streiff BS, RVT 04/10/2022 3:49 PM

## 2022-04-10 NOTE — Patient Instructions (Addendum)
Increase Vesicare to 10 mg daily at bedtime.  Please stop at the lab to have labs drawn.  We will set up [place] of leg today ASAP.  Elevate leg above heart as able.  If shortness of breath or chest pain.Marland Kitchen go to ER.

## 2022-04-10 NOTE — Progress Notes (Signed)
Patient ID: [name], female    DOB: [birth year], 81 y.o.   MRN: [id number]  This visit was conducted in person.  BP 120/70   Pulse 85   Temp 98.7 F (37.1 C) (Oral)   Ht '5\' 3"'$  (1.6 m)   Wt 224 lb 6 oz (101.8 kg)   SpO2 92%   BMI 39.75 kg/m    CC:  Chief Complaint  Patient presents with   Leg Swelling    Bilateral but Right is worse   Vaginal Itching    Cream not working   Urinary Incontinence    Vesicare 5 mg not working    Subjective:   HPI: [name] is a 81 y.o. female  with history of  HTN, RA, ILD, and superficial thrombophlebitis and pulmonary embolism presenting on 04/10/2022 for Leg Swelling (Bilateral but Right is worse), Vaginal Itching (Cream not working), and Urinary Incontinence (Vesicare 5 mg not working)   Right leg swelling, notes some in left but right worse She states  noting swelling and pain in right leg x 2 weeks.  Pain in calf and top of foot.  No  SOB and CP.  Hx of PE  2021.Marland Kitchen No on Xarelto lifleong    BP Readings from Last 3 Encounters:  04/10/22 120/70  01/27/22 122/76  01/13/22 (!) 112/50   She is on HCTZ 25 mg dailyand  losartan 25 mg daily  Relevant past medical, surgical, family and social history reviewed and updated as indicated. Interim medical history since our last visit reviewed. Allergies and medications reviewed and updated. Outpatient Medications Prior to Visit  Medication Sig Dispense Refill   acetaminophen (TYLENOL) 500 MG tablet Take 1,000 mg by mouth in the morning and at bedtime.     estradiol (ESTRACE) 0.1 MG/GM vaginal cream 0.5 g of cream intravaginally administered daily for 2 weeks, then reduce to twice weekly 24.2 g 12   folic acid (FOLVITE) 1 MG tablet Take 2 mg by mouth daily.     hydrochlorothiazide (HYDRODIURIL) 25 MG tablet Take 1 tablet by mouth once daily 90 tablet 0   hydroxychloroquine (PLAQUENIL) 200 MG tablet Take 200 mg by mouth 2 (two) times daily.     losartan (COZAAR) 25 MG  tablet Take 1 tablet (25 mg total) by mouth daily. 90 tablet 3   solifenacin (VESICARE) 5 MG tablet Take 1 tablet by mouth once daily 90 tablet 0   Vitamin D, Cholecalciferol, 25 MCG (1000 UT) TABS Take 1 tablet by mouth 2 (two) times daily.     XARELTO 20 MG TABS tablet TAKE 1 TABLET BY MOUTH  DAILY WITH SUPPER 90 tablet 3   cephALEXin (KEFLEX) 500 MG capsule Take by mouth. (Patient not taking: Reported on 02/25/2022)     predniSONE (DELTASONE) 5 MG tablet Take 1 tablet (5 mg total) by mouth daily with breakfast. 90 tablet 1   No facility-administered medications prior to visit.     Per HPI unless specifically indicated in ROS section below Review of Systems  Constitutional:  Negative for fatigue and fever.  HENT:  Negative for congestion.   Eyes:  Negative for pain.  Respiratory:  Negative for cough and shortness of breath.   Cardiovascular:  Negative for chest pain, palpitations and leg swelling.  Gastrointestinal:  Negative for abdominal pain.  Genitourinary:  Negative for dysuria and vaginal bleeding.  Musculoskeletal:  Negative for back pain.  Neurological:  Negative for syncope, light-headedness and headaches.  Psychiatric/Behavioral:  Negative  for dysphoric mood.    Objective:  BP 120/70   Pulse 85   Temp 98.7 F (37.1 C) (Oral)   Ht '5\' 3"'$  (1.6 m)   Wt 224 lb 6 oz (101.8 kg)   SpO2 92%   BMI 39.75 kg/m   Wt Readings from Last 3 Encounters:  04/10/22 224 lb 6 oz (101.8 kg)  02/25/22 205 lb (93 kg)  01/27/22 223 lb 9.6 oz (101.4 kg)      Physical Exam Vitals and nursing note reviewed.  Constitutional:      General: She is not in acute distress.    Appearance: Normal appearance. She is well-developed. She is not ill-appearing or toxic-appearing.  HENT:     Head: Normocephalic.     Right Ear: Hearing, tympanic membrane, ear canal and external ear normal.     Left Ear: Hearing, tympanic membrane, ear canal and external ear normal.     Nose: Nose normal.  Eyes:      General: Lids are normal. Lids are everted, no foreign bodies appreciated.     Conjunctiva/sclera: Conjunctivae normal.     Pupils: Pupils are equal, round, and reactive to light.  Neck:     Thyroid: No thyroid mass or thyromegaly.     Vascular: No carotid bruit.     Trachea: Trachea normal.  Cardiovascular:     Rate and Rhythm: Normal rate and regular rhythm.     Heart sounds: Normal heart sounds, S1 normal and S2 normal. No murmur heard.    No gallop.  Pulmonary:     Effort: Pulmonary effort is normal. No respiratory distress.     Breath sounds: Normal breath sounds. No wheezing, rhonchi or rales.  Abdominal:     General: Bowel sounds are normal. There is no distension or abdominal bruit.     Palpations: Abdomen is soft. There is no fluid wave or mass.     Tenderness: There is no abdominal tenderness. There is no guarding or rebound.     Hernia: No hernia is present.  Musculoskeletal:     Cervical back: Normal range of motion and neck supple.     Right lower leg: 1+ Pitting Edema present.     Left lower leg: 1+ Pitting Edema present.  Lymphadenopathy:     Cervical: No cervical adenopathy.  Skin:    General: Skin is warm and dry.     Findings: No rash.  Neurological:     Mental Status: She is alert.     Cranial Nerves: No cranial nerve deficit.     Sensory: No sensory deficit.  Psychiatric:        Mood and Affect: Mood is not anxious or depressed.        Speech: Speech normal.        Behavior: Behavior normal. Behavior is cooperative.        Judgment: Judgment normal.       Results for orders placed or performed in visit on 01/27/22  Pulmonary function test  Result Value Ref Range   FVC-Pre 2.01 L   FVC-%Pred-Pre 83 %   FEV1-Pre 1.48 L   FEV1-%Pred-Pre 83 %   FEV6-Pre 2.01 L   FEV6-%Pred-Pre 88 %   Pre FEV1/FVC ratio 73 %   FEV1FVC-%Pred-Pre 99 %   Pre FEV6/FVC Ratio 100 %   FEV6FVC-%Pred-Pre 105 %   FEF 25-75 Pre 0.95 L/sec   FEF2575-%Pred-Pre 76 %   DLCO unc  15.37 ml/min/mmHg   DLCO unc % pred 85 %  DLCO cor 15.28 ml/min/mmHg   DLCO cor % pred 84 %   DL/VA 4.05 ml/min/mmHg/L   DL/VA % pred 98 %     COVID 19 screen:  No recent travel or known exposure to COVID19 The patient denies respiratory symptoms of COVID 19 at this time. The importance of social distancing was discussed today.   Assessment and Plan Problem List Items Addressed This Visit     OAB (overactive bladder)    Chronic inadequate control  Increase Vesicare to 10 mg daily at bedtime.      Right leg swelling - Primary    Acute on chronic lower extremity edema, right worse than left No clear sign of cellulitis. Will evaluate with ultrasound for DVT.  Evaluate with labs for secondary causes. We will set up [place] of leg today ASAP.  Elevate leg above heart as able.  If shortness of breath or chest pain.Marland Kitchen go to ER.       Relevant Orders   CBC with Differential/Platelet (Completed)   TSH (Completed)   Comprehensive metabolic panel (Completed)   Brain natriuretic peptide (Completed)   VAS [place] LOWER EXTREMITY VENOUS (DVT) (Completed)   Vaginal irritation    Chronic intermittent Most likely secondary to urinary incontinence and pads. No vaginal discharge, no clear indication of yeast or bacterial infection. Possibly related to estrogen deficiency and postmenopausal state.      Meds ordered this encounter  Medications   solifenacin (VESICARE) 10 MG tablet    Sig: Take 1 tablet (10 mg total) by mouth daily.    Dispense:  90 tablet    Refill:  1   furosemide (LASIX) 20 MG tablet    Sig: Take 1 tablet (20 mg total) by mouth daily as needed for fluid or edema. Each time you take furosemide, please take a potassium tablet.    Dispense:  30 tablet    Refill:  3   potassium chloride SA (KLOR-CON M) 20 MEQ tablet    Sig: Take 1 tablet (20 mEq total) by mouth daily. Take only when taking furosemide.    Dispense:  30 tablet    Refill:  3   Orders Placed This Encounter   Procedures   CBC with Differential/Platelet   TSH   Comprehensive metabolic panel   Brain natriuretic peptide       Eliezer Lofts, MD

## 2022-04-22 ENCOUNTER — Telehealth: Payer: Self-pay | Admitting: Family Medicine

## 2022-04-22 ENCOUNTER — Encounter: Payer: Self-pay | Admitting: Family Medicine

## 2022-04-22 NOTE — Telephone Encounter (Signed)
Patient called and stated that her right leg is swollen and has gotten worse. Patient has been triaged.

## 2022-04-22 NOTE — Telephone Encounter (Signed)
Rachel Hummingbird RN with access called and pt has worsening redness of 2 " to rt lower leg,worsening swelling and painful and warm to touch; pain level now is 5. Pt does not have fever or chills. Pt had [place] of leg on 04/10/22. I spoke with pt and no available appts at [facility] this afternoon; pt refuses ED. Pt wants appt at [facility]. Pt said the redness is 12" (not 2 ")from under rt knee to ankle. Pt is taking Xarelto 20 mg. Daily. Dr Diona Browner is out of office. I spoke with Romilda Garret NP and he said if pt refuses ED disposition to schedule in AM with ED precautions. I scheduled pt with Romilda Garret NP on 04/23/22 at 10:40 and gave pt ED precautions and pt voiced understanding. Sending note to Romilda Garret NP.    Byron [name] - Client TELEPHONE ADVICE RECORD [facility] Patient Name: [name] Gender: Female DOB: 1941/04/21 Age: 81 Y 24 D Return Phone Number: [phone] (Primary) Address: City/ State/ Zip: [place]  [address] Client [name] - Client Client Site [name] Provider Eliezer Lofts - MD Contact Type Call Who Is Calling Patient / Member / Family / Caregiver Call Type Triage / Clinical Relationship To Patient Self Return Phone Number [phone] (Primary) Chief Complaint Leg Injury Reason for Call Symptomatic / Request for [name] states her right leg is swollen and it has gotten worse. Translation No Nurse Assessment Nurse: Rolin Barry, RN, Levada Dy Date/Time Eilene Ghazi Time): 04/22/2022 2:38:55 PM Confirm and document reason for call. If symptomatic, describe symptoms. ---Caller states her right leg is swollen and it has gotten worse. Sx started 2 weeks ago, was seen in the PCP office, sent to see if she had clots and was placed on lasix and potassium. Does the patient have any new or worsening symptoms? ---Yes Will a triage be completed? ---Yes Related visit  to physician within the last 2 weeks? ---Yes Does the PT have any chronic conditions? (i.e. diabetes, asthma, this includes High risk factors for pregnancy, etc.) ---Yes List chronic conditions. ---HTN RA Is this a behavioral health or substance abuse call? ---No Guidelines Guideline Title Affirmed Question Affirmed Notes Nurse Date/Time Eilene Ghazi Time) Leg Swelling and Edema [1] Red area or streak [2] large (> 2 in. or 5 cm) [name], RN, Levada Dy 04/22/2022 2:41:40 PM Disp. Time Eilene Ghazi Time) Disposition Final User 04/22/2022 2:43:23 PM See HCP within 4 Hours (or PCP triage) Yes [name], RN, Levada Dy PLEASE NOTE: All timestamps contained within this report are represented as [place] Standard Time. CONFIDENTIALTY NOTICE: This fax transmission is intended only for the addressee. It contains information that is legally privileged, confidential or otherwise protected from use or disclosure. If you are not the intended recipient, you are strictly prohibited from reviewing, disclosing, copying using or disseminating any of this information or taking any action in reliance on or regarding this information. If you have received this fax in error, please notify us immediately by telephone so that we can arrange for its return to [place]. Phone: [phone], Toll-Free: [phone], Fax: [phone] Page: 2 of 2 Call Id: [id number] Final Disposition 04/22/2022 2:43:23 PM See HCP within 4 Hours (or PCP triage) Yes [name], RN, Cindee Lame Disagree/Comply Comply Caller Understands Yes PreDisposition Did not know what to do Care Advice Given Per Guideline SEE HCP (OR PCP TRIAGE) WITHIN 4 HOURS: * IF OFFICE WILL BE OPEN: You need to be seen  within the next 3 or 4 hours. Call your doctor (or NP/PA) now or as soon as the office opens. CALL BACK IF: * You become worse CARE ADVICE given per Leg Swelling and Edema (Adult) guideline. Comments User: [name], RN Date/Time Eilene Ghazi Time): 04/22/2022 2:53:41  PM Called the backline, spoke with Aaron Edelman, he trasnferred me to [place]. User: [name], RN Date/Time Eilene Ghazi Time): 04/22/2022 2:58:18 PM Caller was trasnferred to Mearl Latin, nurse in the office for further assistance. Referrals Warm transfer to backlin

## 2022-04-22 NOTE — Telephone Encounter (Signed)
Will evaluate in office

## 2022-04-23 ENCOUNTER — Telehealth: Payer: Self-pay | Admitting: Family Medicine

## 2022-04-23 ENCOUNTER — Ambulatory Visit: Payer: Medicare Other | Admitting: Nurse Practitioner

## 2022-04-23 VITALS — BP 110/80 | HR 83 | Temp 97.2°F | Resp 18 | Ht 63.0 in | Wt 225.2 lb

## 2022-04-23 DIAGNOSIS — L03115 Cellulitis of right lower limb: Secondary | ICD-10-CM | POA: Diagnosis not present

## 2022-04-23 MED ORDER — CEFTRIAXONE SODIUM 1 G IJ SOLR
1.0000 g | Freq: Once | INTRAMUSCULAR | Status: AC
  Administered 2022-04-23: 1 g via INTRAMUSCULAR

## 2022-04-23 MED ORDER — DOXYCYCLINE HYCLATE 100 MG PO TABS: 100.0000 mg | ORAL_TABLET | Freq: Two times a day (BID) | ORAL | 0 refills | Status: AC

## 2022-04-23 NOTE — Assessment & Plan Note (Signed)
Patient was seen and started on diuretic and had a ultrasound to rule out DVT.  Patient's ultrasound was negative and patient is already chronically anticoagulated for other thromboembolisms.  Increase in symptoms.  Given look of the leg in nature we will treat for cellulitis.  Give Rocephin 1 g IM x1 dose in office.  Patient started on doxycycline twice daily can start tomorrow since Rocephin given in office.  Did give strict signs and symptoms when to be seen over the weekend in urgent or emergent care settings.  Patient will follow-up closely with me on Monday to recheck.  Patient had 2 daughters at bedside that agreed with plan along with patient.

## 2022-04-23 NOTE — Telephone Encounter (Signed)
Mrs. Gause was seen by Romilda Garret on 04/23/2022.

## 2022-04-23 NOTE — Progress Notes (Signed)
Acute Office Visit  Subjective:     Patient ID: [name], female    DOB: 05/12/1941, 81 y.o.   MRN: [id number]  Chief Complaint  Patient presents with   Edema    Of right lower leg. worsening redness, worsening swelling and painful and warm to touch. Last seen for this on 04/10/22 by Dr Diona Browner.    HPI Patient is in today for Leg swelling  States she was seen on 04/10/2022 by primary care provider. States that she was ruled out for DVT and is currently on blood thinner.  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 States that the swelling, redness, and pain has increased at that office visit. States the pain is to palpation and starting to hurt without palpation.  Patient does not remember doing any injury or getting bit by a bug or insect as of late.  States she does have animal at the house but does not jump up on her legs.  Review of Systems  Constitutional:  Negative for chills and fever.  Respiratory:  Negative for shortness of breath (Outside of baseline).   Cardiovascular:  Positive for leg swelling. Negative for chest pain.  Gastrointestinal:  Negative for nausea and vomiting.  Musculoskeletal:        Positive for leg pain  Skin:        "+" hurts "+" erythema  Neurological:  Negative for tingling and sensory change.        Objective:    BP 110/80   Pulse 83   Temp (!) 97.2 F (36.2 C)   Resp 18   Ht '5\' 3"'$  (1.6 m)   Wt 225 lb 4 oz (102.2 kg)   SpO2 95%   BMI 39.90 kg/m    Physical Exam Constitutional:      Appearance: Normal appearance. She is obese.  Cardiovascular:     Rate and Rhythm: Normal rate and regular rhythm.     Pulses:          Dorsalis pedis pulses are 1+ on the right side and 1+ on the left side.     Heart sounds: Normal heart sounds.  Pulmonary:     Effort: Pulmonary  effort is normal.     Breath sounds: Normal breath sounds.  Musculoskeletal:     Right lower leg: 2+ Edema present.     Left lower leg: 1+ Edema present.  Skin:    Findings: Erythema present.     Comments: See clinical photo  Neurological:     Mental Status: She is alert.      No results found for any visits on 04/23/22.      Assessment & Plan:   Problem List Items Addressed This Visit       Other   Cellulitis of right lower extremity - Primary    Patient was seen and started on diuretic and had a ultrasound to rule out DVT.  Patient's ultrasound was negative and patient is already chronically anticoagulated for other thromboembolisms.  Increase in symptoms.  Given look of the leg in nature we will treat for cellulitis.  Give Rocephin 1 g IM x1 dose in office.  Patient started on doxycycline twice daily can start tomorrow since Rocephin given in office.  Did give strict signs and symptoms when to be seen over  the weekend in urgent or emergent care settings.  Patient will follow-up closely with me on Monday to recheck.  Patient had 2 daughters at bedside that agreed with plan along with patient.      Relevant Medications   doxycycline (VIBRA-TABS) 100 MG tablet    Meds ordered this encounter  Medications   cefTRIAXone (ROCEPHIN) injection 1 g   doxycycline (VIBRA-TABS) 100 MG tablet    Sig: Take 1 tablet (100 mg total) by mouth 2 (two) times daily for 7 days.    Dispense:  14 tablet    Refill:  0    Order Specific Question:   Supervising Provider    Answer:   Loura Pardon A [[id number]]    Return in about 4 days (around 04/27/2022) for Wound recheck .  Romilda Garret, NP

## 2022-04-23 NOTE — Patient Instructions (Signed)
Nice to see you today Try and keep the leg elevated intermittently through out the day It is ok to use lotion on the leg for the itching If you develop fever, chills, greatly increased pain level, loss of function in the left or loss of feeling go to the nearest emergency department   I want to see you back in office on Monday, sooner if you need me

## 2022-04-23 NOTE — Telephone Encounter (Signed)
At check out ,patient expressed interest in switching her PCP ,and seeing NP Cable going forward. Would this be a change that you both agree upon? Thank you.

## 2022-04-26 ENCOUNTER — Emergency Department (HOSPITAL_COMMUNITY)
Admission: EM | Admit: 2022-04-26 | Discharge: 2022-04-26 | Disposition: A | Discharge status: Home / Self Care | Payer: Medicare Other | Attending: Emergency Medicine | Admitting: Emergency Medicine

## 2022-04-26 ENCOUNTER — Other Ambulatory Visit: Payer: Self-pay

## 2022-04-26 ENCOUNTER — Emergency Department (HOSPITAL_COMMUNITY): Payer: Medicare Other

## 2022-04-26 ENCOUNTER — Encounter (HOSPITAL_COMMUNITY): Payer: Self-pay

## 2022-04-26 DIAGNOSIS — M7989 Other specified soft tissue disorders: Secondary | ICD-10-CM

## 2022-04-26 DIAGNOSIS — R600 Localized edema: Secondary | ICD-10-CM | POA: Diagnosis present

## 2022-04-26 DIAGNOSIS — R609 Edema, unspecified: Secondary | ICD-10-CM

## 2022-04-26 DIAGNOSIS — L03115 Cellulitis of right lower limb: Secondary | ICD-10-CM | POA: Insufficient documentation

## 2022-04-26 LAB — CBC WITH DIFFERENTIAL/PLATELET
Abs Immature Granulocytes: 0.02 10*3/uL (ref 0.00–0.07)
Basophils Absolute: 0 10*3/uL (ref 0.0–0.1)
Basophils Relative: 0 %
Eosinophils Absolute: 0.3 10*3/uL (ref 0.0–0.5)
Eosinophils Relative: 4 %
HCT: 40.5 % (ref 36.0–46.0)
Hemoglobin: 13 g/dL (ref 12.0–15.0)
Immature Granulocytes: 0 %
Lymphocytes Relative: 33 %
Lymphs Abs: 2.7 10*3/uL (ref 0.7–4.0)
MCH: 31 pg (ref 26.0–34.0)
MCHC: 32.1 g/dL (ref 30.0–36.0)
MCV: 96.7 fL (ref 80.0–100.0)
Monocytes Absolute: 0.6 10*3/uL (ref 0.1–1.0)
Monocytes Relative: 8 %
Neutro Abs: 4.3 10*3/uL (ref 1.7–7.7)
Neutrophils Relative %: 55 %
Platelets: 238 10*3/uL (ref 150–400)
RBC: 4.19 MIL/uL (ref 3.87–5.11)
RDW: 13.2 % (ref 11.5–15.5)
WBC: 8 10*3/uL (ref 4.0–10.5)
nRBC: 0 % (ref 0.0–0.2)

## 2022-04-26 LAB — BASIC METABOLIC PANEL
Anion gap: 6 (ref 5–15)
BUN: 12 mg/dL (ref 8–23)
CO2: 24 mmol/L (ref 22–32)
Calcium: 8.8 mg/dL — ABNORMAL LOW (ref 8.9–10.3)
Chloride: 108 mmol/L (ref 98–111)
Creatinine, Ser: 0.76 mg/dL (ref 0.44–1.00)
GFR, Estimated: >60 mL/min (ref >60)
Glucose, Bld: 119 mg/dL — ABNORMAL HIGH (ref 70–99)
Potassium: 3.8 mmol/L (ref 3.5–5.1)
Sodium: 138 mmol/L (ref 135–145)

## 2022-04-26 LAB — BRAIN NATRIURETIC PEPTIDE: B Natriuretic Peptide: 35 pg/mL (ref 0.0–100.0)

## 2022-04-26 MED ORDER — CEPHALEXIN 250 MG PO CAPS
1000.0000 mg | ORAL_CAPSULE | Freq: Once | ORAL | Status: AC
  Administered 2022-04-26: 1000 mg via ORAL
  Filled 2022-04-26: qty 4

## 2022-04-26 MED ORDER — CEPHALEXIN 500 MG PO CAPS: 500.0000 mg | ORAL_CAPSULE | Freq: Four times a day (QID) | ORAL | 0 refills | Status: DC

## 2022-04-26 NOTE — ED Triage Notes (Signed)
Complains of bilateral leg swelling for a few weeks. Had DVT study which was negative but legs are getting worse and more red.  Reports difficulty sleeping last night.  Patient appears sob of breath but denies being sob and states "ive been sob for years". Denies [facility]

## 2022-04-26 NOTE — Discharge Instructions (Addendum)
It was our pleasure to provide your ER care today - we hope that you feel better.  Elevate legs as much as possible when not up/walking.  Limit salt intake. Consider compression stockings.   Take keflex (antibiotic) as prescribed.   Follow up with your doctor tomorrow as planned.  Discuss possible referral to vascular/vein specialist then.   Return to ER if worse, new symptoms, increased trouble breathing, spreading redness, severe pain, high fevers, or other concern.

## 2022-04-26 NOTE — ED Provider Triage Note (Signed)
Emergency Medicine Provider Triage Evaluation Note  [name] , a 81 y.o. female  was evaluated in triage.  Pt complains of BLE swelling onset 7/7.  Has been evaluated with her PCP and sent a prescription for doxy.  Had a DVT ultrasound study completed on 7/7 to the right lower extremity only.  Patient was started on a diuretic.  Patient was given an injection of 1 g of Rocephin and sent a prescription for doxycycline due to presumed cellulitis of the leg.  Patient was informed to have a close follow-up with primary care provider on 04/27/2022.  Denies chest pain, shortness of breath.  Review of Systems  Positive: As per HPI Negative:   Physical Exam  BP 134/86 (BP Location: Left Arm)   Pulse 98   Temp 99.7 F (37.6 C) (Oral)   Resp 20   Ht '5\' 3"'$  (1.6 m)   Wt 104.3 kg   SpO2 98%   BMI 40.74 kg/m  Gen:   Awake, no distress   Resp:  Normal effort  MSK:   Moves extremities without difficulty  Other:  Erythema noted to right shin.  No erythema extending beyond the right knee.  Erythema noted to left lateral malleolus.  Trace to 1+ pitting edema noted bilaterally with tenderness to palpation noted to the area.  Medical Decision Making  Medically screening exam initiated at 11:56 AM.  Appropriate orders placed.  SHADY PADRON was informed that the remainder of the evaluation will be completed by another provider, this initial triage assessment does not replace that evaluation, and the importance of remaining in the ED until their evaluation is complete.  Work-up initiated   Genell Thede A, PA-C 04/26/22 1206

## 2022-04-26 NOTE — ED Provider Notes (Signed)
[facility] EMERGENCY DEPARTMENT Provider Note   CSN: [id number] Arrival date & time: 04/26/22  1134     History  Chief Complaint  Patient presents with   Leg Swelling    [name] is a 81 y.o. female.  Patient with right leg swelling in past few weeks. 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 Pcp had given rx lasix and doxycycline but redness and swelling persist - no other new meds or personal product use on area. Today noted a small patch of redness to left anterior lower leg as well. No fever or chills. No vomiting. No chest pain or sob.   The history is provided by the patient, medical records and a relative.       Home Medications Prior to Admission medications   Medication Sig Start Date End Date Taking? Authorizing Provider  acetaminophen (TYLENOL) 500 MG tablet Take 1,000 mg by mouth in the morning and at bedtime.    [provider]  doxycycline (VIBRA-TABS) 100 MG tablet Take 1 tablet (100 mg total) by mouth 2 (two) times daily for 7 days. 04/23/22 04/30/22  Michela Pitcher, NP  estradiol (ESTRACE) 0.1 MG/GM vaginal cream 0.5 g of cream intravaginally administered daily for 2 weeks, then reduce to twice weekly 10/30/21   [name], MD  folic acid (FOLVITE) 1 MG tablet Take 2 mg by mouth daily.    [provider]  furosemide (LASIX) 20 MG tablet Take 1 tablet (20 mg total) by mouth daily as needed for fluid or edema. Each time you take furosemide, please take a potassium tablet. 04/10/22   Bedsole, Amy E, MD  hydroxychloroquine (PLAQUENIL) 200 MG tablet Take 200 mg by mouth 2 (two) times daily. 12/18/20   [provider]  losartan (COZAAR) 25 MG tablet Take 1 tablet (25 mg total) by mouth daily. 06/27/21   Bedsole, Amy E, MD  potassium chloride SA (KLOR-CON M) 20 MEQ tablet Take 1 tablet (20 mEq total) by mouth daily. Take only when taking furosemide. 04/10/22   Bedsole, Amy E, MD   solifenacin (VESICARE) 10 MG tablet Take 1 tablet (10 mg total) by mouth daily. 04/10/22   [name], MD  Vitamin D, Cholecalciferol, 25 MCG (1000 UT) TABS Take 1 tablet by mouth 2 (two) times daily.    [provider]  XARELTO 20 MG TABS tablet TAKE 1 TABLET BY MOUTH  DAILY WITH SUPPER 07/25/21   Nicholas Lose, MD      Allergies    Codeine and Hydrocodone bit-homatrop mbr    Review of Systems   Review of Systems  Constitutional:  Negative for chills and fever.  Eyes:  Negative for redness.  Respiratory:  Negative for shortness of breath.   Cardiovascular:  Positive for leg swelling. Negative for chest pain.  Skin:        Erythema to skin of lower leg  Neurological:  Negative for headaches.  Hematological:        On xarelto    Physical Exam Updated Vital Signs BP 138/71   Pulse 70   Temp 98.9 F (37.2 C)   Resp 20   Ht 1.6 m ('5\' 3"'$ )   Wt 104.3 kg   SpO2 97%   BMI 40.74 kg/m  Physical Exam Vitals and nursing note reviewed.  Constitutional:      Appearance: Normal appearance. She is well-developed.  HENT:     Head: Atraumatic.  Nose: Nose normal.     Mouth/Throat:     Mouth: Mucous membranes are moist.  Eyes:     General: No scleral icterus.    Conjunctiva/sclera: Conjunctivae normal.  Neck:     Trachea: No tracheal deviation.  Cardiovascular:     Rate and Rhythm: Normal rate and regular rhythm.     Pulses: Normal pulses.     Heart sounds: Normal heart sounds. No murmur heard.    No friction rub. No gallop.  Pulmonary:     Effort: Pulmonary effort is normal. No respiratory distress.     Breath sounds: Normal breath sounds.  Genitourinary:    Comments: No cva tenderness.  Musculoskeletal:     Cervical back: Normal range of motion and neck supple. No rigidity. No muscular tenderness.     Comments: Edema to bilateral legs below knees, right more so than left. RLE is diffusely, mildly erythematous, w mild increased warmth. There is small area to  anterior aspect of left lower leg above ankle that is similarly mildly erythematous and warm. Distal pulses palp bil   Skin:    General: Skin is warm and dry.     Findings: No rash.  Neurological:     Mental Status: She is alert.     Comments: Alert, speech normal.   Psychiatric:        Mood and Affect: Mood normal.     ED Results / Procedures / Treatments   Labs (all labs ordered are listed, but only abnormal results are displayed) Results for orders placed or performed during the hospital encounter of 18/84/16  Basic metabolic panel  Result Value Ref Range   Sodium 138 135 - 145 mmol/L   Potassium 3.8 3.5 - 5.1 mmol/L   Chloride 108 98 - 111 mmol/L   CO2 24 22 - 32 mmol/L   Glucose, Bld 119 (H) 70 - 99 mg/dL   BUN 12 8 - 23 mg/dL   Creatinine, Ser 0.76 0.44 - 1.00 mg/dL   Calcium 8.8 (L) 8.9 - 10.3 mg/dL   GFR, Estimated >60 >60 mL/min   Anion gap 6 5 - 15  CBC with Differential  Result Value Ref Range   WBC 8.0 4.0 - 10.5 K/uL   RBC 4.19 3.87 - 5.11 MIL/uL   Hemoglobin 13.0 12.0 - 15.0 g/dL   HCT 40.5 36.0 - 46.0 %   MCV 96.7 80.0 - 100.0 fL   MCH 31.0 26.0 - 34.0 pg   MCHC 32.1 30.0 - 36.0 g/dL   RDW 13.2 11.5 - 15.5 %   Platelets 238 150 - 400 K/uL   nRBC 0.0 0.0 - 0.2 %   Neutrophils Relative % 55 %   Neutro Abs 4.3 1.7 - 7.7 K/uL   Lymphocytes Relative 33 %   Lymphs Abs 2.7 0.7 - 4.0 K/uL   Monocytes Relative 8 %   Monocytes Absolute 0.6 0.1 - 1.0 K/uL   Eosinophils Relative 4 %   Eosinophils Absolute 0.3 0.0 - 0.5 K/uL   Basophils Relative 0 %   Basophils Absolute 0.0 0.0 - 0.1 K/uL   Immature Granulocytes 0 %   Abs Immature Granulocytes 0.02 0.00 - 0.07 K/uL  Brain natriuretic peptide  Result Value Ref Range   B Natriuretic Peptide 35.0 0.0 - 100.0 pg/mL   VAS [place] LOWER EXTREMITY VENOUS (DVT)  Result Date: 04/11/2022  Lower Venous DVT Study Patient Name:  [name]  Date of Exam:   04/10/2022 Medical Rec #:  [id number]             Accession #:     [id number] Date of Birth: [birth year]             Patient Gender: F Patient Age:   49 years Exam Location:  [facility] Procedure:      VAS [place] LOWER EXTREMITY VENOUS (DVT) Referring Phys: AMY BEDSOLE --------------------------------------------------------------------------------  Indications: Swelling.  Comparison Study: 04/02/20, negative for DVT bilaterally. Performing Technologist: Bobetta Lime BS, RVT  Examination Guidelines: A complete evaluation includes B-mode imaging, spectral Doppler, color Doppler, and power Doppler as needed of all accessible portions of each vessel. Bilateral testing is considered an integral part of a complete examination. Limited examinations for reoccurring indications may be performed as noted. The reflux portion of the exam is performed with the patient in reverse Trendelenburg.  +---------+---------------+---------+-----------+----------+-------------------+ RIGHT    CompressibilityPhasicitySpontaneityPropertiesThrombus Aging      +---------+---------------+---------+-----------+----------+-------------------+ CFV      Full           Yes      Yes                                      +---------+---------------+---------+-----------+----------+-------------------+ SFJ      Full                                                             +---------+---------------+---------+-----------+----------+-------------------+ FV Prox  Full                                                             +---------+---------------+---------+-----------+----------+-------------------+ FV Mid   Full                                                             +---------+---------------+---------+-----------+----------+-------------------+ FV DistalFull                                                             +---------+---------------+---------+-----------+----------+-------------------+ PFV      Full                                                              +---------+---------------+---------+-----------+----------+-------------------+ POP      Full           Yes      Yes                                      +---------+---------------+---------+-----------+----------+-------------------+  PTV                                                   Not well visualized +---------+---------------+---------+-----------+----------+-------------------+ PERO                                                  Not well visualized +---------+---------------+---------+-----------+----------+-------------------+   Right Technical Findings: The right calf veins were not visualized well in B-mode, however color imaging demonstrated patency.  +----+---------------+---------+-----------+----------+--------------+ LEFTCompressibilityPhasicitySpontaneityPropertiesThrombus Aging +----+---------------+---------+-----------+----------+--------------+ CFV Full           Yes      Yes                                 +----+---------------+---------+-----------+----------+--------------+     *See table(s) above for measurements and observations. Electronically signed by Orlie Pollen on 04/11/2022 at 11:19:25 AM.    Final      EKG None  Radiology DG Chest 2 View  Result Date: 04/26/2022 CLINICAL DATA:  leg swelling EXAM: CHEST - 2 VIEW COMPARISON:  Chest x-ray 11/23/2021 FINDINGS: The heart and mediastinal contours are within normal limits. Aortic calcification. No focal consolidation. Chronic coarsened markings with no overt pulmonary edema. No pleural effusion. No pneumothorax. No acute osseous abnormality. IMPRESSION: 1. No active cardiopulmonary disease. 2.  Aortic Atherosclerosis (ICD10-I70.0). Electronically Signed   By: Iven Finn M.D.   On: 04/26/2022 20:08    Procedures Procedures    Medications Ordered in ED Medications - No data to display  ED Course/ Medical Decision Making/ A&P                           Medical  Decision Making Problems Addressed: Cellulitis of right leg: acute illness or injury with systemic symptoms Leg swelling: acute illness or injury Peripheral edema: acute illness or injury  Amount and/or Complexity of Data Reviewed Independent Historian:     Details: family, hx External Data Reviewed: labs, radiology and notes. Labs: ordered. Decision-making details documented in ED Course. Radiology: ordered and independent interpretation performed. Decision-making details documented in ED Course.  Risk Prescription drug management.   Iv ns. Continuous pulse ox and cardiac monitoring. Labs ordered/sent. Imaging ordered.   Reviewed nursing notes and prior charts for additional history. External reports reviewed. Additional history from:family member.   Cardiac monitor: sinus rhythm, rate 70.  Labs reviewed/interpreted by me - wbc normal. Bnp normal.  Recent u/s  reviewed/interpreted by me - no dvt.   CXR reviewed/interpreted by me - no edema.   Pt/family indicate has appt w pcp tomorrow AM. Will add keflex, rec leg elevation, f/u tomorrow.   Keflex po.         Final Clinical Impression(s) / ED Diagnoses Final diagnoses:  None    Rx / DC Orders ED Discharge Orders     None         Lajean Saver, MD 04/26/22 2056

## 2022-04-27 ENCOUNTER — Ambulatory Visit: Payer: Medicare Other | Admitting: Nurse Practitioner

## 2022-04-27 VITALS — BP 130/58 | HR 84 | Temp 96.8°F | Resp 18 | Ht 63.0 in | Wt 222.0 lb

## 2022-04-27 DIAGNOSIS — Z09 Encounter for follow-up examination after completed treatment for conditions other than malignant neoplasm: Secondary | ICD-10-CM | POA: Diagnosis not present

## 2022-04-27 DIAGNOSIS — L03115 Cellulitis of right lower limb: Secondary | ICD-10-CM | POA: Diagnosis not present

## 2022-04-27 NOTE — Telephone Encounter (Signed)
She is a very pleasant lady... other than age and moderately complicated history, there is no reason she cannot change to [facility] if he is agreeable.

## 2022-04-27 NOTE — Assessment & Plan Note (Signed)
Patient was seen and evaluated emergency department did review labs, imaging, emergency physicians note.

## 2022-04-27 NOTE — Progress Notes (Signed)
   Acute Office Visit  Subjective:     Patient ID: [name], female    DOB: 20-Feb-1941, 81 y.o.   MRN: [id number]  Chief Complaint  Patient presents with   follow up on cellulitis    On RLL, now her left leg is having swelling/redness. Went to ER on 04/26/22. Not better.    HPI Patient is in today for Wound recheck  Patient was originally seen on 04/10/2022 by primary care provider for right leg swelling.  Patient had blood work done and ultrasound to rule out DVT that was negative. Patient presented office on 04/23/2022 for increasing swelling and redness to the leg along with erythema.  She was evaluated by me we gave her 1 g Rocephin IM and started on doxycycline 100 mg twice daily with strict return precautions. Patient went to the emergency department on 04/26/2022 was evaluated in [facility] emergency department.  They did obtain labs chest x-ray and added on Keflex medication.  Patient presents for follow-up as originally scheduled at last office visit.  Patient states redness has not gotten any better.  States now is moving to the left lower extremity see clinical photos.  Patient was unhappy with her emergency department visit and has not started the Keflex yet.  Encourage patient to pick Keflex up and take it along with the doxycycline.  Patient states that she does not feel like the IM dose of Rocephin did any good.  Review of Systems  Constitutional:  Positive for chills. Negative for fever.  Respiratory:  Negative for shortness of breath.   Cardiovascular:  Positive for leg swelling. Negative for chest pain.  Gastrointestinal:  Negative for diarrhea, nausea and vomiting.        Objective:    BP (!) 130/58   Pulse 84   Temp (!) 96.8 F (36 C)   Resp 18   Ht '5\' 3"'$  (1.6 m)   Wt 222 lb (100.7 kg)   SpO2 96%   BMI 39.33 kg/m    Physical Exam     No results found for any visits on 04/27/22.      Assessment & Plan:   Problem List Items Addressed  This Visit       Other   Cellulitis of right lower extremity    Patient returns for wound recheck.  Not any better.  Was evaluated in the emergency department and prescribed Keflex 500 mg 4 times daily.  Encourage patient to pick up prescription start taking today to get all 4 doses and if possible.  Patient to continue doxycycline 100 mg twice daily until finishes that also.  Deferred doing IM injection of Rocephin in office as patient will start cephalosporin after office visit.  Follow-up in 3 to 4 days to ensure improvement      Hospital discharge follow-up - Primary    Patient was seen and evaluated emergency department did review labs, imaging, emergency physicians note.       No orders of the defined types were placed in this encounter.   Return in about 4 days (around 05/01/2022) for Wound recheck.  Romilda Garret, NP

## 2022-04-27 NOTE — Assessment & Plan Note (Signed)
Patient returns for wound recheck.  Not any better.  Was evaluated in the emergency department and prescribed Keflex 500 mg 4 times daily.  Encourage patient to pick up prescription start taking today to get all 4 doses and if possible.  Patient to continue doxycycline 100 mg twice daily until finishes that also.  Deferred doing IM injection of Rocephin in office as patient will start cephalosporin after office visit.  Follow-up in 3 to 4 days to ensure improvement

## 2022-04-27 NOTE — Patient Instructions (Signed)
Nice to see you today Follow up with me OR Dr. Diona Browner on Friday this week Continue taking the doxycycline and add on the keflex Follow up sooner if you need me

## 2022-04-29 NOTE — Telephone Encounter (Signed)
She comes in on 7/28 for a wound recheck with Matt, would I still schedule a TOC after that appointment?

## 2022-04-29 NOTE — Telephone Encounter (Signed)
That is fine 

## 2022-04-29 NOTE — Telephone Encounter (Signed)
Patient has CPE scheduled with Dr Diona Browner in September, please reschedule that to Matt's schedule for TOC/CPE at that time. No lab work ahead of time though can cancel that part.

## 2022-05-01 ENCOUNTER — Ambulatory Visit: Payer: Medicare Other | Admitting: Nurse Practitioner

## 2022-05-01 VITALS — BP 128/56 | HR 86 | Temp 97.1°F | Resp 18 | Ht 63.0 in | Wt 222.2 lb

## 2022-05-01 DIAGNOSIS — L299 Pruritus, unspecified: Secondary | ICD-10-CM | POA: Diagnosis not present

## 2022-05-01 DIAGNOSIS — R600 Localized edema: Secondary | ICD-10-CM | POA: Diagnosis not present

## 2022-05-01 MED ORDER — CETIRIZINE HCL 10 MG PO TABS: 10.0000 mg | ORAL_TABLET | Freq: Every day | ORAL | 11 refills | Status: DC

## 2022-05-01 NOTE — Progress Notes (Signed)
Established Patient Office Visit  Subjective   Patient ID: [name], female    DOB: 09/18/1941  Age: 81 y.o. MRN: [id number]  Chief Complaint  Patient presents with   follow up edema of legs    Not better per patient. She is also now having bad itching of her back.    HPI  Patient was originally seen on 04/10/2022 for leg swelling.  During an office visit an ultrasound was ordered to rule out DVT which came back negative.  Patient followed up in office on 04/23/2022 with me with diagnosis of cellulitis patient was given injection of ceftriaxone and started on doxycycline.  Patient ended up going to emergency department on 04/26/2022 was evaluated and antibiotic of Keflex was added on.  Patient followed up with me on 04/27/2022 had not started Keflex at that time recommended she started Keflex.  Patient followed up in office today will be finishing her Keflex prescription today with no great improvement.    Review of Systems  Constitutional:  Positive for chills. Negative for fever.  Respiratory:  Negative for shortness of breath.   Cardiovascular:  Positive for leg swelling. Negative for chest pain.      Objective:     BP (!) 128/56   Pulse 86   Temp (!) 97.1 F (36.2 C)   Resp 18   Ht '5\' 3"'$  (1.6 m)   Wt 222 lb 4 oz (100.8 kg)   SpO2 98%   BMI 39.37 kg/m    Physical Exam Constitutional:      Appearance: Normal appearance. She is obese.  Cardiovascular:     Rate and Rhythm: Normal rate and regular rhythm.     Heart sounds: Normal heart sounds.  Pulmonary:     Effort: Pulmonary effort is normal.     Breath sounds: Normal breath sounds.  Musculoskeletal:     Right lower leg: Edema present.     Left lower leg: Edema present.  Skin:    Findings: Bruising and erythema present.          Comments: Bruising noted.  Patient states that she did hit her leg and is currently on anticoagulation.  Skin intact  Neurological:     Mental Status: She is alert.       No results found for any visits on 05/01/22.    The ASCVD Risk score (Arnett DK, et al., 2019) failed to calculate for the following reasons:   The 2019 ASCVD risk score is only valid for ages 58 to 68    Assessment & Plan:   Problem List Items Addressed This Visit       Other   Lower extremity edema - Primary    Patient has had a DVT rule out.  We will rule out cellulitis with the use of antibiotics without improvement.  We will do an urgent referral to vascular surgery could be venous insufficiency or venous stasis dermatitis.  Did give recommendations of patient continue getting up walking around her home when she is resting to elevate legs.  Ambulatory referral to vascular made today      Relevant Orders   Ambulatory referral to Vascular Surgery   Itching    Patient describes itching of the legs which is is nothing new has been using lotion but breaking through with itching.  She also has itching on her back no rash present on patient's back we will start Zyrtec 10 mg nightly.  Did give sedation precautions and dizziness precautions  if she gets up in the middle the night.      Relevant Medications   cetirizine (ZYRTEC) 10 MG tablet    Return for As scheduled for physical.    Romilda Garret, NP

## 2022-05-01 NOTE — Assessment & Plan Note (Signed)
Patient has had a DVT rule out.  We will rule out cellulitis with the use of antibiotics without improvement.  We will do an urgent referral to vascular surgery could be venous insufficiency or venous stasis dermatitis.  Did give recommendations of patient continue getting up walking around her home when she is resting to elevate legs.  Ambulatory referral to vascular made today

## 2022-05-01 NOTE — Assessment & Plan Note (Signed)
Patient describes itching of the legs which is is nothing new has been using lotion but breaking through with itching.  She also has itching on her back no rash present on patient's back we will start Zyrtec 10 mg nightly.  Did give sedation precautions and dizziness precautions if she gets up in the middle the night.

## 2022-05-01 NOTE — Patient Instructions (Signed)
Nice to see you today I sent in the prescription for Zyrtec (cetrizine) if not covered you can get over the counter store brand. This can cause dizziness so be careful if you get up at night to use the bathroom  I placed a referral for the vascular doctor This could be venous stasis dermatitis or venous insufficiency

## 2022-05-04 NOTE — Telephone Encounter (Signed)
Patient advised.

## 2022-05-04 NOTE — Telephone Encounter (Signed)
It is fine to continue taking the lasix to see if this can aid in some of the swelling

## 2022-05-04 NOTE — Telephone Encounter (Signed)
Patient called and wants to know if Catalina Antigua wants her to continue taking Lasix that was prescribed by Dr Zachery Dauer. Please call back at [phone]

## 2022-05-23 NOTE — Assessment & Plan Note (Signed)
Chronic inadequate control  Increase Vesicare to 10 mg daily at bedtime.

## 2022-05-23 NOTE — Assessment & Plan Note (Signed)
Acute on chronic lower extremity edema, right worse than left No clear sign of cellulitis. Will evaluate with ultrasound for DVT.  Evaluate with labs for secondary causes. We will set up [place] of leg today ASAP.  Elevate leg above heart as able.  If shortness of breath or chest pain.[name] go to ER.

## 2022-05-23 NOTE — Assessment & Plan Note (Signed)
Chronic intermittent Most likely secondary to urinary incontinence and pads. No vaginal discharge, no clear indication of yeast or bacterial infection. Possibly related to estrogen deficiency and postmenopausal state.

## 2022-06-15 ENCOUNTER — Other Ambulatory Visit: Payer: Self-pay | Admitting: Family Medicine

## 2022-06-29 ENCOUNTER — Other Ambulatory Visit: Payer: Medicare Other

## 2022-07-02 ENCOUNTER — Encounter: Payer: Medicare Other | Admitting: Family Medicine

## 2022-07-02 ENCOUNTER — Ambulatory Visit: Payer: Medicare Other | Admitting: Nurse Practitioner

## 2022-07-02 ENCOUNTER — Encounter: Payer: Self-pay | Admitting: Nurse Practitioner

## 2022-07-02 VITALS — BP 118/72 | HR 73 | Temp 96.0°F | Resp 16 | Ht 63.5 in | Wt 214.0 lb

## 2022-07-02 DIAGNOSIS — N39498 Other specified urinary incontinence: Secondary | ICD-10-CM | POA: Diagnosis not present

## 2022-07-02 DIAGNOSIS — R7303 Prediabetes: Secondary | ICD-10-CM

## 2022-07-02 DIAGNOSIS — I10 Essential (primary) hypertension: Secondary | ICD-10-CM

## 2022-07-02 DIAGNOSIS — H8110 Benign paroxysmal vertigo, unspecified ear: Secondary | ICD-10-CM

## 2022-07-02 DIAGNOSIS — M069 Rheumatoid arthritis, unspecified: Secondary | ICD-10-CM

## 2022-07-02 DIAGNOSIS — J849 Interstitial pulmonary disease, unspecified: Secondary | ICD-10-CM

## 2022-07-02 DIAGNOSIS — N3001 Acute cystitis with hematuria: Secondary | ICD-10-CM

## 2022-07-02 DIAGNOSIS — R600 Localized edema: Secondary | ICD-10-CM

## 2022-07-02 DIAGNOSIS — E785 Hyperlipidemia, unspecified: Secondary | ICD-10-CM

## 2022-07-02 DIAGNOSIS — N3000 Acute cystitis without hematuria: Secondary | ICD-10-CM

## 2022-07-02 DIAGNOSIS — N3281 Overactive bladder: Secondary | ICD-10-CM

## 2022-07-02 DIAGNOSIS — Z0000 Encounter for general adult medical examination without abnormal findings: Secondary | ICD-10-CM | POA: Diagnosis not present

## 2022-07-02 LAB — POCT URINALYSIS DIPSTICK
Bilirubin, UA: NEGATIVE
Glucose, UA: NEGATIVE
Ketones, UA: NEGATIVE
Nitrite, UA: POSITIVE
Protein, UA: POSITIVE — AB
Spec Grav, UA: 1.025 (ref 1.010–1.025)
Urobilinogen, UA: 0.2 E.U./dL
pH, UA: 5.5 (ref 5.0–8.0)

## 2022-07-02 MED ORDER — MECLIZINE HCL 12.5 MG PO TABS: 12.5000 mg | ORAL_TABLET | Freq: Three times a day (TID) | ORAL | 0 refills | Status: DC | PRN

## 2022-07-02 MED ORDER — CEPHALEXIN 500 MG PO CAPS: 500.0000 mg | ORAL_CAPSULE | Freq: Two times a day (BID) | ORAL | 0 refills | Status: DC

## 2022-07-02 NOTE — Assessment & Plan Note (Signed)
Patient was referred to Dr. Aleda Grana vascular.  He diagnosed her with lymphedema she is wearing compression stockings and has a pump at home.

## 2022-07-02 NOTE — Assessment & Plan Note (Signed)
Patient currently on Vesicare.  States she will wear a pad daily

## 2022-07-02 NOTE — Assessment & Plan Note (Signed)
Pending A1c 

## 2022-07-02 NOTE — Progress Notes (Signed)
Established Patient Office Visit  Subjective   Patient ID: [name], female    DOB: 18-Jul-1941  Age: 81 y.o. MRN: [id number]  Chief Complaint  Patient presents with   Transfer of Care   Annual Exam    AWV was done on 02/25/22   Urinary Incontinence    Some urinary leakage, and some discomfort in the vaginal area at times.    HPI  HTN: Currently maintained on losartan and lasix. Has a machine but does not check it at home  ILD: Dr [name] every 6 months  RA: Dr. Marijean [name]. Every 6 months plaquinil.   Breast CA: Dr [name] once a year, he manages mammograms.   Dizziness: States that she lays down   for complete physical and follow up of chronic conditions.  Immunizations: -Tetanus:Needs updating -Influenza: Patient would like it but would like to wait -Shingles: 05/19/2022 needs second vaccine encouraged to get at pharmacy. -Pneumonia:  utd  -HPV: aged out  Diet: [place]. Skips breakfast sometimes. Small and normal dinner.Cooked dinners.Hot tea, water will have ice tea at dinner Exercise: No regular exercise. Walks to day  [name] exam: Completes annually.  Dr. Jodene [name] [facility] in [place] Dental exam: Completes semi-annually   Pap Smear: Aged out Mammogram: Due 08/2022. Order in the system   Colonoscopy: Completed cologuard Lung Cancer Screening: NA Dexa: 2018, patient would not like to continue.  Sleep: 11pm and get up around 7am. Does not feel rested. Gets up several times at night to the bathroom.  Urinary complaints: states that she has been having incontinece, urgency, no hematuria, dysuria, nausea, vomiting, new acute back pain.  Advanced Care: HPOA [name] (her daughter).      Review of Systems  Constitutional:  Negative for chills and fever.  Respiratory:  Negative for shortness of breath.   Cardiovascular:  Positive for leg swelling. Negative for chest pain.  Genitourinary:  Positive for urgency. Negative for  frequency and hematuria.  Musculoskeletal:  Positive for back pain.  Neurological:  Positive for dizziness.  Psychiatric/Behavioral:  Negative for hallucinations and suicidal ideas.       Objective:     BP 118/72   Pulse 73   Temp (!) 96 F (35.6 C) (Temporal)   Resp 16   Ht 5' 3.5" (1.613 m)   Wt 214 lb (97.1 kg)   SpO2 97%   BMI 37.31 kg/m    Physical Exam Vitals and nursing note reviewed.  Constitutional:      Appearance: Normal appearance.  HENT:     Right Ear: Tympanic membrane, ear canal and external ear normal.     Left Ear: Ear canal and external ear normal.  Cardiovascular:     Rate and Rhythm: Normal rate and regular rhythm.     Heart sounds: Normal heart sounds.  Pulmonary:     Effort: Pulmonary effort is normal.     Breath sounds: Normal breath sounds.  Abdominal:     General: Bowel sounds are normal. There is no distension.     Palpations: There is no mass.     Tenderness: There is no abdominal tenderness. There is no right CVA tenderness or left CVA tenderness.     Hernia: No hernia is present.  Musculoskeletal:     Right lower leg: No edema.     Left lower leg: No edema.  Skin:    General: Skin is warm.  Neurological:     General: No focal deficit present.  Mental Status: She is alert.     Comments: Bilateral upper and lower extremity strength 5/5      Results for orders placed or performed in visit on 07/02/22  POCT urinalysis dipstick  Result Value Ref Range   Color, UA yellow    Clarity, UA hazy    Glucose, UA Negative Negative   Bilirubin, UA negative    Ketones, UA negative    Spec Grav, UA 1.025 1.010 - 1.025   Blood, UA 1+    pH, UA 5.5 5.0 - 8.0   Protein, UA Positive (A) Negative   Urobilinogen, UA 0.2 0.2 or 1.0 E.U./dL   Nitrite, UA positive    Leukocytes, UA Moderate (2+) (A) Negative   Appearance     Odor        The ASCVD Risk score (Arnett DK, et al., 2019) failed to calculate for the following reasons:   The  2019 ASCVD risk score is only valid for ages 33 to 43    Assessment & Plan:   Problem List Items Addressed This Visit       Cardiovascular and Mediastinum   Essential hypertension, benign    Patient currently maintained on losartan and furosemide.  Continuing medication as prescribed        Respiratory   ILD (interstitial lung disease) ([place])    Patient is currently being managed by Dr. Chase [name].  Continue following up with him as recommended        Nervous and Auditory   Benign paroxysmal positional vertigo    Signs and symptoms consistent with BPPV.  Will write low-dose meclizine she can take twice daily as needed.      Relevant Medications   meclizine (ANTIVERT) 12.5 MG tablet     Musculoskeletal and Integument   Rheumatoid arthritis flare [facility])    Patient currently followed by Dr. Amil [name] rheumatology.  Patient currently on hydroxychloroquine twice daily.  Follow-up with rheumatology as recommended        Genitourinary   OAB (overactive bladder)    Patient currently on Vesicare.  States she will wear a pad daily      Acute cystitis with hematuria    UA indicative of urinary tract infection will treat with Keflex 500 mg twice daily.  Pending urine culture      Relevant Medications   cephALEXin (KEFLEX) 500 MG capsule     Other   Hyperlipidemia    Pending lipids today.  Patient has tried statins but intolerant to the class      Relevant Orders   Lipid panel   Prediabetes    Pending A1c.      Relevant Orders   Hemoglobin A1c   TSH   Lipid panel   Preventative health care - Primary    Discussed age-appropriate immunization screening exams.  Also discussed advanced directives.  Patient states healthcare power of attorney is her daughter [name].      Relevant Orders   CBC   Comprehensive metabolic panel   Hemoglobin A1c   TSH   Lipid panel   Lower extremity edema    Patient was referred to Dr. Aleda [name] vascular.  He diagnosed her with  lymphedema she is wearing compression stockings and has a pump at home.      Other Visit Diagnoses     Other urinary incontinence       Relevant Orders   POCT urinalysis dipstick (Completed)   Urine Culture       Return in about  6 months (around 12/31/2022) for Chronic condition recheck .    Romilda Garret, NP

## 2022-07-02 NOTE — Assessment & Plan Note (Signed)
Patient currently maintained on losartan and furosemide.  Continuing medication as prescribed

## 2022-07-02 NOTE — Assessment & Plan Note (Signed)
Patient is currently being managed by Dr. Chase Caller.  Continue following up with him as recommended

## 2022-07-02 NOTE — Patient Instructions (Signed)
Nice to see you today Sent in the antibiotic and the medication to take if you get dizzy. Follow up with me in 6 months, sooner if you need me  Call and get your flu shot next month  Get a tetanus at your local pharmacy

## 2022-07-02 NOTE — Assessment & Plan Note (Signed)
Discussed age-appropriate immunization screening exams.  Also discussed advanced directives.  Patient states healthcare power of attorney is her daughter [name].

## 2022-07-02 NOTE — Assessment & Plan Note (Signed)
Pending lipids today.  Patient has tried statins but intolerant to the class

## 2022-07-02 NOTE — Assessment & Plan Note (Signed)
Patient currently followed by Dr. Amil Amen rheumatology.  Patient currently on hydroxychloroquine twice daily.  Follow-up with rheumatology as recommended

## 2022-07-02 NOTE — Assessment & Plan Note (Signed)
Signs and symptoms consistent with BPPV.  Will write low-dose meclizine she can take twice daily as needed.

## 2022-07-02 NOTE — Assessment & Plan Note (Signed)
UA indicative of urinary tract infection will treat with Keflex 500 mg twice daily.  Pending urine culture

## 2022-07-05 LAB — URINE CULTURE
MICRO NUMBER:: 13981488
SPECIMEN QUALITY:: ADEQUATE

## 2022-07-06 ENCOUNTER — Encounter: Payer: Self-pay | Admitting: Nurse Practitioner

## 2022-07-15 ENCOUNTER — Other Ambulatory Visit (INDEPENDENT_AMBULATORY_CARE_PROVIDER_SITE_OTHER): Payer: Medicare Other

## 2022-07-15 DIAGNOSIS — R7303 Prediabetes: Secondary | ICD-10-CM

## 2022-07-15 DIAGNOSIS — E785 Hyperlipidemia, unspecified: Secondary | ICD-10-CM | POA: Diagnosis not present

## 2022-07-15 DIAGNOSIS — Z0000 Encounter for general adult medical examination without abnormal findings: Secondary | ICD-10-CM

## 2022-07-15 LAB — COMPREHENSIVE METABOLIC PANEL
ALT: 14 U/L (ref 0–35)
AST: 17 U/L (ref 0–37)
Albumin: 3.8 g/dL (ref 3.5–5.2)
Alkaline Phosphatase: 82 U/L (ref 39–117)
BUN: 12 mg/dL (ref 6–23)
CO2: 28 mEq/L (ref 19–32)
Calcium: 8.9 mg/dL (ref 8.4–10.5)
Chloride: 105 mEq/L (ref 96–112)
Creatinine, Ser: 0.74 mg/dL (ref 0.40–1.20)
GFR: 74.89 mL/min (ref >60.00)
Glucose, Bld: 89 mg/dL (ref 70–99)
Potassium: 4.2 mEq/L (ref 3.5–5.1)
Sodium: 140 mEq/L (ref 135–145)
Total Bilirubin: 0.5 mg/dL (ref 0.2–1.2)
Total Protein: 6.7 g/dL (ref 6.0–8.3)

## 2022-07-15 LAB — CBC
HCT: 37.8 % (ref 36.0–46.0)
Hemoglobin: 12.6 g/dL (ref 12.0–15.0)
MCHC: 33.4 g/dL (ref 30.0–36.0)
MCV: 91.6 fl (ref 78.0–100.0)
Platelets: 212 10*3/uL (ref 150.0–400.0)
RBC: 4.13 Mil/uL (ref 3.87–5.11)
RDW: 14 % (ref 11.5–15.5)
WBC: 6.2 10*3/uL (ref 4.0–10.5)

## 2022-07-15 LAB — LIPID PANEL
Cholesterol: 168 mg/dL (ref 0–200)
HDL: 49.5 mg/dL (ref >39.00)
LDL Cholesterol: 104 mg/dL — ABNORMAL HIGH (ref 0–99)
NonHDL: 118.79
Total CHOL/HDL Ratio: 3
Triglycerides: 73 mg/dL (ref 0.0–149.0)
VLDL: 14.6 mg/dL (ref 0.0–40.0)

## 2022-07-15 LAB — TSH: TSH: 2.25 u[IU]/mL (ref 0.35–5.50)

## 2022-07-15 LAB — HEMOGLOBIN A1C: Hgb A1c MFr Bld: 5.9 % (ref 4.6–6.5)

## 2022-07-21 ENCOUNTER — Ambulatory Visit: Payer: Medicare Other | Admitting: Nurse Practitioner

## 2022-07-21 VITALS — BP 142/92 | HR 79 | Temp 95.5°F | Resp 20 | Ht 63.5 in | Wt 214.4 lb

## 2022-07-21 DIAGNOSIS — N3281 Overactive bladder: Secondary | ICD-10-CM | POA: Diagnosis not present

## 2022-07-21 DIAGNOSIS — N39498 Other specified urinary incontinence: Secondary | ICD-10-CM | POA: Diagnosis not present

## 2022-07-21 DIAGNOSIS — H6122 Impacted cerumen, left ear: Secondary | ICD-10-CM

## 2022-07-21 DIAGNOSIS — M79604 Pain in right leg: Secondary | ICD-10-CM | POA: Diagnosis not present

## 2022-07-21 DIAGNOSIS — R32 Unspecified urinary incontinence: Secondary | ICD-10-CM | POA: Insufficient documentation

## 2022-07-21 DIAGNOSIS — M79605 Pain in left leg: Secondary | ICD-10-CM

## 2022-07-21 DIAGNOSIS — I10 Essential (primary) hypertension: Secondary | ICD-10-CM

## 2022-07-21 LAB — POC URINALSYSI DIPSTICK (AUTOMATED)
Bilirubin, UA: NEGATIVE
Glucose, UA: NEGATIVE
Ketones, UA: NEGATIVE
Leukocytes, UA: NEGATIVE
Nitrite, UA: NEGATIVE
Protein, UA: NEGATIVE
Spec Grav, UA: 1.015 (ref 1.010–1.025)
Urobilinogen, UA: 0.2 E.U./dL
pH, UA: 5.5 (ref 5.0–8.0)

## 2022-07-21 NOTE — Progress Notes (Signed)
Established Patient Office Visit  Subjective   Patient ID: [name], female    DOB: Mar 04, 1941  Age: 81 y.o. MRN: [id number]  Chief Complaint  Patient presents with   urine incontinence    Urgency, incontinency. Started about 2 weeks ago. Her symptoms resolved in September after she was treated for UTI then    HPI  Urinary sympotms: States that sympotms started approx 2 weeks ago. States that she feels that she is going more frequent and if she tryies to hold it she will leak. No dysuria. Feels like she is empyting out all the way   Bilateral leg pain: Patient was recently seen by her rheumatologist.  She is having bilateral proximal anterior thigh pain.  Only hurts when she gets from a seated position to standing.  She recently was on steroids and was tapered off her rheumatologist.  She did take couple doses as needed that seem to help with pain.  Patient is not currently on a statin medication.  She is followed by vascular for lymphedema.   Shoulder issues: At the end of the visit when patient had had cerumen disimpaction nurse stated patient had 1 more question.  She had complaint of decreased range of motion of bilateral shoulders left greater than right.  Patient was seen by orthopedist approximately 3 months ago and injection given.  Encourage patient to follow back up with orthopedist as she stated he mentioned that she would need shoulder replacements.   Review of Systems  Constitutional:  Negative for chills and fever.  Gastrointestinal:  Negative for abdominal pain, nausea and vomiting.  Musculoskeletal:  Positive for joint pain and myalgias. Negative for back pain.      Objective:     BP (!) 142/92   Pulse 79   Temp (!) 95.5 F (35.3 C) (Temporal)   Resp 20   Ht 5' 3.5" (1.613 m)   Wt 214 lb 6 oz (97.2 kg)   SpO2 96%   BMI 37.38 kg/m  BP Readings from Last 3 Encounters:  07/21/22 (!) 142/92  07/02/22 118/72  05/01/22 (!) 128/56   Wt Readings  from Last 3 Encounters:  07/21/22 214 lb 6 oz (97.2 kg)  07/02/22 214 lb (97.1 kg)  05/01/22 222 lb 4 oz (100.8 kg)      Physical Exam Vitals and nursing note reviewed.  Constitutional:      Appearance: Normal appearance.  HENT:     Right Ear: Tympanic membrane, ear canal and external ear normal.     Left Ear: Ear canal and external ear normal. There is impacted cerumen.  Cardiovascular:     Rate and Rhythm: Normal rate and regular rhythm.     Pulses: Normal pulses.     Heart sounds: Normal heart sounds.  Pulmonary:     Effort: Pulmonary effort is normal.     Breath sounds: Normal breath sounds.  Abdominal:     General: Bowel sounds are normal. There is no distension.     Palpations: There is no mass.     Tenderness: There is no abdominal tenderness. There is no right CVA tenderness or left CVA tenderness.     Hernia: No hernia is present.  Neurological:     General: No focal deficit present.     Mental Status: She is alert.      Results for orders placed or performed in visit on 07/21/22  POCT Urinalysis Dipstick (Automated)  Result Value Ref Range   Color, UA yellow  Clarity, UA clear    Glucose, UA Negative Negative   Bilirubin, UA negative    Ketones, UA negative    Spec Grav, UA 1.015 1.010 - 1.025   Blood, UA trace    pH, UA 5.5 5.0 - 8.0   Protein, UA Negative Negative   Urobilinogen, UA 0.2 0.2 or 1.0 E.U./dL   Nitrite, UA negative    Leukocytes, UA Negative Negative      The ASCVD Risk score (Arnett DK, et al., 2019) failed to calculate for the following reasons:   The 2019 ASCVD risk score is only valid for ages 69 to 60    Assessment & Plan:   Problem List Items Addressed This Visit       Cardiovascular and Mediastinum   Essential hypertension, benign    Blood pressure slightly elevated in office.  Last visit she was normotensive blood pressure did trend down on recheck.  Patient was recently seen at Dr. Melissa Noon office her rheumatologist  and blood pressure was within normal limits.  We will continue current medication no changes made At this juncture.        Nervous and Auditory   Impacted cerumen of left ear    Verbal consent obtained.  Patient was prepped per office policy.  A mixture of water and hydroperoxide was used in the ear was irrigated.  Patient tolerated procedure well.  Disimpaction was removed no acute complications.        Genitourinary   OAB (overactive bladder)    Patient currently on Vesicare recent dose increase in July 2023.  Patient is having other urinary symptoms she thinks is different to her OAB.  UA showed microscopic hematuria.  Pending urine culture.  Continue medication as prescribed currently        Other   Bilateral leg pain    Been going on approximately 2 weeks.  Better yesterday and today.  Not on a statin medication.  Patient is followed by vascular.  Recently took some as needed prednisone that helped rheumatology.      Absence of bladder continence - Primary    Over the past 2 weeks.  Not having classic signs of cystitis.  UA remarkably normal minus some blood.  Pending urine culture.      Relevant Orders   POCT Urinalysis Dipstick (Automated) (Completed)   Urine Culture    Return in about 5 months (around 12/20/2022) for For recheck of chronic condiditons .    Romilda Garret, NP

## 2022-07-21 NOTE — Assessment & Plan Note (Signed)
Been going on approximately 2 weeks.  Better yesterday and today.  Not on a statin medication.  Patient is followed by vascular.  Recently took some as needed prednisone that helped rheumatology.

## 2022-07-21 NOTE — Assessment & Plan Note (Signed)
Over the past 2 weeks.  Not having classic signs of cystitis.  UA remarkably normal minus some blood.  Pending urine culture.

## 2022-07-21 NOTE — Patient Instructions (Signed)
Nice to see you today I will be in touch with the urine culture once I have it Follow up if no improvement

## 2022-07-21 NOTE — Assessment & Plan Note (Signed)
Blood pressure slightly elevated in office.  Last visit she was normotensive blood pressure did trend down on recheck.  Patient was recently seen at Dr. Melissa Noon office her rheumatologist and blood pressure was within normal limits.  We will continue current medication no changes made At this juncture.

## 2022-07-21 NOTE — Assessment & Plan Note (Signed)
Patient currently on Vesicare recent dose increase in July 2023.  Patient is having other urinary symptoms she thinks is different to her OAB.  UA showed microscopic hematuria.  Pending urine culture.  Continue medication as prescribed currently

## 2022-07-21 NOTE — Assessment & Plan Note (Signed)
Verbal consent obtained.  Patient was prepped per office policy.  A mixture of water and hydroperoxide was used in the ear was irrigated.  Patient tolerated procedure well.  Disimpaction was removed no acute complications.

## 2022-07-23 LAB — URINE CULTURE
MICRO NUMBER:: 14061844
SPECIMEN QUALITY:: ADEQUATE

## 2022-08-06 ENCOUNTER — Ambulatory Visit
Admission: RE | Admit: 2022-08-06 | Discharge: 2022-08-06 | Disposition: A | Discharge status: Home / Self Care | Payer: Medicare Other | Source: Ambulatory Visit | Attending: Hematology and Oncology | Admitting: Hematology and Oncology

## 2022-08-06 DIAGNOSIS — Z1231 Encounter for screening mammogram for malignant neoplasm of breast: Secondary | ICD-10-CM

## 2022-08-16 NOTE — Progress Notes (Signed)
Patient Care Team: Michela Pitcher, NP as PCP - General (Nurse Practitioner) Josue Hector, MD as PCP - Cardiology (Cardiology) Rosita Kea, PA-C (Inactive) as Physician Assistant (Rheumatology) Christain [place], [place] as Referring Physician (Optometry) Danella Sensing, MD as Consulting Physician (Dermatology) Mauro Kaufmann, RN as Oncology Nurse Navigator Rockwell [place], RN as Oncology Nurse Navigator  DIAGNOSIS:  Encounter Diagnosis  Name Primary?   Ductal carcinoma in situ (DCIS) of right breast Yes    SUMMARY OF ONCOLOGIC HISTORY: Oncology History  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/27/2019 Initial Diagnosis   Patient reported two weeks of spontaneous right nipple discharge. 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 Biopsy on 07/05/19 showed fibrocystic changes in the left breast, no evidence of malignancy. Breast MRI on 08/11/19 showed a 1.7cm right nipple base mass, a 0.9cm superior central right breast mass, and indeterminate non-mass enhancement in the central left breast. Biopsy on 08/24/19 showed no evidence of malignancy in the left breast, and in the right breast, DCIS with necrosis, high grade, ER+ 100%, PR+ 70%.    09/20/2019 Surgery   Bilateral lumpectomies ([facility]): Left breast: complex sclerosing lesion with no evidence of malignancy Right breast: nipple adenoma with intraductal papilloma and DCIS, intermediate grade, 0.4cm, clear margins.   11/28/2019 -  Radiation Therapy   Adjuvant radiation     CHIEF COMPLIANT:  Follow-up of right breast DCIS, bilateral PE    INTERVAL HISTORY: [name] is a 81 y.o. with above-mentioned history of right breast DCIS treated with lumpectomy, radiation, and who declined antiestrogen therapy due to a history of superficial venous thrombosis. She also has a history of bilateral  pulmonary emboli, for which she is currently on anticoagulation with Xarelto. Mammogram on 08/05/2021 showed no evidence of malignancy. She presents to the clinic today for follow-up. She states that she does have some aches and joint stiffness. She also report lymphedema of the legs. She denies any pain or discomfort in the breast. She keeps a check on her constipation.   ALLERGIES:  is allergic to codeine and hydrocodone bit-homatrop mbr.  MEDICATIONS:  Current Outpatient Medications  Medication Sig Dispense Refill   acetaminophen (TYLENOL) 500 MG tablet Take 1,000 mg by mouth in the morning and at bedtime.     cetirizine (ZYRTEC) 10 MG tablet Take 1 tablet (10 mg total) by mouth daily. 30 tablet 11   estradiol (ESTRACE) 0.1 MG/GM vaginal cream 0.5 g of cream intravaginally administered daily for 2 weeks, then reduce to twice weekly 02.7 g 12   folic acid (FOLVITE) 1 MG tablet Take 2 mg by mouth daily.     hydroxychloroquine (PLAQUENIL) 200 MG tablet Take 200 mg by mouth 2 (two) times daily.     losartan (COZAAR) 25 MG tablet Take 1 tablet by mouth once daily 90 tablet 0   meclizine (ANTIVERT) 12.5 MG tablet Take 1 tablet (12.5 mg total) by mouth 3 (three) times daily as needed for dizziness. 14 tablet 0   solifenacin (VESICARE) 10 MG tablet Take 1 tablet by mouth once daily 90 tablet 0   Vitamin D, Cholecalciferol, 25 MCG (1000 UT) TABS Take 1 tablet by mouth 2 (two) times daily.     XARELTO 20 MG TABS tablet TAKE 1 TABLET BY MOUTH  DAILY WITH SUPPER 90 tablet 3   No current facility-administered  medications for this visit.    PHYSICAL EXAMINATION: ECOG PERFORMANCE STATUS: 1 - Symptomatic but completely ambulatory  Vitals:   08/21/22 1116  BP: 128/64  Pulse: 78  Resp: 18  Temp: (!) 97.5 F (36.4 C)  SpO2: 98%   Filed Weights   08/21/22 1116  Weight: 210 lb 3.2 oz (95.3 kg)    BREAST: No palpable masses or nodules in either right or left breasts. No palpable axillary  supraclavicular or infraclavicular adenopathy no breast tenderness or nipple discharge.  Nipple inversion left breast (exam performed in the presence of a chaperone)  LABORATORY DATA:  I have reviewed the data as listed    Latest Ref Rng & Units 07/15/2022    9:08 AM 04/26/2022   12:18 PM 04/10/2022    1:03 PM  CMP  Glucose 70 - 99 mg/dL 89  119  82   BUN 6 - 23 mg/dL '12  12  15   '$ Creatinine 0.40 - 1.20 mg/dL 0.74  0.76  0.80   Sodium 135 - 145 mEq/L 140  138  143   Potassium 3.5 - 5.1 mEq/L 4.2  3.8  4.0   Chloride 96 - 112 mEq/L 105  108  103   CO2 19 - 32 mEq/L 28  24  33   Calcium 8.4 - 10.5 mg/dL 8.9  8.8  9.4   Total Protein 6.0 - 8.3 g/dL 6.7   6.8   Total Bilirubin 0.2 - 1.2 mg/dL 0.5   0.5   Alkaline Phos 39 - 117 U/L 82   74   AST 0 - 37 U/L 17   29   ALT 0 - 35 U/L 14   20     Lab Results  Component Value Date   WBC 6.2 07/15/2022   HGB 12.6 07/15/2022   HCT 37.8 07/15/2022   MCV 91.6 07/15/2022   PLT 212.0 07/15/2022   NEUTROABS 4.3 04/26/2022    ASSESSMENT & PLAN:  Ductal carcinoma in situ (DCIS) of right breast 09/10/2019:Bilateral lumpectomies ([facility]): Left breast: complex sclerosing lesion with no evidence of malignancy Right breast: nipple adenoma with intraductal papilloma and DCIS, intermediate grade, 0.4cm, clear margins.  ER 100%, PR 70% Tis NX stage 0   Treatment plan: Adjuvant radiation 11/28/2019-12/19/2019 Patient decided not to take antiestrogen therapy because of her history of superficial venous thrombosis. 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  Breast cancer surveillance: Breast exam 08/21/2022:  Benign Mammogram 08/07/2022: Benign breast density category B  Return to clinic in 1 year for follow-up    No orders of the defined types were placed in this encounter.  The patient has a good understanding of the overall plan. she agrees with it. she will call with any problems that may develop before the next visit here. Total time spent: 30 mins including face to face time and time spent for planning, charting and co-ordination of care   Harriette Ohara, MD 08/21/22    I Gardiner Coins am scribing for Dr. Lindi Adie  I have reviewed the above documentation for accuracy and completeness, and I agree with the above.

## 2022-08-17 ENCOUNTER — Other Ambulatory Visit: Payer: Self-pay | Admitting: Family Medicine

## 2022-08-19 ENCOUNTER — Telehealth: Payer: Self-pay | Admitting: Nurse Practitioner

## 2022-08-19 NOTE — Telephone Encounter (Signed)
Patient last seen in October 2023 and b/p was elevated some. Still elevated when she checked yesterday. Any changed to be made to medication?

## 2022-08-19 NOTE — Telephone Encounter (Signed)
Patient called and stated Matt wanted to give the readings for yesterday blood pressure of 147/70 and she does have a lot of hydorchlorothiazide and wanted to know should she go back on those pills. Call back number [phone].

## 2022-08-20 NOTE — Telephone Encounter (Signed)
Left message to call back please get more details as listed below

## 2022-08-20 NOTE — Telephone Encounter (Signed)
Has she checked it more than one time. Was this the only reading that she  considered high? I have that she is on losartan '25mg'$ . Make sure she is still taking that. She was prescribed lasix or furosemide at one point that is why the hctcz was stopped.I see that the furosemide was discontinued. Last office visit with me her bp has been WNL. Most recent office visit it was slightly elevated

## 2022-08-21 ENCOUNTER — Inpatient Hospital Stay: Payer: Medicare Other | Attending: Hematology and Oncology | Admitting: Hematology and Oncology

## 2022-08-21 VITALS — BP 128/64 | HR 78 | Temp 97.5°F | Resp 18 | Ht 63.5 in | Wt 210.2 lb

## 2022-08-21 DIAGNOSIS — Z79899 Other long term (current) drug therapy: Secondary | ICD-10-CM | POA: Diagnosis not present

## 2022-08-21 DIAGNOSIS — Z923 Personal history of irradiation: Secondary | ICD-10-CM | POA: Insufficient documentation

## 2022-08-21 DIAGNOSIS — Z86718 Personal history of other venous thrombosis and embolism: Secondary | ICD-10-CM | POA: Diagnosis not present

## 2022-08-21 DIAGNOSIS — Z86711 Personal history of pulmonary embolism: Secondary | ICD-10-CM | POA: Insufficient documentation

## 2022-08-21 DIAGNOSIS — D0511 Intraductal carcinoma in situ of right breast: Secondary | ICD-10-CM | POA: Diagnosis not present

## 2022-08-21 DIAGNOSIS — M069 Rheumatoid arthritis, unspecified: Secondary | ICD-10-CM | POA: Diagnosis not present

## 2022-08-21 DIAGNOSIS — Z7901 Long term (current) use of anticoagulants: Secondary | ICD-10-CM | POA: Insufficient documentation

## 2022-08-21 DIAGNOSIS — I890 Lymphedema, not elsewhere classified: Secondary | ICD-10-CM | POA: Diagnosis not present

## 2022-08-21 NOTE — Assessment & Plan Note (Signed)
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  ER 100%, PR 70% Tis NX stage 0   Treatment plan: Adjuvant radiation 11/28/2019-12/19/2019 Patient decided not to take antiestrogen therapy because of her history of superficial venous thrombosis. 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  She will discuss with her primary care physician about weight loss medication.  Breast cancer surveillance: Breast exam 08/21/2022: Benign Mammogram 08/07/2022: Benign breast density category B  Return to clinic in 1 year for follow-up

## 2022-08-21 NOTE — Telephone Encounter (Signed)
Patient states she has been taking it at home off and on and top usually 140s-150s/70s or so, nothing above 90s. Patient is not sure if its her b/p machine or not. She will go to cancer doctor today for an appointment and will see what it is running there. Then I will send message to [facility] to review. Patient states she use to be on HCTZ for many years and had no issues. I advised her the reason of that been stopped last time with taking Lasix-fluid pills, patient is not on fluid pills anymore.

## 2022-08-21 NOTE — Telephone Encounter (Signed)
Looked in the chart b/p normal at oncology office 128/64-please review how to proceed.

## 2022-08-21 NOTE — Telephone Encounter (Signed)
Patient advised.

## 2022-08-21 NOTE — Telephone Encounter (Signed)
Since blood pressure normal at doctors office continue course as is no addition of hctz

## 2022-09-13 ENCOUNTER — Other Ambulatory Visit: Payer: Self-pay | Admitting: Hematology and Oncology

## 2022-09-14 ENCOUNTER — Other Ambulatory Visit: Payer: Self-pay | Admitting: Nurse Practitioner

## 2022-09-18 ENCOUNTER — Telehealth: Payer: Self-pay | Admitting: Nurse Practitioner

## 2022-09-18 NOTE — Telephone Encounter (Signed)
Await receiving this in the mail.

## 2022-09-18 NOTE — Telephone Encounter (Signed)
Patient called and stated she is sending a placard form for her mother to be filled out and sent back.

## 2022-09-18 NOTE — Telephone Encounter (Signed)
Form placed in Matts inbox for review and completion.

## 2022-09-18 NOTE — Telephone Encounter (Signed)
Patient daughter Webb Silversmith sent over an email with a form that needs to be filled out. She stated if possible could it be emailed back over to her at [email or web address].

## 2022-09-21 NOTE — Telephone Encounter (Signed)
Error

## 2022-09-22 NOTE — Telephone Encounter (Signed)
I have completed the form. I will need the address and such placed on it. I will have it in the outbox to be faxed/emailed back to the patients family member

## 2022-09-22 NOTE — Telephone Encounter (Signed)
Patient daughter Webb Silversmith called in asking about the status of her handicapp placard?

## 2022-09-24 NOTE — Telephone Encounter (Signed)
Called and informed pt that forms is filled out and will be up front for pick up.

## 2022-10-26 ENCOUNTER — Telehealth: Payer: Self-pay | Admitting: Nurse Practitioner

## 2022-10-26 NOTE — Telephone Encounter (Signed)
She can stop taking it and see if her over active bladder gets worse. I am ok with her trailing off it

## 2022-10-26 NOTE — Telephone Encounter (Signed)
Patient called and stated the medication solifenacin (VESICARE) 10 MG tablet   is not working and wanted to know if she can just stop it and she also stated that it just got refilled at the pharmacy.  Call back number [phone].

## 2022-10-26 NOTE — Telephone Encounter (Signed)
Spoke with patient and advised. She will let pharmacy know not to refill rx. States she does have an upcoming OV so will discuss any changes in symptoms of OAB at that time. Nothing further needed at this time.

## 2022-10-29 ENCOUNTER — Ambulatory Visit (INDEPENDENT_AMBULATORY_CARE_PROVIDER_SITE_OTHER): Payer: Medicare Other | Admitting: Internal Medicine

## 2022-10-29 ENCOUNTER — Encounter: Payer: Self-pay | Admitting: Primary Care

## 2022-10-29 ENCOUNTER — Ambulatory Visit (INDEPENDENT_AMBULATORY_CARE_PROVIDER_SITE_OTHER): Payer: Medicare Other | Admitting: Primary Care

## 2022-10-29 VITALS — BP 138/62 | HR 83 | Temp 97.7°F | Ht 63.0 in | Wt 213.0 lb

## 2022-10-29 DIAGNOSIS — M359 Systemic involvement of connective tissue, unspecified: Secondary | ICD-10-CM

## 2022-10-29 DIAGNOSIS — J8489 Other specified interstitial pulmonary diseases: Secondary | ICD-10-CM

## 2022-10-29 DIAGNOSIS — M069 Rheumatoid arthritis, unspecified: Secondary | ICD-10-CM

## 2022-10-29 DIAGNOSIS — J849 Interstitial pulmonary disease, unspecified: Secondary | ICD-10-CM | POA: Diagnosis not present

## 2022-10-29 LAB — PULMONARY FUNCTION TEST
DL/VA % pred: 111 %
DL/VA: 4.56 ml/min/mmHg/L
DLCO cor % pred: 93 %
DLCO cor: 16.86 ml/min/mmHg
DLCO unc % pred: 93 %
DLCO unc: 16.86 ml/min/mmHg
FEF 25-75 Pre: 0.97 L/sec
FEF2575-%Pred-Pre: 80 %
FEV1-%Pred-Pre: 81 %
FEV1-Pre: 1.42 L
FEV1FVC-%Pred-Pre: 100 %
FEV6-%Pred-Pre: 86 %
FEV6-Pre: 1.91 L
FEV6FVC-%Pred-Pre: 106 %
FVC-%Pred-Pre: 81 %
FVC-Pre: 1.93 L
Pre FEV1/FVC ratio: 73 %
Pre FEV6/FVC Ratio: 100 %

## 2022-10-29 NOTE — Progress Notes (Signed)
$'@Patient'Q$  ID: [name], female    DOB: October 23, 1940, 82 y.o.   MRN: [id number]  Chief Complaint  Patient presents with   Follow-up    F/u after Pft    Referring provider: Michela Pitcher, NP  HPI: 82 year old female, former smoker quit 1965 (10-pack-year history).  Past medical history significant for interstitial lung disease due to connective tissue disease.  Patient of Dr. Chase [name], last seen on 01/27/2022.   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  Pt states she has been doing okay since last visit and states her breathing is about the same.    [name] 82 y.o. -returns for follow-up of her mild ILD.  Last seen September 2022 symptoms continue to be stable.  Pulmonary function test shows stability and normalcy.  Therefore the ILD is very mild.  The DLCO might be slightly lower but is still within normal limits.  She is more bothered by her joint issues.  She has seen Dr. Amil [name] who slowly planning to get her off prednisone.  She continues on Plaquenil.  She does not want to do Rituxan.  She is on Xarelto which according to her has been managed with Dr. Sonny [name].  According to her it is lifelong requirement   She also wanted to talk a little bit about weight loss.  Her current body weight is 220 pounds with a BMI greater than 36.  She wants to lose significant amount of weight.  She thinks it needs to be done by exercise but she is unable to access.  I did indicate to its nutritional.  We discussed about low carbohydrate diet and weight loss plan.  Estimated caloric limitation Per day is 1500 cal.  She  would need to lose at least 50 pounds over the next 2-4 years.    10/29/2022- Interim hx  Patient presents today 82-monthfollow-up with pulmonary function testing.  She is doing well today. She has no respiratory symptoms. ILD felt to be stable to mild on PFTs.  HRCT in February 2020 showed no progression of ILD compared to June 2019 . Breathing wise she is doing well. No significant shortness of breath, cough or chest tightness.   Following with Dr. BAmil Amenwith rheumatology for RA, currently on Plaquenil. Weaned off prednisone, only using as needed for arthritic pain. She last took a dose 2 weeks ago.   History of PE, on Xarelto which is managed by Dr. GLindi [name]    SYMPTOM SCALE - ILD 11/08/2020   06/23/2021 219# 01/27/2022   10/29/2022   O2 use ra ra ra RA  Shortness of Breath 0 -> 5 scale with 5 being worst (score 6 If unable to do)       At rest 0 0 1 0  Simple tasks - showers, clothes change, eating, shaving '2 3 2 2  '$ Household (dishes, doing bed, laundry) '2 3 3 2  '$ Shopping '2 3 3 2  '$ Walking level at own pace '4 3 3 2  '$ Walking up Stairs '3 4 4 '$ 3  Total (30-36) Dyspnea Score '13 16 16 11  '$ How bad is your cough? 3   0 0  How bad is your fatigue '5   3 4  '$ How bad is nausea 0   0 0  How bad is vomiting?   00   0 0  How bad is diarrhea? 0   0 0  How bad is anxiety? 0   0 0  How bad is depression 0   0 0            Simple office walk 185 feet x  3 laps goal with forehead probe 02/22/2018   04/24/2020   11/08/2020   06/23/2021   10/29/2022   O2 used Room air ra ra ra RA  Number laps completed 3 all laps 3 2 Did all 3 3 laps   Comments about pace Normal pace Nl pace 98% and 90/min Avg pace Average pace   Resting Pulse Ox/HR 98% and 85/min 99% and 76/min 94% and HR 130 100% and 81 99% and 80  Final Pulse Ox/HR 97% and 121/min 99% and 129/minn Slow pace 96% and 138/mn 98% and 127 bpm  Desaturated </= 88% no no   no no  Desaturated <= 3% points no no Yes  4 points Yes, 4 point no  Got  Tachycardic >/= 90/min yes yes   yes yes  Symptoms at end of test Visibly Dyspneic and improved with rest. No chest pain or cough but no subjective dyspnea Denied dyspnea but visibly dyspenic to CMA   Mild dyspnea No symptoms   Miscellaneous comments none x   x x     Allergies  Allergen Reactions   Codeine Nausea And Vomiting   Hydrocodone Bit-Homatrop Mbr Other (See Comments)    Hallucinations     Immunization History  Administered Date(s) Administered   Fluad Quad(high Dose 65+) 06/27/2019, 08/13/2020, 07/17/2021, 09/04/2022   Influenza,inj,Quad PF,6+ Mos 08/05/2017, 08/04/2018   PFIZER(Purple Top)SARS-COV-2 Vaccination 10/20/2019, 11/10/2019, 05/28/2020, 01/15/2021   PNEUMOCOCCAL CONJUGATE-20 05/19/2022   Pfizer Covid-19 Vaccine Bivalent Booster 82yr & up 08/19/2021   Pneumococcal Conjugate-13 07/10/2014   Pneumococcal Polysaccharide-23 01/04/2008   Td 10/05/1996, 01/04/2008   Zoster Recombinat (Shingrix) 05/19/2022   Zoster, Live 02/01/2008    Past Medical History:  Diagnosis Date   Cancer ([facility]    breast   Cataract    diagnosed by Dr. PPhineas Douglas  Dysrhythmia    PSVT/ palpitations-  controlled with prn Inderal   GERD (gastroesophageal reflux disease)    30 years ago.    Hyperlipidemia    statin intolerant   Hypertension    eccho 4/13 EPIC   Migraines    Obesity    Osteoarthritis    Personal history of radiation therapy    right breast   Phlebitis of leg, right, superficial 1986/1989   x 2   Pulmonary fibrosis ([facility])    RA (rheumatoid arthritis) ([facility]     Tobacco History: Social History   Tobacco Use  Smoking Status Former   Packs/day: 0.50   Years: 10.00   Total pack years: 5.00   Types: Cigarettes   Start date: 89  Quit date: 10/06/1963   Years since quitting: 59.1  Smokeless Tobacco Never   Counseling given: Not Answered   Outpatient Medications Prior to Visit  Medication Sig Dispense Refill   acetaminophen (TYLENOL) 500 MG tablet Take 1,000  mg by mouth in the morning and at bedtime.     cetirizine (ZYRTEC) 10  MG tablet Take 1 tablet (10 mg total) by mouth daily. 30 tablet 11   estradiol (ESTRACE) 0.1 MG/GM vaginal cream 0.5 g of cream intravaginally administered daily for 2 weeks, then reduce to twice weekly 59.5 g 12   folic acid (FOLVITE) 1 MG tablet Take 2 mg by mouth daily.     hydroxychloroquine (PLAQUENIL) 200 MG tablet Take 200 mg by mouth 2 (two) times daily.     losartan (COZAAR) 25 MG tablet Take 1 tablet by mouth once daily 90 tablet 1   Vitamin D, Cholecalciferol, 25 MCG (1000 UT) TABS Take 1 tablet by mouth 2 (two) times daily.     XARELTO 20 MG TABS tablet TAKE 1 TABLET BY MOUTH  DAILY WITH SUPPER 90 tablet 3   meclizine (ANTIVERT) 12.5 MG tablet Take 1 tablet (12.5 mg total) by mouth 3 (three) times daily as needed for dizziness. (Patient not taking: Reported on 10/29/2022) 14 tablet 0   solifenacin (VESICARE) 10 MG tablet Take 1 tablet by mouth once daily (Patient not taking: Reported on 10/29/2022) 90 tablet 0   No facility-administered medications prior to visit.    Review of Systems  Review of Systems  Constitutional:  Positive for fatigue.  HENT: Negative.    Respiratory:  Negative for cough, chest tightness and shortness of breath.   Cardiovascular: Negative.    Physical Exam  BP 138/62 (BP Location: Left Arm, Cuff Size: Large)   Pulse 83   Temp 97.7 F (36.5 C) (Temporal)   Ht '5\' 3"'$  (1.6 m)   Wt 213 lb (96.6 kg)   SpO2 96%   BMI 37.73 kg/m  Physical Exam Constitutional:      General: She is not in acute distress.    Appearance: Normal appearance. She is not ill-appearing.  HENT:     Head: Normocephalic and atraumatic.     Right Ear: Tympanic membrane normal. There is impacted cerumen.     Left Ear: Tympanic membrane normal.  Cardiovascular:     Rate and Rhythm: Normal rate and regular rhythm.  Pulmonary:     Effort: Pulmonary effort is normal.     Breath sounds: Normal breath sounds.      Comments: CTA, very faint rales at right base  Neurological:     General: No focal deficit present.     Mental Status: She is alert and oriented to person, place, and time. Mental status is at baseline.  Psychiatric:        Mood and Affect: Mood normal.        Behavior: Behavior normal.        Thought Content: Thought content normal.        Judgment: Judgment normal.      Lab Results:  CBC    Component Value Date/Time   WBC 6.2 07/15/2022 0908   RBC 4.13 07/15/2022 0908   HGB 12.6 07/15/2022 0908   HCT 37.8 07/15/2022 0908   PLT 212.0 07/15/2022 0908   MCV 91.6 07/15/2022 0908   MCH 31.0 04/26/2022 1218   MCHC 33.4 07/15/2022 0908   RDW 14.0 07/15/2022 0908   LYMPHSABS 2.7 04/26/2022 1218   MONOABS 0.6 04/26/2022 1218   EOSABS 0.3 04/26/2022 1218   BASOSABS 0.0 04/26/2022 1218    BMET    Component Value Date/Time   NA 140 07/15/2022 0908   K 4.2 07/15/2022 0908   CL 105 07/15/2022 0908   CO2 28 07/15/2022 0908   GLUCOSE 89 07/15/2022 0908   BUN  12 07/15/2022 0908   CREATININE 0.74 07/15/2022 0908   CALCIUM 8.9 07/15/2022 0908   GFRNONAA >60 04/26/2022 1218   GFRAA >60 04/02/2020 0418    BNP    Component Value Date/Time   BNP 35.0 04/26/2022 1218    ProBNP    Component Value Date/Time   PROBNP 25.0 04/10/2022 1303    Imaging: No results found.   Assessment & Plan:   ILD (interstitial lung disease) ([place] [facility]) - Stable; Under observation. ILD felt to be mild based on breathing tests - ILD secondary to RA, probable UIP pattern - Pulmonary function testing today showed stable lung function/ diffusion capacity is normal  - HRCT scan from February 2022 showed no progression of interstitial lung disease since June 2019 - Defer further CT imaging unless patient develops worsening respiratory symptoms or lung function declines on testing  - Follow-up PFTs in 9 months    Rheumatoid arthritis ([place]) - Currently on Plaquenil - Weaned off chronic prednisone,  takes as needed for arthralgia  - Following with Dr. Pearlean Brownie, NP 10/29/2022

## 2022-10-29 NOTE — Assessment & Plan Note (Addendum)
-  Currently on Plaquenil - Weaned off chronic prednisone, takes as needed for arthralgia  - Following with Dr. Amil Amen

## 2022-10-29 NOTE — Patient Instructions (Addendum)
Pulmonary function testing today showed stable lung function/ diffusion capacity is normal  CT scan from February 2022 showed no progression of interstitial lung disease  Unless you develop any worsening respiratory symptoms or lung function declines on testing we will hold off on further CT scan of your chest  Recommendations: Continue Plaquenil per rheum Continue prednisone as needed per rheum Notify office if you develop any shortness of breath or chronic cough Recommend you get RSV vaccine and covid booster  High dose flu shot today if not already received  Orders: PFTs in 9 months  (ordered)  Follow-up: 9 months with Dr. Chase Caller (October 2024) or sooner if needed

## 2022-10-29 NOTE — Assessment & Plan Note (Addendum)
-  Stable; Under observation. ILD felt to be mild based on breathing tests - ILD secondary to RA, probable UIP pattern - Pulmonary function testing today showed stable lung function/ diffusion capacity is normal  - HRCT scan from February 2022 showed no progression of interstitial lung disease since June 2019 - Defer further CT imaging unless patient develops worsening respiratory symptoms or lung function declines on testing  - Follow-up PFTs in 9 months

## 2022-11-02 NOTE — Progress Notes (Signed)
PFT

## 2022-11-09 ENCOUNTER — Encounter: Payer: Self-pay | Admitting: Nurse Practitioner

## 2022-11-09 ENCOUNTER — Ambulatory Visit: Payer: Medicare Other | Admitting: Nurse Practitioner

## 2022-11-09 VITALS — BP 132/62 | HR 82 | Temp 97.8°F | Resp 16 | Ht 63.0 in | Wt 214.2 lb

## 2022-11-09 DIAGNOSIS — R8290 Unspecified abnormal findings in urine: Secondary | ICD-10-CM

## 2022-11-09 DIAGNOSIS — F418 Other specified anxiety disorders: Secondary | ICD-10-CM | POA: Diagnosis not present

## 2022-11-09 DIAGNOSIS — H8110 Benign paroxysmal vertigo, unspecified ear: Secondary | ICD-10-CM

## 2022-11-09 DIAGNOSIS — L299 Pruritus, unspecified: Secondary | ICD-10-CM

## 2022-11-09 DIAGNOSIS — R5383 Other fatigue: Secondary | ICD-10-CM

## 2022-11-09 DIAGNOSIS — R350 Frequency of micturition: Secondary | ICD-10-CM

## 2022-11-09 DIAGNOSIS — N3281 Overactive bladder: Secondary | ICD-10-CM

## 2022-11-09 DIAGNOSIS — R52 Pain, unspecified: Secondary | ICD-10-CM | POA: Insufficient documentation

## 2022-11-09 DIAGNOSIS — N898 Other specified noninflammatory disorders of vagina: Secondary | ICD-10-CM

## 2022-11-09 LAB — POC URINALSYSI DIPSTICK (AUTOMATED)
Bilirubin, UA: NEGATIVE
Blood, UA: NEGATIVE
Glucose, UA: NEGATIVE
Ketones, UA: NEGATIVE
Leukocytes, UA: NEGATIVE
Nitrite, UA: NEGATIVE
Protein, UA: NEGATIVE
Spec Grav, UA: 1.015 (ref 1.010–1.025)
Urobilinogen, UA: 0.2 E.U./dL
pH, UA: 5.5 (ref 5.0–8.0)

## 2022-11-09 MED ORDER — CETIRIZINE HCL 10 MG PO TABS
10.0000 mg | ORAL_TABLET | Freq: Every day | ORAL | 11 refills | Status: DC
Start: 2022-11-09 — End: 2022-11-09

## 2022-11-09 MED ORDER — BUSPIRONE HCL 5 MG PO TABS: 5.0000 mg | ORAL_TABLET | Freq: Every day | ORAL | 1 refills | Status: DC

## 2022-11-09 MED ORDER — HYDROXYZINE HCL 10 MG PO TABS: 10.0000 mg | ORAL_TABLET | Freq: Every day | ORAL | 0 refills | Status: DC

## 2022-11-09 NOTE — Assessment & Plan Note (Signed)
UA negative in office will send off for urine culture pending result

## 2022-11-09 NOTE — Assessment & Plan Note (Signed)
Still having issue when she rolls over in bed at night.  She has not tried the meclizine.  Will send in some hydroxyzine that she can try today instead of the meclizine.  Follow-up 1 month

## 2022-11-09 NOTE — Assessment & Plan Note (Signed)
Multifactorial.  Patient is sleeping a lot.  Defer labs today.  Chronic next office visit can check vitamin D and B12 at that juncture

## 2022-11-09 NOTE — Patient Instructions (Signed)
Nice to see you today I have sent in a medication for anxiety and itching.  Follow up with me in 1 month, sooner if you need me

## 2022-11-09 NOTE — Assessment & Plan Note (Addendum)
Complaining of discomfort in the shoulder girdle and shoulders and her upper portion of bilateral lower extremities does get better with prednisone.  She is followed by Dr. Amil Amen rheumatology informed her to bring up PMR with him and see what his ideas are

## 2022-11-09 NOTE — Assessment & Plan Note (Signed)
Patient states her frequency is no more increased than what was on the Vesicare agent.  She is interested in a nerve stimulator for her OAB.  Ambulatory referral to urology placed today

## 2022-11-09 NOTE — Assessment & Plan Note (Signed)
Related to her vulvar itching.  Hydroxyzine 10 mg nightly dizziness precautions reviewed

## 2022-11-09 NOTE — Progress Notes (Signed)
Acute Office Visit  Subjective:     Patient ID: [name], female    DOB: 1941-01-19, 82 y.o.   MRN: [id number]  Chief Complaint  Patient presents with   Medication Consultation   Urinary Frequency    Urinary Frequency  Associated symptoms include frequency. Pertinent negatives include no chills or hematuria.   Patient is in today for urinary frequency she does have a history of over active bladder, IL, RA, HTN, and    Patient had called in and asked to stop the vesicare '10mg'$  on 10/26/2022 as she felt that it was ineffective. States that she noticed it approx 1-2 weeks ago. States that she has odor and abnormal. States that she did discontinue the vesicare and states the frequency has been the same  States that she is having trouble getting up form a sitting position. States that she is followed by Rheum and he does not think it is related to her RA.  Patient also has difficulty raising her arms above shoulder height.  Patient is currently on hydroxychloroquine for RA.  She has had prednisone burst in the past that seem beneficial.  Patient has no neck injury or history of neck surgeries.  Patient does have some paresthesias but is more so when she is asleep in her chair not when she is awake.  States that she is constipated. States that she is using prunes and prune juice. States that it is helping.  She does have a history of using MiraLAX.  Patient states that was beneficial.  States she is having bowel movements about every day to every other day sometimes they are hard.  Vaginal itching: This has been a longstanding issue.  Patient is been evaluated by previous PCP.  We did try Zyrtec in the past which seemed to help some.  Patient declined pelvic exam today.  Anxiety: states that she is having some anxiety at night. States that she will get up to go to the bathroom and then she will start thinking about all types of things and make the difficulty to go back to  sleep.   Review of Systems  Constitutional:  Negative for chills and fever.  Respiratory:  Positive for shortness of breath (Baseline).   Cardiovascular:  Negative for chest pain.  Genitourinary:  Positive for frequency. Negative for dysuria and hematuria.  Psychiatric/Behavioral:  The patient has insomnia.           Objective:    BP 132/62   Pulse 82   Temp 97.8 F (36.6 C)   Resp 16   Ht '5\' 3"'$  (1.6 m)   Wt 214 lb 4 oz (97.2 kg)   SpO2 95%   BMI 37.95 kg/m  BP Readings from Last 3 Encounters:  11/09/22 132/62  10/29/22 138/62  08/21/22 128/64   Wt Readings from Last 3 Encounters:  11/09/22 214 lb 4 oz (97.2 kg)  10/29/22 213 lb (96.6 kg)  08/21/22 210 lb 3.2 oz (95.3 kg)      Physical Exam Vitals and nursing note reviewed.  Constitutional:      Appearance: Normal appearance.  HENT:     Right Ear: Tympanic membrane, ear canal and external ear normal.     Left Ear: Tympanic membrane, ear canal and external ear normal.  Cardiovascular:     Rate and Rhythm: Normal rate and regular rhythm.     Heart sounds: Normal heart sounds.  Pulmonary:     Effort: Pulmonary effort is normal.  Breath sounds: Normal breath sounds.  Abdominal:     General: Bowel sounds are normal. There is no distension.     Palpations: There is no mass.     Tenderness: There is no abdominal tenderness. There is no right CVA tenderness or left CVA tenderness.     Hernia: No hernia is present.  Neurological:     Mental Status: She is alert.     Results for orders placed or performed in visit on 11/09/22  POCT Urinalysis Dipstick (Automated)  Result Value Ref Range   Color, UA yellow    Clarity, UA clear    Glucose, UA Negative Negative   Bilirubin, UA Negative    Ketones, UA Negative    Spec Grav, UA 1.015 1.010 - 1.025   Blood, UA Negative    pH, UA 5.5 5.0 - 8.0   Protein, UA Negative Negative   Urobilinogen, UA 0.2 0.2 or 1.0 E.U./dL   Nitrite, UA Negative    Leukocytes,  UA Negative Negative        Assessment & Plan:   Problem List Items Addressed This Visit       Nervous and Auditory   Benign paroxysmal positional vertigo    Still having issue when she rolls over in bed at night.  She has not tried the meclizine.  Will send in some hydroxyzine that she can try today instead of the meclizine.  Follow-up 1 month        Genitourinary   OAB (overactive bladder)    Patient states her frequency is no more increased than what was on the Vesicare agent.  She is interested in a nerve stimulator for her OAB.  Ambulatory referral to urology placed today      Relevant Orders   Ambulatory referral to Urology     Other   Other fatigue    Multifactorial.  Patient is sleeping a lot.  Defer labs today.  Chronic next office visit can check vitamin D and B12 at that juncture      Abnormal urine odor    UA negative in office will send off for urine culture pending result      Relevant Orders   Urine Culture   Vaginal irritation    Longstanding thing.  Patient declined pelvic exam today.  Hydroxyzine for itching      Urinary urgency - Primary    Patient experiencing urgency has been off of Vesicare almost 2 weeks.  No great change.  UA in office negative.  Will send urine culture to make sure there is not an occult infection.  Pending result      Itching    Related to her vulvar itching.  Hydroxyzine 10 mg nightly dizziness precautions reviewed      Relevant Medications   hydrOXYzine (ATARAX) 10 MG tablet   Generalized pain    Complaining of discomfort in the shoulder girdle and shoulders and her upper portion of bilateral lower extremities does get better with prednisone.  She is followed by Dr. Amil Amen rheumatology informed her to bring up PMR with him and see what his ideas are      Situational anxiety   Relevant Medications   hydrOXYzine (ATARAX) 10 MG tablet    Meds ordered this encounter  Medications   DISCONTD: busPIRone (BUSPAR) 5 MG  tablet    Sig: Take 1 tablet (5 mg total) by mouth at bedtime.    Dispense:  30 tablet    Refill:  1  Order Specific Question:   Supervising Provider    Answer:   Loura Pardon A [[id number]]   DISCONTD: cetirizine (ZYRTEC) 10 MG tablet    Sig: Take 1 tablet (10 mg total) by mouth daily.    Dispense:  30 tablet    Refill:  11    Order Specific Question:   Supervising Provider    Answer:   Loura Pardon A [[id number]]   hydrOXYzine (ATARAX) 10 MG tablet    Sig: Take 1 tablet (10 mg total) by mouth at bedtime.    Dispense:  30 tablet    Refill:  0    Order Specific Question:   Supervising Provider    Answer:   Loura Pardon A [[id number]]    Return in about 4 weeks (around 12/07/2022) for Chronic condiditons. itching/anxiety.  Romilda Garret, NP

## 2022-11-09 NOTE — Assessment & Plan Note (Signed)
Longstanding thing.  Patient declined pelvic exam today.  Hydroxyzine for itching

## 2022-11-09 NOTE — Assessment & Plan Note (Signed)
Patient experiencing urgency has been off of Vesicare almost 2 weeks.  No great change.  UA in office negative.  Will send urine culture to make sure there is not an occult infection.  Pending result

## 2022-11-10 LAB — URINE CULTURE
MICRO NUMBER:: 14519134
SPECIMEN QUALITY:: ADEQUATE

## 2022-11-11 ENCOUNTER — Telehealth: Payer: Self-pay | Admitting: Nurse Practitioner

## 2022-11-11 ENCOUNTER — Encounter: Payer: Self-pay | Admitting: *Deleted

## 2022-11-11 NOTE — Telephone Encounter (Signed)
Notes faxed.

## 2022-11-11 NOTE — Telephone Encounter (Signed)
Patients RA Doctor Dr Ludger Nutting is requesting office notes from visit on 11/09/2022. Patient is scheduled to see him on tomorrow 11/12/2022 and needs these notes before visit please. Fax number:[phone]

## 2022-12-09 ENCOUNTER — Ambulatory Visit: Payer: Medicare Other | Admitting: Nurse Practitioner

## 2023-01-14 ENCOUNTER — Other Ambulatory Visit: Payer: Self-pay | Admitting: Hematology and Oncology

## 2023-01-14 DIAGNOSIS — Z0000 Encounter for general adult medical examination without abnormal findings: Secondary | ICD-10-CM

## 2023-02-25 ENCOUNTER — Telehealth: Payer: Self-pay | Admitting: *Deleted

## 2023-02-25 NOTE — Telephone Encounter (Signed)
Spoke with the patient regarding the referral to GYN oncology. Patient scheduled as new patient with Dr Alvester Morin on 6/17 at 11:15 am.  Patient given an arrival time of 10:45 am.  Explained to the patient the the doctor will perform a pelvic exam at this visit. Patient given the policy that only one visitor allowed and that visitor must be over 16 yrs are allowed in the Cancer Center. Patient given the address/phone number for the clinic and that the center offers free valet service. Patient aware that masks are option.

## 2023-03-15 ENCOUNTER — Other Ambulatory Visit: Payer: Self-pay | Admitting: Nurse Practitioner

## 2023-03-19 ENCOUNTER — Encounter: Payer: Self-pay | Admitting: Psychiatry

## 2023-03-22 ENCOUNTER — Encounter: Payer: Self-pay | Admitting: Psychiatry

## 2023-03-22 ENCOUNTER — Other Ambulatory Visit: Payer: Self-pay

## 2023-03-22 ENCOUNTER — Inpatient Hospital Stay: Payer: Medicare Other | Attending: Psychiatry | Admitting: Psychiatry

## 2023-03-22 ENCOUNTER — Inpatient Hospital Stay: Payer: Medicare Other

## 2023-03-22 VITALS — BP 144/65 | HR 78 | Temp 97.8°F | Ht 62.99 in | Wt 224.6 lb

## 2023-03-22 DIAGNOSIS — R600 Localized edema: Secondary | ICD-10-CM | POA: Insufficient documentation

## 2023-03-22 DIAGNOSIS — Z923 Personal history of irradiation: Secondary | ICD-10-CM | POA: Diagnosis not present

## 2023-03-22 DIAGNOSIS — Z79899 Other long term (current) drug therapy: Secondary | ICD-10-CM | POA: Diagnosis not present

## 2023-03-22 DIAGNOSIS — Z853 Personal history of malignant neoplasm of breast: Secondary | ICD-10-CM | POA: Diagnosis not present

## 2023-03-22 DIAGNOSIS — J8410 Pulmonary fibrosis, unspecified: Secondary | ICD-10-CM | POA: Insufficient documentation

## 2023-03-22 DIAGNOSIS — Z7901 Long term (current) use of anticoagulants: Secondary | ICD-10-CM | POA: Diagnosis not present

## 2023-03-22 DIAGNOSIS — J849 Interstitial pulmonary disease, unspecified: Secondary | ICD-10-CM

## 2023-03-22 DIAGNOSIS — C519 Malignant neoplasm of vulva, unspecified: Secondary | ICD-10-CM

## 2023-03-22 DIAGNOSIS — I471 Supraventricular tachycardia, unspecified: Secondary | ICD-10-CM | POA: Insufficient documentation

## 2023-03-22 DIAGNOSIS — I10 Essential (primary) hypertension: Secondary | ICD-10-CM | POA: Diagnosis not present

## 2023-03-22 DIAGNOSIS — Z87891 Personal history of nicotine dependence: Secondary | ICD-10-CM | POA: Diagnosis not present

## 2023-03-22 DIAGNOSIS — E785 Hyperlipidemia, unspecified: Secondary | ICD-10-CM | POA: Diagnosis not present

## 2023-03-22 DIAGNOSIS — Z86711 Personal history of pulmonary embolism: Secondary | ICD-10-CM

## 2023-03-22 DIAGNOSIS — D6861 Antiphospholipid syndrome: Secondary | ICD-10-CM | POA: Insufficient documentation

## 2023-03-22 DIAGNOSIS — R32 Unspecified urinary incontinence: Secondary | ICD-10-CM | POA: Diagnosis not present

## 2023-03-22 DIAGNOSIS — K219 Gastro-esophageal reflux disease without esophagitis: Secondary | ICD-10-CM | POA: Insufficient documentation

## 2023-03-22 DIAGNOSIS — Z79890 Hormone replacement therapy (postmenopausal): Secondary | ICD-10-CM | POA: Insufficient documentation

## 2023-03-22 DIAGNOSIS — M069 Rheumatoid arthritis, unspecified: Secondary | ICD-10-CM | POA: Diagnosis not present

## 2023-03-22 LAB — URINALYSIS, COMPLETE (UACMP) WITH MICROSCOPIC
Bilirubin Urine: NEGATIVE
Glucose, UA: NEGATIVE mg/dL
Hgb urine dipstick: NEGATIVE
Ketones, ur: NEGATIVE mg/dL
Nitrite: NEGATIVE
Protein, ur: NEGATIVE mg/dL
Specific Gravity, Urine: 1.019 (ref 1.005–1.030)
pH: 5 (ref 5.0–8.0)

## 2023-03-22 NOTE — Progress Notes (Signed)
GYNECOLOGIC ONCOLOGY NEW PATIENT CONSULTATION  Date of Service: 03/22/2023 Referring Provider: Mitchel Honour, DO   ASSESSMENT AND PLAN: [name] is a 82 y.o. 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  We reviewed the nature of Paget's disease of the vulva.  Reviewed that her vulvar itching is likely related to her paget's. We reviewed that the majority of Paget's disease of the vulva is not invasive and thus does not require a radical vulvectomy, but instead a simple partial vulvectomy/wide local excision.  We also reviewed the association of Paget's with an underlying invasive adenocarcinoma or a noncontiguous carcinoma.  Thus, we reviewed evaluation to rule out additional synchronous disease.  Patient is up-to-date on her mammogram.  Her last colonoscopy was in 2010 but with a Cologuard negative in 2018.  She does have a prior pelvic ultrasound a few years ago that was unrevealing.  Updated pelvic ultrasound ordered.  Patient does not appear to have had a cystoscopy in the past, but will get a urinalysis and urine cytology today.  Based on exam today, I do have concerns that she may have extensive involvement of her vulva, bilaterally, extending from the level of her clitoris anteriorly to her rectum posteriorly.  Biopsies obtained today to evaluate both the extent of disease as well as a few scouting biopsies to assess margins.  We will follow-up on these results.  We discussed that first-line treatment is a simple partial vulvectomy or wide local excision of the vulva.  Other treatment, like radiation or topical treatments like with imiquimod, are not well-defined in this patient population.  But would consider topical treatments in the future if needed based on the current extent of disease.  If if biopsies confirm extensive involvement of vulva, surgical resection would be fairly extensive.  Would recommend plastic surgery consultation in the setting for consideration of complex  closure.  In this case, would recommend surgical treatment at [facility] for collaboration with plastic surgery there.  Additionally, patient is undergoing urodynamic studies with [facility] on 2023/04/18 for her urinary incontinence.  Would try to coordinate urgent referral to urogynecology at [facility] for consideration of joint procedure.  However, discussed that this may not be feasible to coordinate in a timely fashion as well as to coordinate 3 surgical teams.  But we will attempt.  If unable to coordinate [facility] urogynecology, would prioritize surgical resection and plastic surgery repair.  Patient would potentially require indwelling Foley for extended period of time to allow for proper healing of wound, and then could have incontinence procedures performed in the future.  Will need to arrange clearance from pulmonology as well as obtain anticoagulation plan from Dr. Pamelia Hoit regarding her Xarelto and antiphospholipid antibody syndrome.   A copy of this note was sent to the patient's referring provider.  Clide Cliff, MD Gynecologic Oncology   Medical Decision Making I personally spent  TOTAL 80 minutes face-to-face and non-face-to-face in the care of this patient, which includes all pre, intra, and post visit time on the date of service.   ------------  CC: Paget's disease of the vulva  HISTORY OF PRESENT ILLNESS:  [name] is a 82 y.o. woman who is seen in consultation at the request of Mitchel Honour, DO for evaluation of paget's disease of the vulva.  Patient was recently seen by her OB/GYN for ongoing vulvar itching.  She underwent a vulvar biopsy on 02/19/2023 which resulted with extramammary Paget's disease, biopsy taken from the left labia.  Today, patient presents  with one of her daughters with another daughter on the phone.  She reports having vulvar itching that has been ongoing for approximately 15 years.  She reports treatment with "pills" on and off over the years for  possible vaginitis or urinary tract infections.  She denies any prior biopsies.  She notes that the itching has gotten worse over the past year.  She also has a history of urinary incontinence, for which she is seeking evaluation with [facility].  She was not sure if the persistent incontinence was impacting her vulvar symptoms.  She and her daughter report that the patient has had treatment with neurostimulation.  She also notes that she was recently given a prescription for estrogen and steroid cream, but the steroid cream burned so she discontinued this.  This was few months ago per patient report.  Given no relief in symptoms she was sent to see her OB/GYN.  Otherwise, patient has a history of DCIS diagnosed in 2020, treated with surgery and radiation.  She also has a history of pulmonary fibrosis.  She follows with Dr. Marchelle Gearing, last seen 01/2022, more recently seen by APP, Ames Dura, NP, in January following PFTs.  She also notes a year history of bilateral lower extremity edema.  She has seen Dr. London Sheer at a local vein clinic for this.  She is supposed to wear compression hose and has a compression device at home.  She wears the compression hose occasionally. 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 Pamelia Hoit.  She is otherwise up-to-date on mammograms.  She reports her last colonoscopy was in 2010 and had a negative Cologuard in 2018.  At first, she thought she may have had a cystoscopy 2 years ago, but on further clarification it sounds like this was a pelvic ultrasound she was recalling.  She denies a history of abnormal Pap smears.   TREATMENT HISTORY: Oncology History  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/27/2019 Initial Diagnosis   Patient reported two weeks of spontaneous right nipple discharge. Mammogram on 07/04/19 showed no evidence of right breast malignancy with an ulcerated right  nipple lesion suspicious for Paget's disease and a 0.8cm mass in the left breast. Biopsy on 07/05/19 showed fibrocystic changes in the left breast, no evidence of malignancy. Breast MRI on 08/11/19 showed a 1.7cm right nipple base mass, a 0.9cm superior central right breast mass, and indeterminate non-mass enhancement in the central left breast. Biopsy on 08/24/19 showed no evidence of malignancy in the left breast, and in the right breast, DCIS with necrosis, high grade, ER+ 100%, PR+ 70%.    09/20/2019 Surgery   Bilateral lumpectomies ([facility]): Left breast: complex sclerosing lesion with no evidence of malignancy Right breast: nipple adenoma with intraductal papilloma and DCIS, intermediate grade, 0.4cm, clear margins.   11/28/2019 -  Radiation Therapy   Adjuvant radiation     PAST MEDICAL HISTORY: Past Medical History:  Diagnosis Date   Antiphospholipid antibody syndrome (HCC)    Breast cancer    breast   Cataract    diagnosed by Dr. Rubye Oaks   Dysrhythmia    PSVT/ palpitations-  controlled with prn Inderal   GERD (gastroesophageal reflux disease)    30 years ago.    History of pulmonary embolism    Hyperlipidemia    statin intolerant   Hypertension    eccho 4/13 EPIC   Migraines    Obesity  Osteoarthritis    Personal history of radiation therapy    right breast   Phlebitis of leg, right, superficial 1986/1989   x 2   Pulmonary fibrosis (HCC)    RA (rheumatoid arthritis) (HCC)     PAST SURGICAL HISTORY: Past Surgical History:  Procedure Laterality Date   BREAST BIOPSY Left 07/05/2019   BREAST BIOPSY Bilateral 08/24/2019   BREAST EXCISIONAL BIOPSY Left 09/20/2019   BREAST LUMPECTOMY Right 09/20/2019   BREAST LUMPECTOMY WITH RADIOACTIVE SEED LOCALIZATION Bilateral 09/20/2019   Procedure: BILATERAL BREAST LUMPECTOMIES WITH BILATERAL RADIOACTIVE SEED LOCALIZATION AND RIGHT NIPPLE BIOPSY;  Surgeon: Harriette Bouillon, MD;  Location:  SUR[facility]ERY [facility];  Service:  General;  Laterality: Bilateral;   CARDIAC CATHETERIZATION  1990s   negative   CARDIOVASCULAR STRESS TEST  2008   NML   CARDIOVASCULAR STRESS TEST  02/02/2007   EF 70%, NO ISCHEMIA   CARPAL TUNNEL RELEASE Bilateral    DILATION AND CURETTAGE, DIAGNOSTIC / THERAPEUTIC  2008   LESION DESTRUCTION  10/2017   Face; Dr. Yetta Barre   ROTATOR CUFF REPAIR  1999   Right, left in 2002   TOTAL KNEE ARTHROPLASTY  2008   Left   TOTAL KNEE ARTHROPLASTY  10/11/2012   Procedure: TOTAL KNEE ARTHROPLASTY;  Surgeon: Shelda Pal, MD;  Location: [facility];  Service: Orthopedics;  Laterality: Right;   US ECHOCARDIOGRAPHY  02/07/2007   EF 55-60%    OB/GYN HISTORY: OB History  Gravida Para Term Preterm AB Living  7 6 6   1 5   SAB IAB Ectopic Multiple Live Births  1       6    # Outcome Date GA Lbr Len/2nd Weight Sex Delivery Anes PTL Lv  7 Term      Vag-Spont   LIV  6 Term      Vag-Spont   LIV  5 Term      Vag-Spont   LIV  4 Term      Vag-Spont   LIV  3 SAB           2 Term      Vag-Spont   LIV  1 Term      Vag-Spont   ND      Age at menarche: 22 Age at menopause: 2 Hx of HRT: Oral HRT for short time after menopause Hx of STI: No Last pap: unknown History of abnormal pap smears: no  SCREENING STUDIES:  Last mammogram: 08/07/2022 Last colonoscopy: 2010, cologuard 2018 negative  MEDICATIONS:  Current Outpatient Medications:    acetaminophen (TYLENOL) 500 MG tablet, Take 1,000 mg by mouth in the morning and at bedtime., Disp: , Rfl:    estradiol (ESTRACE) 0.1 MG/GM vaginal cream, 0.5 g of cream intravaginally administered daily for 2 weeks, then reduce to twice weekly, Disp: 42.5 g, Rfl: 12   folic acid (FOLVITE) 1 MG tablet, Take 2 mg by mouth daily., Disp: , Rfl:    hydroxychloroquine (PLAQUENIL) 200 MG tablet, Take 200 mg by mouth 2 (two) times daily., Disp: , Rfl:    losartan (COZAAR) 25 MG tablet, Take 1 tablet by mouth once daily, Disp: 90 tablet, Rfl: 0   predniSONE (DELTASONE) 5 MG  tablet, Take by mouth., Disp: , Rfl:    Vitamin D, Cholecalciferol, 25 MCG (1000 UT) TABS, Take 1 tablet by mouth 2 (two) times daily., Disp: , Rfl:    XARELTO 20 MG TABS tablet, TAKE 1 TABLET BY MOUTH  DAILY WITH SUPPER, Disp: 90 tablet, Rfl:  3  ALLERGIES: Allergies  Allergen Reactions   Codeine Nausea And Vomiting   Hydrocodone Bit-Homatrop Mbr Other (See Comments)    Hallucinations     FAMILY HISTORY: Family History  Problem Relation Age of Onset   Hypertension Mother    Heart attack Father 41   Hypertension Father    Peptic Ulcer Disease Father    Heart defect Sister    Lung cancer Brother    Coronary artery disease Other        Female 1st degree relative <50   Colon cancer Neg Hx    Prostate cancer Neg Hx    Ovarian cancer Neg Hx    Pancreatic cancer Neg Hx    Endometrial cancer Neg Hx    Breast cancer Neg Hx     SOCIAL HISTORY: Social History   Socioeconomic History   Marital status: Married    Spouse name: Not on file   Number of children: Not on file   Years of education: Not on file   Highest education level: Not on file  Occupational History   Occupation: Retired  Tobacco Use   Smoking status: Former    Packs/day: 0.50    Years: 10.00    Additional pack years: 0.00    Total pack years: 5.00    Types: Cigarettes    Start date: 53    Quit date: 10/06/1963    Years since quitting: 59.5   Smokeless tobacco: Never  Vaping Use   Vaping Use: Never used  Substance and Sexual Activity   Alcohol use: No    Comment: stopped 5 years ago   Drug use: No   Sexual activity: Yes  Other Topics Concern   Not on file  Social History Narrative   Regular exercise: yes walks 1 mile a day   Diet: loves butter, fruit and veggies   Social Determinants of Health   Financial Resource Strain: Low Risk  (02/25/2022)   Overall Financial Resource Strain (CARDIA)    Difficulty of Paying Living Expenses: Not hard at all  Food Insecurity: No Food Insecurity (02/25/2022)    Hunger Vital Sign    Worried About Running Out of Food in the Last Year: Never true    Ran Out of Food in the Last Year: Never true  Transportation Needs: No Transportation Needs (02/25/2022)   PRAPARE - Administrator, Civil Service (Medical): No    Lack of Transportation (Non-Medical): No  Physical Activity: Sufficiently Active (02/25/2022)   Exercise Vital Sign    Days of Exercise per Week: 7 days    Minutes of Exercise per Session: 60 min  Stress: No Stress Concern Present (02/25/2022)   [employer] of Occupational Health - Occupational Stress Questionnaire    Feeling of Stress : Not at all  Social Connections: Not on file  Intimate Partner Violence: Not At Risk (05/10/2020)   Humiliation, Afraid, Rape, and Kick questionnaire    Fear of Current or Ex-Partner: No    Emotionally Abused: No    Physically Abused: No    Sexually Abused: No    REVIEW OF SYSTEMS: New patient intake form was reviewed.  Complete 10-system review is negative except for the following: Shortness of breath, urinary frequency, joint pain, wheezing, back pain, numbness left arm, fatigue, incontinence, swelling of the legs  PHYSICAL EXAM: BP (!) 144/65   Pulse 78   Temp 97.8 F (36.6 C)   Ht 5' 2.99" (1.6 m)   Wt 224 lb 9.6  oz (101.9 kg)   SpO2 95%   BMI 39.80 kg/m  Constitutional: No acute distress. Neuro/Psych: Alert, oriented.  Head and Neck: Normocephalic, atraumatic. Neck symmetric without masses. Sclera anicteric.  Respiratory: Normal work of breathing. Clear to auscultation bilaterally. Cardiovascular: Regular rate and rhythm, no murmurs, rubs, or gallops. Abdomen: Normoactive bowel sounds. Soft, non-distended, non-tender to palpation. No masses appreciated.  Extremities: Grossly normal range of motion. Warm, well perfused.  Skin: No rashes or lesions. Lymphatic: No cervical, supraclavicular, or inguinal adenopathy. Genitourinary: External genitalia with extensive erythema,  inflammatory appearing changes involving bilateral labia majora, more extensively on the left labia, extending from the level of the clitoris anteriorly, to the perineal body and surrounding the anus posteriorly.  See diagram below.  Urethral meatus without lesions or prolapse. On speculum exam, vagina and cervix without lesions. Bimanual exam reveals normal cervix, mobile uterus, no adnexal mass. Exam chaperoned by Warner Mccreedy, NP   Physical Exam Genitourinary:    VULVAR BIOPSY  [name] is a 82 y.o. woman who presents today for a vulvar biopsy, see details of the visit above.  The procedure was explained to the patient and verbal consent was obtained prior to the procedure.  The skin was cleaned with Betadine.  3 ml total of 1% lidocaine was injected at the planned biopsy sites.  A 3 mm punch biopsy was used to obtain the biopsy specimens at the locations noted above.  Hemostasis was achieved with silver nitrate.  Patient tolerated the procedure well.    LABORATORY AND RADIOLOGIC DATA: Outside medical records were reviewed to synthesize the above history, along with the history and physical obtained during the visit.  Outside laboratory, pathology, and imaging reports were reviewed, with pertinent results below.  I personally reviewed the outside images.  WBC  Date Value Ref Range Status  07/15/2022 6.2 4.0 - 10.5 K/uL Final   Hemoglobin  Date Value Ref Range Status  07/15/2022 12.6 12.0 - 15.0 g/dL Final   HCT  Date Value Ref Range Status  07/15/2022 37.8 36.0 - 46.0 % Final   Platelets  Date Value Ref Range Status  07/15/2022 212.0 150.0 - 400.0 K/uL Final   Creatinine, Ser  Date Value Ref Range Status  07/15/2022 0.74 0.40 - 1.20 mg/dL Final   AST  Date Value Ref Range Status  07/15/2022 17 0 - 37 U/L Final   ALT  Date Value Ref Range Status  07/15/2022 14 0 - 35 U/L Final    Surgical pathology (02/19/23) 1.  Vulva, biopsy, left labia posterior majora:  Extramammary Paget's disease 2.  Vulva, biopsy, left labia inferior majora: Extramammary Paget's disease  Pathology (11/02/16): Cologuard: Negative  US Pelvic Complete With Transvaginal 05/24/2020  Narrative CLINICAL DATA:  Postmenopausal bleeding  EXAM: TRANSABDOMINAL AND TRANSVAGINAL ULTRASOUND OF PELVIS  TECHNIQUE: Both transabdominal and transvaginal ultrasound examinations of the pelvis were performed. Transabdominal technique was performed for global imaging of the pelvis including uterus, ovaries, adnexal regions, and pelvic cul-de-sac. It was necessary to proceed with endovaginal exam following the transabdominal exam to visualize the uterus endometrium ovaries.  COMPARISON:  MRI 10/15/2005  FINDINGS: Uterus  Measurements: 10 x 4 x 5 cm = volume: 103 mL. Numerous echogenic shadowing foci in the myometrium consistent with calcifications.  Endometrium  Unable to measure, obscured by calcifications in the myometrium  Right ovary  Not seen  Left ovary  Not seen  Other findings  No abnormal free fluid.  IMPRESSION: 1. Poorly visible endometrial stripe  secondary to diffuse myometrial calcifications. 2. Diffuse echogenic myometrial foci with shadowing likely diffuse calcifications. This could be secondary to fibroids, vascular calcification, or dystrophic calcification related to prior infection or inflammatory process. 3. Nonvisualized ovaries   Electronically Signed By: Jasmine Pang M.D. On: 05/24/2020 15:17

## 2023-03-23 ENCOUNTER — Telehealth: Payer: Self-pay | Admitting: Psychiatry

## 2023-03-23 LAB — CYTOLOGY - NON PAP

## 2023-03-23 NOTE — Telephone Encounter (Signed)
Called pt regarding recommendation for referral to [facility] or possible joint procedure. Discussed that I will refer to urogyn as well but not sure if we will be successful getting a joint procedure coordinated with all 3 teams in a timely fashion, but we will try. Could be that she may require a indwelling foley for some time for healing if unable to also perform urogyn incontinence procedure at time of case. Will keep pt updated.

## 2023-03-24 ENCOUNTER — Encounter: Payer: Self-pay | Admitting: Psychiatry

## 2023-03-24 LAB — SURGICAL PATHOLOGY

## 2023-03-28 ENCOUNTER — Other Ambulatory Visit: Payer: Self-pay | Admitting: Nurse Practitioner

## 2023-03-29 ENCOUNTER — Other Ambulatory Visit: Payer: Self-pay | Admitting: Nurse Practitioner

## 2023-03-29 ENCOUNTER — Ambulatory Visit (HOSPITAL_COMMUNITY)
Admission: RE | Admit: 2023-03-29 | Discharge: 2023-03-29 | Disposition: A | Discharge status: Home / Self Care | Payer: Medicare Other | Source: Ambulatory Visit | Attending: Psychiatry | Admitting: Psychiatry

## 2023-03-29 DIAGNOSIS — C519 Malignant neoplasm of vulva, unspecified: Secondary | ICD-10-CM | POA: Diagnosis present

## 2023-03-29 NOTE — Telephone Encounter (Signed)
Will refuse just filled 03/15/2023 for 90 day

## 2023-03-31 ENCOUNTER — Encounter: Payer: Self-pay | Admitting: Psychiatry

## 2023-04-05 ENCOUNTER — Encounter: Payer: Self-pay | Admitting: Psychiatry

## 2023-04-05 ENCOUNTER — Inpatient Hospital Stay: Payer: Medicare Other | Attending: Psychiatry | Admitting: Psychiatry

## 2023-04-05 ENCOUNTER — Encounter: Payer: Self-pay | Admitting: Cardiovascular Disease

## 2023-04-05 ENCOUNTER — Other Ambulatory Visit: Payer: Self-pay

## 2023-04-05 ENCOUNTER — Encounter: Payer: Self-pay | Admitting: Internal Medicine

## 2023-04-05 VITALS — BP 149/67 | HR 82 | Temp 98.5°F | Ht 62.99 in | Wt 224.0 lb

## 2023-04-05 DIAGNOSIS — Z86711 Personal history of pulmonary embolism: Secondary | ICD-10-CM | POA: Diagnosis not present

## 2023-04-05 DIAGNOSIS — J8410 Pulmonary fibrosis, unspecified: Secondary | ICD-10-CM | POA: Insufficient documentation

## 2023-04-05 DIAGNOSIS — R0601 Orthopnea: Secondary | ICD-10-CM | POA: Insufficient documentation

## 2023-04-05 DIAGNOSIS — R32 Unspecified urinary incontinence: Secondary | ICD-10-CM | POA: Diagnosis not present

## 2023-04-05 DIAGNOSIS — C519 Malignant neoplasm of vulva, unspecified: Secondary | ICD-10-CM | POA: Insufficient documentation

## 2023-04-05 DIAGNOSIS — Z923 Personal history of irradiation: Secondary | ICD-10-CM | POA: Diagnosis not present

## 2023-04-05 DIAGNOSIS — Z7901 Long term (current) use of anticoagulants: Secondary | ICD-10-CM | POA: Insufficient documentation

## 2023-04-05 DIAGNOSIS — N3946 Mixed incontinence: Secondary | ICD-10-CM | POA: Diagnosis not present

## 2023-04-05 DIAGNOSIS — D0511 Intraductal carcinoma in situ of right breast: Secondary | ICD-10-CM | POA: Diagnosis not present

## 2023-04-05 DIAGNOSIS — C4499 Other specified malignant neoplasm of skin, unspecified: Secondary | ICD-10-CM

## 2023-04-05 DIAGNOSIS — J849 Interstitial pulmonary disease, unspecified: Secondary | ICD-10-CM

## 2023-04-05 NOTE — Progress Notes (Signed)
Gynecologic Oncology Return Clinic Visit  Date of Service: 04/05/2023 Referring Provider: Mitchel Honour, DO   Assessment & Plan: [name] is a 82 y.o. 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  Reviewed biopsy results.  All biopsies were positive for Paget's and areas with Zurn and also areas that visually appeared normal.  Reviewed that this demonstrates fairly extensive disease of the vulva bilaterally as well as anteriorly involving the clitoris and all the way posteriorly to the perianal region.  Reviewed that surgery would require a fairly extensive wide local excision of the vulva to remove involved tissue.  Likely will have positive margins given that some of the scouting biopsies were positive even in areas with normal appearing tissue.  I suggest that we focus on removing then all of the inflamed, visually abnormal appearing tissue to help control patient's symptoms.  But balance this with limiting extent of resection as to not minimize additional symptoms given an extensive resection requiring extensive closure and risk for the postoperative complications.  Once healed, we could treat remainder of positive margins with Aldara if needed or monitor for recurrence of symptoms to balance quality of life.  Given that an extensive resection will be required, have recommended that patient see plastic surgery at [facility] for coordinated procedure for complex closure.  We are planning for a joint procedure on 04/29/2023.  Patient will see Dr. Almedia Balls on 04/14/2023 for consultation preoperatively.  Patient was consented for: pelvic exam under anesthesia, wide local excision of the vulva including removal of clitoris on 04/29/23 at [facility].  The risks of surgery were discussed in detail and she understands these to including but not limited to bleeding requiring a blood transfusion, infection, injury to adjacent organs (including but not limited to the bowels, bladder, ureters, nerves, blood vessels),  thromboembolic events, wound separation, unforseen complication, possible need for re-exploration, and medical complications such as heart attack, stroke, pneumonia.  If the patient experiences any of these events, she understands that her hospitalization or recovery may be prolonged and that she may need to take additional medications for a prolonged period. The patient will receive DVT and antibiotic prophylaxis as indicated. She voiced a clear understanding. She had the opportunity to ask questions and informed consent was obtained today. She wishes to proceed.  We will plan for clearance from several providers given her medical history.  We will request clearance from her pulmonologist, Dr. Marchelle Gearing, given her history of pulmonary fibrosis. We will also request clearance from her cardiologist, Dr. Eden Emms, given recent symptoms of nocturnal orthopnea. And we will request clearance from her oncologist, Dr. 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 We discussed holding her xarelto 2 days prior to surgery and on day of surgery. Plan heparin on DOS in preop.   We will also discuss her plan with Dr. 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   All preoperative instructions were reviewed. Postoperative expectations were also reviewed. Written handouts were provided to the patient.  RTC Postop.  Clide Cliff, MD Gynecologic Oncology   Medical Decision Making I personally spent  TOTAL 60 minutes face-to-face and non-face-to-face in the care of this patient, which includes all pre, intra, and post visit time on the date of service.    ----------------------- Reason for Visit: Follow-up, treatment planning  Treatment History: Oncology History  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/27/2019 Initial Diagnosis   Patient reported two weeks of spontaneous  right nipple discharge. Mammogram on 07/04/19 showed no  evidence of right breast malignancy with an ulcerated right nipple lesion suspicious for Paget's disease and a 0.8cm mass in the left breast. Biopsy on 07/05/19 showed fibrocystic changes in the left breast, no evidence of malignancy. Breast MRI on 08/11/19 showed a 1.7cm right nipple base mass, a 0.9cm superior central right breast mass, and indeterminate non-mass enhancement in the central left breast. Biopsy on 08/24/19 showed no evidence of malignancy in the left breast, and in the right breast, DCIS with necrosis, high grade, ER+ 100%, PR+ 70%.    09/20/2019 Surgery   Bilateral lumpectomies ([facility]): Left breast: complex sclerosing lesion with no evidence of malignancy Right breast: nipple adenoma with intraductal papilloma and DCIS, intermediate grade, 0.4cm, clear margins.   11/28/2019 -  Radiation Therapy   Adjuvant radiation     Interval History: Patient presents today with her daughter.  Another daughter is on the phone.  Overall unchanged from her last visit.  She does however note that in the past 2 months she has noticed that when she is lying flat at night, she has occasional shortness of breath when sleeping on 1 side.  She will moved to her recliner and then is able to sleep without issue.  She generally sleeps with 1 pillow   Past Medical/Surgical History: Past Medical History:  Diagnosis Date   Antiphospholipid antibody syndrome (HCC)    Breast cancer    breast   Cataract    diagnosed by Dr. Rubye Oaks   Dysrhythmia    PSVT/ palpitations-  controlled with prn Inderal   GERD (gastroesophageal reflux disease)    30 years ago.    History of pulmonary embolism    Hyperlipidemia    statin intolerant   Hypertension    eccho 4/13 EPIC   Migraines    Obesity    Osteoarthritis    Personal history of radiation therapy    right breast   Phlebitis of leg, right, superficial 1986/1989   x 2   Pulmonary fibrosis (HCC)    RA (rheumatoid arthritis) (HCC)     Past Surgical  History:  Procedure Laterality Date   BREAST BIOPSY Left 07/05/2019   BREAST BIOPSY Bilateral 08/24/2019   BREAST EXCISIONAL BIOPSY Left 09/20/2019   BREAST LUMPECTOMY Right 09/20/2019   BREAST LUMPECTOMY WITH RADIOACTIVE SEED LOCALIZATION Bilateral 09/20/2019   Procedure: BILATERAL BREAST LUMPECTOMIES WITH BILATERAL RADIOACTIVE SEED LOCALIZATION AND RIGHT NIPPLE BIOPSY;  Surgeon: Harriette Bouillon, MD;  Location: [facility];  Service: General;  Laterality: Bilateral;   CARDIAC CATHETERIZATION  1990s   negative   CARDIOVASCULAR STRESS TEST  2008   NML   CARDIOVASCULAR STRESS TEST  02/02/2007   EF 70%, NO ISCHEMIA   CARPAL TUNNEL RELEASE Bilateral    DILATION AND CURETTAGE, DIAGNOSTIC / THERAPEUTIC  2008   LESION DESTRUCTION  10/2017   Face; Dr. Yetta Barre   ROTATOR CUFF REPAIR  1999   Right, left in 2002   TOTAL KNEE ARTHROPLASTY  2008   Left   TOTAL KNEE ARTHROPLASTY  10/11/2012   Procedure: TOTAL KNEE ARTHROPLASTY;  Surgeon: Shelda Pal, MD;  Location: [facility];  Service: Orthopedics;  Laterality: Right;   US ECHOCARDIOGRAPHY  02/07/2007   EF 55-60%    Family History  Problem Relation Age of Onset   Hypertension Mother    Heart attack Father 73   Hypertension Father    Peptic Ulcer Disease Father    Heart defect Sister  Lung cancer Brother    Coronary artery disease Other        Female 1st degree relative <50   Colon cancer Neg Hx    Prostate cancer Neg Hx    Ovarian cancer Neg Hx    Pancreatic cancer Neg Hx    Endometrial cancer Neg Hx    Breast cancer Neg Hx     Social History   Socioeconomic History   Marital status: Married    Spouse name: Not on file   Number of children: Not on file   Years of education: Not on file   Highest education level: Not on file  Occupational History   Occupation: Retired  Tobacco Use   Smoking status: Former    Packs/day: 0.50    Years: 10.00    Additional pack years: 0.00    Total pack years: 5.00    Types:  Cigarettes    Start date: 57    Quit date: 10/06/1963    Years since quitting: 59.5   Smokeless tobacco: Never  Vaping Use   Vaping Use: Never used  Substance and Sexual Activity   Alcohol use: No    Comment: stopped 5 years ago   Drug use: No   Sexual activity: Yes  Other Topics Concern   Not on file  Social History Narrative   Regular exercise: yes walks 1 mile a day   Diet: loves butter, fruit and veggies   Social Determinants of Health   Financial Resource Strain: Low Risk  (02/25/2022)   Overall Financial Resource Strain (CARDIA)    Difficulty of Paying Living Expenses: Not hard at all  Food Insecurity: No Food Insecurity (02/25/2022)   Hunger Vital Sign    Worried About Running Out of Food in the Last Year: Never true    Ran Out of Food in the Last Year: Never true  Transportation Needs: No Transportation Needs (02/25/2022)   PRAPARE - Administrator, Civil Service (Medical): No    Lack of Transportation (Non-Medical): No  Physical Activity: Sufficiently Active (02/25/2022)   Exercise Vital Sign    Days of Exercise per Week: 7 days    Minutes of Exercise per Session: 60 min  Stress: No Stress Concern Present (02/25/2022)   [employer] of Occupational Health - Occupational Stress Questionnaire    Feeling of Stress : Not at all  Social Connections: Not on file    Current Medications:  Current Outpatient Medications:    acetaminophen (TYLENOL) 500 MG tablet, Take 1,000 mg by mouth in the morning and at bedtime., Disp: , Rfl:    estradiol (ESTRACE) 0.1 MG/GM vaginal cream, 0.5 g of cream intravaginally administered daily for 2 weeks, then reduce to twice weekly, Disp: 42.5 g, Rfl: 12   folic acid (FOLVITE) 1 MG tablet, Take 2 mg by mouth daily., Disp: , Rfl:    hydroxychloroquine (PLAQUENIL) 200 MG tablet, Take 200 mg by mouth 2 (two) times daily., Disp: , Rfl:    losartan (COZAAR) 25 MG tablet, Take 1 tablet by mouth once daily, Disp: 90 tablet, Rfl: 0    predniSONE (DELTASONE) 5 MG tablet, Take by mouth., Disp: , Rfl:    Vitamin D, Cholecalciferol, 25 MCG (1000 UT) TABS, Take 1 tablet by mouth 2 (two) times daily., Disp: , Rfl:    XARELTO 20 MG TABS tablet, TAKE 1 TABLET BY MOUTH  DAILY WITH SUPPER, Disp: 90 tablet, Rfl: 3  Review of Symptoms: Complete 10-system review is negative except  as above in Interval History.  Physical Exam: BP (!) 149/67 (BP Location: Left Wrist, Patient Position: Sitting)   Pulse 82   Temp 98.5 F (36.9 C)   Ht 5' 2.99" (1.6 m)   Wt 224 lb (101.6 kg)   SpO2 98%   BMI 39.69 kg/m  General: Alert, oriented, no acute distress. HEENT: Normocephalic, atraumatic. Neck symmetric without masses. Chest: Normal work of breathing.  Extremities: Grossly normal range of motion.  Warm, well perfused.    Laboratory & Radiologic Studies: FINAL MICROSCOPIC DIAGNOSIS:  A. PERIANAL, LEFT, BIOPSY: Extramammary Paget's disease. See comment.  B. LABIAL MARGIN, LEFT MID, BIOPSY: Extramammary Paget's disease. See comment.  C. PERICLITORAL, LEFT ANTERIOR, BIOPSY: Extramammary Paget's disease. See comment.  D. LEFT POSTERIOR MARGIN, BIOPSY: Extramammary Paget's disease. See comment.  E. PERIANAL, RIGHT, BIOPSY: Extramammary Paget's disease. See comment.  F. RIGHT LABIAL MARGIN, BIOPSY: Extramammary Paget's disease. See comment.  COMMENT: All of the biopsies show vacuolated intraepidermal cells in a pagetoid pattern with atypia consistent with extramammary Paget's disease.  The previous vulvar biopsies from 02/23/2023 ([id number], [facility]) also showed extramammary Paget's disease and immunohistochemistry performed on the 02/23/2023 biopsies supports the diagnosis.  Urine cytology: FINAL MICROSCOPIC DIAGNOSIS:  - Negative for high grade urothelial carcinoma   SPECIMEN ADEQUACY:  Satisfactory for evaluation   US PELVIC COMPLETE WITH TRANSVAGINAL 03/29/2023  Narrative CLINICAL  DATA:  Reported history of Paget's disease.  EXAM: TRANSABDOMINAL AND TRANSVAGINAL ULTRASOUND OF PELVIS  TECHNIQUE: Both transabdominal and transvaginal ultrasound examinations of the pelvis were performed. Transabdominal technique was performed for global imaging of the pelvis including uterus, ovaries, adnexal regions, and pelvic cul-de-sac. It was necessary to proceed with endovaginal exam following the transabdominal exam to visualize the adnexa and uterus.  COMPARISON:  CT abdomen and pelvis dated 10/31/2020 and pelvic ultrasound dated 05/06/2020.  FINDINGS: The exam is limited due to patient body habitus and advanced age.  Uterus  Measurements: 7.8 x 3.6 x 4.4 cm = volume: 65 mL. No fibroids or other mass visualized.  Endometrium  Not well visualized.  Right ovary  Not visualized.  Left ovary  Not visualized.  Other findings  No abnormal free fluid.  IMPRESSION: Limited exam with nonvisualization of the ovaries. No suspicious finding is identified.   Electronically Signed By: Romona Curls M.D. On: 03/30/2023 11:45

## 2023-04-05 NOTE — Patient Instructions (Signed)
It was a pleasure to see you in clinic today. - Surgery planned is a wide local excision of the vulva (total vulvectomy including removal of clitoris) and joint procedure with plastic surgery for complex closure - Return visit planned for postop.  Thank you very much for allowing me to provide care for you today.  I appreciate your confidence in choosing our Gynecologic Oncology team at [facility].  If you have any questions about your visit today please call our office or send [place] a MyChart message and we will get back to you as soon as possible.

## 2023-04-06 ENCOUNTER — Telehealth: Payer: Self-pay | Admitting: *Deleted

## 2023-04-06 ENCOUNTER — Encounter: Payer: Self-pay | Admitting: Psychiatry

## 2023-04-06 NOTE — Telephone Encounter (Signed)
Patient checking on appointment for surgical clearance. Surgery scheduled 04/29/2023. Patient phone number is [phone] and [phone].

## 2023-04-06 NOTE — Telephone Encounter (Signed)
Per Dr Alvester Morin fax records to [facility]

## 2023-04-06 NOTE — Telephone Encounter (Signed)
Okay for her to see nurse practitioner or Dr Isaiah Serge for pre op cleraance

## 2023-04-06 NOTE — Telephone Encounter (Signed)
   Name: [name]  DOB: 1940-12-04  MRN: [id number]  Primary Cardiologist: Charlton Haws, MD  Chart reviewed as part of pre-operative protocol coverage. The patient has an upcoming visit scheduled with Tereso Newcomer, PA on 04/16/2023 at which time clearance can be addressed in case there are any issues that would impact surgical recommendations. I added preop FYI to appointment note so that provider is aware to address at time of outpatient visit.  Per office protocol the cardiology provider should forward their finalized clearance decision and recommendations regarding antiplatelet therapy to the requesting party below.    Patient is on Xarelto for history of PE and currently is not managed by cardiology.  I will route this message as FYI to requesting party and remove this message from the preop box as separate preop APP input not needed at this time.   Please call with any questions.  Napoleon Form, Leodis Rains, NP  04/06/2023, 1:20 PM

## 2023-04-06 NOTE — Telephone Encounter (Signed)
   Pre-operative Risk Assessment    Patient Name: [name]  DOB: 02-28-1941 MRN: [id number]    PT IS GOING TO NEED A NEW PT APPT, PT LAST SEEN 2020  Request for Surgical Clearance    Procedure:   TOTAL VULVECTOMY WITH JOINT PLASTIC SURGEON FOR COMPLEX CLOSURE   Date of Surgery:  Clearance 04/29/23                                 Surgeon:  DR. Clide Cliff Surgeon's Group or Practice Name:  [facility] Phone number:  [phone] Fax number:  [phone]   Type of Clearance Requested:   - Medical  - Pharmacy:  Hold Rivaroxaban (Xarelto)     Type of Anesthesia:  General    Additional requests/questions:    Elpidio Anis   04/06/2023, 1:03 PM

## 2023-04-06 NOTE — Telephone Encounter (Signed)
Per Dr Alvester Morin fax records and surgical optimization form to the patient's cardiology, pulmonary and medical oncology offices

## 2023-04-06 NOTE — Telephone Encounter (Signed)
-----   Message from Wendall Stade, MD sent at 04/06/2023  8:42 AM EDT ----- Elita Quick can you get her in with PA/NP would order echo and BMET/BNP ----- Message ----- From: Clide Cliff, MD Sent: 04/05/2023   6:01 PM EDT To: Wendall Stade, MD  Appreciate your quick response. She does have new orthopnea (she is switching from bed to recliner at night for mild SOB), so am wondering if you think it would be worth her getting a return visit with you.  Let me know. Thanks for your thoughts.  Sincerely, Sharyl Nimrod  ----- Message ----- From: Wendall Stade, MD Sent: 04/05/2023   5:28 PM EDT To: Clide Cliff, MD  She has not had clinical CAD with normal EF by echo We have not seen her in 3 years but if no new symptoms she should be ok for surgery. Her ECG is always abnormal with LVH/strain and would not be alarmed by this  ----- Message ----- From: Clide Cliff, MD Sent: 04/05/2023   4:47 PM EDT To: Wendall Stade, MD

## 2023-04-06 NOTE — Telephone Encounter (Signed)
Spoke with Ms. [name] after patient left a message for the office in regards to her Cardiology office requesting [name] to send them pre-authorization to fax # [phone]. (This currently has been done) Patient also requesting information on how to set up a [facility] MyChart account. Advised patient that they will give her this information at her pre-op appt. And patient stated that she did receive paper work from them yesterday and would check.    Patient also states she has an appointment scheduled with her cardiologist on July 12th and an echocardiogram  scheduled on July 15 th. Patient thanked the office for calling and had no further concerns or questions.

## 2023-04-06 NOTE — Telephone Encounter (Signed)
Dr. Marchelle Gearing- please advise on pt email. We need to get her worked in before surgery 04/29/23 or sounds like she will need to reschedule it. Thanks!   Hi Dr. Marchelle Gearing.  I have been diagnosed with Extramammary  Paget's disease.  I am scheduled for surgery at [facility] on 04/29/23.  My GYN oncologist, Dr. Clide Cliff is requesting a pre-op appointment with you.  I will call the office in the morning and Dr. Lima Blas team is going to follow-up, too.  Thank-you.

## 2023-04-07 ENCOUNTER — Telehealth: Payer: Self-pay

## 2023-04-07 NOTE — Telephone Encounter (Signed)
Called Pt regarding surgical clearance for 04/29/23 vulvectomy. Per MD Pt is to stop xarelto 2 days before surgery and restart 2 days after surgery. Pt verbalized understanding.

## 2023-04-09 ENCOUNTER — Other Ambulatory Visit: Payer: Self-pay | Admitting: Urology

## 2023-04-12 ENCOUNTER — Encounter (HOSPITAL_BASED_OUTPATIENT_CLINIC_OR_DEPARTMENT_OTHER): Payer: Self-pay | Admitting: Urology

## 2023-04-13 ENCOUNTER — Encounter: Payer: Self-pay | Admitting: Psychiatry

## 2023-04-13 NOTE — H&P (Signed)
CC/HPI: cc: Urinary urgency/frequency/urge incontinence   12/02/2022: 82 year old woman comes in today with her daughter for worsening urinary urgency, frequency and urge incontinence. She also has nighttime urination with leakage. She has tried Information systems manager for several months which did not improve symptoms. She is also experiencing vaginal itching and was given vaginal estradiol cream to use. She is not using this regularly. She denies any gross hematuria. She has had 2 UTIs in the past approximately 15 months.    Overactive bladder body question screener: 2, 2, 2, 5, 5, 5, 5, 5=31/40   04/07/23: 83 year old woman with a history of mixed urinary incontinence here for follow-up after urodynamic study. In the interim patient has been diagnosed with Paget's of the vulva and is scheduled for an extensive vulvectomy with [facility] oncology and plastic surgery. Urodynamic study showed fairly significant stress and urge urinary incontinence. Her bladder capacity was approximately 110 cc. She is scheduled for surgery at the end of July with [name].   UDS SUMMARY  Mr. Mackowiak held a max capacity of approx.108 mls. Her 1st sensation was felt at 98 mls. There was positive SUI. She leaked with both coughing and Valsalva. Please see information above. There was positive instability. Multiple unstable contractions noted on the study. She felt an increased urgency and leaked mild to moderately from this instability. Rectal spasms were noted on the study. She was able to generate a voluntary contraction and void 92 mls with max flow of 8 ml/s. Max detrusor pressure while voiding was 19 cmH20. Straining was noted. EMG leads were basically quiet during the voiding phase. PVR was approx. 16 ml. No trabeculation was noted. No reflux was seen. She will return for UDS follow up.     ALLERGIES: Codeine    MEDICATIONS: Folic Acid  Hydroxychloroquine Sulfate 200 mg tablet  Losartan Potassium 25 mg tablet  Tylenol  Vitamin  D2  Xarelto 20 mg tablet     GU PSH: Complex cystometrogram, w/ void pressure and urethral pressure profile studies, any technique - 03/26/2023 Complex Uroflow - 03/26/2023 Emg surf Electrd - 03/26/2023 Inject For cystogram - 03/26/2023 Intrabd voidng Press - 03/26/2023       PSH Notes: shoulder surgery, knee surgery, wrist surgery   NON-GU PSH: Neuroeltrd Stim Post Tibial - 02/16/2023, 02/09/2023, 02/02/2023, 01/26/2023, 01/19/2023, 01/12/2023, 01/05/2023, 12/29/2022 Visit Complexity (formerly GPC1X) - 12/02/2022     GU PMH: Urge incontinence - 03/26/2023, - 02/16/2023, - 02/09/2023, - 02/02/2023, - 01/26/2023, - 01/19/2023, - 01/12/2023, - 01/05/2023, - 12/29/2022, - 12/02/2022 Urinary Urgency - 03/26/2023, - 02/16/2023, - 02/09/2023, - 02/02/2023, - 01/26/2023, - 01/19/2023, - 01/12/2023, - 01/05/2023, - 12/29/2022, - 12/02/2022 Nocturia - 12/02/2022 Postmenopausal atrophic vaginitis - 12/02/2022    NON-GU PMH: Arthritis Breast Cancer, History DVT, History Hypercholesterolemia Hypertension    FAMILY HISTORY: 2 sons - Son 3 daughters - Daughter   SOCIAL HISTORY: Marital Status: Married Preferred Language: English; Ethnicity: Not Hispanic Or Latino; Race: White Current Smoking Status: Patient does not smoke anymore.   Tobacco Use Assessment Completed: Used Tobacco in last 30 days? Has never drank.  Drinks 3 caffeinated drinks per day.    REVIEW OF SYSTEMS:    GU Review Female:   Patient denies frequent urination, hard to postpone urination, burning /pain with urination, get up at night to urinate, leakage of urine, stream starts and stops, trouble starting your stream, have to strain to urinate, and being pregnant.  Gastrointestinal (Upper):   Patient denies nausea, vomiting, and indigestion/ heartburn.  Gastrointestinal (Lower):   Patient denies diarrhea and constipation.  Constitutional:   Patient denies fever, night sweats, weight loss, and fatigue.  Skin:   Patient denies itching and skin rash/ lesion.   Eyes:   Patient denies blurred vision and double vision.  Ears/ Nose/ Throat:   Patient denies sore throat and sinus problems.  Hematologic/Lymphatic:   Patient denies swollen glands and easy bruising.  Cardiovascular:   Patient denies leg swelling and chest pains.  Respiratory:   Patient denies cough and shortness of breath.  Endocrine:   Patient denies excessive thirst.  Musculoskeletal:   Patient denies back pain and joint pain.  Neurological:   Patient denies headaches and dizziness.  Psychologic:   Patient denies depression and anxiety.   VITAL SIGNS: None   MULTI-SYSTEM PHYSICAL EXAMINATION:    Constitutional: Well-nourished. No physical deformities. Normally developed. Good grooming.  Neck: Neck symmetrical, not swollen. Normal tracheal position.  Respiratory: No labored breathing, no use of accessory muscles.   Skin: No paleness, no jaundice, no cyanosis. No lesion, no ulcer, no rash.  Neurologic / Psychiatric: Oriented to time, oriented to place, oriented to person. No depression, no anxiety, no agitation.  Eyes: Normal conjunctivae. Normal eyelids.  Ears, Nose, Mouth, and Throat: Left ear no scars, no lesions, no masses. Right ear no scars, no lesions, no masses. Nose no scars, no lesions, no masses. Normal hearing. Normal lips.  Musculoskeletal: Normal gait and station of head and neck.     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, Stable   PLAN:           Document Letter(s):  Created for Patient: Clinical Summary         Notes:   Mixed urinary incontinence:  -Reviewed urodynamic study which showed both stress and urge urinary incontinence with small capacity bladder  -We discussed moving forward with Bulkamid to treat stress urinary continence and Botox to treat urge urinary incontinence  -She understands that she may need a second Bulkamid procedure and that an indwelling Foley catheter following vulvectomy may flatten the cushions  -We also discussed the retention rate with Botox of 3 to 8% and need for indwelling Foley catheter  -Her goals include reducing leakage so that she can heal from vulvectomy  -Risks and benefits of the procedure were discussed with the patient detail including but not limited to pain, bleeding, infection, need for additional treatment, urinary retention   Will try and schedule prior to patient's vulvectomy

## 2023-04-14 NOTE — Progress Notes (Signed)
Surgical clearance for 04/20/23 cystoscopy with botox and bulkamid injections faxed to [facility] [phone] with receipt confirmation.

## 2023-04-15 ENCOUNTER — Encounter: Payer: Self-pay | Admitting: Physician Assistant

## 2023-04-15 ENCOUNTER — Encounter: Payer: Self-pay | Admitting: Hematology and Oncology

## 2023-04-15 DIAGNOSIS — I2510 Atherosclerotic heart disease of native coronary artery without angina pectoris: Secondary | ICD-10-CM | POA: Insufficient documentation

## 2023-04-15 DIAGNOSIS — Z01810 Encounter for preprocedural cardiovascular examination: Secondary | ICD-10-CM | POA: Insufficient documentation

## 2023-04-15 HISTORY — DX: Atherosclerotic heart disease of native coronary artery without angina pectoris: I25.10

## 2023-04-15 NOTE — Progress Notes (Addendum)
Cardiology Office Note:    Date:  04/16/2023  ID:  [name], DOB 1941/08/24, MRN [id number] PCP: Eden Emms, NP  [facility] Providers Cardiologist:  Charlton Haws, MD       Patient Profile:      Coronary artery calcification (CT 10/2020) Myoview 09/15/2019: EF 61, no ischemia, low risk TTE 12/09/2020: EF 60-65, no RWMA, GR 1 DD, GLS -22.1, normal RVSF, trivial MR, trivial AI, AV sclerosis, RAP 3 Supraventricular Tachycardia  Abnormal ECG (dating back to 2013 - strain pattern) Hypertension Hyperlipidemia Statin intolerant Diabetes mellitus  History of pulmonary embolism Antiphospholipid antibody syndrome Breast CA Pulmonary fibrosis Rheumatoid arthritis      History of Present Illness:   [name] is a 82 y.o. female returns for surgical clearance.  She was last seen by Dr. Eden Emms via telemedicine in 03/2020.  She needs GYN surgery due to Paget's disease of the vulva.  Surgery is scheduled for 04/20/2023.  She needs her Xarelto held.  However, this is not managed by cardiology.  She is here with her daughter.  Her surgery scheduled at [facility] the first week of August.  She is limited in activity by rheumatoid arthritis.  Recently, she has noted left arm numbness.  This happens at rest and with activities.  She also notes chest discomfort on the left when she lays down at night.  She has to sit up.  She has not had orthopnea, edema, syncope.  Review of Systems  Gastrointestinal:  Negative for hematochezia and melena.       Studies Reviewed:   EKG Interpretation Date/Time:  Friday April 16 2023 07:37:55 EDT Ventricular Rate:  71 PR Interval:  170 QRS Duration:  82 QT Interval:  392 QTC Calculation: 425 R Axis:   14  Text Interpretation: Normal sinus rhythm Normal axis TW inversions 1, aVL, V5-6 No change when compared to multiple old ECGs Confirmed by Tereso Newcomer [phone]) on 04/16/2023 7:57:58 AM   Risk Assessment/Calculations:           Physical  Exam:   VS:  BP 126/60   Pulse 71   Ht 5' 2.99" (1.6 m)   Wt 222 lb 3.2 oz (100.8 kg)   SpO2 94%   BMI 39.37 kg/m    Wt Readings from Last 3 Encounters:  04/16/23 222 lb 3.2 oz (100.8 kg)  04/05/23 224 lb (101.6 kg)  03/22/23 224 lb 9.6 oz (101.9 kg)    Constitutional:      Appearance: Healthy appearance. Not in distress.  Neck:     Vascular: JVD normal.  Pulmonary:     Breath sounds: Normal breath sounds. No wheezing. No rales.  Cardiovascular:     Normal rate. Regular rhythm.     Murmurs: There is no murmur.  Edema:    Peripheral edema absent.  Abdominal:     Palpations: Abdomen is soft.      ASSESSMENT AND PLAN:   Preoperative cardiovascular examination [name]'s perioperative risk of a major cardiac event is 0.4% according to the Revised Cardiac Risk Index (RCRI).  Therefore, she is at low risk for perioperative complications.   Her functional capacity is poor at 3.63 METs according to the Duke Activity Status Index (DASI). She has chronic shortness of breath and questionable orthopnea. Dr. Eden Emms has already requested an echocardiogram which is scheduled for Monday 04/19/2023.  She also notes chest discomfort on the left at night as well as left arm numbness/discomfort. Recommendations: Await results of  echocardiogram. Arrange Lexiscan Myoview as well to rule out ischemia as a cause for her L arm pain, chest pain. Further recommendations to follow. Anticoagulation recommendations per Oncologist.  Coronary artery calcification seen on CT scan As noted, she has had some left arm symptoms as well as chest discomfort.  She has a chronically abnormal EKG.  This is unchanged.  Her activity is limited due to rheumatoid arthritis.  Echocardiogram has already been scheduled.  We will also arrange Lexiscan Myoview to rule out ischemia.  She is not on antiplatelet therapy as she is on Xarelto.  She is statin intolerant.  Plan follow-up in 1 year unless stress test  abnormal.  Essential hypertension, benign Blood pressure controlled.  Continue losartan 25 mg daily.  PSVT (paroxysmal supraventricular tachycardia) (HCC) Symptoms overall quiescent.  Hyperlipidemia Patient intolerant of statins.  She does not want to try any further medications.  History of pulmonary embolism Anticoagulation per oncology.  SOB (shortness of breath) As noted, echocardiogram is pending. Obtain BMET, BNP today.     ADDENDUM: Nuclear stress test 04/19/2023: Normal perfusion, EF 64, low risk TTE 04/19/2023: EF 55, mild LVH, normal RVSF, normal PASP, trivial MR, trivial AI, RAP 3 Therefore, the patient may proceed with her surgery at acceptable CV risk.  Informed Consent   Shared Decision Making/Informed Consent The risks [chest pain, shortness of breath, cardiac arrhythmias, dizziness, blood pressure fluctuations, myocardial infarction, stroke/transient ischemic attack, nausea, vomiting, allergic reaction, radiation exposure, metallic taste sensation and life-threatening complications (estimated to be 1 in 10,000)], benefits (risk stratification, diagnosing coronary artery disease, treatment guidance) and alternatives of a nuclear stress test were discussed in detail with [name] and she agrees to proceed.     Dispo:  Return in about 1 year (around 04/15/2024) for Routine Follow Up with Dr. Eden Emms.  Signed, Tereso Newcomer, PA-C

## 2023-04-16 ENCOUNTER — Encounter: Payer: Self-pay | Admitting: Physician Assistant

## 2023-04-16 ENCOUNTER — Ambulatory Visit: Payer: Medicare Other | Attending: Physician Assistant | Admitting: Physician Assistant

## 2023-04-16 ENCOUNTER — Other Ambulatory Visit: Payer: Self-pay

## 2023-04-16 ENCOUNTER — Encounter (HOSPITAL_BASED_OUTPATIENT_CLINIC_OR_DEPARTMENT_OTHER): Payer: Self-pay | Admitting: Urology

## 2023-04-16 ENCOUNTER — Ambulatory Visit: Payer: Medicare Other

## 2023-04-16 VITALS — BP 126/60 | HR 71 | Ht 62.99 in | Wt 222.2 lb

## 2023-04-16 DIAGNOSIS — I2510 Atherosclerotic heart disease of native coronary artery without angina pectoris: Secondary | ICD-10-CM | POA: Diagnosis not present

## 2023-04-16 DIAGNOSIS — Z01810 Encounter for preprocedural cardiovascular examination: Secondary | ICD-10-CM

## 2023-04-16 DIAGNOSIS — E7800 Pure hypercholesterolemia, unspecified: Secondary | ICD-10-CM

## 2023-04-16 DIAGNOSIS — I10 Essential (primary) hypertension: Secondary | ICD-10-CM | POA: Diagnosis not present

## 2023-04-16 DIAGNOSIS — R079 Chest pain, unspecified: Secondary | ICD-10-CM | POA: Diagnosis not present

## 2023-04-16 DIAGNOSIS — I471 Supraventricular tachycardia, unspecified: Secondary | ICD-10-CM

## 2023-04-16 DIAGNOSIS — R0602 Shortness of breath: Secondary | ICD-10-CM | POA: Insufficient documentation

## 2023-04-16 DIAGNOSIS — Z86711 Personal history of pulmonary embolism: Secondary | ICD-10-CM

## 2023-04-16 LAB — BASIC METABOLIC PANEL
BUN/Creatinine Ratio: 22 (ref 12–28)
BUN: 17 mg/dL (ref 8–27)
CO2: 25 mmol/L (ref 20–29)
Calcium: 9.3 mg/dL (ref 8.7–10.3)
Creatinine, Ser: 0.78 mg/dL (ref 0.57–1.00)
Glucose: 100 mg/dL — ABNORMAL HIGH (ref 70–99)
Sodium: 142 mmol/L (ref 134–144)

## 2023-04-16 LAB — PRO B NATRIURETIC PEPTIDE

## 2023-04-16 NOTE — Assessment & Plan Note (Signed)
Symptoms overall quiescent.

## 2023-04-16 NOTE — Assessment & Plan Note (Signed)
Anticoagulation per oncology.

## 2023-04-16 NOTE — Assessment & Plan Note (Addendum)
[name] perioperative risk of a major cardiac event is 0.4% according to the Revised Cardiac Risk Index (RCRI).  Therefore, she is at low risk for perioperative complications.   Her functional capacity is poor at 3.63 METs according to the Duke Activity Status Index (DASI). She has chronic shortness of breath and questionable orthopnea. Dr. Eden Emms has already requested an echocardiogram which is scheduled for Monday 04/19/2023.  She also notes chest discomfort on the left at night as well as left arm numbness/discomfort. Recommendations: Await results of echocardiogram. Arrange Lexiscan Myoview as well to rule out ischemia as a cause for her L arm pain, chest pain. Further recommendations to follow. Anticoagulation recommendations per Oncologist.

## 2023-04-16 NOTE — Assessment & Plan Note (Signed)
Patient intolerant of statins.  She does not want to try any further medications.

## 2023-04-16 NOTE — Assessment & Plan Note (Signed)
As noted, she has had some left arm symptoms as well as chest discomfort.  She has a chronically abnormal EKG.  This is unchanged.  Her activity is limited due to rheumatoid arthritis.  Echocardiogram has already been scheduled.  We will also arrange Lexiscan Myoview to rule out ischemia.  She is not on antiplatelet therapy as she is on Xarelto.  She is statin intolerant.  Plan follow-up in 1 year unless stress test abnormal.

## 2023-04-16 NOTE — Patient Instructions (Addendum)
Medication Instructions:  Your physician recommends that you continue on your current medications as directed. Please refer to the Current Medication list given to you today.  *If you need a refill on your cardiac medications before your next appointment, please call your pharmacy*   Lab Work: TODAY:  BMET & PRO BNP  If you have labs (blood work) drawn today and your tests are completely normal, you will receive your results only by: MyChart Message (if you have MyChart) OR A paper copy in the mail If you have any lab test that is abnormal or we need to change your treatment, we will call you to review the results.   Testing/Procedures: Your physician has requested that you have a lexiscan myoview. For further information please visit [email or web address] Please follow instruction sheet, BELOW:    You are scheduled for a Myocardial Perfusion Imaging Study  04/19/2023 ARRIVING AT 12:15 Please arrive 15 minutes prior to your appointment time for registration and insurance purposes.  The test will take approximately 3 to 4 hours to complete; you may bring reading material.  If someone comes with you to your appointment, they will need to remain in the main lobby due to limited space in the testing area. **If you are pregnant or breastfeeding, please notify the nuclear lab prior to your appointment**  How to prepare for your Myocardial Perfusion Test: Do not eat or drink 3 hours prior to your test, except you may have water. Do not consume products containing caffeine (regular or decaffeinated) 12 hours prior to your test. (ex: coffee, chocolate, sodas, tea). Do bring a list of your current medications with you.  If not listed below, you may take your medications as normal. Do wear comfortable clothes (no dresses or overalls) and walking shoes, tennis shoes preferred (No heels or open toe shoes are allowed). Do NOT wear cologne, perfume, aftershave, or lotions (deodorant is allowed). If  these instructions are not followed, your test will have to be rescheduled.      Follow-Up: At [facility], you and your health needs are our priority.  As part of our continuing mission to provide you with exceptional heart care, we have created designated Provider Care Teams.  These Care Teams include your primary Cardiologist (physician) and Advanced Practice Providers (APPs -  Physician Assistants and Nurse Practitioners) who all work together to provide you with the care you need, when you need it.  We recommend signing up for the patient portal called "MyChart".  Sign up information is provided on this After Visit Summary.  MyChart is used to connect with patients for Virtual Visits (Telemedicine).  Patients are able to view lab/test results, encounter notes, upcoming appointments, etc.  Non-urgent messages can be sent to your provider as well.   To learn more about what you can do with MyChart, go to [email or web address].    Your next appointment:   1 year(s)  Provider:   Charlton Haws, MD     Other Instructions

## 2023-04-16 NOTE — Assessment & Plan Note (Signed)
Blood pressure controlled.  Continue losartan 25 mg daily.

## 2023-04-16 NOTE — Progress Notes (Addendum)
Spoke w/ via phone for pre-op interview---pt Lab needs dos----I stat               Lab results------EKG 04-15-2013 epic, lov pulmonary elizabeth white NP 10-29-2022 epic, echo 12-09-2020 epic, stress tesr 09-15-2019 epic lov cardiology [facility] pa 04-16-2023 epic, cardiologist dr Charlton Haws COVID test -----patient states asymptomatic no test needed Arrive at -------1015 04-20-2023  NPO after MN NO Solid Food.  Clear liquids from MN until---915 Med rec completed Medications to take morning of surgery -----none Diabetic medication -----n/a Patient instructed no nail polish to be worn day of surgery Patient instructed to bring photo id and insurance card day of surgery Patient aware to have Driver (ride ) / caregiver  daughter jennifer   for 24 hours after surgery  Patient Special Instructions -----none Pre-Op special Instructions -----none Patient verbalized understanding of instructions that were given at this phone interview. Patient denies shortness of breath, chest pain, fever, cough at this phone interview.  Pt states she was told by dr Pamelia Hoit to stop xarelto 72 hours before surgery, note from dr Pamelia Hoit to stop xarelto 72 hours before surgery on chart for 04-20-2023 surgery.  Reviewed patient history and echo and stress test scheduled for 04-19-2023 with dr Tacy Dura mda, per dr oddono anesthesia is to assess patient day of surgery on 04-20-2023.

## 2023-04-16 NOTE — Assessment & Plan Note (Signed)
As noted, echocardiogram is pending. Obtain BMET, BNP today.

## 2023-04-17 LAB — BASIC METABOLIC PANEL
Chloride: 102 mmol/L (ref 96–106)
Potassium: 4.6 mmol/L (ref 3.5–5.2)
eGFR: 75 mL/min/{1.73_m2} (ref >59)

## 2023-04-19 ENCOUNTER — Ambulatory Visit (HOSPITAL_COMMUNITY): Payer: Medicare Other | Attending: Cardiology

## 2023-04-19 ENCOUNTER — Ambulatory Visit (HOSPITAL_BASED_OUTPATIENT_CLINIC_OR_DEPARTMENT_OTHER): Payer: Medicare Other

## 2023-04-19 DIAGNOSIS — C4499 Other specified malignant neoplasm of skin, unspecified: Secondary | ICD-10-CM

## 2023-04-19 DIAGNOSIS — R079 Chest pain, unspecified: Secondary | ICD-10-CM | POA: Insufficient documentation

## 2023-04-19 DIAGNOSIS — I2510 Atherosclerotic heart disease of native coronary artery without angina pectoris: Secondary | ICD-10-CM

## 2023-04-19 DIAGNOSIS — Z01810 Encounter for preprocedural cardiovascular examination: Secondary | ICD-10-CM | POA: Diagnosis not present

## 2023-04-19 LAB — MYOCARDIAL PERFUSION IMAGING
LV dias vol: 70 mL (ref 46–106)
LV sys vol: 25 mL
Nuc Stress EF: 64 %
Peak HR: 91 {beats}/min
Rest HR: 81 {beats}/min
Rest Nuclear Isotope Dose: 10.1 mCi
SDS: 0
SRS: 2
SSS: 3
ST Depression (mm): 0 mm
Stress Nuclear Isotope Dose: 32.7 mCi
TID: 1.05

## 2023-04-19 LAB — ECHOCARDIOGRAM COMPLETE
Area-P 1/2: 4.29 cm2
Height: 63 in
P 1/2 time: 525 msec
S' Lateral: 3.6 cm
Weight: 3552 oz

## 2023-04-19 MED ORDER — TECHNETIUM TC 99M TETROFOSMIN IV KIT: 32.7000 | PACK | Freq: Once | INTRAVENOUS | Status: AC | PRN

## 2023-04-19 MED ORDER — TECHNETIUM TC 99M TETROFOSMIN IV KIT
10.1000 | PACK | Freq: Once | INTRAVENOUS | Status: AC | PRN
  Administered 2023-04-19: 10.1 via INTRAVENOUS

## 2023-04-19 MED ORDER — REGADENOSON 0.4 MG/5ML IV SOLN: 0.4000 mg | Freq: Once | INTRAVENOUS | Status: AC

## 2023-04-19 NOTE — Anesthesia Preprocedure Evaluation (Signed)
Anesthesia Evaluation  Patient identified by MRN, date of birth, ID band Patient awake    Reviewed: Allergy & Precautions, NPO status , Patient's Chart, lab work & pertinent test results  Airway Mallampati: II  TM Distance: >3 FB Neck ROM: Full    Dental no notable dental hx. (+) Teeth Intact, Implants, Dental Advisory Given   Pulmonary shortness of breath, former smoker   Pulmonary exam normal breath sounds clear to auscultation       Cardiovascular hypertension, + CAD and + DVT  Normal cardiovascular exam+ dysrhythmias  Rhythm:Regular Rate:Normal  04/19/2023 Lexiscan  Nuclear stress EF: 64%. The left ventricular ejection fraction is normal (55-65%).   Prior study available for comparison from 09/15/2019.    Neuro/Psych  Headaches  Anxiety        GI/Hepatic ,GERD  ,,  Endo/Other  negative endocrine ROS    Renal/GU      Musculoskeletal  (+) Arthritis ,    Abdominal  (+) + obese  Peds  Hematology   Anesthesia Other Findings All: Codeine, Hydrocodone  Breast CA  Reproductive/Obstetrics                             Anesthesia Physical Anesthesia Plan  ASA: 3  Anesthesia Plan: General   Post-op Pain Management: Precedex and Ofirmev IV (intra-op)*   Induction:   PONV Risk Score and Plan: Treatment may vary due to age or medical condition and Ondansetron  Airway Management Planned: LMA  Additional Equipment: None  Intra-op Plan:   Post-operative Plan: Extubation in OR  Informed Consent: I have reviewed the patients History and Physical, chart, labs and discussed the procedure including the risks, benefits and alternatives for the proposed anesthesia with the patient or authorized representative who has indicated his/her understanding and acceptance.     Dental advisory given  Plan Discussed with:   Anesthesia Plan Comments:         Anesthesia Quick Evaluation

## 2023-04-20 ENCOUNTER — Ambulatory Visit (HOSPITAL_BASED_OUTPATIENT_CLINIC_OR_DEPARTMENT_OTHER): Payer: Medicare Other | Admitting: Anesthesiology

## 2023-04-20 ENCOUNTER — Encounter (HOSPITAL_BASED_OUTPATIENT_CLINIC_OR_DEPARTMENT_OTHER)
Admission: RE | Disposition: A | Discharge status: Home / Self Care | Payer: Self-pay | Source: Ambulatory Visit | Attending: Urology

## 2023-04-20 ENCOUNTER — Telehealth: Payer: Medicare Other | Admitting: Physician Assistant

## 2023-04-20 ENCOUNTER — Ambulatory Visit (HOSPITAL_BASED_OUTPATIENT_CLINIC_OR_DEPARTMENT_OTHER)
Admission: RE | Admit: 2023-04-20 | Discharge: 2023-04-20 | Disposition: A | Discharge status: Home / Self Care | Payer: Medicare Other | Source: Ambulatory Visit | Attending: Urology | Admitting: Urology

## 2023-04-20 ENCOUNTER — Encounter (HOSPITAL_BASED_OUTPATIENT_CLINIC_OR_DEPARTMENT_OTHER): Payer: Self-pay | Admitting: Urology

## 2023-04-20 ENCOUNTER — Encounter: Payer: Self-pay | Admitting: Nurse Practitioner

## 2023-04-20 ENCOUNTER — Telehealth: Payer: Self-pay | Admitting: Nurse Practitioner

## 2023-04-20 DIAGNOSIS — I2510 Atherosclerotic heart disease of native coronary artery without angina pectoris: Secondary | ICD-10-CM

## 2023-04-20 DIAGNOSIS — N3946 Mixed incontinence: Secondary | ICD-10-CM

## 2023-04-20 DIAGNOSIS — N3281 Overactive bladder: Secondary | ICD-10-CM | POA: Insufficient documentation

## 2023-04-20 DIAGNOSIS — B029 Zoster without complications: Secondary | ICD-10-CM

## 2023-04-20 DIAGNOSIS — Z87440 Personal history of urinary (tract) infections: Secondary | ICD-10-CM | POA: Diagnosis not present

## 2023-04-20 DIAGNOSIS — Z87891 Personal history of nicotine dependence: Secondary | ICD-10-CM | POA: Diagnosis not present

## 2023-04-20 DIAGNOSIS — I10 Essential (primary) hypertension: Secondary | ICD-10-CM | POA: Diagnosis not present

## 2023-04-20 DIAGNOSIS — N952 Postmenopausal atrophic vaginitis: Secondary | ICD-10-CM | POA: Insufficient documentation

## 2023-04-20 DIAGNOSIS — Z01818 Encounter for other preprocedural examination: Secondary | ICD-10-CM

## 2023-04-20 HISTORY — PX: CYSTOSCOPY WITH INJECTION: SHX1424

## 2023-04-20 HISTORY — PX: BOTOX INJECTION: SHX5754

## 2023-04-20 HISTORY — DX: Dependence on other enabling machines and devices: Z99.89

## 2023-04-20 HISTORY — DX: Interstitial pulmonary disease, unspecified: J84.9

## 2023-04-20 HISTORY — DX: Unspecified urinary incontinence: R32

## 2023-04-20 HISTORY — DX: Other chest pain: R07.89

## 2023-04-20 HISTORY — DX: Dyspnea, unspecified: R06.00

## 2023-04-20 HISTORY — DX: Anesthesia of skin: R20.0

## 2023-04-20 LAB — POCT I-STAT, CHEM 8
BUN: 14 mg/dL (ref 8–23)
Calcium, Ion: 1.28 mmol/L (ref 1.15–1.40)
Chloride: 104 mmol/L (ref 98–111)
Creatinine, Ser: 0.7 mg/dL (ref 0.44–1.00)
Glucose, Bld: 95 mg/dL (ref 70–99)
HCT: 41 % (ref 36.0–46.0)
Hemoglobin: 13.9 g/dL (ref 12.0–15.0)
Potassium: 3.9 mmol/L (ref 3.5–5.1)
Sodium: 141 mmol/L (ref 135–145)
TCO2: 25 mmol/L (ref 22–32)

## 2023-04-20 SURGERY — CYSTOSCOPY, WITH INJECTION OF BLADDER NECK OR BLADDER WALL
Anesthesia: General | Site: Urethra

## 2023-04-20 MED ORDER — DEXAMETHASONE SODIUM PHOSPHATE 10 MG/ML IJ SOLN
INTRAMUSCULAR | Status: AC
  Filled 2023-04-20: qty 1

## 2023-04-20 MED ORDER — ONDANSETRON HCL 4 MG/2ML IJ SOLN: 4.0000 mg | Freq: Once | INTRAMUSCULAR | Status: DC | PRN

## 2023-04-20 MED ORDER — ONDANSETRON HCL 4 MG/2ML IJ SOLN
INTRAMUSCULAR | Status: DC | PRN
Start: 2023-04-20 — End: 2023-04-20
  Administered 2023-04-20: 4 mg via INTRAVENOUS

## 2023-04-20 MED ORDER — DEXAMETHASONE SODIUM PHOSPHATE 10 MG/ML IJ SOLN
INTRAMUSCULAR | Status: DC | PRN
  Administered 2023-04-20: 5 mg via INTRAVENOUS

## 2023-04-20 MED ORDER — PROPOFOL 10 MG/ML IV BOLUS
INTRAVENOUS | Status: DC | PRN
Start: 2023-04-20 — End: 2023-04-20
  Administered 2023-04-20: 120 mg via INTRAVENOUS
  Administered 2023-04-20: 80 mg via INTRAVENOUS

## 2023-04-20 MED ORDER — FENTANYL CITRATE (PF) 100 MCG/2ML IJ SOLN
INTRAMUSCULAR | Status: DC | PRN
  Administered 2023-04-20: 50 ug via INTRAVENOUS

## 2023-04-20 MED ORDER — SODIUM CHLORIDE (PF) 0.9 % IJ SOLN
INTRAMUSCULAR | Status: DC | PRN
  Administered 2023-04-20: 20 mL

## 2023-04-20 MED ORDER — LIDOCAINE 2% (20 MG/ML) 5 ML SYRINGE
INTRAMUSCULAR | Status: DC | PRN
  Administered 2023-04-20: 100 mg via INTRAVENOUS

## 2023-04-20 MED ORDER — FENTANYL CITRATE (PF) 100 MCG/2ML IJ SOLN
INTRAMUSCULAR | Status: AC
  Filled 2023-04-20: qty 2

## 2023-04-20 MED ORDER — WATER FOR IRRIGATION, STERILE IR SOLN
Status: DC | PRN
  Administered 2023-04-20: 3000 mL via SURGICAL_CAVITY

## 2023-04-20 MED ORDER — CEFAZOLIN SODIUM-DEXTROSE 2-3 GM-%(50ML) IV SOLR
INTRAVENOUS | Status: DC | PRN
  Administered 2023-04-20: 2 g via INTRAVENOUS

## 2023-04-20 MED ORDER — ONDANSETRON HCL 4 MG/2ML IJ SOLN
INTRAMUSCULAR | Status: AC
  Filled 2023-04-20: qty 2

## 2023-04-20 MED ORDER — ACETAMINOPHEN 10 MG/ML IV SOLN: 1000.0000 mg | Freq: Once | INTRAVENOUS | Status: DC | PRN

## 2023-04-20 MED ORDER — ONABOTULINUMTOXINA 100 UNITS IJ SOLR
INTRAMUSCULAR | Status: DC | PRN
  Administered 2023-04-20: 100 [IU] via INTRAMUSCULAR

## 2023-04-20 MED ORDER — LIDOCAINE HCL (PF) 2 % IJ SOLN
INTRAMUSCULAR | Status: AC
  Filled 2023-04-20: qty 5

## 2023-04-20 MED ORDER — LACTATED RINGERS IV SOLN: INTRAVENOUS | Status: DC

## 2023-04-20 MED ORDER — FENTANYL CITRATE (PF) 100 MCG/2ML IJ SOLN: 25.0000 ug | INTRAMUSCULAR | Status: DC | PRN

## 2023-04-20 MED ORDER — CEFAZOLIN SODIUM 1 G IJ SOLR
INTRAMUSCULAR | Status: AC
  Filled 2023-04-20: qty 20

## 2023-04-20 MED ORDER — VALACYCLOVIR HCL 1 G PO TABS
1000.0000 mg | ORAL_TABLET | Freq: Three times a day (TID) | ORAL | 0 refills | Status: AC
Start: 2023-04-20 — End: 2023-04-27

## 2023-04-20 MED ORDER — PROPOFOL 10 MG/ML IV BOLUS
INTRAVENOUS | Status: AC
  Filled 2023-04-20: qty 20

## 2023-04-20 SURGICAL SUPPLY — 20 items
BAG DRAIN URO-CYSTO SKYTR STRL (DRAIN) ×2 IMPLANT
BAG DRN UROCATH (DRAIN) ×2
CLOTH BEACON ORANGE TIMEOUT ST (SAFETY) ×2 IMPLANT
ELECT REM PT RETURN 9FT ADLT (ELECTROSURGICAL)
ELECTRODE REM PT RTRN 9FT ADLT (ELECTROSURGICAL) ×2 IMPLANT
GLOVE BIO SURGEON STRL SZ 6.5 (GLOVE) ×2 IMPLANT
GOWN STRL REUS W/TWL LRG LVL3 (GOWN DISPOSABLE) ×2 IMPLANT
KIT TURNOVER CYSTO (KITS) ×2 IMPLANT
MANIFOLD NEPTUNE II (INSTRUMENTS) ×2 IMPLANT
NDL ASPIRATION 22 (NEEDLE) ×2 IMPLANT
NDL SAFETY ECLIP 18X1.5 (MISCELLANEOUS) ×2 IMPLANT
NEEDLE ASPIRATION 22 (NEEDLE) ×2 IMPLANT
PACK CYSTO (CUSTOM PROCEDURE TRAY) ×2 IMPLANT
SLEEVE SCD COMPRESS KNEE MED (STOCKING) ×2 IMPLANT
SYR 20ML LL LF (SYRINGE) ×2 IMPLANT
SYR CONTROL 10ML LL (SYRINGE) ×2 IMPLANT
SYSTEM URETHRAL BULK BULKAMID (Female Continence) IMPLANT
TUBE CONNECTING 12X1/4 (SUCTIONS) ×2 IMPLANT
TUBING UROLOGY SET (TUBING) ×2 IMPLANT
WATER STERILE IRR 3000ML UROMA (IV SOLUTION) ×2 IMPLANT

## 2023-04-20 NOTE — Interval H&P Note (Signed)
History and Physical Interval Note:  04/20/2023 10:38 AM  [name]  has presented today for surgery, with the diagnosis of MIXED URINARY INCONTINENCE.  The various methods of treatment have been discussed with the patient and family. After consideration of risks, benefits and other options for treatment, the patient has consented to  Procedure(s) with comments: CYSTOSCOPY WITH INJECTION OF BULKAMID (N/A) - 45 MINS BOTOX INJECTION 100 UNITS (N/A) as a surgical intervention.  The patient's history has been reviewed, patient examined, no change in status, stable for surgery.  I have reviewed the patient's chart and labs.  Questions were answered to the patient's satisfaction.     Maxyne Derocher D Rashawd Laskaris

## 2023-04-20 NOTE — Progress Notes (Signed)
Virtual Visit Consent   ZELPHIA GLOVER, you are scheduled for a virtual visit with a [facility] provider today. Just as with appointments in the office, your consent must be obtained to participate. Your consent will be active for this visit and any virtual visit you may have with one of our providers in the next 365 days. If you have a MyChart account, a copy of this consent can be sent to you electronically.  As this is a virtual visit, video technology does not allow for your provider to perform a traditional examination. This may limit your provider's ability to fully assess your condition. If your provider identifies any concerns that need to be evaluated in person or the need to arrange testing (such as labs, EKG, etc.), we will make arrangements to do so. Although advances in technology are sophisticated, we cannot ensure that it will always work on either your end or our end. If the connection with a video visit is poor, the visit may have to be switched to a telephone visit. With either a video or telephone visit, we are not always able to ensure that we have a secure connection.  By engaging in this virtual visit, you consent to the provision of healthcare and authorize for your insurance to be billed (if applicable) for the services provided during this visit. Depending on your insurance coverage, you may receive a charge related to this service.  I need to obtain your verbal consent now. Are you willing to proceed with your visit today? [name] has provided verbal consent on 04/20/2023 for a virtual visit (video or telephone). Piedad Climes, [place]  Date: 04/20/2023 4:08 PM  Virtual Visit via Video Note   I, Piedad Climes, connected with  [name]  ([id number], 1940/12/23) on 04/20/23 at  4:00 PM EDT by a video-enabled telemedicine application and verified that I am speaking with the correct person using two identifiers.  Location: Patient: Virtual  Visit Location Patient: Home Provider: Virtual Visit Location Provider: Home Office   I discussed the limitations of evaluation and management by telemedicine and the availability of in person appointments. The patient expressed understanding and agreed to proceed.    History of Present Illness: [name] is a 82 y.o. who identifies as a female who was assigned female at birth, and is being seen today for possible shingles rash. Patient endorses   HPI: HPI  Problems:  Patient Active Problem List   Diagnosis Date Noted   SOB (shortness of breath) 04/16/2023   Preoperative cardiovascular examination 04/15/2023   Coronary artery calcification seen on CT scan 04/15/2023   Generalized pain 11/09/2022   Situational anxiety 11/09/2022   Absence of bladder continence 07/21/2022   Impacted cerumen of left ear 07/21/2022   Lower extremity edema 05/01/2022   Itching 05/01/2022   Hospital discharge follow-up 04/27/2022   Cellulitis of right lower extremity 04/23/2022   Right leg swelling 04/10/2022   Acute cough 11/21/2021   Urinary urgency 11/03/2021   Right hip pain 07/25/2021   Abnormal x-ray of lumbar spine 07/25/2021   Acute cystitis with hematuria 06/05/2021   Vaginal irritation 05/20/2021   OAB (overactive bladder) 05/20/2021   Burning with urination 05/20/2021   Hypokalemia 01/01/2021   Anemia 01/01/2021   Acute respiratory failure with hypoxia (HCC) 10/31/2020   Post-menopausal bleeding 07/08/2020   History of pulmonary embolism 04/01/2020   Abnormal urine odor 06/27/2019   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/27/2019   Other fatigue 11/24/2018   ILD (interstitial lung disease) (HCC) 11/24/2018   Pulmonary nodule 11/24/2018   Chronic diarrhea 05/11/2017   Rheumatoid arthritis (HCC) 01/24/2016   Preventative health care 09/11/2015   Acute left-sided low back pain without sciatica 09/11/2015   Benign paroxysmal positional vertigo 11/13/2014    Bilateral leg pain 04/04/2013   Severe obesity with body mass index (BMI) of 36.0 to 36.9 with serious comorbidity (HCC) 10/12/2012   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/12/2012   PSVT (paroxysmal supraventricular tachycardia) 02/15/2012   Dizziness 06/13/2008   Prediabetes 01/10/2008   Hyperlipidemia 01/04/2008   Essential hypertension, benign 01/04/2008   GERD 01/04/2008   OSTEOARTHRITIS 01/04/2008   SUPERFICIAL THROMBOPHLEBITIS 10/05/1988    Allergies:  Allergies  Allergen Reactions   Codeine Nausea And Vomiting   Hydrocodone Bit-Homatrop Mbr Other (See Comments)    Hallucinations    Medications:  Current Outpatient Medications:    valACYclovir (VALTREX) 1000 MG tablet, Take 1 tablet (1,000 mg total) by mouth 3 (three) times daily for 7 days., Disp: 21 tablet, Rfl: 0   acetaminophen (TYLENOL) 500 MG tablet, Take 1,000 mg by mouth in the morning and at bedtime., Disp: , Rfl:    folic acid (FOLVITE) 1 MG tablet, Take 2 mg by mouth daily., Disp: , Rfl:    hydroxychloroquine (PLAQUENIL) 200 MG tablet, Take 200 mg by mouth 2 (two) times daily., Disp: , Rfl:    losartan (COZAAR) 25 MG tablet, Take 1 tablet by mouth once daily, Disp: 90 tablet, Rfl: 0   predniSONE (DELTASONE) 5 MG tablet, Take by mouth., Disp: , Rfl:    Vitamin D, Cholecalciferol, 25 MCG (1000 UT) TABS, Take 1 tablet by mouth 2 (two) times daily., Disp: , Rfl:    XARELTO 20 MG TABS tablet, TAKE 1 TABLET BY MOUTH  DAILY WITH SUPPER, Disp: 90 tablet, Rfl: 3 No current facility-administered medications for this visit.  Facility-Administered Medications Ordered in Other Visits:    regadenoson (LEXISCAN) injection SOLN 0.4 mg, 0.4 mg, Intravenous, Once, Hilty, Lisette Abu, MD   technetium tetrofosmin (TC-MYOVIEW) injection 32.7 millicurie, 32.7 millicurie, Intravenous, Once PRN, Hilty, Lisette Abu, MD  Observations/Objective: Patient is well-developed, well-nourished in no acute distress.  Resting comfortably  at home.  Head is normocephalic,  atraumatic.  No labored breathing. Speech is clear and coherent with logical content.  Patient is alert and oriented at baseline.  Clustered vesicles noted on the R lateral thigh with erythema. Concern for shingles.     Assessment and Plan: 1. Herpes zoster without complication - valACYclovir (VALTREX) 1000 MG tablet; Take 1 tablet (1,000 mg total) by mouth 3 (three) times daily for 7 days.  Dispense: 21 tablet; Refill: 0  Start Valtrex as directed. Supportive measures and OTC medications reviewed.   Follow Up Instructions: I discussed the assessment and treatment plan with the patient. The patient was provided an opportunity to ask questions and all were answered. The patient agreed with the plan and demonstrated an understanding of the instructions.  A copy of instructions were sent to the patient via MyChart unless otherwise noted below.   The patient was advised to call back or seek an in-person evaluation if the symptoms worsen or if the condition fails to improve as anticipated.  Time:  I spent 10  minutes with the patient via telehealth technology discussing the above problems/concerns.    Piedad Climes, PA-C

## 2023-04-20 NOTE — Op Note (Addendum)
Operative Note   Preoperative diagnosis:  1.  Stress urinary incontinence 2.  Urge urinary incontinence   Postoperative diagnosis: 1.  Stress urinary incontinence 2.  Urge urinary incontinence   Procedure(s): 1.  Cystoscopy with injection of bulkamid  2.  Cystoscopy with injection of 100units of botox   Surgeon: Kasandra Knudsen, MD   Assistants:  None   Anesthesia:  General   Complications:  None   EBL:  minimal   Specimens: 1. none   Drains/Catheters: 1.  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  She is here for Bulkamid and Botox injections prior to vulvectomy to see if we can improve incontinence to aid in healing of above surgery scheduled for 8/8.   Description of procedure:   After risks and benefits of the procedure discussed with the patient, informed consent was obtained.  The patient was taken to the operating placed in the supine position.  Anesthesia was induced and antibiotics were administered.  The patient was then repositioned in the dorsolithotomy position.  She was prepped and draped in usual sterile fashion a timeout performed with the attending present.  The 21 French rigid injection cystoscope was advanced into the urethra and into the bladder under direct visualization.  100 units of Botox mixed in 20 cc of sterile saline were injected in standard template in the posterior bladder wall taking care to avoid the ureteral orifices.  The injection scope was removed.  The cystoscope was assembled with the Bulkamid system.  It was then placed in the urethral meatus and advanced into the bladder under direct visualization.   The cystoscope was brought back to the bladder neck and the needle was advanced through the needle guide at the 1 o'clock  position.  Once it was visualized and advanced it was rotated to the 5 o'clock position.  Bulkamid was then injected until a cushion was seen.  This was then repeated at the 1 o'clock position in the 7 o'clock position until coaptation was noted.   This concluded the case.  The patient's bladder was left with approximately 200 cc of sterile saline.  The patient emerged from anesthesia and was transferred the PACU in stable condition.   Plan:  Plan for patient to void in PACU prior to discharge.

## 2023-04-20 NOTE — Telephone Encounter (Signed)
Patient had a procedure done today for urinary incontinence,while at her procedure the doctor discussed possible shingles on her left butt cheek that patient has been complaining about for a while now,and they said that it looked like shingles.They told her to contact her PCP. Her daughter is sin the process of sending over pictures to [facility] of the spot on her butt cheek. She would like medication to be sent in if it is possibly shingles. She said that her mom is not up for coming in for a visit due to herr procedure today and other appointments tomorrow. She would matt to look at  the pictures to see what he thinks.

## 2023-04-20 NOTE — Telephone Encounter (Signed)
Called and spoke to pt and daughter. Advised that it really did look like Shingles but that she would need to be seen by a virtual visit for treatment. We were full in the office but I help get her scheduled on [email or web address] and made her an virtual visit at 4pm today. If they have any issues, they will send another mychart message.

## 2023-04-20 NOTE — Transfer of Care (Signed)
Immediate Anesthesia Transfer of Care Note  Patient: [name]  Procedure(s) Performed: CYSTOSCOPY WITH INJECTION OF BULKAMID (Urethra) BOTOX INJECTION 100 UNITS (Bladder)  Patient Location: PACU  Anesthesia Type:General  Level of Consciousness: awake, alert , oriented, and patient cooperative  Airway & Oxygen Therapy: Patient Spontanous Breathing and Patient connected to nasal cannula oxygen  Post-op Assessment: Report given to RN and Post -op Vital signs reviewed and stable  Post vital signs: Reviewed and stable  Last Vitals:  Vitals Value Taken Time  BP 162/81 04/20/23 1220  Temp    Pulse 75 04/20/23 1223  Resp 19 04/20/23 1223  SpO2 97 % 04/20/23 1223  Vitals shown include unfiled device data.  Last Pain:  Vitals:   04/20/23 1042  TempSrc: Oral  PainSc: 5       Patients Stated Pain Goal: 5 (04/20/23 1042)  Complications: No notable events documented.

## 2023-04-20 NOTE — Anesthesia Postprocedure Evaluation (Signed)
Anesthesia Post Note  Patient: [name]  Procedure(s) Performed: CYSTOSCOPY WITH INJECTION OF BULKAMID (Urethra) BOTOX INJECTION 100 UNITS (Bladder)     Patient location during evaluation: PACU Anesthesia Type: General Level of consciousness: awake and alert Pain management: pain level controlled Vital Signs Assessment: post-procedure vital signs reviewed and stable Respiratory status: spontaneous breathing, nonlabored ventilation, respiratory function stable and patient connected to nasal cannula oxygen Cardiovascular status: blood pressure returned to baseline and stable Postop Assessment: no apparent nausea or vomiting Anesthetic complications: no   No notable events documented.  Last Vitals:  Vitals:   04/20/23 1245 04/20/23 1300  BP: (!) 179/93 (!) 178/94  Pulse: 79 78  Resp: 15 (!) 22  Temp:  (!) 36.4 C  SpO2: 92% 92%    Last Pain:  Vitals:   04/20/23 1230  TempSrc:   PainSc: 0-No pain                 Trevor Iha

## 2023-04-20 NOTE — Anesthesia Procedure Notes (Signed)
Procedure Name: LMA Insertion Date/Time: 04/20/2023 11:52 AM  Performed by: Earmon Phoenix, CRNAPre-anesthesia Checklist: Patient identified, Emergency Drugs available, Suction available, Patient being monitored and Timeout performed Patient Re-evaluated:Patient Re-evaluated prior to induction Oxygen Delivery Method: Circle system utilized Preoxygenation: Pre-oxygenation with 100% oxygen Induction Type: IV induction Ventilation: Mask ventilation without difficulty LMA: LMA inserted LMA Size: 4.0 Number of attempts: 1 Placement Confirmation: positive ETCO2 and breath sounds checked- equal and bilateral Tube secured with: Tape Dental Injury: Teeth and Oropharynx as per pre-operative assessment

## 2023-04-20 NOTE — Patient Instructions (Signed)
[name], thank you for joining Piedad Climes, PA-C for today's virtual visit.  While this provider is not your primary care provider (PCP), if your PCP is located in our provider database this encounter information will be shared with them immediately following your visit.   A [facility] MyChart account gives you access to today's visit and all your visits, tests, and labs performed at [facility] " click here if you don't have a [facility] MyChart account or go to [email or web address]  Consent: (Patient) [name] provided verbal consent for this virtual visit at the beginning of the encounter.  Current Medications:  Current Outpatient Medications:    acetaminophen (TYLENOL) 500 MG tablet, Take 1,000 mg by mouth in the morning and at bedtime., Disp: , Rfl:    folic acid (FOLVITE) 1 MG tablet, Take 2 mg by mouth daily., Disp: , Rfl:    hydroxychloroquine (PLAQUENIL) 200 MG tablet, Take 200 mg by mouth 2 (two) times daily., Disp: , Rfl:    losartan (COZAAR) 25 MG tablet, Take 1 tablet by mouth once daily, Disp: 90 tablet, Rfl: 0   predniSONE (DELTASONE) 5 MG tablet, Take by mouth., Disp: , Rfl:    Vitamin D, Cholecalciferol, 25 MCG (1000 UT) TABS, Take 1 tablet by mouth 2 (two) times daily., Disp: , Rfl:    XARELTO 20 MG TABS tablet, TAKE 1 TABLET BY MOUTH  DAILY WITH SUPPER, Disp: 90 tablet, Rfl: 3 No current facility-administered medications for this visit.  Facility-Administered Medications Ordered in Other Visits:    acetaminophen (OFIRMEV) IV 1,000 mg, 1,000 mg, Intravenous, Once PRN, Trevor Iha, MD   fentaNYL (SUBLIMAZE) injection 25-50 mcg, 25-50 mcg, Intravenous, Q5 min PRN, Trevor Iha, MD   lactated ringers infusion, , Intravenous, Continuous, Lannie Fields, DO, Last Rate: 50 mL/hr at 04/20/23 1101, Restarted at 04/20/23 1146   ondansetron (ZOFRAN) injection 4 mg, 4 mg, Intravenous, Once PRN, Trevor Iha,  MD   regadenoson (LEXISCAN) injection SOLN 0.4 mg, 0.4 mg, Intravenous, Once, Hilty, Lisette Abu, MD   technetium tetrofosmin (TC-MYOVIEW) injection 32.7 millicurie, 32.7 millicurie, Intravenous, Once PRN, Hilty, Lisette Abu, MD   Medications ordered in this encounter:  No orders of the defined types were placed in this encounter.    *If you need refills on other medications prior to your next appointment, please contact your pharmacy*  Follow-Up: Call back or seek an in-person evaluation if the symptoms worsen or if the condition fails to improve as anticipated.  [facility] [phone]  Other Instructions E-visit for Shingles   We are sorry that you are not feeling well. Here is how we plan to help!  Based on what you shared with me it looks like you have shingles.  Shingles or herpes zoster, is a common infection of the nerves.  It is a painful rash caused by the herpes zoster virus.  This is the same virus that causes chickenpox.  After a person has chickenpox, the virus remains inactive in the nerve cells.  Years later, the virus can become active again and travel to the skin.  It typically will appear on one side of the face or body.  Burning or shooting pain, tingling, or itching are early signs of the infection.  Blisters typically scab over in 7 to 10 days and clear up within 2-4 weeks. Shingles is only contagious to people that have never had the chickenpox, the chickenpox vaccine, or anyone who has  a compromised immune system.  You should avoid contact with these type of people until your blisters scab over.  I have prescribed Valacyclovir 1g three times daily for 7 days   HOME CARE: Apply ice packs (wrapped in a thin towel), cool compresses, or soak in cool bath to help reduce pain. Use calamine lotion to calm itchy skin. Avoid scratching the rash. Avoid direct sunlight.  GET HELP RIGHT AWAY IF: Symptoms that don't away after treatment. A rash or blisters  near your eye. Increased drainage, fever, or rash after treatment. Severe pain that doesn't go away.   MAKE SURE YOU   Understand these instructions. Will watch your condition. Will get help right away if you are not doing well or get worse.   If you have been instructed to have an in-person evaluation today at a local Urgent Care facility, please use the link below. It will take you to a list of all of our available [facility], including address, phone number and hours of operation. Please do not delay care.  [facility]  If you or a family member do not have a primary care provider, use the link below to schedule a visit and establish care. When you choose a [facility] primary care physician or advanced practice provider, you gain a long-term partner in health. Find a Primary Care Provider  Learn more about [facility]'s in-office and virtual care options: [facility] - Get Care Now

## 2023-04-20 NOTE — Discharge Instructions (Addendum)
Cystoscopy with Botox and Bulkamid patient instructions  Following a cystoscopy, a catheter (a flexible rubber tube) is sometimes left in place to empty the bladder. This may cause some discomfort or a feeling that you need to urinate. Your doctor determines the period of time that the catheter will be left in place. You may have bloody urine for two to three days (Call your doctor if the amount of bleeding increases or does not subside).  You may pass blood clots in your urine, especially if you had a biopsy. It is not unusual to pass small blood clots and have some bloody urine a couple of weeks after your cystoscopy. Again, call your doctor if the bleeding does not subside. You may have: Dysuria (painful urination) Frequency (urinating often) Urgency (strong desire to urinate)  These symptoms are common especially if medicine is instilled into the bladder or a ureteral stent is placed. Avoiding alcohol and caffeine, such as coffee, tea, and chocolate, may help relieve these symptoms. Drink plenty of water, unless otherwise instructed. Your doctor may also prescribe an antibiotic or other medicine to reduce these symptoms.  Cystoscopy results are available soon after the procedure; biopsy results usually take two to four days. Your doctor will discuss the results of your exam with you. Before you go home, you will be given specific instructions for follow-up care. Special Instructions:   If you are going home with a catheter in place do not take a tub bath until removed by your doctor.   You may resume your normal activities.   Do not drive or operate machinery if you are taking narcotic pain medicine.   Be sure to keep all follow-up appointments with your doctor.   Call Your Doctor If: The catheter is not draining You have severe pain You are unable to urinate You have a fever over 101 You have severe bleeding          Medication: Tylenol may help with any discomfort. You can use AZO  which is over the counter for burning with urination.      Post Anesthesia Home Care Instructions  Activity: Get plenty of rest for the remainder of the day. A responsible individual must stay with you for 24 hours following the procedure.  For the next 24 hours, DO NOT: -Drive a car -Advertising copywriter -Drink alcoholic beverages -Take any medication unless instructed by your physician -Make any legal decisions or sign important papers.  Meals: Start with liquid foods such as gelatin or soup. Progress to regular foods as tolerated. Avoid greasy, spicy, heavy foods. If nausea and/or vomiting occur, drink only clear liquids until the nausea and/or vomiting subsides. Call your physician if vomiting continues.  Special Instructions/Symptoms: Your throat may feel dry or sore from the anesthesia or the breathing tube placed in your throat during surgery. If this causes discomfort, gargle with warm salt water. The discomfort should disappear within 24 hours.

## 2023-04-21 ENCOUNTER — Ambulatory Visit: Payer: Medicare Other | Admitting: Pulmonary Disease

## 2023-04-21 ENCOUNTER — Encounter (HOSPITAL_BASED_OUTPATIENT_CLINIC_OR_DEPARTMENT_OTHER): Payer: Self-pay | Admitting: Urology

## 2023-04-21 VITALS — BP 136/70 | HR 94 | Temp 98.3°F | Ht 62.0 in | Wt 222.4 lb

## 2023-04-21 DIAGNOSIS — J849 Interstitial pulmonary disease, unspecified: Secondary | ICD-10-CM | POA: Diagnosis not present

## 2023-04-21 DIAGNOSIS — M069 Rheumatoid arthritis, unspecified: Secondary | ICD-10-CM

## 2023-04-21 NOTE — Telephone Encounter (Signed)
Patient was able to get set up for virtual visit yesterday and has been treated. No further action needed.

## 2023-04-21 NOTE — Telephone Encounter (Signed)
Received cardiology clearance

## 2023-04-21 NOTE — Telephone Encounter (Signed)
Noted. Agree with her being seen

## 2023-04-21 NOTE — Telephone Encounter (Signed)
     Primary Cardiologist: Charlton Haws, MD  Chart reviewed as part of pre-operative protocol coverage. Given past medical history and time since last visit, based on ACC/AHA guidelines, ANJANAE WOEHRLE would be at acceptable risk for the planned procedure without further cardiovascular testing.   Ms. Cantave perioperative risk of a major cardiac event is 0.4% according to the Revised Cardiac Risk Index (RCRI).  Therefore, she is at low risk for perioperative complications.   Her functional capacity is poor at 3.63 METs according to the [employer] Activity Status Index (DASI). She has chronic shortness of breath and questionable orthopnea.   I will route this recommendation to the requesting party via Epic fax function and remove from pre-op pool.  Please call with questions.  Thomasene Ripple. Schon Zeiders NP-C     04/21/2023, 2:00 PM [facility] [address] Office [phone] Fax [phone]

## 2023-04-21 NOTE — Telephone Encounter (Signed)
Once I have reviewed the pictures I will make a decision

## 2023-04-21 NOTE — Patient Instructions (Signed)
I am glad you are doing well with your breathing  I do not see any obvious contraindication to surgery and we will send this message to your doctor  Follow-up as scheduled with Dr. Marchelle Gearing.

## 2023-04-21 NOTE — Telephone Encounter (Signed)
Caller is following-up to check if patient has been cleared for surgery on 7/25.

## 2023-04-21 NOTE — Telephone Encounter (Signed)
I will forward to pre op APP to review if pt has been cleared. Pt recently seen by Tereso Newcomer, PAC.

## 2023-04-21 NOTE — Progress Notes (Signed)
[name]    [id number]    1941-03-01  Primary Care Physician:Cable, Genene Churn, NP  Referring Physician: Eden Emms, NP [address] [place],  [place] [address]  Chief complaint: Preop evaluation, interstitial lung disease  HPI: 82 y.o. who  has a past medical history of Ambulates with cane, Antiphospholipid antibody syndrome (HCC), Breast cancer, Chest discomfort on left, chronic sob on occasion, Coronary artery calcification seen on CT scan (04/15/2023), Dysrhythmia, History of pulmonary embolism, Hyperlipidemia, Hypertension, Interstitial lung disease (HCC), Left arm numbness, Migraines, Obesity, Osteoarthritis, Personal history of radiation therapy (2020), Phlebitis of leg, right, superficial (1986/1989), RA (rheumatoid arthritis) (HCC), and Urinary incontinence.   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  She is currently not on any treatment for interstitial lung disease.  Rituxan was considered in spring 2022 but did not go through with it.  She follows with Dr. 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  No feather pillows or comforters Smoking history: Quit 50 years ago Travel history: No significant travel history Relevant family history: No family history of lung disease  Outpatient Encounter Medications as of 04/21/2023  Medication Sig   acetaminophen (TYLENOL) 500 MG tablet Take 1,000 mg by mouth in the morning and at bedtime.   folic acid (FOLVITE) 1 MG tablet Take 2 mg by mouth daily.   hydroxychloroquine (PLAQUENIL) 200 MG tablet Take 200 mg by mouth 2 (two) times daily.   losartan (COZAAR) 25 MG tablet Take 1 tablet by  mouth once daily   predniSONE (DELTASONE) 5 MG tablet Take by mouth.   valACYclovir (VALTREX) 1000 MG tablet Take 1 tablet (1,000 mg total) by mouth 3 (three) times daily for 7 days.   Vitamin D, Cholecalciferol, 25 MCG (1000 UT) TABS Take 1 tablet by mouth 2 (two) times daily.   XARELTO 20 MG TABS tablet TAKE 1 TABLET BY MOUTH  DAILY WITH SUPPER   Facility-Administered Encounter Medications as of 04/21/2023  Medication   regadenoson (LEXISCAN) injection SOLN 0.4 mg   technetium tetrofosmin (TC-MYOVIEW) injection 32.7 millicurie    Allergies as of 04/21/2023 - Review Complete 04/21/2023  Allergen Reaction Noted   Codeine Nausea And Vomiting    Hydrocodone bit-homatrop mbr Other (See Comments) 10/06/2016    Past Medical History:  Diagnosis Date   Ambulates with cane    Antiphospholipid antibody syndrome (HCC)    Breast cancer    right   Chest discomfort on left    when lies down, she has to sit up   chronic sob on occasion    Coronary artery calcification seen on CT scan 04/15/2023   Dysrhythmia    PSVT/ palpitations-  controlled with prn Inderal   History of pulmonary embolism    2020 or 2021   Hyperlipidemia    statin intolerant   Hypertension    Interstitial lung disease (HCC)    Left arm numbness    at rest and with activity   Migraines    Obesity    Osteoarthritis    Personal history of radiation therapy 2020   right breast   Phlebitis of leg, right, superficial 1986/1989   x 2  RA (rheumatoid arthritis) (HCC)    Urinary incontinence     Past Surgical History:  Procedure Laterality Date   BOTOX INJECTION N/A 04/20/2023   Procedure: BOTOX INJECTION 100 UNITS;  Surgeon: Noel Christmas, MD;  Location: [facility];  Service: Urology;  Laterality: N/A;   BREAST BIOPSY Left 07/05/2019   BREAST BIOPSY Bilateral 08/24/2019   BREAST EXCISIONAL BIOPSY Left 09/20/2019   BREAST LUMPECTOMY Right 09/20/2019   BREAST LUMPECTOMY WITH RADIOACTIVE SEED  LOCALIZATION Bilateral 09/20/2019   Procedure: BILATERAL BREAST LUMPECTOMIES WITH BILATERAL RADIOACTIVE SEED LOCALIZATION AND RIGHT NIPPLE BIOPSY;  Surgeon: Harriette Bouillon, MD;  Location: [facility];  Service: General;  Laterality: Bilateral;   CARDIAC CATHETERIZATION  1990s   negative   CARDIOVASCULAR STRESS TEST  2008   NML   CARDIOVASCULAR STRESS TEST  02/02/2007   EF 70%, NO ISCHEMIA   CARPAL TUNNEL RELEASE Bilateral    CARPAL TUNNEL RELEASE Bilateral    CYSTOSCOPY WITH INJECTION N/A 04/20/2023   Procedure: CYSTOSCOPY WITH INJECTION OF Mariam Dollar;  Surgeon: Noel Christmas, MD;  Location: [facility] [place];  Service: Urology;  Laterality: N/A;  45 MINS   DILATION AND CURETTAGE, DIAGNOSTIC / THERAPEUTIC  2008   LESION DESTRUCTION  10/2017   Face; Dr. Yetta Barre   ROTATOR CUFF REPAIR  1999   Right, left in 2002   TOTAL KNEE ARTHROPLASTY  2008   Left   TOTAL KNEE ARTHROPLASTY  10/11/2012   Procedure: TOTAL KNEE ARTHROPLASTY;  Surgeon: Shelda Pal, MD;  Location: [facility];  Service: Orthopedics;  Laterality: Right;   US ECHOCARDIOGRAPHY  02/07/2007   EF 55-60%    Family History  Problem Relation Age of Onset   Hypertension Mother    Heart attack Father 45   Hypertension Father    Peptic Ulcer Disease Father    Heart defect Sister    Lung cancer Brother    Coronary artery disease Other        Female 1st degree relative <50   Colon cancer Neg Hx    Prostate cancer Neg Hx    Ovarian cancer Neg Hx    Pancreatic cancer Neg Hx    Endometrial cancer Neg Hx    Breast cancer Neg Hx     Social History   Socioeconomic History   Marital status: Married    Spouse name: Not on file   Number of children: Not on file   Years of education: Not on file   Highest education level: Not on file  Occupational History   Occupation: Retired  Tobacco Use   Smoking status: Former    Current packs/day: 0.00    Average packs/day: 0.5 packs/day for 11.0 years (5.5 ttl  pk-yrs)    Types: Cigarettes    Start date: 18    Quit date: 10/06/1963    Years since quitting: 59.5   Smokeless tobacco: Never  Vaping Use   Vaping status: Never Used  Substance and Sexual Activity   Alcohol use: No    Comment: stopped 5 years ago   Drug use: No   Sexual activity: Yes  Other Topics Concern   Not on file  Social History Narrative   Regular exercise: yes walks 1 mile a day   Diet: loves butter, fruit and veggies   Social Determinants of Health   Financial Resource Strain: Low Risk  (02/25/2022)   Overall Financial Resource Strain (CARDIA)    Difficulty of Paying Living Expenses: Not hard  at all  Food Insecurity: No Food Insecurity (02/25/2022)   Hunger Vital Sign    Worried About Running Out of Food in the Last Year: Never true    Ran Out of Food in the Last Year: Never true  Transportation Needs: No Transportation Needs (02/25/2022)   PRAPARE - Administrator, Civil Service (Medical): No    Lack of Transportation (Non-Medical): No  Physical Activity: Sufficiently Active (02/25/2022)   Exercise Vital Sign    Days of Exercise per Week: 7 days    Minutes of Exercise per Session: 60 min  Stress: No Stress Concern Present (02/25/2022)   [employer] of Occupational Health - Occupational Stress Questionnaire    Feeling of Stress : Not at all  Social Connections: Not on file  Intimate Partner Violence: Not At Risk (05/10/2020)   Humiliation, Afraid, Rape, and Kick questionnaire    Fear of Current or Ex-Partner: No    Emotionally Abused: No    Physically Abused: No    Sexually Abused: No    Review of systems: Review of Systems  Constitutional: Negative for fever and chills.  HENT: Negative.   Eyes: Negative for blurred vision.  Respiratory: as per HPI  Cardiovascular: Negative for chest pain and palpitations.  Gastrointestinal: Negative for vomiting, diarrhea, blood per rectum. Genitourinary: Negative for dysuria, urgency, frequency and  hematuria.  Musculoskeletal: Negative for myalgias, back pain and joint pain.  Skin: Negative for itching and rash.  Neurological: Negative for dizziness, tremors, focal weakness, seizures and loss of consciousness.  Endo/Heme/Allergies: Negative for environmental allergies.  Psychiatric/Behavioral: Negative for depression, suicidal ideas and hallucinations.  All other systems reviewed and are negative.  Physical Exam: Blood pressure 136/70, pulse 94, temperature 98.3 F (36.8 C), temperature source Oral, height 5\' 2"  (1.575 m), weight 222 lb 6.4 oz (100.9 kg), SpO2 95%. Gen:      No acute distress HEENT:  EOMI, sclera anicteric Neck:     No masses; no thyromegaly Lungs:    Clear to auscultation bilaterally; normal respiratory effort CV:         Regular rate and rhythm; no murmurs Abd:      + bowel sounds; soft, non-tender; no palpable masses, no distension Ext:    No edema; adequate peripheral perfusion Skin:      Warm and dry; no rash Neuro: alert and oriented x 3 Psych: normal mood and affect  Data Reviewed: Imaging: Chest x-ray 04/26/2022-no active cardiopulmonary disease.  High resolution CT 11/26/2020-mild interstitial lung disease and basilar predominant pattern which is stable.  Probable UIP pattern I had reviewed the images personally.  PFTs: 10/29/2022 FVC 1.93 [81%], FEV1 1.42 [81%], F/F73, DLCO 16.86 [93%] Normal test  Labs:  Assessment:  Connective tissue disease rule out interstitial lung disease Probable UIP pattern Preop pulmonary evaluation  She appears to have mild disease without progression over the years.  Previous PFTs earlier this year are within normal limits which is reassuring Overall per ARISCAT criteria she is at Intermediate risk with 13.3% pulmonary complication rate but there is no contraindication to surgery if needed  Peri-operative Assessment of Pulmonary Risk for Non-Thoracic Surgery:  ForMs. Montelongo, risk of perioperative pulmonary  complications is increased by:  Age greater than 65 years  Interstitial lung disease  Respiratory complications generally occur in 1% of ASA Class I patients, 5% of ASA Class II and 10% of ASA Class III-IV patients These complications rarely result in mortality and iclude postoperative pneumonia, atelectasis, pulmonary embolism, ARDS  and increased time requiring postoperative mechanical ventilation.  Overall, I recommend proceeding with the surgery if the risk for respiratory complications are outweighed by the potential benefits. This will need to be discussed between the patient and surgeon.  To reduce risks of respiratory complications, I recommend: --Pre- and post-operative incentive spirometry performed frequently while awake --Avoiding use of pancuronium during anesthesia.  I have discussed the risk factors and recommendations above with the patient.   Plan/Recommendations: Preop pulmonary evaluation completed Follow-up with Dr. Marchelle Gearing as scheduled.  Chilton Greathouse MD [facility] 04/21/2023, 1:15 PM  CC: Eden Emms, NP

## 2023-04-26 NOTE — Progress Notes (Signed)
Pt has been made aware of normal result and verbalized understanding.  jw

## 2023-05-06 DIAGNOSIS — C50019 Malignant neoplasm of nipple and areola, unspecified female breast: Secondary | ICD-10-CM

## 2023-05-06 HISTORY — DX: Malignant neoplasm of nipple and areola, unspecified female breast: C50.019

## 2023-05-24 ENCOUNTER — Encounter: Payer: Medicare Other | Admitting: Psychiatry

## 2023-05-31 ENCOUNTER — Inpatient Hospital Stay: Payer: Medicare Other | Admitting: Psychiatry

## 2023-06-08 NOTE — Progress Notes (Signed)
This encounter was created in error - please disregard.

## 2023-06-18 ENCOUNTER — Telehealth: Payer: Self-pay | Admitting: Nurse Practitioner

## 2023-06-18 NOTE — Telephone Encounter (Signed)
Lillia Abed from [facility] called stating the pt was just discharged from rehab & the rehab referred pt for [facility]. Lillia Abed asked if Toney Reil will start giving orders for pt? Lillia Abed stated the pt has a Careers adviser, Dr. Freddie Apley, who'll give wound care orders, Toney Reil is needed to give other orders.   Home Health verbal orders Caller Name: lindsay  Agency Name: [facility]    Callback number: [phone], secured   Requesting OT/PT/Skilled nursing/Social Work/Speech: nursing, pt/ov eval   Reason:  Frequency: nursing - once week 4   Please forward to [facility] pool or providers CMA

## 2023-06-18 NOTE — Telephone Encounter (Signed)
Verbal orders ok

## 2023-06-18 NOTE — Telephone Encounter (Signed)
Called number voicemail was not identified as receiver or that was secured. No message left at this time. Will continue to attempt contact at later time.

## 2023-06-19 ENCOUNTER — Other Ambulatory Visit (HOSPITAL_BASED_OUTPATIENT_CLINIC_OR_DEPARTMENT_OTHER): Payer: Self-pay

## 2023-06-19 ENCOUNTER — Emergency Department (HOSPITAL_BASED_OUTPATIENT_CLINIC_OR_DEPARTMENT_OTHER)
Admission: EM | Admit: 2023-06-19 | Discharge: 2023-06-19 | Disposition: A | Discharge status: Home / Self Care | Payer: Medicare Other | Attending: Emergency Medicine | Admitting: Emergency Medicine

## 2023-06-19 ENCOUNTER — Encounter (HOSPITAL_BASED_OUTPATIENT_CLINIC_OR_DEPARTMENT_OTHER): Payer: Self-pay

## 2023-06-19 DIAGNOSIS — N3289 Other specified disorders of bladder: Secondary | ICD-10-CM | POA: Insufficient documentation

## 2023-06-19 DIAGNOSIS — T83511A Infection and inflammatory reaction due to indwelling urethral catheter, initial encounter: Secondary | ICD-10-CM | POA: Diagnosis not present

## 2023-06-19 DIAGNOSIS — N390 Urinary tract infection, site not specified: Secondary | ICD-10-CM | POA: Diagnosis not present

## 2023-06-19 DIAGNOSIS — R1030 Lower abdominal pain, unspecified: Secondary | ICD-10-CM | POA: Diagnosis present

## 2023-06-19 LAB — CBC WITH DIFFERENTIAL/PLATELET
Abs Immature Granulocytes: 0.02 10*3/uL (ref 0.00–0.07)
Basophils Absolute: 0 10*3/uL (ref 0.0–0.1)
Basophils Relative: 0 %
Eosinophils Absolute: 0.2 10*3/uL (ref 0.0–0.5)
Eosinophils Relative: 3 %
HCT: 30.7 % — ABNORMAL LOW (ref 36.0–46.0)
Hemoglobin: 9.8 g/dL — ABNORMAL LOW (ref 12.0–15.0)
Immature Granulocytes: 0 %
Lymphocytes Relative: 22 %
Lymphs Abs: 1.5 10*3/uL (ref 0.7–4.0)
MCH: 30.2 pg (ref 26.0–34.0)
MCHC: 31.9 g/dL (ref 30.0–36.0)
MCV: 94.5 fL (ref 80.0–100.0)
Monocytes Absolute: 0.6 10*3/uL (ref 0.1–1.0)
Monocytes Relative: 9 %
Neutro Abs: 4.5 10*3/uL (ref 1.7–7.7)
Neutrophils Relative %: 66 %
Platelets: 228 10*3/uL (ref 150–400)
RBC: 3.25 MIL/uL — ABNORMAL LOW (ref 3.87–5.11)
RDW: 14.1 % (ref 11.5–15.5)
WBC: 6.7 10*3/uL (ref 4.0–10.5)
nRBC: 0 % (ref 0.0–0.2)

## 2023-06-19 LAB — BASIC METABOLIC PANEL
Anion gap: 8 (ref 5–15)
BUN: 9 mg/dL (ref 8–23)
CO2: 25 mmol/L (ref 22–32)
Calcium: 8.2 mg/dL — ABNORMAL LOW (ref 8.9–10.3)
Chloride: 100 mmol/L (ref 98–111)
Creatinine, Ser: 0.54 mg/dL (ref 0.44–1.00)
GFR, Estimated: >60 mL/min (ref >60)
Glucose, Bld: 110 mg/dL — ABNORMAL HIGH (ref 70–99)
Potassium: 4 mmol/L (ref 3.5–5.1)
Sodium: 133 mmol/L — ABNORMAL LOW (ref 135–145)

## 2023-06-19 LAB — URINALYSIS, ROUTINE W REFLEX MICROSCOPIC
Bilirubin Urine: NEGATIVE
Glucose, UA: NEGATIVE mg/dL
Ketones, ur: NEGATIVE mg/dL
Nitrite: POSITIVE — AB
Protein, ur: NEGATIVE mg/dL
Specific Gravity, Urine: 1.005 (ref 1.005–1.030)
pH: 8 (ref 5.0–8.0)

## 2023-06-19 MED ORDER — SODIUM CHLORIDE 0.9 % IV SOLN
1.0000 g | Freq: Once | INTRAVENOUS | Status: AC
  Administered 2023-06-19: 1 g via INTRAVENOUS

## 2023-06-19 MED ORDER — LEVOFLOXACIN 750 MG PO TABS
750.0000 mg | ORAL_TABLET | Freq: Every day | ORAL | 0 refills | Status: DC
Start: 2023-06-19 — End: 2023-06-25
  Filled 2023-06-19: qty 5, 5d supply, fill #0

## 2023-06-19 NOTE — ED Provider Notes (Signed)
[place] EMERGENCY DEPARTMENT AT [facility] Provider Note   CSN: [id number] Arrival date & time: 06/19/23  1239     History  Chief Complaint  Patient presents with   Groin Pain    [name] is a 82 y.o. female.  Patient with history of Paget's disease, vulvectomy performed at [facility] 05/14/2023 followed by another surgery for wound dehiscence, wound infection, hematoma on 8/15, treated with IV antibiotic Zosyn inpatient and transition to levofloxacin and metronidazole prior to discharge --  presents to the emergency department for intermittent lower abdominal pain.  Pain is spasm-like in nature and makes her feel like she has to urinate.  Urine became cloudy this morning.  When the waves of pain hit her she gets pain into her arms bilaterally.  No chest pain or shortness of breath.  No fevers.  Family states that they called plastic surgeon and was told to call EMS for transport to the hospital.  Patient is having difficulty sitting due to pain.  No blood noted in the urine.  No vomiting.  No unusual drainage or discharge.  Family performing wound care and dressing changes at home.  Patient had recent follow-up and had the bag changed out but the tubing was left in place per her report.       Home Medications Prior to Admission medications   Medication Sig Start Date End Date Taking? Authorizing Provider  acetaminophen (TYLENOL) 500 MG tablet Take 1,000 mg by mouth in the morning and at bedtime.    [provider]  folic acid (FOLVITE) 1 MG tablet Take 2 mg by mouth daily.    [provider]  hydroxychloroquine (PLAQUENIL) 200 MG tablet Take 200 mg by mouth 2 (two) times daily. 12/18/20   [provider]  losartan (COZAAR) 25 MG tablet Take 1 tablet by mouth once daily 03/30/23   Eden Emms, NP  predniSONE (DELTASONE) 5 MG tablet Take by mouth.    [provider]  Vitamin D, Cholecalciferol, 25 MCG (1000 UT) TABS Take 1 tablet by  mouth 2 (two) times daily.    [provider]  XARELTO 20 MG TABS tablet TAKE 1 TABLET BY MOUTH  DAILY WITH SUPPER 09/14/22   Serena Croissant, MD      Allergies    Codeine and Hydrocodone bit-homatrop mbr    Review of Systems   Review of Systems  Physical Exam Updated Vital Signs BP (!) 121/58 (BP Location: Right Arm)   Pulse 71   Temp 98.1 F (36.7 C) (Oral)   Resp 16   SpO2 96%   Physical Exam Vitals and nursing note reviewed.  Constitutional:      General: She is not in acute distress.    Appearance: She is well-developed.  HENT:     Head: Normocephalic and atraumatic.     Right Ear: External ear normal.     Left Ear: External ear normal.     Nose: Nose normal.  Eyes:     Conjunctiva/sclera: Conjunctivae normal.  Cardiovascular:     Rate and Rhythm: Normal rate and regular rhythm.     Heart sounds: No murmur heard. Pulmonary:     Effort: No respiratory distress.     Breath sounds: No wheezing, rhonchi or rales.  Abdominal:     Palpations: Abdomen is soft.     Tenderness: There is no abdominal tenderness. There is no guarding or rebound.  Genitourinary:    Comments: Foley catheter in place.  Urine  is cloudy. Musculoskeletal:     Cervical back: Normal range of motion and neck supple.     Right lower leg: No edema.     Left lower leg: No edema.  Skin:    General: Skin is warm and dry.     Findings: No rash.  Neurological:     General: No focal deficit present.     Mental Status: She is alert. Mental status is at baseline.     Motor: No weakness.  Psychiatric:        Mood and Affect: Mood normal.     ED Results / Procedures / Treatments   Labs (all labs ordered are listed, but only abnormal results are displayed) Labs Reviewed  CBC WITH DIFFERENTIAL/PLATELET - Abnormal; Notable for the following components:      Result Value   RBC 3.25 (*)    Hemoglobin 9.8 (*)    HCT 30.7 (*)    All other components within normal limits  BASIC METABOLIC PANEL     EKG None  Radiology No results found.  Procedures Procedures    Medications Ordered in ED Medications - No data to display  ED Course/ Medical Decision Making/ A&P    Patient seen and examined. History obtained directly from patient and family at bedside.  Labs/EKG: Ordered CBC, BMP.  Imaging: None ordered  Medications/Fluids: None ordered  Most recent vital signs reviewed and are as follows: BP (!) 121/58 (BP Location: Right Arm)   Pulse 71   Temp 98.1 F (36.7 C) (Oral)   Resp 16   SpO2 96%   Initial impression: Likely UTI in setting of indwelling Foley catheter.  Will need to determine if it is safe to change out the catheter has been for 5 weeks.  Number received from patient's family for plastic surgery office [phone] and currently awaiting callback from oncall plastic surgery.   2:22 PM Reassessment performed. Patient appears stable.  I spoke with resident Dr. Raphael Gibney of plastics with [facility].  He states not to remove the catheter as anatomy, postsurgery and procedure is very difficult and it would likely be a difficult exchange.  He recommends that this would need to be done by urology if necessary.  Labs personally reviewed and interpreted including: CBC with anemia, normal white blood cell count; BMP with normal kidney function  Reviewed pertinent lab work and imaging with patient at bedside. Questions answered.   Most current vital signs reviewed and are as follows: BP (!) 121/58 (BP Location: Right Arm)   Pulse 71   Temp 98.1 F (36.7 C) (Oral)   Resp 16   SpO2 96%   Plan: Patient suspected to have catheter related UTI.  She does not appear to have pyelonephritis and does not appear septic.  I think that she we will clinically need treated for UTI.  Will send UA and urine culture from sample and acknowledge it this is not likely to be a clean sample.  Based on up-to-date guidelines for complicated UTI with multidrug-resistant risk factors, will  give a dose of IV antibiotics, ertapenem, and likely discharged home on levofloxacin.  Patient was on oral levofloxacin for a period of time inpatient and tolerated.  I discussed with patient and family reasoning for not exchanging catheter today.  Discussed signs and symptoms which would necessitate further evaluation and be of a higher concern including fevers, flank pain, vomiting as this may indicate pyelonephritis.  Do not feel that she needs to be emergently transferred for  catheter exchange at this time unless symptoms worsen.  They are in agreement with plan.  3:46 PM Reassessment performed. Patient appears stable.  Foley continues to drain well.  Antibiotics are complete.  Most current vital signs reviewed and are as follows: BP (!) 150/65   Pulse 63   Temp 97.9 F (36.6 C) (Oral)   Resp 19   SpO2 95%   Plan: Discharge to home.   Prescriptions written for: Levofloxacin  Other home care instructions discussed: Monitoring of symptoms  ED return instructions discussed: Fever, worsening uncontrolled abdominal or flank pain, vomiting, new or worsening symptoms  Follow-up instructions discussed: Patient encouraged to follow-up with their PCP in 2 days.                                  Medical Decision Making Amount and/or Complexity of Data Reviewed Labs: ordered.   Patient with indwelling Foley catheter after groin surgery with suspected catheter related UTI.  I suspect this today due to bladder spasms and cloudy urine.  Urine sample was taken from the patient's current catheter and does have white cells and positive nitrite.  Clinically I suspect UTI.  I do not suspect sepsis or pyelonephritis.  Patient is high risk for multiresistant organisms given recent hospitalization and will require pseudomonal coverage.  Given this, she was given a dose of meropenem in the emergency room and will be discharged home on levofloxacin.  I obtained recommendations from pharmacy as well as the  patient's plastic surgery team.  She will need to be watched closely.  If she did develop signs of a more significant urine infection, she may need the catheter exchanged and admission for IV antibiotics, however recurrently she looks very well.        Final Clinical Impression(s) / ED Diagnoses Final diagnoses:  Urinary tract infection associated with indwelling urethral catheter, initial encounter [facility])  Bladder spasms    Rx / DC Orders ED Discharge Orders          Ordered    levofloxacin (LEVAQUIN) 750 MG tablet  Daily        06/19/23 1445              Renne Crigler, PA-C 06/19/23 1549    Benjiman Core, MD 06/20/23 [phone]

## 2023-06-19 NOTE — ED Triage Notes (Signed)
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 She also tells me she has had a foley cath. In since that date, which remains in, and is draining cloudy amber urine. She is here today for bladder area pain and she noted her urine to be cloudy. She is awake, alert and oriented x 4 with clear speech. Her son and daughter are with her.

## 2023-06-19 NOTE — Discharge Instructions (Signed)
Please read and follow all provided instructions.  Your diagnoses today include:  1. Urinary tract infection associated with indwelling urethral catheter, initial encounter (HCC)   2. Bladder spasms     Tests performed today include: Urine test - suggests that you have an infection in your bladder Blood cell counts and electrolytes are reassuring, your hemoglobin was a bit low and will need to be followed by your providers Vital signs. See below for your results today.   Medications prescribed:  Levofloxacin: Daily medication for UTI  Home care instructions:  Follow any educational materials contained in this packet.  Follow-up instructions: Please follow-up with your primary care provider or specialist in 2 days for symptom recheck.  Return instructions:  Please return to the Emergency Department if you experience worsening symptoms.  Return with fever, worsening pain, persistent vomiting, worsening pain in your back.  Please return if you have any other emergent concerns.  Additional Information:  Your vital signs today were: BP (!) 150/65   Pulse 63   Temp 97.9 F (36.6 C) (Oral)   Resp 19   SpO2 95%  If your blood pressure (BP) was elevated above 135/85 this visit, please have this repeated by your doctor within one month. --------------

## 2023-06-19 NOTE — ED Notes (Addendum)
Per PA Josh, collect urine sample from the pt's foley bag that is already in place. PA Josh stated the plastic surgeon  stated not to remove the present foley.

## 2023-06-19 NOTE — ED Notes (Signed)
[employer] for transport back to pt's home

## 2023-06-19 NOTE — ED Notes (Signed)
Patient verbalizes understanding of discharge instructions. Opportunity for questioning and answers were provided. Patient discharged from ED. IV removed x1

## 2023-06-22 LAB — URINE CULTURE: Culture: >=100000 — AB

## 2023-06-22 NOTE — Telephone Encounter (Signed)
Contacted HH and spoke to [place]. relayed information of the verbal orders to [facility] nurse. Understood and has no questions or concerns.

## 2023-06-23 ENCOUNTER — Encounter: Payer: Self-pay | Admitting: Nurse Practitioner

## 2023-06-23 ENCOUNTER — Encounter: Payer: Self-pay | Admitting: Psychiatry

## 2023-06-23 ENCOUNTER — Telehealth (HOSPITAL_BASED_OUTPATIENT_CLINIC_OR_DEPARTMENT_OTHER): Payer: Self-pay | Admitting: *Deleted

## 2023-06-23 MED ORDER — SERTRALINE HCL 50 MG PO TABS: 50.0000 mg | ORAL_TABLET | Freq: Every day | ORAL | 1 refills | Status: DC

## 2023-06-23 NOTE — Telephone Encounter (Signed)
Post ED Visit - Positive Culture Follow-up  Culture report reviewed by antimicrobial stewardship pharmacist: [facility] [x]  Ruben Im, Pharm.D. []  Celedonio Miyamoto, Pharm.D., BCPS AQ-ID []  Garvin Fila, Pharm.D., BCPS []  Georgina Pillion, Pharm.D., BCPS []  Helen, [address].D., BCPS, AAHIVP []  Estella Husk, Pharm.D., BCPS, AAHIVP []  Lysle Pearl, PharmD, BCPS []  Phillips Climes, PharmD, BCPS []  Agapito Games, PharmD, BCPS []  Verlan Friends, PharmD []  Mervyn Gay, PharmD, BCPS []  Vinnie Level, PharmD  [facility] []  Len Childs, PharmD []  Greer Pickerel, PharmD []  Adalberto Cole, PharmD []  Perlie Gold, Rph []  Lonell Face) Jean Rosenthal, PharmD []  Earl Many, PharmD []  Junita Push, PharmD []  Dorna Leitz, PharmD []  Terrilee Files, PharmD []  Lynann Beaver, PharmD []  Keturah Barre, PharmD []  Loralee Pacas, PharmD []  Bernadene Person, PharmD   Positive urine culture Treated with Levofloxacin, organism sensitive to the same and no further patient follow-up is required at this time.  Virl Axe [facility] 06/23/2023, 2:14 PM

## 2023-06-25 ENCOUNTER — Inpatient Hospital Stay: Payer: Medicare Other

## 2023-06-25 ENCOUNTER — Inpatient Hospital Stay: Payer: Medicare Other | Attending: Psychiatry | Admitting: Gynecologic Oncology

## 2023-06-25 VITALS — BP 126/56 | HR 68 | Temp 98.7°F | Resp 20 | Wt 190.0 lb

## 2023-06-25 DIAGNOSIS — N39498 Other specified urinary incontinence: Secondary | ICD-10-CM | POA: Insufficient documentation

## 2023-06-25 DIAGNOSIS — T83511D Infection and inflammatory reaction due to indwelling urethral catheter, subsequent encounter: Secondary | ICD-10-CM

## 2023-06-25 DIAGNOSIS — C519 Malignant neoplasm of vulva, unspecified: Secondary | ICD-10-CM

## 2023-06-25 DIAGNOSIS — N390 Urinary tract infection, site not specified: Secondary | ICD-10-CM

## 2023-06-25 LAB — URINALYSIS, COMPLETE (UACMP) WITH MICROSCOPIC
Bacteria, UA: NONE SEEN
Bilirubin Urine: NEGATIVE
Glucose, UA: NEGATIVE mg/dL
Ketones, ur: NEGATIVE mg/dL
Nitrite: NEGATIVE
Protein, ur: 30 mg/dL — AB
RBC / HPF: >50 RBC/hpf (ref 0–5)
Specific Gravity, Urine: 1.018 (ref 1.005–1.030)
WBC, UA: >50 WBC/hpf (ref 0–5)
pH: 5 (ref 5.0–8.0)

## 2023-06-25 NOTE — Progress Notes (Unsigned)
Patient presents today to the office for evaluation of urinary leakage around her Foley catheter.  She states that she has been doing well at home since surgery.  Her daughter is concerned about her decreased mobility related to the urinary leakage.  She was recently treated for a UTI with Levaquin and was seen in the emergency room.  She states she was sent in for bladder spasm that has helped some.  She continues to have moderate leakage around the catheter and is concerned that this may affect her vulvar wound healing.  No concerns voiced with the vulvar wound.  They are changing the dressing twice daily.  No abnormal discharge or increased redness or tenderness.  Patient states she is tolerating her diet with no nausea or emesis.  Bowels are functioning without difficulty.  No fever or chills reported.  Patient arrived to the office on the stretcher via EMS.  Vulva examined with no extensive erythema or excess drainage.  The existing 16 French Foley catheter with moderate sediment noted in the tubing.  With chaperone present, the Foley catheter was removed without difficulty.  With a significant amount of sediment noted at the tip of the catheter but not a complete blockage.  Given the leakage decision made to upsize Foley catheter to an 28 French catheter.  Foley catheter reinserted with an 1 [place] without difficulty.  Cloudy urine, yellow with sediment noted in tubing.  Urine sample obtained.  She does completed Levaquin yesterday.  She has follow-up with plastic surgery on October 9.

## 2023-06-25 NOTE — Patient Instructions (Signed)
Today we changed your foley catheter and increased the size of the tube to help with leakage.  Continue with the spasm medication as well.   We will send the urine sample from today to culture and analysis. We will contact you with the results.   If you start feeling poorly over the weekend, you can reach out to [facility] or our office after hours to [phone] and ask to speak with GYN Oncologist on call.

## 2023-06-26 LAB — URINE CULTURE: Culture: NO GROWTH

## 2023-06-28 ENCOUNTER — Telehealth: Payer: Self-pay | Admitting: *Deleted

## 2023-06-28 NOTE — Telephone Encounter (Signed)
Spoke with Ms. Cappa who states she is doing well. Her foley catheter is not leaking and it's draining well. Relayed message from Warner Mccreedy, NP that urine culture returned with no growth, no evidence of infection so sounds like the levaquin took care of the infection. Advised patient to monitor for any new symptoms since the catheter will be in for the next few weeks. Pt verbalized understanding and thanked the office for calling.

## 2023-06-28 NOTE — Telephone Encounter (Signed)
-----   Message from Doylene Bode sent at 06/28/2023  8:13 AM EDT ----- Please reach out to her and see how the weekend went. Urine culture returned with no growth, no evidence of infection so sounds like the levaquin took care of the infection. She will still need to monitor for any new symptoms since she still has a catheter in for the next few weeks.

## 2023-07-06 ENCOUNTER — Encounter: Payer: Self-pay | Admitting: Psychiatry

## 2023-07-07 ENCOUNTER — Encounter: Payer: Self-pay | Admitting: Psychiatry

## 2023-07-14 ENCOUNTER — Encounter (HOSPITAL_COMMUNITY): Disposition: A | Discharge status: Home / Self Care | Payer: Self-pay | Attending: Obstetrics & Gynecology

## 2023-07-14 ENCOUNTER — Telehealth: Payer: Self-pay

## 2023-07-14 ENCOUNTER — Other Ambulatory Visit: Payer: Self-pay

## 2023-07-14 ENCOUNTER — Inpatient Hospital Stay (HOSPITAL_COMMUNITY): Payer: Medicare Other | Admitting: Certified Registered Nurse Anesthetist

## 2023-07-14 ENCOUNTER — Ambulatory Visit: Payer: Medicare Other | Admitting: Gynecologic Oncology

## 2023-07-14 ENCOUNTER — Ambulatory Visit (HOSPITAL_COMMUNITY)
Admit: 2023-07-14 | Discharge: 2023-07-14 | Disposition: A | Discharge status: Home / Self Care | Payer: Medicare Other | Attending: Obstetrics & Gynecology | Admitting: Obstetrics & Gynecology

## 2023-07-14 VITALS — BP 100/70 | HR 79 | Resp 20

## 2023-07-14 DIAGNOSIS — N9989 Other postprocedural complications and disorders of genitourinary system: Secondary | ICD-10-CM | POA: Insufficient documentation

## 2023-07-14 DIAGNOSIS — Q525 Fusion of labia: Secondary | ICD-10-CM | POA: Diagnosis not present

## 2023-07-14 DIAGNOSIS — R339 Retention of urine, unspecified: Principal | ICD-10-CM | POA: Insufficient documentation

## 2023-07-14 DIAGNOSIS — Z9079 Acquired absence of other genital organ(s): Secondary | ICD-10-CM | POA: Insufficient documentation

## 2023-07-14 DIAGNOSIS — I2510 Atherosclerotic heart disease of native coronary artery without angina pectoris: Secondary | ICD-10-CM

## 2023-07-14 DIAGNOSIS — I10 Essential (primary) hypertension: Secondary | ICD-10-CM | POA: Diagnosis not present

## 2023-07-14 DIAGNOSIS — Z87891 Personal history of nicotine dependence: Secondary | ICD-10-CM | POA: Diagnosis not present

## 2023-07-14 DIAGNOSIS — Z7901 Long term (current) use of anticoagulants: Secondary | ICD-10-CM | POA: Diagnosis not present

## 2023-07-14 LAB — CBC
HCT: 34.1 % — ABNORMAL LOW (ref 36.0–46.0)
Hemoglobin: 10.5 g/dL — ABNORMAL LOW (ref 12.0–15.0)
MCH: 29.5 pg (ref 26.0–34.0)
MCHC: 30.8 g/dL (ref 30.0–36.0)
MCV: 95.8 fL (ref 80.0–100.0)
Platelets: 197 10*3/uL (ref 150–400)
RBC: 3.56 MIL/uL — ABNORMAL LOW (ref 3.87–5.11)
RDW: 13.3 % (ref 11.5–15.5)
WBC: 6.1 10*3/uL (ref 4.0–10.5)
nRBC: 0 % (ref 0.0–0.2)

## 2023-07-14 LAB — BASIC METABOLIC PANEL
Anion gap: 8 (ref 5–15)
BUN: 11 mg/dL (ref 8–23)
CO2: 25 mmol/L (ref 22–32)
Calcium: 8.7 mg/dL — ABNORMAL LOW (ref 8.9–10.3)
Chloride: 101 mmol/L (ref 98–111)
Creatinine, Ser: 0.64 mg/dL (ref 0.44–1.00)
GFR, Estimated: >60 mL/min (ref >60)
Glucose, Bld: 101 mg/dL — ABNORMAL HIGH (ref 70–99)
Potassium: 4.4 mmol/L (ref 3.5–5.1)
Sodium: 134 mmol/L — ABNORMAL LOW (ref 135–145)

## 2023-07-14 SURGERY — EXAM UNDER ANESTHESIA
Anesthesia: Monitor Anesthesia Care | Site: Urethra

## 2023-07-14 MED ORDER — POVIDONE-IODINE 10 % EX SWAB: 2.0000 | Freq: Once | CUTANEOUS | Status: DC

## 2023-07-14 MED ORDER — SILVER NITRATE-POT NITRATE 75-25 % EX MISC
CUTANEOUS | Status: DC | PRN
Start: 2023-07-14 — End: 2023-07-15
  Administered 2023-07-14: 1

## 2023-07-14 MED ORDER — ACETAMINOPHEN 10 MG/ML IV SOLN: 1000.0000 mg | Freq: Once | INTRAVENOUS | Status: DC | PRN

## 2023-07-14 MED ORDER — PROPOFOL 10 MG/ML IV BOLUS
INTRAVENOUS | Status: DC | PRN
  Administered 2023-07-14: 50 mg via INTRAVENOUS

## 2023-07-14 MED ORDER — SILVER NITRATE-POT NITRATE 75-25 % EX MISC
CUTANEOUS | Status: AC
  Filled 2023-07-14: qty 10

## 2023-07-14 MED ORDER — DROPERIDOL 2.5 MG/ML IJ SOLN: 0.6250 mg | Freq: Once | INTRAMUSCULAR | Status: DC | PRN

## 2023-07-14 MED ORDER — PROPOFOL 1000 MG/100ML IV EMUL
INTRAVENOUS | Status: AC
  Filled 2023-07-14: qty 100

## 2023-07-14 MED ORDER — PROPOFOL 500 MG/50ML IV EMUL
INTRAVENOUS | Status: DC | PRN
  Administered 2023-07-14: 100 ug/kg/min via INTRAVENOUS

## 2023-07-14 MED ORDER — LACTATED RINGERS IV SOLN: INTRAVENOUS | Status: DC | PRN

## 2023-07-14 MED ORDER — LIDOCAINE HCL (PF) 2 % IJ SOLN
INTRAMUSCULAR | Status: AC
  Filled 2023-07-14: qty 5

## 2023-07-14 MED ORDER — FENTANYL CITRATE PF 50 MCG/ML IJ SOSY: 25.0000 ug | PREFILLED_SYRINGE | INTRAMUSCULAR | Status: DC | PRN

## 2023-07-14 MED ORDER — TRAMADOL HCL 50 MG PO TABS
50.0000 mg | ORAL_TABLET | Freq: Two times a day (BID) | ORAL | 0 refills | Status: DC | PRN
Start: 2023-07-14 — End: 2023-09-01

## 2023-07-14 SURGICAL SUPPLY — 4 items
CATH FOLEY 2WAY SLVR 5CC 20FR (CATHETERS) IMPLANT
GAUZE PETROLATUM 1 X8 (GAUZE/BANDAGES/DRESSINGS) IMPLANT
PACK CYSTO (CUSTOM PROCEDURE TRAY) IMPLANT
TRAY FOLEY MTR SLVR 16FR STAT (SET/KITS/TRAYS/PACK) IMPLANT

## 2023-07-14 NOTE — Telephone Encounter (Signed)
[name]'s daughter called office stating her mom just left Dr.Damath's office. Catheter was removed. His recommendation for them was if she does not urinate within the next 6-8 hours of removal, she should follow up at our office for assistance.   Dr.Newton out of office, message sent to [facility] and Gypsy Balsam NP. I will call daughter back with advice.   Pt has follow up with [name] on 10/21

## 2023-07-14 NOTE — Anesthesia Postprocedure Evaluation (Signed)
Anesthesia Post Note  Patient: [name]  Procedure(s) Performed: Francia Greaves UNDER ANESTHESIA, CATHETER PLACEMENT. (Urethra)     Patient location during evaluation: PACU Anesthesia Type: MAC Level of consciousness: awake and alert Pain management: pain level controlled Vital Signs Assessment: post-procedure vital signs reviewed and stable Respiratory status: spontaneous breathing, nonlabored ventilation, respiratory function stable and patient connected to nasal cannula oxygen Cardiovascular status: stable and blood pressure returned to baseline Postop Assessment: no apparent nausea or vomiting Anesthetic complications: no   No notable events documented.  Last Vitals:  Vitals:   07/14/23 1906 07/14/23 2035  BP: (!) 176/66 (!) 149/67  Pulse: 74 65  Resp:  13  Temp: 36.8 C 36.6 C  SpO2: 97% 100%    Last Pain: There were no vitals filed for this visit.               [place]

## 2023-07-14 NOTE — Transfer of Care (Signed)
Immediate Anesthesia Transfer of Care Note  Patient: [name]  Procedure(s) Performed: Francia Greaves UNDER ANESTHESIA, CATHETER PLACEMENT. (Urethra)  Patient Location: PACU  Anesthesia Type:MAC  Level of Consciousness: awake and patient cooperative  Airway & Oxygen Therapy: Patient Spontanous Breathing and Patient connected to face mask  Post-op Assessment: Report given to RN and Post -op Vital signs reviewed and stable  Post vital signs: Reviewed and stable  Last Vitals:  Vitals Value Taken Time  BP    Temp    Pulse    Resp    SpO2      Last Pain: There were no vitals filed for this visit.       Complications: No notable events documented.

## 2023-07-14 NOTE — Telephone Encounter (Signed)
Lvm for Pt's daughter, Maryruth Hancock, to call office regarding message below. I also LVM on pt's phone for a return call.

## 2023-07-14 NOTE — Telephone Encounter (Signed)
I spoke to patient, she states since removal of catheter at 10:00am, she has only voided a few ounces x2.  Per Dr.Newton's note, I spoke to [place], pt's daughter, and she said she would get her brother to bring [name] to clinic for catheter reinsertion.

## 2023-07-14 NOTE — H&P (Signed)
[name] is an 82 y.o. female.   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 Following removal despite oral fluid challenges she has several, low volume voids.  She is medically complex and is chronically anticoagulated on a DOAC--she did not take the Xarelto today.   Past Medical History:  Diagnosis Date   Ambulates with cane    Antiphospholipid antibody syndrome (HCC)    Breast cancer    right   Chest discomfort on left    when lies down, she has to sit up   chronic sob on occasion    Coronary artery calcification seen on CT scan 04/15/2023   Dysrhythmia    PSVT/ palpitations-  controlled with prn Inderal   History of pulmonary embolism    2020 or 2021   Hyperlipidemia    statin intolerant   Hypertension    Interstitial lung disease (HCC)    Left arm numbness    at rest and with activity   Migraines    Obesity    Osteoarthritis    Personal history of radiation therapy 2020   right breast   Phlebitis of leg, right, superficial 1986/1989   x 2   RA (rheumatoid arthritis) (HCC)    Urinary incontinence     Past Surgical History:  Procedure Laterality Date   BOTOX INJECTION N/A 04/20/2023   Procedure: BOTOX INJECTION 100 UNITS;  Surgeon: Noel Christmas, MD;  Location: [facility];  Service: Urology;  Laterality: N/A;   BREAST BIOPSY Left 07/05/2019   BREAST BIOPSY Bilateral 08/24/2019   BREAST EXCISIONAL BIOPSY Left 09/20/2019   BREAST LUMPECTOMY Right 09/20/2019   BREAST LUMPECTOMY WITH RADIOACTIVE SEED LOCALIZATION Bilateral 09/20/2019   Procedure: BILATERAL BREAST LUMPECTOMIES WITH  BILATERAL RADIOACTIVE SEED LOCALIZATION AND RIGHT NIPPLE BIOPSY;  Surgeon: Harriette Bouillon, MD;  Location: [facility];  Service: General;  Laterality: Bilateral;   CARDIAC CATHETERIZATION  1990s   negative   CARDIOVASCULAR STRESS TEST  2008   NML   CARDIOVASCULAR STRESS TEST  02/02/2007   EF 70%, NO ISCHEMIA   CARPAL TUNNEL RELEASE Bilateral    CARPAL TUNNEL RELEASE Bilateral    CYSTOSCOPY WITH INJECTION N/A 04/20/2023   Procedure: CYSTOSCOPY WITH INJECTION OF Mariam Dollar;  Surgeon: Noel Christmas, MD;  Location: [facility] [place];  Service: Urology;  Laterality: N/A;  45 MINS   DILATION AND CURETTAGE, DIAGNOSTIC / THERAPEUTIC  2008   LESION DESTRUCTION  10/2017   Face; Dr. Yetta Barre   ROTATOR CUFF REPAIR  1999   Right, left in 2002   TOTAL KNEE ARTHROPLASTY  2008   Left   TOTAL KNEE ARTHROPLASTY  10/11/2012   Procedure: TOTAL KNEE ARTHROPLASTY;  Surgeon: Shelda Pal, MD;  Location: [facility];  Service: Orthopedics;  Laterality: Right;   US ECHOCARDIOGRAPHY  02/07/2007   EF 55-60%    Family History  Problem Relation Age of Onset   Hypertension Mother    Heart attack Father 7   Hypertension Father    Peptic Ulcer Disease Father    Heart defect Sister    Lung cancer Brother    Coronary artery disease Other  Female 1st degree relative <50   Colon cancer Neg Hx    Prostate cancer Neg Hx    Ovarian cancer Neg Hx    Pancreatic cancer Neg Hx    Endometrial cancer Neg Hx    Breast cancer Neg Hx    Social History:  reports that she quit smoking about 59 years ago. Her smoking use included cigarettes. She started smoking about 70 years ago. She has a 5.5 pack-year smoking history. She has never used smokeless tobacco. She reports that she does not drink alcohol and does not use drugs.  Allergies:  Allergies  Allergen Reactions   Codeine Nausea And Vomiting   Hydrocodone Bit-Homatrop Mbr Other (See Comments)    Hallucinations     No medications prior  to admission.    No results found for this or any previous visit (from the past 48 hour(s)). No results found.  Review of Systems  Genitourinary:  Negative for difficulty urinating.     Physical Exam  Pelvic: Agglutination of the labia in the midline.  Introitus admits < 1 FB  Assessment/Plan Difficulty voiding following vulvectomy w/significant scarring, distorted anatomy Medically complex--meds include a DOAC which she held today  >to OR for EUA, attempt to reinsert transurethral catheter and any indicated procedures.  Informed consent was obtained.  Questions were answered to the pt's stated satisfaction.    Antionette Char, MD 07/14/2023, 6:45 PM

## 2023-07-14 NOTE — Anesthesia Preprocedure Evaluation (Signed)
Anesthesia Evaluation  Patient identified by MRN, date of birth, ID band Patient awake    Reviewed: Allergy & Precautions, H&P , NPO status , Patient's Chart, lab work & pertinent test results  Airway Mallampati: II  TM Distance: >3 FB Neck ROM: Full    Dental no notable dental hx.    Pulmonary shortness of breath, former smoker ILD   Pulmonary exam normal breath sounds clear to auscultation       Cardiovascular hypertension, + CAD  Normal cardiovascular exam+ dysrhythmias Supra Ventricular Tachycardia  Rhythm:Regular Rate:Normal     Neuro/Psych  Headaches  Anxiety      negative psych ROS   GI/Hepatic Neg liver ROS,GERD  ,,  Endo/Other  negative endocrine ROS    Renal/GU negative Renal ROS  negative genitourinary   Musculoskeletal  (+) Arthritis ,    Abdominal   Peds negative pediatric ROS (+)  Hematology  (+) Blood dyscrasia, anemia   Anesthesia Other Findings   Reproductive/Obstetrics negative OB ROS                              Anesthesia Physical Anesthesia Plan  ASA: 3  Anesthesia Plan: MAC   Post-op Pain Management:    Induction: Intravenous  PONV Risk Score and Plan: Propofol infusion and Treatment may vary due to age or medical condition  Airway Management Planned: Natural Airway  Additional Equipment:   Intra-op Plan:   Post-operative Plan:   Informed Consent: I have reviewed the patients History and Physical, chart, labs and discussed the procedure including the risks, benefits and alternatives for the proposed anesthesia with the patient or authorized representative who has indicated his/her understanding and acceptance.     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 Following removal despite oral fluid challenges she has several, low volume voids.  She is medically complex and is chronically anticoagulated on a DOAC--she did not take the Xarelto today. ")         Anesthesia Quick Evaluation

## 2023-07-14 NOTE — Op Note (Signed)
Preoperative diagnosis: Urinary retention following vulvectomy  Postoperative diagnosis: Same  Procedure: Exam under anesthesia, transurethral catheter insertion  Surgeon: Antionette Char, MD  Anesthesia: Managed anesthesia care  Estimated blood loss: < 10 ml  Urine output: 50 nml  IV Fluids: per Anesthesiology  Complications: None  Specimen: N/A  Operative Findings: Agglutination of labia with narrow introital opening.  After blunt adhesiolysis the urethral meatus was palpated.    Description of procedure:   The patient was taken to the operating room and placed on the operating table in the semi-lithotomy position in [place] stirrups.  Examination under anesthesia was performed with findings noted as above.  The vaginal opening was probed digitally.  The urethral meatus was palpated and the catheter was inserted in a digital fashion.  Silver nitrate was applied to an abraded area.  Adequate hemostasis was noted.  A vaseline gauze was applied.  Final instrument counts were correct.  The patient was taken to the PACU in stable condition.

## 2023-07-14 NOTE — Discharge Instructions (Addendum)
  Medications:  - Take  tylenol first line for pain control. Take these regularly (every 6 hours) to decrease the build up of pain.  - If necessary, for severe pain  take tramadol.  - While taking tramadol you should take sennakot every night to reduce the likelihood of constipation. If this causes diarrhea, stop its use.  Diet: 1. Low sodium Heart Healthy Diet is recommended.  2. It is safe to use a laxative if you have difficulty moving your bowels.   Wound Care: 1. Keep clean and dry.  Shower daily. 2.  Apply vaseline twice daily for 3 days  Reasons to call the Doctor:  Fever - Oral temperature greater than 100.4 degrees Fahrenheit Foul-smelling vaginal discharge Difficulty urinating Nausea and vomiting Increased pain at the site of the incision that is unrelieved with pain medicine. Difficulty breathing with or without chest pain New calf pain especially if only on one side Sudden, continuing increased vaginal bleeding with or without clots.   Follow-up: 1. Please contact the office to schedule a follow-up appointment  Contacts: For questions or concerns you should contact:  Dr. Clide Cliff at [phone] After hours and on week-ends call [phone] and ask to speak to the physician on call for Gynecologic Oncology

## 2023-07-15 ENCOUNTER — Telehealth: Payer: Self-pay | Admitting: *Deleted

## 2023-07-15 ENCOUNTER — Encounter: Payer: Self-pay | Admitting: Psychiatry

## 2023-07-15 DIAGNOSIS — N9989 Other postprocedural complications and disorders of genitourinary system: Secondary | ICD-10-CM | POA: Diagnosis not present

## 2023-07-15 NOTE — Telephone Encounter (Signed)
Spoke with [name] this morning, she states her foley catheter is draining well, clear yellow urine. Pt states her son empty the catheter this morning and the bag was full.   Pt denies pelvic pain, fever and or chills. Pt is having some aches and pains in the vaginal area from where the catheter had to be placed back, other than that she is doing well.   Advised patient to call the office with any concerns or questions and she was just wondering what the next steps are in regards to how long the catheter will be in place. Pt states her next follow up with plastic surgeon is in three months. Pt's message would be relayed to provider and the office would call back with any new recommendations.

## 2023-07-15 NOTE — Telephone Encounter (Signed)
Attempted to reach Ms. Laconte in regards to her follow up appt. With [name] on Monday, October 21st at 2:30. Left voicemail requesting call back.

## 2023-07-16 ENCOUNTER — Telehealth: Payer: Self-pay | Admitting: *Deleted

## 2023-07-16 ENCOUNTER — Telehealth: Payer: Self-pay

## 2023-07-16 NOTE — Telephone Encounter (Signed)
No answer from [facility]. Will try again later.

## 2023-07-16 NOTE — Telephone Encounter (Signed)
Pt called the office and left a message stating she is aware of her follow up appointment with [name] on Monday, October 21st. Pt wants to know what the plan is about her foley catheter going forward because she is not happy about having it in.   Returned patient's call and spoke with [name] Pt's daughter and the patient. They both want to know the plan about the foley going forward. [name] states that Dr. Almedia Balls didn't say anything about the foley having to be placed back in and gave them impression that she would be able to empty her bladder without difficulty.  Advised them both that the patient's tissue around the urethra has obstructed the opening and it was difficult to place the foley back in and that's why it needed to be done under anesthesia and at this time the foley has to stay in and providers would evaluate at pt's future visits.   Pt was given a phone visit appt. With [name] on Monday, October 14th at 4 pm to discuss there concerns. [name] states that her brother, [name] would be the person that [name] should notify with the patient being present as his cell phone is the most reliable.

## 2023-07-16 NOTE — Telephone Encounter (Signed)
Patient did not feel like visit today. [name] is requesting verbal orders for  Nurse visit 1 week 1  Call back [phone]

## 2023-07-16 NOTE — Telephone Encounter (Signed)
Verbal order ok  

## 2023-07-19 ENCOUNTER — Inpatient Hospital Stay: Payer: Medicare Other | Attending: Psychiatry | Admitting: Psychiatry

## 2023-07-19 ENCOUNTER — Telehealth: Payer: Self-pay

## 2023-07-19 ENCOUNTER — Encounter: Payer: Self-pay | Admitting: Psychiatry

## 2023-07-19 DIAGNOSIS — R300 Dysuria: Secondary | ICD-10-CM | POA: Diagnosis not present

## 2023-07-19 DIAGNOSIS — C519 Malignant neoplasm of vulva, unspecified: Secondary | ICD-10-CM | POA: Diagnosis not present

## 2023-07-19 DIAGNOSIS — R319 Hematuria, unspecified: Secondary | ICD-10-CM | POA: Insufficient documentation

## 2023-07-19 DIAGNOSIS — Z7189 Other specified counseling: Secondary | ICD-10-CM

## 2023-07-19 DIAGNOSIS — Q525 Fusion of labia: Secondary | ICD-10-CM

## 2023-07-19 DIAGNOSIS — Z9079 Acquired absence of other genital organ(s): Secondary | ICD-10-CM | POA: Insufficient documentation

## 2023-07-19 DIAGNOSIS — R339 Retention of urine, unspecified: Secondary | ICD-10-CM

## 2023-07-19 MED ORDER — ESTRADIOL 0.1 MG/GM VA CREA: 1.0000 | TOPICAL_CREAM | Freq: Every day | VAGINAL | 1 refills | Status: DC

## 2023-07-19 NOTE — Progress Notes (Signed)
Gynecologic Oncology Telehealth Follow-Up Note  I connected with [name] on 07/19/23 at  4:00 PM EDT by telephone and verified that I am speaking with the correct person using two identifiers.  I discussed the limitations, risks, security and privacy concerns of performing an evaluation and management service by telemedicine and the availability of in-person appointments. I also discussed with the patient that there may be a patient responsible charge related to this service. The patient expressed understanding and agreed to proceed.  Other persons participating in the visit and their role in the encounter:  [name] (patient) Willadean Carol (son)  Patient's location: Home, [place] Provider's location: [facility]  Date of Service: 07/19/2023 Referring Provider: Mitchel Honour, DO   Assessment & Plan: [name] is a 82 y.o. 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 Almedia Balls who presents for follow-up discussion after recent foley removal and need for replacement in the OR on 07/14/23 after urinary retention.  Surgery on 07/14/2023 was completed by Dr. Tamela Oddi.  Reviewed risk factors for urinary retention including agglutination, long duration of Foley catheter use, prior bulking procedure for urinary incontinence.  As I was not at the surgical procedure on 07/14/2023, I cannot fully weigh in on plan regarding the labial agglutination.  However, estrogen may help allow for [place] to separate this in the future.  Recommend application of topical estrogen to the labia near the catheter insertion nightly until follow-up with me.  In terms of her bladder spasm, recommend continuing oxybutynin 5 mg twice daily as prescribed by Dr. Almedia Balls.  It sounds as though they have sufficient pills available but will let us know if she needs a refill.  In  terms of her hematuria, recommend that patient present tomorrow for urinalysis and urine culture.  Will prescribe antibiotics as needed pending results.  Big picture, for her urinary retention, reviewed possible outcomes including need for surgical improvement of agglutination versus possible suprapubic catheterization.  Will reach out to Dr. Arita Miss with urology to see if she has any other thoughts at this time.  Will plan backfill void trial at follow-up on 07/26/2023 so we know immediately if we need to replace catheter at that time to avoid overnight issues.  RTC 1 week.  Clide Cliff, MD Gynecologic Oncology   Medical Decision Making I personally spent  TOTAL 30 minutes via telephone in discussion with the patient on the date of service. The discussion of urinary retention, agglutination is beyond the scope of routine postoperative care.  ----------------------- Reason for Visit: Follow-up, urinary retention  Treatment History: Oncology History  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/27/2019 Initial Diagnosis   Patient reported two weeks of spontaneous right nipple discharge. 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 Biopsy on 07/05/19 showed fibrocystic changes in the left breast, no evidence of malignancy. Breast MRI on 08/11/19 showed a 1.7cm right nipple base mass, a 0.9cm superior central right breast mass, and indeterminate non-mass enhancement in the central left breast. Biopsy on 08/24/19 showed no evidence of malignancy in the left breast, and in the right breast, DCIS with necrosis, high grade, ER+ 100%, PR+ 70%.    09/20/2019 Surgery   Bilateral lumpectomies ([facility]): Left breast: complex sclerosing lesion with no  evidence of malignancy Right breast: nipple adenoma with intraductal papilloma and DCIS, intermediate grade, 0.4cm, clear  margins.   11/28/2019 -  Radiation Therapy   Adjuvant radiation     Interval History: Spoke on phone with patient and her son.  They note that she has been having pain after replacement of her catheter.  This is bladder spasms as well as discomfort from where the catheter is located in pulling.  They also note that there is blood in her urine with the urine looking like cherry colored.  She is moving better since the catheter was initially removed and replaced but feels it more when she bends over.  She has not taken the tramadol but could consider this.  She did not pick up the prescription.   Past Medical/Surgical History: Past Medical History:  Diagnosis Date   Ambulates with cane    Antiphospholipid antibody syndrome (HCC)    Breast cancer    right   Chest discomfort on left    when lies down, she has to sit up   chronic sob on occasion    Coronary artery calcification seen on CT scan 04/15/2023   Dysrhythmia    PSVT/ palpitations-  controlled with prn Inderal   History of pulmonary embolism    2020 or 2021   Hyperlipidemia    statin intolerant   Hypertension    Interstitial lung disease (HCC)    Left arm numbness    at rest and with activity   Migraines    Obesity    Osteoarthritis    Personal history of radiation therapy 2020   right breast   Phlebitis of leg, right, superficial 1986/1989   x 2   RA (rheumatoid arthritis) (HCC)    Urinary incontinence     Past Surgical History:  Procedure Laterality Date   BOTOX INJECTION N/A 04/20/2023   Procedure: BOTOX INJECTION 100 UNITS;  Surgeon: Noel Christmas, MD;  Location: [facility];  Service: Urology;  Laterality: N/A;   BREAST BIOPSY Left 07/05/2019   BREAST BIOPSY Bilateral 08/24/2019   BREAST EXCISIONAL BIOPSY Left 09/20/2019   BREAST LUMPECTOMY Right 09/20/2019   BREAST LUMPECTOMY WITH RADIOACTIVE SEED LOCALIZATION Bilateral 09/20/2019   Procedure: BILATERAL BREAST LUMPECTOMIES WITH BILATERAL  RADIOACTIVE SEED LOCALIZATION AND RIGHT NIPPLE BIOPSY;  Surgeon: Harriette Bouillon, MD;  Location:  SURGERY CENTER;  Service: General;  Laterality: Bilateral;   CARDIAC CATHETERIZATION  1990s   negative   CARDIOVASCULAR STRESS TEST  2008   NML   CARDIOVASCULAR STRESS TEST  02/02/2007   EF 70%, NO ISCHEMIA   CARPAL TUNNEL RELEASE Bilateral    CARPAL TUNNEL RELEASE Bilateral    CYSTOSCOPY WITH INJECTION N/A 04/20/2023   Procedure: CYSTOSCOPY WITH INJECTION OF Mariam Dollar;  Surgeon: Noel Christmas, MD;  Location: [facility];  Service: Urology;  Laterality: N/A;  45 MINS   DILATION AND CURETTAGE, DIAGNOSTIC / THERAPEUTIC  2008   LESION DESTRUCTION  10/2017   Face; Dr. Yetta Barre   ROTATOR CUFF REPAIR  1999   Right, left in 2002   TOTAL KNEE ARTHROPLASTY  2008   Left   TOTAL KNEE ARTHROPLASTY  10/11/2012   Procedure: TOTAL KNEE ARTHROPLASTY;  Surgeon: Shelda Pal, MD;  Location: [facility];  Service: Orthopedics;  Laterality: Right;   US ECHOCARDIOGRAPHY  02/07/2007   EF 55-60%    Family History  Problem Relation Age of Onset   Hypertension Mother    Heart attack Father 5  Hypertension Father    Peptic Ulcer Disease Father    Heart defect Sister    Lung cancer Brother    Coronary artery disease Other        Female 1st degree relative <50   Colon cancer Neg Hx    Prostate cancer Neg Hx    Ovarian cancer Neg Hx    Pancreatic cancer Neg Hx    Endometrial cancer Neg Hx    Breast cancer Neg Hx     Social History   Socioeconomic History   Marital status: Married    Spouse name: Not on file   Number of children: Not on file   Years of education: Not on file   Highest education level: Not on file  Occupational History   Occupation: Retired  Tobacco Use   Smoking status: Former    Current packs/day: 0.00    Average packs/day: 0.5 packs/day for 11.0 years (5.5 ttl pk-yrs)    Types: Cigarettes    Start date: 64    Quit date: 10/06/1963    Years since  quitting: 59.8   Smokeless tobacco: Never  Vaping Use   Vaping status: Never Used  Substance and Sexual Activity   Alcohol use: No    Comment: stopped 5 years ago   Drug use: No   Sexual activity: Yes  Other Topics Concern   Not on file  Social History Narrative   Regular exercise: yes walks 1 mile a day   Diet: loves butter, fruit and veggies   Social Determinants of Health   Financial Resource Strain: Low Risk  (05/14/2023)   Received from [facility]   Overall Financial Resource Strain (CARDIA)    Difficulty of Paying Living Expenses: Not hard at all  Food Insecurity: No Food Insecurity (05/14/2023)   Received from [facility]   Hunger Vital Sign    Worried About Running Out of Food in the Last Year: Never true    Ran Out of Food in the Last Year: Never true  Transportation Needs: No Transportation Needs (06/11/2023)   Received from [facility]   PRAPARE - Transportation    Lack of Transportation (Medical): No    Lack of Transportation (Non-Medical): No  Physical Activity: Sufficiently Active (02/25/2022)   Exercise Vital Sign    Days of Exercise per Week: 7 days    Minutes of Exercise per Session: 60 min  Stress: No Stress Concern Present (02/25/2022)   [employer] of Occupational Health - Occupational Stress Questionnaire    Feeling of Stress : Not at all  Social Connections: Not on file    Current Medications:  Current Outpatient Medications:    acetaminophen (TYLENOL) 500 MG tablet, Take 1,000 mg by mouth in the morning and at bedtime., Disp: , Rfl:    folic acid (FOLVITE) 1 MG tablet, Take 2 mg by mouth daily., Disp: , Rfl:    hydroxychloroquine (PLAQUENIL) 200 MG tablet, Take 200 mg by mouth 2 (two) times daily., Disp: , Rfl:    losartan (COZAAR) 25 MG tablet, Take 1 tablet by mouth once daily, Disp: 90 tablet, Rfl: 0   sertraline (ZOLOFT) 50 MG tablet, Take 1 tablet (50 mg total) by mouth daily., Disp: 30 tablet, Rfl: 1   traMADol (ULTRAM) 50 MG  tablet, Take 1 tablet (50 mg total) by mouth every 12 (twelve) hours as needed for up to 4 doses., Disp: 4 tablet, Rfl: 0   Vitamin D, Cholecalciferol, 25 MCG (1000 UT) TABS,  Take 1 tablet by mouth 2 (two) times daily., Disp: , Rfl:    XARELTO 20 MG TABS tablet, TAKE 1 TABLET BY MOUTH  DAILY WITH SUPPER, Disp: 90 tablet, Rfl: 3 No current facility-administered medications for this visit.  Facility-Administered Medications Ordered in Other Visits:    regadenoson (LEXISCAN) injection SOLN 0.4 mg, 0.4 mg, Intravenous, Once, Hilty, Lisette Abu, MD   technetium tetrofosmin (TC-MYOVIEW) injection 32.7 millicurie, 32.7 millicurie, Intravenous, Once PRN, Hilty, Lisette Abu, MD  Review of Symptoms: Pertinent positives as per HPI.  Physical Exam: Deferred given limitations of phone visit.  Laboratory & Radiologic Studies: none

## 2023-07-19 NOTE — Telephone Encounter (Signed)
Called Lillia Abed gave verbal will call if any questions.

## 2023-07-19 NOTE — Telephone Encounter (Signed)
Transition Care Management Follow-up Telephone Call Date of discharge and from where: [facility] 9/14 How have you been since you were released from the hospital? Doing well and following up with providers  Any questions or concerns? No  Items Reviewed: Did the pt receive and understand the discharge instructions provided? Yes  Medications obtained and verified? Yes  Other? No  Any new allergies since your discharge? No  Dietary orders reviewed? No Do you have support at home? Yes    Follow up appointments reviewed:  PCP Hospital f/u appt confirmed? Yes  Scheduled to see  on  @ . Specialist Hospital f/u appt confirmed? Yes  Scheduled to see  on  @ . Are transportation arrangements needed? No  If their condition worsens, is the pt aware to call PCP or go to the Emergency Dept.? Yes Was the patient provided with contact information for the PCP's office or ED? Yes Was to pt encouraged to call back with questions or concerns? Yes

## 2023-07-20 ENCOUNTER — Other Ambulatory Visit: Payer: Self-pay | Admitting: Gynecologic Oncology

## 2023-07-20 ENCOUNTER — Ambulatory Visit: Payer: Medicare Other

## 2023-07-20 ENCOUNTER — Telehealth: Payer: Self-pay

## 2023-07-20 ENCOUNTER — Inpatient Hospital Stay (HOSPITAL_BASED_OUTPATIENT_CLINIC_OR_DEPARTMENT_OTHER): Payer: Medicare Other | Admitting: Gynecologic Oncology

## 2023-07-20 VITALS — BP 120/75 | HR 71 | Temp 99.0°F | Resp 20 | Ht 62.0 in | Wt 200.0 lb

## 2023-07-20 DIAGNOSIS — C519 Malignant neoplasm of vulva, unspecified: Secondary | ICD-10-CM | POA: Diagnosis present

## 2023-07-20 DIAGNOSIS — Z9079 Acquired absence of other genital organ(s): Secondary | ICD-10-CM | POA: Diagnosis not present

## 2023-07-20 DIAGNOSIS — R310 Gross hematuria: Secondary | ICD-10-CM

## 2023-07-20 DIAGNOSIS — Z978 Presence of other specified devices: Secondary | ICD-10-CM

## 2023-07-20 DIAGNOSIS — R319 Hematuria, unspecified: Secondary | ICD-10-CM | POA: Diagnosis not present

## 2023-07-20 DIAGNOSIS — Q525 Fusion of labia: Secondary | ICD-10-CM

## 2023-07-20 LAB — URINALYSIS, COMPLETE (UACMP) WITH MICROSCOPIC
Bilirubin Urine: NEGATIVE
Glucose, UA: NEGATIVE mg/dL
Ketones, ur: NEGATIVE mg/dL
Nitrite: NEGATIVE
Protein, ur: 100 mg/dL — AB
RBC / HPF: >50 RBC/hpf (ref 0–5)
Specific Gravity, Urine: 1.016 (ref 1.005–1.030)
WBC, UA: >50 WBC/hpf (ref 0–5)
pH: 5 (ref 5.0–8.0)

## 2023-07-20 MED ORDER — ESTRADIOL 0.1 MG/GM VA CREA
TOPICAL_CREAM | VAGINAL | 2 refills | Status: DC
Start: 2023-07-20 — End: 2023-09-01

## 2023-07-20 NOTE — Progress Notes (Signed)
Patient seen here today in clinic accompanied by daughter Florentina Addison for urinalysis and culture. Pt has indwelling foley catheter draining well with clear yellow urine with slight tea colored. Pt denies any discomfort other than occasional bladder spasms.   Foley catheter pinched off at upper catheter insertion while patient was able to drink water. After several minutes sample of urine obtained with syringe and sent for urinalysis and culture. Pt and daughter advised that office would call with results.

## 2023-07-20 NOTE — Telephone Encounter (Signed)
Fax received from [facility] pharmacy regarding recent Rx sent in by [name] on Estradiol (Estrace vaginal) 0.1mg /gm vaginal cream.  Directions state "place 1 applicatorful vaginally at bedtime. Pharmacy needs clarification of grams per applicatorful?   [name] out of office today. Faxed placed for Warner Mccreedy NP to review.

## 2023-07-21 ENCOUNTER — Telehealth: Payer: Self-pay | Admitting: *Deleted

## 2023-07-21 LAB — URINE CULTURE: Culture: <10000 — AB

## 2023-07-21 NOTE — Telephone Encounter (Signed)
Spoke with Ms. [name] and relayed message from Warner Mccreedy, NP that there was insignificant growth with the urinary culture. No evidence of infection on culture. Continue to stay well hydrated, prevent pulling of the catheter and we will see you in the office on Monday, October 21 st. Pt verbalized  understanding and thanked the office for calling.

## 2023-07-21 NOTE — Telephone Encounter (Signed)
-----   Message from Doylene Bode sent at 07/21/2023 11:11 AM EDT ----- Please let her know there was insignificant growth with the urinary culture. No evidence of infection on culture. Continue to stay hydrated, prevent pulling of the catheter. See [place] in the office on Monday ----- Message ----- From: Interface, [facility] In [place] Sent: 07/20/2023  12:52 PM EDT To: Doylene Bode, NP

## 2023-07-26 ENCOUNTER — Inpatient Hospital Stay: Payer: Medicare Other | Admitting: Psychiatry

## 2023-07-26 VITALS — BP 119/54 | HR 72 | Temp 98.7°F | Resp 20 | Wt 205.4 lb

## 2023-07-26 DIAGNOSIS — Z978 Presence of other specified devices: Secondary | ICD-10-CM

## 2023-07-26 DIAGNOSIS — R339 Retention of urine, unspecified: Secondary | ICD-10-CM

## 2023-07-26 DIAGNOSIS — C519 Malignant neoplasm of vulva, unspecified: Secondary | ICD-10-CM

## 2023-07-26 DIAGNOSIS — Q525 Fusion of labia: Secondary | ICD-10-CM

## 2023-07-26 NOTE — Progress Notes (Unsigned)
Gynecologic Oncology Return Clinic Visit  Date of Service: 07/26/2023 Referring Provider: Mitchel Honour, DO  Plastic Surgeon: Dr. Almedia Balls  Assessment & Plan: [name] is a 82 y.o. 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 Almedia Balls who presents for follow-up after recent foley removal and need for replacement in the OR on 07/14/23 after urinary retention.   Postop: - Overall recovering well. - Well-healed vulva with no signs or symptoms of infection.  Urinary retention: -Reviewed risk factors for urinary retention include long duration with indwelling Foley catheter as well as possible risk of obstructive process from agglutination of vulva. -Recommend placement of suprapubic catheter which both will alleviate irritation from urethral catheter as well as allow for easier management of at trial voids spontaneously with ability to relieve retention with suprapubic catheter. - Additionally recommend vaginal dilation and estrogen to help resolve vulvar agglutination which could contribute to urinary retention from obstructive process. - Patient and daughter would like to trial voiding prior to placement of suprapubic catheter.  Recommend that she do at least 1 week of vaginal dilation and estrogen prior to this attempt. - Will have patient return in 1 week for a formal backfill void trial.  She should use vaginal estrogen and dilator every other day or every third day until next week.  Dilator provided. - Will request appointment for VIR suprapubic catheter placement in case patient fails formal void trial.  Vulvar Paget's disease: -Pathology without any evidence of invasive carcinoma. - Positive margins as was expected. - Reviewed that if patient becomes symptomatic again in the future could consider topical treatment with  imiquimod. - Will plan initially every 3 month surveillance.  RTC 1 week for void trial.  Rachel Cliff, MD Gynecologic Oncology    ----------------------- Reason for Visit: Postop  Treatment History: Oncology History  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/27/2019 Initial Diagnosis   Patient reported two weeks of spontaneous right nipple discharge. 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 Biopsy on 07/05/19 showed fibrocystic changes in the left breast, no evidence of malignancy. Breast MRI on 08/11/19 showed a 1.7cm right nipple base mass, a 0.9cm superior central right breast mass, and indeterminate non-mass enhancement in the central left breast. Biopsy on 08/24/19 showed no evidence of malignancy in the left breast, and in the right breast, DCIS with necrosis, high grade, ER+ 100%, PR+ 70%.    09/20/2019 Surgery   Bilateral lumpectomies ([facility]): Left breast: complex sclerosing lesion with no evidence of malignancy Right breast: nipple adenoma with intraductal papilloma and DCIS, intermediate grade, 0.4cm, clear margins.   11/28/2019 -  Radiation Therapy   Adjuvant radiation     Interval History: Patient presents with her daughter.  Was previously seen in follow-up by Dr. Almedia Balls.  Overall healing well but significant irritation from urethral catheter.  Had it removed at plastic surgery office but then was unable to void.  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  She desires to have the catheter removed.  No longer having irritation otherwise of the vulva as was occurring prior to surgery.  Past Medical/Surgical History: Past Medical History:  Diagnosis Date   Ambulates with cane    Antiphospholipid antibody  syndrome (HCC)    Breast cancer    right   Chest discomfort on left    when lies down, she has to sit up   chronic sob on occasion    Coronary artery calcification seen on CT scan 04/15/2023   Dysrhythmia    PSVT/ palpitations-  controlled with prn Inderal   History of pulmonary embolism    2020 or 2021   Hyperlipidemia    statin intolerant   Hypertension    Interstitial lung disease (HCC)    Left arm numbness    at rest and with activity   Migraines    Obesity    Osteoarthritis    Personal history of radiation therapy 2020   right breast   Phlebitis of leg, right, superficial 1986/1989   x 2   RA (rheumatoid arthritis) (HCC)    Urinary incontinence     Past Surgical History:  Procedure Laterality Date   BOTOX INJECTION N/A 04/20/2023   Procedure: BOTOX INJECTION 100 UNITS;  Surgeon: Noel Christmas, MD;  Location: [facility];  Service: Urology;  Laterality: N/A;   BREAST BIOPSY Left 07/05/2019   BREAST BIOPSY Bilateral 08/24/2019   BREAST EXCISIONAL BIOPSY Left 09/20/2019   BREAST LUMPECTOMY Right 09/20/2019   BREAST LUMPECTOMY WITH RADIOACTIVE SEED LOCALIZATION Bilateral 09/20/2019   Procedure: BILATERAL BREAST LUMPECTOMIES WITH BILATERAL RADIOACTIVE SEED LOCALIZATION AND RIGHT NIPPLE BIOPSY;  Surgeon: Harriette Bouillon, MD;  Location: [facility];  Service: General;  Laterality: Bilateral;   CARDIAC CATHETERIZATION  1990s   negative   CARDIOVASCULAR STRESS TEST  2008   NML   CARDIOVASCULAR STRESS TEST  02/02/2007   EF 70%, NO ISCHEMIA   CARPAL TUNNEL RELEASE Bilateral    CARPAL TUNNEL RELEASE Bilateral    CYSTOSCOPY WITH INJECTION N/A 04/20/2023   Procedure: CYSTOSCOPY WITH INJECTION OF Mariam Dollar;  Surgeon: Noel Christmas, MD;  Location: [facility];  Service: Urology;  Laterality: N/A;  45 MINS   DILATION AND CURETTAGE, DIAGNOSTIC / THERAPEUTIC  2008   LESION DESTRUCTION  10/2017   Face; Dr. Yetta Barre   ROTATOR CUFF  REPAIR  1999   Right, left in 2002   TOTAL KNEE ARTHROPLASTY  2008   Left   TOTAL KNEE ARTHROPLASTY  10/11/2012   Procedure: TOTAL KNEE ARTHROPLASTY;  Surgeon: Shelda Pal, MD;  Location: [facility];  Service: Orthopedics;  Laterality: Right;   US ECHOCARDIOGRAPHY  02/07/2007   EF 55-60%    Family History  Problem Relation Age of Onset   Hypertension Mother    Heart attack Father 70   Hypertension Father    Peptic Ulcer Disease Father    Heart defect Sister    Lung cancer Brother    Coronary artery disease Other        Female 1st degree relative <50   Colon cancer Neg Hx    Prostate cancer Neg Hx    Ovarian cancer Neg Hx    Pancreatic cancer Neg Hx    Endometrial cancer Neg Hx    Breast cancer Neg Hx     Social History   Socioeconomic History   Marital status: Married    Spouse name: Not on file   Number of children: Not on file   Years of education: Not on file   Highest education level: Not on file  Occupational History  Occupation: Retired  Tobacco Use   Smoking status: Former    Current packs/day: 0.00    Average packs/day: 0.5 packs/day for 11.0 years (5.5 ttl pk-yrs)    Types: Cigarettes    Start date: 32    Quit date: 10/06/1963    Years since quitting: 59.8   Smokeless tobacco: Never  Vaping Use   Vaping status: Never Used  Substance and Sexual Activity   Alcohol use: No    Comment: stopped 5 years ago   Drug use: No   Sexual activity: Yes  Other Topics Concern   Not on file  Social History Narrative   Regular exercise: yes walks 1 mile a day   Diet: loves butter, fruit and veggies   Social Determinants of Health   Financial Resource Strain: Low Risk  (05/14/2023)   Received [facility]   Overall Financial Resource Strain (CARDIA)    Difficulty of Paying Living Expenses: Not hard at all  Food Insecurity: No Food Insecurity (05/14/2023)   Received from [facility]   Hunger Vital Sign    Worried About Running Out of Food in the Last  Year: Never true    Ran Out of Food in the Last Year: Never true  Transportation Needs: No Transportation Needs (06/11/2023)   Received from [facility]   PRAPARE - Transportation    Lack of Transportation (Medical): No    Lack of Transportation (Non-Medical): No  Physical Activity: Sufficiently Active (02/25/2022)   Exercise Vital Sign    Days of Exercise per Week: 7 days    Minutes of Exercise per Session: 60 min  Stress: No Stress Concern Present (02/25/2022)   [employer] of Occupational [employer] - Occupational Stress Questionnaire    Feeling of Stress : Not at all  Social Connections: Not on file    Current Medications:  Current Outpatient Medications:    acetaminophen (TYLENOL) 500 MG tablet, Take 1,000 mg by mouth in the morning and at bedtime., Disp: , Rfl:    estradiol (ESTRACE VAGINAL) 0.1 MG/GM vaginal cream, Use vaginal estrogen cream nightly at bedtime for 2 weeks then three times a week after to assist with healing of the vulva. You will need to use a fingertip size amount of cream on your finger and place on the vulva, around the catheter insertion site. Do not use the applicator if this comes with the cream., Disp: 42.5 g, Rfl: 2   folic acid (FOLVITE) 1 MG tablet, Take 2 mg by mouth daily., Disp: , Rfl:    hydroxychloroquine (PLAQUENIL) 200 MG tablet, Take 200 mg by mouth 2 (two) times daily., Disp: , Rfl:    losartan (COZAAR) 25 MG tablet, Take 1 tablet by mouth once daily, Disp: 90 tablet, Rfl: 0   sertraline (ZOLOFT) 50 MG tablet, Take 1 tablet (50 mg total) by mouth daily., Disp: 30 tablet, Rfl: 1   traMADol (ULTRAM) 50 MG tablet, Take 1 tablet (50 mg total) by mouth every 12 (twelve) hours as needed for up to 4 doses., Disp: 4 tablet, Rfl: 0   Vitamin D, Cholecalciferol, 25 MCG (1000 UT) TABS, Take 1 tablet by mouth 2 (two) times daily., Disp: , Rfl:    XARELTO 20 MG TABS tablet, TAKE 1 TABLET BY MOUTH  DAILY WITH SUPPER, Disp: 90 tablet, Rfl: 3 No current  facility-administered medications for this visit.  Facility-Administered Medications Ordered in Other Visits:    regadenoson (LEXISCAN) injection SOLN 0.4 mg, 0.4 mg, Intravenous, Once,  Chrystie Nose, MD   technetium tetrofosmin (TC-MYOVIEW) injection 32.7 millicurie, 32.7 millicurie, Intravenous, Once PRN, Hilty, Lisette Abu, MD  Review of Symptoms: Complete 10-system review is positive for: Joint pain, diarrhea, back pain  Physical Exam: BP (!) 119/54 (BP Location: Left Arm, Patient Position: Sitting) Comment: Notified RN Recheck First was 133/56  Pulse 72   Temp 98.7 F (37.1 C) (Oral)   Resp 20   Wt 205 lb 6.4 oz (93.2 kg)   SpO2 96%   BMI 37.57 kg/m  General: Alert, oriented, no acute distress. HEENT: Normocephalic, atraumatic. Neck symmetric without masses. Sclera anicteric.  Chest: Normal work of breathing. Clear to auscultation bilaterally.   Cardiovascular: Regular rate and rhythm, no murmurs. Abdomen: Soft, nontender.   Extremities: Grossly normal range of motion.  Warm, well perfused.  Skin: No rashes or lesions noted. GU: External genitalia overall well-healed from prior vulvectomy.  Small vaginal opening with some granulation tissue present.  Limited visualization of urethral opening but able to pass a fingertip through the introitus.  Extra small vaginal dilator placed in vagina successfully for approximately 5 minutes and removed.  Silver nitrate applied to friable granulation tissue. Exam chaperoned by Warner Mccreedy, NP   Laboratory & Radiologic Studies: Surgical pathology (05/13/23): Diagnosis   A: Right vaginal margin, biopsy - Positive for extramammary Paget's disease   B: Left vaginal margin, biopsy - Positive for extramammary Paget's disease   C: Left posterior subcutaneous nodule, excision - Fibroadipose tissue with fat necrosis, lipogranulomas, and hemosiderin-laden macrophages (see comment) - No involvement by Paget's disease or carcinoma identified    D: Vulva, total vulvectomy - Extramammary Paget's disease, size at least 20.0 cm, involving bilateral labia majora, bilateral labia minora, perineum, and bilateral gluteal / perianal skin with extension into adnexal structures  - Surgical margins are extensively positive, including: - Bilateral superior labial margins - Left lateral labial margin - Bilateral internal labia minora margins - Bilateral internal gluteal / perineal margins - Right gluteal tip margin - Additional close margins:               - Right lateral labial margin, 1 mm away               - Right lateral gluteal margin, less than 1 mm away              - Left lateral gluteal margin, 1.5 mm away              - Left gluteal tip margin, 2 mm away - No invasive carcinoma identified - Additional findings: Changes suggestive of lichen sclerosus, focal intradermal nevus - See synoptic report and comment   E: Vulva nodule margin, excision - Fibroadipose tissue and skeletal muscle - No involvement by Paget's disease or carcinoma identified

## 2023-07-26 NOTE — Patient Instructions (Addendum)
Today [name] discussed two options:  1) She recommends using the vaginal dilator provided at your clinic visit every other day or every three days with the vaginal estrogen. Leave the dilator in the vagina for at least 15 minutes. If it feels looser or more comfortable, you can upsize to a small size from extra small. You can use the premarin cream on the tip of the dilator and or use the topical lidocaine if there is discomfort. We will plan on seeing you back in the office on Monday for a voiding trial. This is where we will instill normal saline through the catheter into the bladder, remove the catheter, and see if you can urinate on your own.   2) The other option discussed was placing a suprapubic catheter in the bladder which is a catheter that goes through the lower abdomen into the bladder. This would allow for removal of the catheter coming out of your urethra and to help decrease inflammation in that area. You would then continue with the dilators and premarin cream. You would be able to clamp the suprapubic and attempt to urinate. If unable to urinate, you would unclamp the catheter to let the urine run out into the bag. This is a process of retraining your bladder.   Given the fact you have had a catheter in for a longer period of time, sometimes the bladder has to be retrained on how to function.   We will go ahead and place the order for the suprapubic catheter so this can be scheduled shortly after your appointment next week if you cannot urinate.

## 2023-07-28 ENCOUNTER — Encounter: Payer: Self-pay | Admitting: Psychiatry

## 2023-07-28 NOTE — Progress Notes (Unsigned)
Sterling Big, MD  Claude[facility] Kinds; [place] [facility] Procedure Requests Cc: Caroleen Hamman, NT Approved for SPT placement.  HKM

## 2023-07-30 NOTE — Progress Notes (Unsigned)
Loa Socks, NP  [facility] Kinds [place] [facility].  It is ok to hold xarelto x 2 days.       Previous Messages    ----- Message ----- From: Claudean Kinds Sent: 07/28/2023   1:58 PM EDT To: Serena Croissant, MD; [facility] Kinds Subject: Blood thinner hold                            Good afternoon ,   Above patient will have procedure on 08/09/23.  And will need to [facility] hold for 2 days ( 2 doses )  - do we have your permission to hold ?   Thanks , Saliha Salts X.

## 2023-08-02 ENCOUNTER — Inpatient Hospital Stay (HOSPITAL_BASED_OUTPATIENT_CLINIC_OR_DEPARTMENT_OTHER): Payer: Medicare Other | Admitting: Gynecologic Oncology

## 2023-08-02 VITALS — BP 126/73 | HR 84 | Temp 97.9°F | Resp 20 | Wt 207.7 lb

## 2023-08-02 DIAGNOSIS — Z9889 Other specified postprocedural states: Secondary | ICD-10-CM

## 2023-08-02 DIAGNOSIS — R339 Retention of urine, unspecified: Secondary | ICD-10-CM

## 2023-08-02 DIAGNOSIS — Z978 Presence of other specified devices: Secondary | ICD-10-CM

## 2023-08-02 DIAGNOSIS — Q525 Fusion of labia: Secondary | ICD-10-CM | POA: Diagnosis not present

## 2023-08-02 NOTE — Patient Instructions (Signed)
Today we attempted foley removal and voiding trial. Due to being unable to urinate, we replaced the catheter. The recommendation is to proceed as scheduled with the suprapubic catheter placement.  1) We continue to recommend using the vaginal dilator provided at your clinic visit every other day or every three days with the vaginal estrogen. Do this once daily. Leave the dilator in the vagina for at least 15 minutes. If it feels looser or more comfortable, you can upsize to a small size from extra small. You can use the premarin cream on the tip of the dilator daily and or use the topical lidocaine if there is discomfort.    2) A suprapubic catheter in the bladder is a catheter that goes through the lower abdomen/pubic area into the bladder. This would allow for removal of the catheter coming out of your urethra and to help decrease inflammation in that area. You would then continue with the dilators and premarin cream. You would be able to clamp the suprapubic and attempt to urinate. If unable to urinate, you would unclamp the catheter to let the urine run out into the bag. This is a process of retraining your bladder.

## 2023-08-02 NOTE — Progress Notes (Signed)
Gynecologic Oncology Return Clinic Visit  Date of Service: 08/02/2023 Referring Provider: Mitchel Honour, DO  Plastic Surgeon: Dr. Almedia Balls  Assessment & Plan: [name] is a 82 y.o. 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 Almedia Balls who presents for follow-up, possible foley removal with a backfill voiding trial.  Postop: - Overall recovering well. - Well-healed vulva with no signs or symptoms of infection.  Urinary retention: -Voiding trial unsuccessful today. Foley catheter replaced.  -Recommend proceeding with placement of suprapubic catheter which both will alleviate irritation from urethral catheter as well as allow for easier management of at trial voids spontaneously with ability to relieve retention with suprapubic catheter. - Additionally continue to recommend vaginal dilation and estrogen to help resolve vulvar agglutination which could contribute to urinary retention from obstructive process.  From last visit with [name]: Vulvar Paget's disease: -Pathology without any evidence of invasive carcinoma. - Positive margins as was expected. - Reviewed that if patient becomes symptomatic again in the future could consider topical treatment with imiquimod. - Will plan initially every 3 month surveillance.  Rachel Mccreedy NP Gynecologic Oncology  ----------------------- Reason for Visit: Foley removal, active voiding trial  Treatment History: Oncology History  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/27/2019 Initial Diagnosis   Patient reported two weeks of spontaneous right nipple discharge. 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 Biopsy on 07/05/19 showed fibrocystic  changes in the left breast, no evidence of malignancy. Breast MRI on 08/11/19 showed a 1.7cm right nipple base mass, a 0.9cm superior central right breast mass, and indeterminate non-mass enhancement in the central left breast. Biopsy on 08/24/19 showed no evidence of malignancy in the left breast, and in the right breast, DCIS with necrosis, high grade, ER+ 100%, PR+ 70%.    09/20/2019 Surgery   Bilateral lumpectomies ([facility]): Left breast: complex sclerosing lesion with no evidence of malignancy Right breast: nipple adenoma with intraductal papilloma and DCIS, intermediate grade, 0.4cm, clear margins.   11/28/2019 -  Radiation Therapy   Adjuvant radiation     Interval History: Patient presents with her daughter. She reports doing well at home overall. She would like to have the foley removed today to help decrease vulvar irritation. Tolerating diet with no nausea or emesis She has been scheduled for the suprapubic catheter placement with IR if voiding trial today failed. She has been using the vaginal dilators at home.  Past Medical/Surgical History: Past Medical History:  Diagnosis Date   Ambulates with cane    Antiphospholipid antibody syndrome (HCC)    Breast cancer    right   Chest discomfort on left    when lies down, she has to sit up   chronic sob on occasion    Coronary artery calcification seen on CT scan 04/15/2023   Dysrhythmia    PSVT/ palpitations-  controlled with prn Inderal   History of pulmonary embolism    2020 or 2021   Hyperlipidemia    statin intolerant   Hypertension    Interstitial lung disease (HCC)    Left arm numbness    at rest and with activity   Migraines    Obesity    Osteoarthritis    Paget disease of breast (  HCC) 05/06/2023   Personal history of radiation therapy 2020   right breast   Phlebitis of leg, right, superficial 1986/1989   x 2   RA (rheumatoid arthritis) (HCC)    Urinary incontinence     Past Surgical History:  Procedure  Laterality Date   BOTOX INJECTION N/A 04/20/2023   Procedure: BOTOX INJECTION 100 UNITS;  Surgeon: Noel Christmas, MD;  Location: [facility];  Service: Urology;  Laterality: N/A;   BREAST BIOPSY Left 07/05/2019   BREAST BIOPSY Bilateral 08/24/2019   BREAST EXCISIONAL BIOPSY Left 09/20/2019   BREAST LUMPECTOMY Right 09/20/2019   BREAST LUMPECTOMY WITH RADIOACTIVE SEED LOCALIZATION Bilateral 09/20/2019   Procedure: BILATERAL BREAST LUMPECTOMIES WITH BILATERAL RADIOACTIVE SEED LOCALIZATION AND RIGHT NIPPLE BIOPSY;  Surgeon: Harriette Bouillon, MD;  Location: [facility];  Service: General;  Laterality: Bilateral;   CARDIAC CATHETERIZATION  1990s   negative   CARDIOVASCULAR STRESS TEST  2008   NML   CARDIOVASCULAR STRESS TEST  02/02/2007   EF 70%, NO ISCHEMIA   CARPAL TUNNEL RELEASE Bilateral    CARPAL TUNNEL RELEASE Bilateral    CYSTOSCOPY WITH INJECTION N/A 04/20/2023   Procedure: CYSTOSCOPY WITH INJECTION OF Mariam Dollar;  Surgeon: Noel Christmas, MD;  Location: [facility];  Service: Urology;  Laterality: N/A;  45 MINS   DILATION AND CURETTAGE, DIAGNOSTIC / THERAPEUTIC  2008   LESION DESTRUCTION  10/2017   Face; Dr. Yetta Barre   ROTATOR CUFF REPAIR  1999   Right, left in 2002   TOTAL KNEE ARTHROPLASTY  2008   Left   TOTAL KNEE ARTHROPLASTY  10/11/2012   Procedure: TOTAL KNEE ARTHROPLASTY;  Surgeon: Shelda Pal, MD;  Location: [facility];  Service: Orthopedics;  Laterality: Right;   US ECHOCARDIOGRAPHY  02/07/2007   EF 55-60%    Family History  Problem Relation Age of Onset   Hypertension Mother    Heart attack Father 4   Hypertension Father    Peptic Ulcer Disease Father    Heart defect Sister    Lung cancer Brother    Coronary artery disease Other        Female 1st degree relative <50   Colon cancer Neg Hx    Prostate cancer Neg Hx    Ovarian cancer Neg Hx    Pancreatic cancer Neg Hx    Endometrial cancer Neg Hx    Breast cancer Neg  Hx    BRCA 1/2 Neg Hx     Social History   Socioeconomic History   Marital status: Married    Spouse name: Not on file   Number of children: Not on file   Years of education: Not on file   Highest education level: Not on file  Occupational History   Occupation: Retired  Tobacco Use   Smoking status: Former    Current packs/day: 0.00    Average packs/day: 0.5 packs/day for 11.0 years (5.5 ttl pk-yrs)    Types: Cigarettes    Start date: 2    Quit date: 10/06/1963    Years since quitting: 59.8   Smokeless tobacco: Never  Vaping Use   Vaping status: Never Used  Substance and Sexual Activity   Alcohol use: No    Comment: stopped 5 years ago   Drug use: No   Sexual activity: Yes  Other Topics Concern   Not on file  Social History Narrative   Regular exercise: yes walks 1 mile a day   Diet: loves butter,  fruit and veggies   Social Determinants of Health   Financial Resource Strain: Low Risk  (05/14/2023)   Received from [facility]   Overall Financial Resource Strain (CARDIA)    Difficulty of Paying Living Expenses: Not hard at all  Food Insecurity: No Food Insecurity (05/14/2023)   Received from [facility]   Hunger Vital Sign    Worried About Running Out of Food in the Last Year: Never true    Ran Out of Food in the Last Year: Never true  Transportation Needs: No Transportation Needs (06/11/2023)   Received from [facility] - Transportation    Lack of Transportation (Medical): No    Lack of Transportation (Non-Medical): No  Physical Activity: Sufficiently Active (02/25/2022)   Exercise Vital Sign    Days of Exercise per Week: 7 days    Minutes of Exercise per Session: 60 min  Stress: No Stress Concern Present (02/25/2022)   [employer] of Occupational Health - Occupational Stress Questionnaire    Feeling of Stress : Not at all  Social Connections: Not on file    Current Medications:  Current Outpatient Medications:    acetaminophen  (TYLENOL) 500 MG tablet, Take 1,000 mg by mouth in the morning and at bedtime., Disp: , Rfl:    estradiol (ESTRACE VAGINAL) 0.1 MG/GM vaginal cream, Use vaginal estrogen cream nightly at bedtime for 2 weeks then three times a week after to assist with healing of the vulva. You will need to use a fingertip size amount of cream on your finger and place on the vulva, around the catheter insertion site. Do not use the applicator if this comes with the cream., Disp: 42.5 g, Rfl: 2   folic acid (FOLVITE) 1 MG tablet, Take 2 mg by mouth daily., Disp: , Rfl:    hydroxychloroquine (PLAQUENIL) 200 MG tablet, Take 200 mg by mouth 2 (two) times daily., Disp: , Rfl:    losartan (COZAAR) 25 MG tablet, Take 1 tablet by mouth once daily, Disp: 90 tablet, Rfl: 0   sertraline (ZOLOFT) 50 MG tablet, Take 1 tablet (50 mg total) by mouth daily., Disp: 30 tablet, Rfl: 1   traMADol (ULTRAM) 50 MG tablet, Take 1 tablet (50 mg total) by mouth every 12 (twelve) hours as needed for up to 4 doses., Disp: 4 tablet, Rfl: 0   Vitamin D, Cholecalciferol, 25 MCG (1000 UT) TABS, Take 1 tablet by mouth 2 (two) times daily., Disp: , Rfl:    XARELTO 20 MG TABS tablet, TAKE 1 TABLET BY MOUTH  DAILY WITH SUPPER, Disp: 90 tablet, Rfl: 3 No current facility-administered medications for this visit.  Facility-Administered Medications Ordered in Other Visits:    regadenoson (LEXISCAN) injection SOLN 0.4 mg, 0.4 mg, Intravenous, Once, Hilty, Lisette Abu, MD   technetium tetrofosmin (TC-MYOVIEW) injection 32.7 millicurie, 32.7 millicurie, Intravenous, Once PRN, Hilty, Lisette Abu, MD  Review of Symptoms: Complete 10-system review is positive for: +back pain, mild rectal bleeding  Physical Exam: BP 126/73 (BP Location: Left Arm, Patient Position: Sitting)   Pulse 84   Temp 97.9 F (36.6 C) (Oral)   Resp 20   Wt 207 lb 11.2 oz (94.2 kg)   SpO2 96%   BMI 37.99 kg/m  General: Alert, oriented, no acute distress. HEENT: Normocephalic,  atraumatic. Neck symmetric without masses. Sclera anicteric.  Chest: Normal work of breathing.  Extremities: Grossly normal range of motion.  Warm, well perfused.  Skin: No rashes or lesions noted. GU:  External genitalia overall well-healed from prior vulvectomy. Small vaginal opening with limited visualization of the urethral opening. Vulva assessed by [name] prior to backfill voiding trial.  -At 09:30: 200 cc of NS instilled in the bladder via the catheter. Patient tolerated this well but could not tolerate additional fluid  -At 09:47: pt unable to void significant amount, leaking intermittently with bladder spasms Foley was replaced by [name].    Laboratory & Radiologic Studies: Surgical pathology (05/13/23): Diagnosis   A: Right vaginal margin, biopsy - Positive for extramammary Paget's disease   B: Left vaginal margin, biopsy - Positive for extramammary Paget's disease   C: Left posterior subcutaneous nodule, excision - Fibroadipose tissue with fat necrosis, lipogranulomas, and hemosiderin-laden macrophages (see comment) - No involvement by Paget's disease or carcinoma identified   D: Vulva, total vulvectomy - Extramammary Paget's disease, size at least 20.0 cm, involving bilateral labia majora, bilateral labia minora, perineum, and bilateral gluteal / perianal skin with extension into adnexal structures  - Surgical margins are extensively positive, including: - Bilateral superior labial margins - Left lateral labial margin - Bilateral internal labia minora margins - Bilateral internal gluteal / perineal margins - Right gluteal tip margin - Additional close margins:               - Right lateral labial margin, 1 mm away               - Right lateral gluteal margin, less than 1 mm away              - Left lateral gluteal margin, 1.5 mm away              - Left gluteal tip margin, 2 mm away - No invasive carcinoma identified - Additional findings: Changes suggestive of  lichen sclerosus, focal intradermal nevus - See synoptic report and comment   E: Vulva nodule margin, excision - Fibroadipose tissue and skeletal muscle - No involvement by Paget's disease or carcinoma identified

## 2023-08-06 ENCOUNTER — Other Ambulatory Visit (HOSPITAL_COMMUNITY): Payer: Self-pay | Admitting: Student

## 2023-08-06 DIAGNOSIS — R339 Retention of urine, unspecified: Secondary | ICD-10-CM

## 2023-08-06 NOTE — H&P (Signed)
Chief Complaint: Urinary retention  Referring Provider(s): Clide Cliff  Supervising Physician: Richarda Overlie  Patient Status: [facility] - Out[facility]  History of Present Illness: [name] is a 82 y.o. female  with noninvasive Paget's disease of the vulva.  She is s/p total vulvectomy (Gyn Onc) and complex closure (Plastics) on 05/13/23.  She developed a hematoma and had to be taken back to the OR for debridement on 05/20/23 with Dr. Almedia Balls.  She developed urinary retention.   [name] recommend placement of suprapubic catheter which both will alleviate irritation from urethral catheter as well as allow for easier management of trial voids   She wanted to attempt voiding trial first which was done 08/02/23 but she was unsuccessful.  She is here today for placement of a suprapubic catheter.  She is NPO. No nausea/vomiting. No Fever/chills. ROS negative.  Patient is Full Code  Past Medical History:  Diagnosis Date   Ambulates with cane    Antiphospholipid antibody syndrome (HCC)    Breast cancer    right   Chest discomfort on left    when lies down, she has to sit up   chronic sob on occasion    Coronary artery calcification seen on CT scan 04/15/2023   Dysrhythmia    PSVT/ palpitations-  controlled with prn Inderal   History of pulmonary embolism    2020 or 2021   Hyperlipidemia    statin intolerant   Hypertension    Interstitial lung disease (HCC)    Left arm numbness    at rest and with activity   Migraines    Obesity    Osteoarthritis    Personal history of radiation therapy 2020   right breast   Phlebitis of leg, right, superficial 1986/1989   x 2   RA (rheumatoid arthritis) (HCC)    Urinary incontinence     Past Surgical History:  Procedure Laterality Date   BOTOX INJECTION N/A 04/20/2023   Procedure: BOTOX INJECTION 100 UNITS;  Surgeon: Noel Christmas, MD;  Location: [facility] [place];  Service: Urology;  Laterality: N/A;    BREAST BIOPSY Left 07/05/2019   BREAST BIOPSY Bilateral 08/24/2019   BREAST EXCISIONAL BIOPSY Left 09/20/2019   BREAST LUMPECTOMY Right 09/20/2019   BREAST LUMPECTOMY WITH RADIOACTIVE SEED LOCALIZATION Bilateral 09/20/2019   Procedure: BILATERAL BREAST LUMPECTOMIES WITH BILATERAL RADIOACTIVE SEED LOCALIZATION AND RIGHT NIPPLE BIOPSY;  Surgeon: Harriette Bouillon, MD;  Location: [facility];  Service: General;  Laterality: Bilateral;   CARDIAC CATHETERIZATION  1990s   negative   CARDIOVASCULAR STRESS TEST  2008   NML   CARDIOVASCULAR STRESS TEST  02/02/2007   EF 70%, NO ISCHEMIA   CARPAL TUNNEL RELEASE Bilateral    CARPAL TUNNEL RELEASE Bilateral    CYSTOSCOPY WITH INJECTION N/A 04/20/2023   Procedure: CYSTOSCOPY WITH INJECTION OF Mariam Dollar;  Surgeon: Noel Christmas, MD;  Location: [facility];  Service: Urology;  Laterality: N/A;  45 MINS   DILATION AND CURETTAGE, DIAGNOSTIC / THERAPEUTIC  2008   LESION DESTRUCTION  10/2017   Face; Dr. Yetta Barre   ROTATOR CUFF REPAIR  1999   Right, left in 2002   TOTAL KNEE ARTHROPLASTY  2008   Left   TOTAL KNEE ARTHROPLASTY  10/11/2012   Procedure: TOTAL KNEE ARTHROPLASTY;  Surgeon: Shelda Pal, MD;  Location: [facility];  Service: Orthopedics;  Laterality: Right;   US ECHOCARDIOGRAPHY  02/07/2007   EF 55-60%    Allergies: Codeine  and Hydrocodone bit-homatrop mbr  Medications: Prior to Admission medications   Medication Sig Start Date End Date Taking? Authorizing Provider  acetaminophen (TYLENOL) 500 MG tablet Take 1,000 mg by mouth in the morning and at bedtime.    [provider]  estradiol (ESTRACE VAGINAL) 0.1 MG/GM vaginal cream Use vaginal estrogen cream nightly at bedtime for 2 weeks then three times a week after to assist with healing of the vulva. You will need to use a fingertip size amount of cream on your finger and place on the vulva, around the catheter insertion site. Do not use the applicator if this  comes with the cream. 07/20/23   Cross, Efraim Kaufmann D, NP  folic acid (FOLVITE) 1 MG tablet Take 2 mg by mouth daily.    [provider]  hydroxychloroquine (PLAQUENIL) 200 MG tablet Take 200 mg by mouth 2 (two) times daily. 12/18/20   [provider]  losartan (COZAAR) 25 MG tablet Take 1 tablet by mouth once daily 03/30/23   Eden Emms, NP  sertraline (ZOLOFT) 50 MG tablet Take 1 tablet (50 mg total) by mouth daily. 06/23/23   Eden Emms, NP  traMADol (ULTRAM) 50 MG tablet Take 1 tablet (50 mg total) by mouth every 12 (twelve) hours as needed for up to 4 doses. 07/14/23   Antionette Char, MD  Vitamin D, Cholecalciferol, 25 MCG (1000 UT) TABS Take 1 tablet by mouth 2 (two) times daily.    [provider]  XARELTO 20 MG TABS tablet TAKE 1 TABLET BY MOUTH  DAILY WITH SUPPER 09/14/22   Serena Croissant, MD     Family History  Problem Relation Age of Onset   Hypertension Mother    Heart attack Father 22   Hypertension Father    Peptic Ulcer Disease Father    Heart defect Sister    Lung cancer Brother    Coronary artery disease Other        Female 1st degree relative <50   Colon cancer Neg Hx    Prostate cancer Neg Hx    Ovarian cancer Neg Hx    Pancreatic cancer Neg Hx    Endometrial cancer Neg Hx    Breast cancer Neg Hx     Social History   Socioeconomic History   Marital status: Married    Spouse name: Not on file   Number of children: Not on file   Years of education: Not on file   Highest education level: Not on file  Occupational History   Occupation: Retired  Tobacco Use   Smoking status: Former    Current packs/day: 0.00    Average packs/day: 0.5 packs/day for 11.0 years (5.5 ttl pk-yrs)    Types: Cigarettes    Start date: 70    Quit date: 10/06/1963    Years since quitting: 59.8   Smokeless tobacco: Never  Vaping Use   Vaping status: Never Used  Substance and Sexual Activity   Alcohol use: No    Comment: stopped 5 years ago   Drug  use: No   Sexual activity: Yes  Other Topics Concern   Not on file  Social History Narrative   Regular exercise: yes walks 1 mile a day   Diet: loves butter, fruit and veggies   Social Determinants of Health   Financial Resource Strain: Low Risk  (05/14/2023)   Received from [facility]   Overall Financial Resource Strain (CARDIA)    Difficulty of Paying Living Expenses: Not hard  at all  Food Insecurity: No Food Insecurity (05/14/2023)   Received from [facility]   Hunger Vital Sign    Worried About Running Out of Food in the Last Year: Never true    Ran Out of Food in the Last Year: Never true  Transportation Needs: No Transportation Needs (06/11/2023)   Received from [facility] - Transportation    Lack of Transportation (Medical): No    Lack of Transportation (Non-Medical): No  Physical Activity: Sufficiently Active (02/25/2022)   Exercise Vital Sign    Days of Exercise per Week: 7 days    Minutes of Exercise per Session: 60 min  Stress: No Stress Concern Present (02/25/2022)   [employer] of Occupational Health - Occupational Stress Questionnaire    Feeling of Stress : Not at all  Social Connections: Not on file     Review of Systems: A 12 point ROS discussed and pertinent positives are indicated in the HPI above.  All other systems are negative.  Review of Systems  Vital Signs: There were no vitals taken for this visit.  Advance Care Plan: The advanced care place/surrogate decision maker was discussed at the time of visit and the patient did not wish to discuss or was not able to name a surrogate decision maker or provide an advance care plan.  Physical Exam Vitals reviewed.  Constitutional:      Appearance: Normal appearance.  HENT:     Head: Normocephalic and atraumatic.  Eyes:     Extraocular Movements: Extraocular movements intact.  Cardiovascular:     Rate and Rhythm: Normal rate and regular rhythm.  Pulmonary:     Effort: Pulmonary  effort is normal. No respiratory distress.     Breath sounds: Normal breath sounds.  Abdominal:     Palpations: Abdomen is soft.  Musculoskeletal:        General: Normal range of motion.     Cervical back: Normal range of motion.  Skin:    General: Skin is warm and dry.  Neurological:     General: No focal deficit present.     Mental Status: She is alert and oriented to person, place, and time.  Psychiatric:        Mood and Affect: Mood normal.        Behavior: Behavior normal.        Thought Content: Thought content normal.        Judgment: Judgment normal.     Imaging: No results found.  Labs:  CBC: Recent Labs    04/20/23 1101 06/19/23 1324 07/14/23 2107  WBC  --  6.7 6.1  HGB 13.9 9.8* 10.5*  HCT 41.0 30.7* 34.1*  PLT  --  228 197    COAGS: No results for input(s): "INR", "APTT" in the last 8760 hours.  BMP: Recent Labs    04/16/23 0825 04/20/23 1101 06/19/23 1324 07/14/23 2107  NA 142 141 133* 134*  K 4.6 3.9 4.0 4.4  CL 102 104 100 101  CO2 25  --  25 25  GLUCOSE 100* 95 110* 101*  BUN 17 14 9 11   CALCIUM 9.3  --  8.2* 8.7*  CREATININE 0.78 0.70 0.54 0.64  GFRNONAA  --   --  >60 >60    LIVER FUNCTION TESTS: No results for input(s): "BILITOT", "AST", "ALT", "ALKPHOS", "PROT", "ALBUMIN" in the last 8760 hours.  TUMOR MARKERS: No results for input(s): "AFPTM", "CEA", "CA199", "CHROMGRNA" in the last 8760 hours.  Assessment and Plan:  Urninary retention after vulvectomy.  Will proceed with placement of a suprapubic catheter today by Dr. Lowella Dandy.  Risks and benefits of SP tube placement were discussed with the patient including bleeding, infection, damage to adjacent structures, fistula connection, and sepsis.  All of the patient's questions were answered, patient is agreeable to proceed. Consent signed and in chart.  Thank you for allowing our service to participate in [name] 's care.  Electronically Signed: Gwynneth Macleod,  PA-C   08/06/2023, 3:55 PM      I spent a total of  30 Minutes  in face to face in clinical consultation, greater than 50% of which was counseling/coordinating care for SP tube placement.

## 2023-08-09 ENCOUNTER — Encounter (HOSPITAL_COMMUNITY): Payer: Self-pay

## 2023-08-09 ENCOUNTER — Other Ambulatory Visit: Payer: Self-pay

## 2023-08-09 ENCOUNTER — Ambulatory Visit (HOSPITAL_COMMUNITY)
Admission: RE | Admit: 2023-08-09 | Discharge: 2023-08-09 | Disposition: A | Discharge status: Home / Self Care | Payer: Medicare Other | Source: Ambulatory Visit | Attending: Gynecologic Oncology | Admitting: Gynecologic Oncology

## 2023-08-09 DIAGNOSIS — C519 Malignant neoplasm of vulva, unspecified: Secondary | ICD-10-CM | POA: Diagnosis not present

## 2023-08-09 DIAGNOSIS — R339 Retention of urine, unspecified: Secondary | ICD-10-CM | POA: Insufficient documentation

## 2023-08-09 DIAGNOSIS — Q525 Fusion of labia: Secondary | ICD-10-CM

## 2023-08-09 DIAGNOSIS — Z923 Personal history of irradiation: Secondary | ICD-10-CM | POA: Insufficient documentation

## 2023-08-09 DIAGNOSIS — Z978 Presence of other specified devices: Secondary | ICD-10-CM

## 2023-08-09 LAB — CBC
HCT: 35.7 % — ABNORMAL LOW (ref 36.0–46.0)
Hemoglobin: 11.2 g/dL — ABNORMAL LOW (ref 12.0–15.0)
MCH: 28.2 pg (ref 26.0–34.0)
MCHC: 31.4 g/dL (ref 30.0–36.0)
MCV: 89.9 fL (ref 80.0–100.0)
Platelets: 319 10*3/uL (ref 150–400)
RBC: 3.97 MIL/uL (ref 3.87–5.11)
RDW: 13.2 % (ref 11.5–15.5)
WBC: 6.9 10*3/uL (ref 4.0–10.5)
nRBC: 0 % (ref 0.0–0.2)

## 2023-08-09 LAB — PROTIME-INR
INR: 1 (ref 0.8–1.2)
Prothrombin Time: 13.2 s (ref 11.4–15.2)

## 2023-08-09 MED ORDER — MIDAZOLAM HCL 2 MG/2ML IJ SOLN
INTRAMUSCULAR | Status: AC | PRN
  Administered 2023-08-09 (×2): .5 mg via INTRAVENOUS
  Administered 2023-08-09: 1 mg via INTRAVENOUS

## 2023-08-09 MED ORDER — FENTANYL CITRATE (PF) 100 MCG/2ML IJ SOLN
INTRAMUSCULAR | Status: AC
  Filled 2023-08-09: qty 2

## 2023-08-09 MED ORDER — FENTANYL CITRATE (PF) 100 MCG/2ML IJ SOLN
INTRAMUSCULAR | Status: AC | PRN
  Administered 2023-08-09: 25 ug via INTRAVENOUS
  Administered 2023-08-09: 50 ug via INTRAVENOUS
  Administered 2023-08-09: 25 ug via INTRAVENOUS

## 2023-08-09 MED ORDER — MIDAZOLAM HCL 2 MG/2ML IJ SOLN
INTRAMUSCULAR | Status: AC
  Filled 2023-08-09: qty 2

## 2023-08-09 MED ORDER — SODIUM CHLORIDE 0.9 % IV SOLN: INTRAVENOUS | Status: DC

## 2023-08-09 MED ORDER — LIDOCAINE HCL 1 % IJ SOLN: 10.0000 mL | Freq: Once | INTRAMUSCULAR | Status: DC

## 2023-08-09 NOTE — Progress Notes (Signed)
Foley catheter clamped for suprapubic catheter placement.

## 2023-08-09 NOTE — Procedures (Signed)
  Procedure:  CT suprapubic catheter placement 23f Preprocedure diagnosis: Diagnoses of Foley catheter in place, Labia minora agglutination, and Urinary retention were pertinent to this visit. Postprocedure diagnosis: same EBL:    minimal Complications:   none immediate  See full dictation in [employer].  Thora Lance MD Main # [phone] Pager  [phone] Mobile [phone]

## 2023-08-09 NOTE — Progress Notes (Signed)
Patient and daughter was given discharge instructions. Both verbalized understanding. 

## 2023-08-10 ENCOUNTER — Ambulatory Visit
Admission: RE | Admit: 2023-08-10 | Discharge: 2023-08-10 | Disposition: A | Discharge status: Home / Self Care | Payer: Medicare Other | Source: Ambulatory Visit | Attending: Hematology and Oncology

## 2023-08-10 DIAGNOSIS — Z0000 Encounter for general adult medical examination without abnormal findings: Secondary | ICD-10-CM

## 2023-08-13 ENCOUNTER — Other Ambulatory Visit: Payer: Self-pay | Admitting: Hematology and Oncology

## 2023-08-13 DIAGNOSIS — R928 Other abnormal and inconclusive findings on diagnostic imaging of breast: Secondary | ICD-10-CM

## 2023-08-16 ENCOUNTER — Encounter: Payer: Self-pay | Admitting: Hematology and Oncology

## 2023-08-16 ENCOUNTER — Telehealth: Payer: Self-pay

## 2023-08-16 NOTE — Telephone Encounter (Signed)
Sent high priorty to scheduling to reschedule appt. Pt has an appt. On 11/18 she has to go for new scans on 11/21 at the breast center. Could you please cancel the appt. On 11/18 and schedule new appt. 3 days after her scans on 11/21. Can you please call pt with new scheduled appt day and time.

## 2023-08-23 ENCOUNTER — Ambulatory Visit: Payer: Medicare Other | Admitting: Hematology and Oncology

## 2023-08-26 ENCOUNTER — Ambulatory Visit
Admission: RE | Admit: 2023-08-26 | Discharge: 2023-08-26 | Disposition: A | Discharge status: Home / Self Care | Payer: Medicare Other | Source: Ambulatory Visit | Attending: Hematology and Oncology | Admitting: Hematology and Oncology

## 2023-08-26 ENCOUNTER — Encounter: Payer: Self-pay | Admitting: Psychiatry

## 2023-08-26 ENCOUNTER — Ambulatory Visit: Admission: RE | Admit: 2023-08-26 | Payer: Medicare Other | Source: Ambulatory Visit

## 2023-08-26 DIAGNOSIS — R928 Other abnormal and inconclusive findings on diagnostic imaging of breast: Secondary | ICD-10-CM

## 2023-08-27 ENCOUNTER — Telehealth: Payer: Self-pay | Admitting: *Deleted

## 2023-08-27 NOTE — Telephone Encounter (Signed)
Spoke with patient after leaving a message through MyChart. Pt is wearing a leg bag during the day, but it over-flows at night. Pt advised to call [facility] to obtain a larger bag for use at night. Pt verbalized understanding and thanked the office for calling back.

## 2023-09-01 ENCOUNTER — Ambulatory Visit (INDEPENDENT_AMBULATORY_CARE_PROVIDER_SITE_OTHER): Payer: Medicare Other | Admitting: Primary Care

## 2023-09-01 ENCOUNTER — Ambulatory Visit (HOSPITAL_BASED_OUTPATIENT_CLINIC_OR_DEPARTMENT_OTHER): Payer: Medicare Other | Admitting: Internal Medicine

## 2023-09-01 ENCOUNTER — Encounter: Payer: Self-pay | Admitting: Primary Care

## 2023-09-01 ENCOUNTER — Ambulatory Visit: Payer: Medicare Other

## 2023-09-01 VITALS — BP 124/72 | HR 75 | Temp 98.0°F | Ht 62.0 in | Wt 204.8 lb

## 2023-09-01 DIAGNOSIS — J849 Interstitial pulmonary disease, unspecified: Secondary | ICD-10-CM

## 2023-09-01 LAB — PULMONARY FUNCTION TEST
DL/VA % pred: 106 %
DL/VA: 4.37 ml/min/mmHg/L
DLCO cor % pred: 82 %
DLCO cor: 14.57 ml/min/mmHg
DLCO unc % pred: 76 %
DLCO unc: 13.48 ml/min/mmHg
FEF 25-75 Pre: 1.08 L/s
FEF2575-%Pred-Pre: 96 %
FEV1-%Pred-Pre: 82 %
FEV1-Pre: 1.36 L
FEV1FVC-%Pred-Pre: 104 %
FEV6-%Pred-Pre: 84 %
FEV6-Pre: 1.79 L
FEV6FVC-%Pred-Pre: 106 %
FVC-%Pred-Pre: 79 %
FVC-Pre: 1.79 L
Pre FEV1/FVC ratio: 76 %
Pre FEV6/FVC Ratio: 100 %

## 2023-09-01 NOTE — Progress Notes (Signed)
Spirometry and DLCO Performed Today.  

## 2023-09-01 NOTE — Patient Instructions (Signed)
Spirometry and DLCO Performed Today.

## 2023-09-01 NOTE — Progress Notes (Signed)
@Patient  ID: [name], female    DOB: 1941-06-10, 82 y.o.   MRN: [id number]  Chief Complaint  Patient presents with   Follow-up    Referring provider: Eden Emms, NP  HPI: 82 year old female, former smoker.  Past medical history significant for, pulmonary nodule, GERD, chronic diarrhea, rheumatoid arthritis.   09/01/2023 Discussed the use of AI scribe software for clinical note transcription with the patient, who gave verbal consent to proceed.  History of Present Illness  Patient presents today for a 3 to 71-month follow-up with PFTs.  Patient follows with Dr. Isaiah Serge for history of interstitial lung disease.   The patient, under observation for interstitial lung disease believed to be secondary to rheumatoid arthritis, last had a CT scan in February 2022. The scan showed a spectrum of fibrosis findings without progression since 2019. The patient's lung function, specifically the forced expiratory volume (FEV1), has remained stable. However, there has been a decrease in the diffusion capacity of the lungs, dropping from 93% to 82% predicted. Despite this, the patient reports no symptomatic worsening and feels her breathing has improved since starting an exercise routine.  The patient also reports a current flare-up of rheumatoid arthritis symptoms, causing general aches and reduced hand function. These flare-ups are managed with as-needed prednisone, which the patient has been advised to take for a few consecutive days during flare-ups for better symptom control. The patient's voice has been noted to be softer than usual, but no cough or other respiratory symptoms have been reported.     Pulmonary function testing 10/29/2022>> VC 1.93 (81%), FEV1 1.42 (81%), ratio 73, DLCO 16.86 (93%) Normal test  09/01/23>> FVC 1.79 (79%), FEV1 1.36 (82%), ratio 82%  Normal pulmonary function, restriction possible  Diffusion capacity is normal     Allergies  Allergen Reactions    Codeine Nausea And Vomiting   Hydrocodone Bit-Homatrop Mbr Other (See Comments)    Hallucinations     Immunization History  Administered Date(s) Administered   Fluad Quad(high Dose 65+) 06/27/2019, 08/13/2020, 07/17/2021, 09/04/2022   Fluad Trivalent(High Dose 65+) 08/02/2023   Influenza,inj,Quad PF,6+ Mos 08/05/2017, 08/04/2018   PFIZER(Purple Top)SARS-COV-2 Vaccination 10/20/2019, 11/10/2019, 05/28/2020, 01/15/2021   PNEUMOCOCCAL CONJUGATE-20 05/19/2022   Pfizer Covid-19 Vaccine Bivalent Booster 49yrs & up 08/19/2021   Pneumococcal Conjugate-13 07/10/2014   Pneumococcal Polysaccharide-23 01/04/2008   Td 10/05/1996, 01/04/2008   Zoster Recombinant(Shingrix) 05/19/2022   Zoster, Live 02/01/2008    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   Personal history of radiation therapy 2020   right breast   Phlebitis of leg, right, superficial 1986/1989   x 2   RA (rheumatoid arthritis) (HCC)    Urinary incontinence     Tobacco History: Social  History   Tobacco Use  Smoking Status Former   Current packs/day: 0.00   Average packs/day: 0.5 packs/day for 11.0 years (5.5 ttl pk-yrs)   Types: Cigarettes   Start date: 87   Quit date: 10/06/1963   Years since quitting: 59.9  Smokeless Tobacco Never   Counseling given: Not Answered   Outpatient Medications Prior to Visit  Medication Sig Dispense  Refill   acetaminophen (TYLENOL) 500 MG tablet Take 1,000 mg by mouth in the morning and at bedtime.     folic acid (FOLVITE) 1 MG tablet Take 2 mg by mouth daily.     hydroxychloroquine (PLAQUENIL) 200 MG tablet Take 200 mg by mouth 2 (two) times daily.     losartan (COZAAR) 25 MG tablet Take 1 tablet by mouth once daily 90 tablet 0   Vitamin D, Cholecalciferol, 25 MCG (1000 UT) TABS Take 1 tablet by mouth 2 (two) times daily.     XARELTO 20 MG TABS tablet TAKE 1 TABLET BY MOUTH  DAILY WITH SUPPER 90 tablet 3   estradiol (ESTRACE VAGINAL) 0.1 MG/GM vaginal cream Use vaginal estrogen cream nightly at bedtime for 2 weeks then three times a week after to assist with healing of the vulva. You will need to use a fingertip size amount of cream on your finger and place on the vulva, around the catheter insertion site. Do not use the applicator if this comes with the cream. (Patient not taking: Reported on 09/01/2023) 42.5 g 2   sertraline (ZOLOFT) 50 MG tablet Take 1 tablet (50 mg total) by mouth daily. (Patient not taking: Reported on 09/01/2023) 30 tablet 1   traMADol (ULTRAM) 50 MG tablet Take 1 tablet (50 mg total) by mouth every 12 (twelve) hours as needed for up to 4 doses. (Patient not taking: Reported on 09/01/2023) 4 tablet 0   Facility-Administered Medications Prior to Visit  Medication Dose Route Frequency Provider Last Rate Last Admin   regadenoson (LEXISCAN) injection SOLN 0.4 mg  0.4 mg Intravenous Once Hilty, Lisette Abu, MD       technetium tetrofosmin (TC-MYOVIEW) injection 32.7 millicurie  32.7 millicurie Intravenous Once PRN Hilty, Lisette Abu, MD          Review of Systems  Review of Systems  Constitutional: Negative.  Negative for fatigue.  HENT: Negative.    Respiratory:  Negative for cough, shortness of breath and wheezing.   Cardiovascular:  Positive for leg swelling.  Musculoskeletal:  Positive for arthralgias.     Physical Exam  BP 124/72 (BP Location: Left Arm,  Patient Position: Sitting, Cuff Size: Normal)   Pulse 75   Temp 98 F (36.7 C) (Oral)   Ht 5\' 2"  (1.575 m)   Wt 204 lb 12.8 oz (92.9 kg)   SpO2 95%   BMI 37.46 kg/m  Physical Exam Constitutional:      General: She is not in acute distress.    Appearance: Normal appearance. She is not ill-appearing.     Comments: Appears well   Cardiovascular:     Rate and Rhythm: Normal rate and regular rhythm.  Pulmonary:     Breath sounds: No wheezing or rales.  Musculoskeletal:        General: Normal range of motion.     Right lower leg: Edema present.     Left lower leg: Edema present.     Comments: Amb with cane  Skin:    General: Skin is warm and dry.  Neurological:     General:  No focal deficit present.     Mental Status: She is alert and oriented to person, place, and time. Mental status is at baseline.  Psychiatric:        Mood and Affect: Mood normal.        Behavior: Behavior normal.        Thought Content: Thought content normal.        Judgment: Judgment normal.      Lab Results:  CBC    Component Value Date/Time   WBC 6.9 08/09/2023 0822   RBC 3.97 08/09/2023 0822   HGB 11.2 (L) 08/09/2023 0822   HCT 35.7 (L) 08/09/2023 0822   PLT 319 08/09/2023 0822   MCV 89.9 08/09/2023 0822   MCH 28.2 08/09/2023 0822   MCHC 31.4 08/09/2023 0822   RDW 13.2 08/09/2023 0822   LYMPHSABS 1.5 06/19/2023 1324   MONOABS 0.6 06/19/2023 1324   EOSABS 0.2 06/19/2023 1324   BASOSABS 0.0 06/19/2023 1324    BMET    Component Value Date/Time   NA 134 (L) 07/14/2023 2107   NA 142 04/16/2023 0825   K 4.4 07/14/2023 2107   CL 101 07/14/2023 2107   CO2 25 07/14/2023 2107   GLUCOSE 101 (H) 07/14/2023 2107   BUN 11 07/14/2023 2107   BUN 17 04/16/2023 0825   CREATININE 0.64 07/14/2023 2107   CALCIUM 8.7 (L) 07/14/2023 2107   GFRNONAA >60 07/14/2023 2107   GFRAA >60 04/02/2020 0418    BNP    Component Value Date/Time   BNP 35.0 04/26/2022 1218    ProBNP    Component Value  Date/Time   PROBNP 44 04/16/2023 0825   PROBNP 25.0 04/10/2022 1303    Imaging: MM 3D DIAGNOSTIC MAMMOGRAM UNILATERAL LEFT BREAST  Result Date: 08/26/2023 CLINICAL DATA:  LEFT asymmetry callback. History of RIGHT breast DCIS and extramammary Paget's. LEFT excisional biopsy EXAM: DIGITAL DIAGNOSTIC UNILATERAL LEFT MAMMOGRAM WITH TOMOSYNTHESIS AND CAD TECHNIQUE: Left digital diagnostic mammography and breast tomosynthesis was performed. The images were evaluated with computer-aided detection. COMPARISON:  Previous exam(s). ACR Breast Density Category b: There are scattered areas of fibroglandular density. FINDINGS: The previously described finding does not persist with additional views, consistent with superimposed fibroglandular tissue. No suspicious mass, microcalcification, or other finding is identified. Stable postsurgical changes are noted. IMPRESSION: No mammographic evidence of malignancy. RECOMMENDATION: Screening mammogram in one year.(Code:SM-B-01Y) I have discussed the findings and recommendations with the patient. If applicable, a reminder letter will be sent to the patient regarding the next appointment. BI-RADS CATEGORY  2: Benign. Electronically Signed   By: Meda Klinefelter M.D.   On: 08/26/2023 12:02   MM 3D SCREENING MAMMOGRAM BILATERAL BREAST  Result Date: 08/12/2023 CLINICAL DATA:  Screening. EXAM: DIGITAL SCREENING BILATERAL MAMMOGRAM WITH TOMOSYNTHESIS AND CAD TECHNIQUE: Bilateral screening digital craniocaudal and mediolateral oblique mammograms were obtained. Bilateral screening digital breast tomosynthesis was performed. The images were evaluated with computer-aided detection. COMPARISON:  Previous exam(s). ACR Breast Density Category b: There are scattered areas of fibroglandular density. FINDINGS: In the left breast, a possible asymmetry warrants further evaluation. In the right breast, no findings suspicious for malignancy. IMPRESSION: Further evaluation is suggested for  possible asymmetry in the left breast. RECOMMENDATION: Diagnostic mammogram and possibly ultrasound of the left breast. (Code:[id number]-51M) The patient will be contacted regarding the findings, and additional imaging will be scheduled. BI-RADS CATEGORY  0: Incomplete: Need additional imaging evaluation. Electronically Signed   By: Jacob Moores M.D.   On: 08/12/2023 16:25  CT GUIDED SUPERPUBIC CATHETER PLMT  Result Date: 08/09/2023 CLINICAL DATA:  Long-term Foley catheter post total vulvectomy with agglutination EXAM: CT GUIDED SUPRAPUBIC CATHETER PLACEMENT COMPARISON:  CT 10/31/2020 ANESTHESIA/SEDATION: Intravenous Fentanyl and Versed 2mg  were administered as conscious sedation during continuous monitoring of the patient's level of consciousness and physiological / cardiorespiratory status by the radiology RN, with a total moderate sedation time of 17 minutes. PROCEDURE: Informed written consent was obtained from the patient after a thorough discussion of the procedural risks, benefits and alternatives. All questions were addressed. Maximal Sterile Barrier Technique was utilized including caps, mask, sterile gowns, sterile gloves, sterile drape, hand hygiene and skin antiseptic. A timeout was performed prior to the initiation of the procedure. Select axial CT scans were obtained through the urinary bladder. An appropriate skin entry site was determined and marked. Skin site was prepped with chlorhexidine, draped in usual sterile fashion, infiltrated locally with 1% lidocaine. Under intermittent CT fluoroscopic guidance, an 18 gauge trocar needle was advanced into the urinary bladder. Urine returned through the needle hub. Amplatz guidewire advanced easily, position confirmed on CT. Tract dilated to facilitate placement of a 14 French pigtail catheter, formed within the lumen of the urinary bladder. Position confirmed on CT. Catheter secured externally with 0 Prolene suture and StatLock and placed to  gravity drain bag. The patient tolerated the procedure well. COMPLICATIONS: None immediate. IMPRESSION: 1. Technically successful CT-guided suprapubic catheter placement. Electronically Signed   By: Corlis Leak M.D.   On: 08/09/2023 12:42     Assessment & Plan:   1. 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 Noted a decrease in diffusion capacity (DLCO) from 93% to 82% predicted (still within normal range), which may indicate progression of fibrosis, but could also be due to variation in hemoglobin levels. No significant change in FEV1. Patient reports improved breathing with increased mobility and exercise. -Order chest X-ray today to assess for any progression of ILD. -Continue annual monitoring of lung function with pulmonary function testing  -Consider repeat CT scan if significant change in lung function or progression of symptoms -Plan follow-up with Dr. Marchelle Gearing in six months  Rheumatoid Arthritis Patient reports current flare-up with increased pain and decreased hand function. Currently on Plaquenil and prednisone as needed. -Advise patient to take prednisone 10mg  for five days during flare-ups for more effective symptom control.  General Health Maintenance -Encourage continued walking and exercise regimen for overall health and lung function improvement.      Glenford Bayley, NP 09/01/2023

## 2023-09-01 NOTE — Patient Instructions (Signed)
-  INTERSTITIAL LUNG DISEASE (ILD) SECONDARY TO RHEUMATOID ARTHRITIS: Interstitial lung disease is a group of lung disorders that cause scarring of lung tissues, often related to rheumatoid arthritis. Your lung function remains stable, but there has been a slight decrease in your lung's ability to transfer oxygen. We will do a chest X-ray today to check for any changes and continue to monitor your lung function annually. If there are significant changes, we may consider another CT scan. Please follow up with Dr. Marchelle Gearing in six months.  -RHEUMATOID ARTHRITIS: Rheumatoid arthritis is an autoimmune disorder that causes joint inflammation and pain. You are experiencing a flare-up with increased pain and reduced hand function. We recommend taking prednisone 10mg  for five days and then tapering the dose over the next five days during flare-ups for better symptom control.  -GENERAL HEALTH MAINTENANCE: Continue your walking and exercise routine to help improve your overall health and lung function.  INSTRUCTIONS: Please get a chest X-ray today. Follow up with Dr. Marchelle Gearing in six months for your interstitial lung disease. Continue taking prednisone as advised during rheumatoid arthritis flare-ups.  ORDERS: CXR today  Follow-up 6 months with Dr. Marchelle Gearing

## 2023-09-07 ENCOUNTER — Other Ambulatory Visit: Payer: Self-pay | Admitting: Nurse Practitioner

## 2023-09-13 ENCOUNTER — Encounter: Payer: Self-pay | Admitting: Hematology and Oncology

## 2023-09-14 ENCOUNTER — Telehealth: Payer: Self-pay | Admitting: *Deleted

## 2023-09-14 ENCOUNTER — Encounter: Payer: Self-pay | Admitting: Psychiatry

## 2023-09-14 ENCOUNTER — Inpatient Hospital Stay: Payer: Medicare Other | Attending: Psychiatry | Admitting: Hematology and Oncology

## 2023-09-14 VITALS — BP 157/68 | HR 81 | Temp 97.7°F | Resp 18 | Ht 62.0 in | Wt 201.1 lb

## 2023-09-14 DIAGNOSIS — Z79899 Other long term (current) drug therapy: Secondary | ICD-10-CM | POA: Diagnosis not present

## 2023-09-14 DIAGNOSIS — M069 Rheumatoid arthritis, unspecified: Secondary | ICD-10-CM | POA: Diagnosis not present

## 2023-09-14 DIAGNOSIS — Z7901 Long term (current) use of anticoagulants: Secondary | ICD-10-CM | POA: Insufficient documentation

## 2023-09-14 DIAGNOSIS — Z86711 Personal history of pulmonary embolism: Secondary | ICD-10-CM | POA: Insufficient documentation

## 2023-09-14 DIAGNOSIS — D0511 Intraductal carcinoma in situ of right breast: Secondary | ICD-10-CM | POA: Insufficient documentation

## 2023-09-14 NOTE — Progress Notes (Signed)
Patient Care Team: Eden Emms, NP as PCP - General (Nurse Practitioner) Wendall Stade, MD as PCP - Cardiology (Cardiology) Windy Carina, PA-C (Inactive) as Physician Assistant (Rheumatology) Tobias Alexander, OD as Referring Physician (Optometry) Arminda Resides, MD as Consulting Physician (Dermatology) Pershing Proud, RN as Oncology Nurse Navigator Donnelly Angelica, RN as Oncology Nurse Navigator  DIAGNOSIS:  Encounter Diagnosis  Name Primary?   Ductal carcinoma in situ (DCIS) of right breast Yes    SUMMARY OF ONCOLOGIC HISTORY: Oncology History  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/27/2019 Initial Diagnosis   Patient reported two weeks of spontaneous right nipple discharge. 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 Biopsy on 07/05/19 showed fibrocystic changes in the left breast, no evidence of malignancy. Breast MRI on 08/11/19 showed a 1.7cm right nipple base mass, a 0.9cm superior central right breast mass, and indeterminate non-mass enhancement in the central left breast. Biopsy on 08/24/19 showed no evidence of malignancy in the left breast, and in the right breast, DCIS with necrosis, high grade, ER+ 100%, PR+ 70%.    09/20/2019 Surgery   Bilateral lumpectomies ([facility]): Left breast: complex sclerosing lesion with no evidence of malignancy Right breast: nipple adenoma with intraductal papilloma and DCIS, intermediate grade, 0.4cm, clear margins.   11/28/2019 -  Radiation Therapy   Adjuvant radiation     CHIEF COMPLIANT: Follow-up of DCIS and history of pulmonary embolism  HISTORY OF PRESENT ILLNESS:  History of Present Illness   The patient, with a history of DCIS diagnosed approximately four years ago, has been maintaining regular surveillance and checks. The patient recently had a mammogram in March and is scheduled to see  her surgeon at the end of January. The patient also has a history of a pulmonary embolism and was placed on Xarelto due to a concern for a blood clotting disorder. However, recent testing has shown negative results for the presence of the antibody that predisposes to frequent blood clots. The patient has lost weight recently, reportedly around 25 pounds, due to hospitalization. The patient is also trying to increase physical activity.         ALLERGIES:  is allergic to codeine and hydrocodone bit-homatrop mbr.  MEDICATIONS:  Current Outpatient Medications  Medication Sig Dispense Refill   acetaminophen (TYLENOL) 500 MG tablet Take 1,000 mg by mouth in the morning and at bedtime.     folic acid (FOLVITE) 1 MG tablet Take 2 mg by mouth daily.     hydroxychloroquine (PLAQUENIL) 200 MG tablet Take 200 mg by mouth 2 (two) times daily.     losartan (COZAAR) 25 MG tablet Take 1 tablet by mouth once daily 90 tablet 0   Vitamin D, Cholecalciferol, 25 MCG (1000 UT) TABS Take 1 tablet by mouth 2 (two) times daily.     No current facility-administered medications for this visit.   Facility-Administered Medications Ordered in Other Visits  Medication Dose Route Frequency Provider Last Rate Last Admin   regadenoson (LEXISCAN) injection SOLN 0.4 mg  0.4 mg Intravenous Once Hilty, Lisette Abu, MD       technetium tetrofosmin (TC-MYOVIEW) injection 32.7 millicurie  32.7 millicurie Intravenous Once PRN Hilty, Lisette Abu, MD        PHYSICAL EXAMINATION: ECOG PERFORMANCE STATUS: 1 - Symptomatic but completely ambulatory  Vitals:   09/14/23 0818  BP: (!) 157/68  Pulse: 81  Resp: 18  Temp: 97.7 F (36.5 C)  SpO2: 97%   Filed Weights   09/14/23 0818  Weight: 201 lb 1.6 oz (91.2 kg)    Physical Exam   CHEST: Examined and no abnormalities noted. BREAST: No issues or concerns noted during the examination.      (exam performed in the presence of a chaperone)  LABORATORY DATA:  I have reviewed the  data as listed    Latest Ref Rng & Units 07/14/2023    9:07 PM 06/19/2023    1:24 PM 04/20/2023   11:01 AM  CMP  Glucose 70 - 99 mg/dL 811  914  95   BUN 8 - 23 mg/dL 11  9  14    Creatinine 0.44 - 1.00 mg/dL 7.82  9.56  2.13   Sodium 135 - 145 mmol/L 134  133  141   Potassium 3.5 - 5.1 mmol/L 4.4  4.0  3.9   Chloride 98 - 111 mmol/L 101  100  104   CO2 22 - 32 mmol/L 25  25    Calcium 8.9 - 10.3 mg/dL 8.7  8.2      Lab Results  Component Value Date   WBC 6.9 08/09/2023   HGB 11.2 (L) 08/09/2023   HCT 35.7 (L) 08/09/2023   MCV 89.9 08/09/2023   PLT 319 08/09/2023   NEUTROABS 4.5 06/19/2023    ASSESSMENT & PLAN:  Ductal carcinoma in situ (DCIS) of right breast 09/10/2019:Bilateral lumpectomies ([facility]): Left breast: complex sclerosing lesion with no evidence of malignancy Right breast: nipple adenoma with intraductal papilloma and DCIS, intermediate grade, 0.4cm, clear margins.  ER 100%, PR 70% Tis NX stage 0   Treatment plan: Adjuvant radiation 11/28/2019-12/19/2019 Patient decided not to take antiestrogen therapy because of her history of superficial venous thrombosis. Hospitalization 04/01/2020 04/02/2020: Bilateral PEs-(no clear-cut precipitating causes) Current treatment: Xarelto (discontinued 09/14/2023) Takes Humira for rheumatoid arthritis   Retesting for Lupus anti-coagulant was negative and therefore I recommended discontinuation of anticoagulation therapy at this time.    Severe constipation: Continue with MiraLAX Weight gain: She lost 25 pounds since she was in the hospital  Breast cancer surveillance: Breast exam 09/14/2023: Benign Mammogram 08/12/2023: Left breast asymmetry, diagnostic mammogram 08/26/2023: Benign, breast density category B   Return to clinic in 1 year for follow-up      No orders of the defined types were placed in this encounter.  The patient has a good understanding of the overall plan. she agrees with it. she will call with any problems  that may develop before the next visit here. Total time spent: 30 mins including face to face time and time spent for planning, charting and co-ordination of care   Tamsen Meek, MD 09/14/23

## 2023-09-14 NOTE — Assessment & Plan Note (Addendum)
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  ER 100%, PR 70% Tis NX stage 0   Treatment plan: Adjuvant radiation 11/28/2019-12/19/2019 Patient decided not to take antiestrogen therapy because of her history of superficial venous thrombosis. Hospitalization 04/01/2020 04/02/2020: Bilateral PEs-(no clear-cut precipitating causes) Current treatment: Xarelto (discontinued 09/14/2023) Takes Humira for rheumatoid arthritis   Retesting for Lupus anti-coagulant was negative and therefore I recommended discontinuation of anticoagulation therapy at this time.    Severe constipation: Continue with MiraLAX Weight gain: She lost 25 pounds since she was in the hospital  Breast cancer surveillance: Breast exam 09/14/2023: Benign Mammogram 08/12/2023: Left breast asymmetry, diagnostic mammogram 08/26/2023: Benign, breast density category B   Return to clinic in 1 year for follow-up

## 2023-09-14 NOTE — Telephone Encounter (Signed)
Per Dr Alvester Morin patient scheduled for a follow up the end of January. Patient given the date/time

## 2023-09-20 NOTE — Progress Notes (Signed)
  Patient is asymptomatic. PFTs remain normal. CXR showed subtle changes to costophrenic angle likely corresponding to know fibrosis, no other findings of ILD. Continue to monitor clinically, if symptoms worsen notify office. We will get repeat PFTs in 6-12 months as previous recommended

## 2023-10-23 ENCOUNTER — Other Ambulatory Visit: Payer: Self-pay | Admitting: Nurse Practitioner

## 2023-10-25 ENCOUNTER — Encounter: Payer: Self-pay | Admitting: Psychiatry

## 2023-10-25 ENCOUNTER — Other Ambulatory Visit: Payer: Self-pay

## 2023-10-25 ENCOUNTER — Inpatient Hospital Stay (HOSPITAL_BASED_OUTPATIENT_CLINIC_OR_DEPARTMENT_OTHER): Payer: Medicare Other | Admitting: Psychiatry

## 2023-10-25 ENCOUNTER — Inpatient Hospital Stay: Payer: Medicare Other | Attending: Psychiatry

## 2023-10-25 VITALS — BP 135/65 | HR 75 | Temp 97.9°F | Resp 17 | Ht 62.0 in | Wt 207.4 lb

## 2023-10-25 DIAGNOSIS — C519 Malignant neoplasm of vulva, unspecified: Secondary | ICD-10-CM

## 2023-10-25 DIAGNOSIS — Z978 Presence of other specified devices: Secondary | ICD-10-CM

## 2023-10-25 DIAGNOSIS — Z923 Personal history of irradiation: Secondary | ICD-10-CM | POA: Diagnosis not present

## 2023-10-25 DIAGNOSIS — Z8544 Personal history of malignant neoplasm of other female genital organs: Secondary | ICD-10-CM

## 2023-10-25 DIAGNOSIS — R339 Retention of urine, unspecified: Secondary | ICD-10-CM | POA: Diagnosis not present

## 2023-10-25 DIAGNOSIS — Z9079 Acquired absence of other genital organ(s): Secondary | ICD-10-CM | POA: Diagnosis not present

## 2023-10-25 DIAGNOSIS — R8290 Unspecified abnormal findings in urine: Secondary | ICD-10-CM

## 2023-10-25 NOTE — Progress Notes (Signed)
Gynecologic Oncology Return Clinic Visit  Date of Service: 10/25/2023 Referring Provider: Mitchel Honour, DO  Plastic Surgeon: Dr. Almedia Balls Urology: Kasandra Knudsen, MD; Myer Haff, MD  Assessment & Plan: [name] is a 83 y.o. 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 Almedia Balls, postop course complicated by urinary retention. She presents today for follow-up.    Urinary retention: -S/p consult with Dr. Guy Sandifer at [facility] -plan for suprapubic catheter placement 2/5. -Dirty UA from visit with Dr. Guy Sandifer. Pt concerned with odorous urine and some bladder irritation. Will send for culture today.   Vulvar Paget's disease: -Pathology without any evidence of invasive carcinoma. - Positive margins as was expected. - Reviewed that if patient becomes symptomatic again in the future could consider topical treatment with imiquimod. Reviewed surgical excision is an option but would weigh pros/cons of additional surgery with patient. - Two areas on exam today with possible paget's. Biopsy deferred today given asymptomatic but if persistent at next visit we will biopsy. - Continue q 3 month surveillance.  RTC 47mo  Clide Cliff, MD Gynecologic Oncology  Medical Decision Making I personally spent  TOTAL 35 minutes face-to-face and non-face-to-face in the care of this patient, which includes all pre, intra, and post visit time on the date of service.   ----------------------- Reason for Visit: Follow-up  Treatment History: Oncology History  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/27/2019 Initial Diagnosis   Patient reported two weeks of spontaneous right nipple discharge. Mammogram on 07/04/19 showed no evidence of right breast malignancy with an ulcerated right nipple lesion suspicious for Paget's disease and a 0.8cm  mass in the left breast. Biopsy on 07/05/19 showed fibrocystic changes in the left breast, no evidence of malignancy. Breast MRI on 08/11/19 showed a 1.7cm right nipple base mass, a 0.9cm superior central right breast mass, and indeterminate non-mass enhancement in the central left breast. Biopsy on 08/24/19 showed no evidence of malignancy in the left breast, and in the right breast, DCIS with necrosis, high grade, ER+ 100%, PR+ 70%.    09/20/2019 Surgery   Bilateral lumpectomies ([facility]): Left breast: complex sclerosing lesion with no evidence of malignancy Right breast: nipple adenoma with intraductal papilloma and DCIS, intermediate grade, 0.4cm, clear margins.   11/28/2019 -  Radiation Therapy   Adjuvant radiation     Interval History: Was seen by Dr. Guy Sandifer at [facility] on 10/18/2023.  He recommended suprapubic catheter with IR and removal of urethral catheter with plan for cystoscopy in 6 to 8 weeks.  Today, patient presents with her daughter.  They report she has been doing overall better since Christmas.  In general has been doing better with the urethral catheter but a little bit more bothersome in the past month.  Suprapubic catheter insertion is scheduled for 11/10/2023.  Reports odor that she thinks is coming from the vaginal area but is also associated with when she empties her urine.  Did have a urinalysis performed at Dr. Ericka Pontiff office but reports that they did not hear of the results.  Feels some bladder irritations at time.  Also has some irritation around where the catheter comes out.  Could not use dilator and placed estrogen herself, so not really doing this anymore.  Otherwise denies any bleeding, itching, vulvar lesions, or vulvar concerns.    Past Medical/Surgical History: Past Medical History:  Diagnosis Date   Ambulates with cane    Antiphospholipid antibody syndrome (HCC)    Breast cancer    right   Chest discomfort on left    when lies down, she has to sit up   chronic  sob on occasion    Coronary artery calcification seen on CT scan 04/15/2023   Dysrhythmia    PSVT/ palpitations-  controlled with prn Inderal   History of pulmonary embolism    2020 or 2021   Hyperlipidemia    statin intolerant   Hypertension    Interstitial lung disease (HCC)    Left arm numbness    at rest and with activity   Migraines    Obesity    Osteoarthritis    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   Personal history of radiation therapy 2020   right breast   Phlebitis of leg, right, superficial 1986/1989   x 2   RA (rheumatoid arthritis) (HCC)    Urinary incontinence     Past Surgical History:  Procedure Laterality Date   BOTOX INJECTION N/A 04/20/2023   Procedure: BOTOX INJECTION 100 UNITS;  Surgeon: Noel Christmas, MD;  Location: [facility];  Service: Urology;  Laterality: N/A;   BREAST BIOPSY Left 07/05/2019   BREAST BIOPSY Bilateral 08/24/2019   BREAST EXCISIONAL BIOPSY Left 09/20/2019   BREAST LUMPECTOMY Right 09/20/2019   BREAST LUMPECTOMY WITH RADIOACTIVE SEED LOCALIZATION Bilateral 09/20/2019   Procedure: BILATERAL BREAST LUMPECTOMIES WITH BILATERAL RADIOACTIVE SEED LOCALIZATION AND RIGHT NIPPLE BIOPSY;  Surgeon: Harriette Bouillon, MD;  Location: [facility];  Service: General;  Laterality: Bilateral;   CARDIAC CATHETERIZATION  1990s   negative   CARDIOVASCULAR STRESS TEST  2008   NML   CARDIOVASCULAR STRESS TEST  02/02/2007   EF 70%, NO ISCHEMIA   CARPAL TUNNEL RELEASE Bilateral    CARPAL TUNNEL RELEASE Bilateral    CYSTOSCOPY WITH INJECTION N/A 04/20/2023   Procedure: CYSTOSCOPY WITH INJECTION OF Mariam Dollar;  Surgeon: Noel Christmas, MD;  Location: [facility];  Service: Urology;  Laterality: N/A;  45 MINS   DILATION AND CURETTAGE, DIAGNOSTIC / THERAPEUTIC  2008   LESION DESTRUCTION  10/2017   Face; Dr. Yetta Barre   ROTATOR CUFF REPAIR  1999   Right, left in 2002   TOTAL KNEE ARTHROPLASTY  2008   Left    TOTAL KNEE ARTHROPLASTY  10/11/2012   Procedure: TOTAL KNEE ARTHROPLASTY;  Surgeon: Shelda Pal, MD;  Location: [facility];  Service: Orthopedics;  Laterality: Right;   US ECHOCARDIOGRAPHY  02/07/2007   EF 55-60%    Family History  Problem Relation Age of Onset   Hypertension Mother    Heart attack Father 47   Hypertension Father    Peptic Ulcer Disease Father    Heart defect Sister    Lung cancer Brother    Coronary artery disease Other        Female 1st degree relative <50   Colon cancer Neg Hx    Prostate cancer Neg Hx    Ovarian cancer Neg Hx    Pancreatic cancer Neg Hx    Endometrial cancer Neg Hx    Breast cancer Neg Hx    BRCA 1/2 Neg Hx     Social History   Socioeconomic History   Marital status: Married    Spouse name: Not on file   Number of children: Not on file   Years of education: Not on file   Highest education  level: Not on file  Occupational History   Occupation: Retired  Tobacco Use   Smoking status: Former    Current packs/day: 0.00    Average packs/day: 0.5 packs/day for 11.0 years (5.5 ttl pk-yrs)    Types: Cigarettes    Start date: 2    Quit date: 10/06/1963    Years since quitting: 60.0   Smokeless tobacco: Never  Vaping Use   Vaping status: Never Used  Substance and Sexual Activity   Alcohol use: No    Comment: stopped 5 years ago   Drug use: No   Sexual activity: Yes  Other Topics Concern   Not on file  Social History Narrative   Regular exercise: yes walks 1 mile a day   Diet: loves butter, fruit and veggies   Social Drivers of Health   Financial Resource Strain: Low Risk  (05/14/2023)   Received from [facility]   Overall Financial Resource Strain (CARDIA)    Difficulty of Paying Living Expenses: Not hard at all  Food Insecurity: No Food Insecurity (05/14/2023)   Received from [facility]   Hunger Vital Sign    Worried About Running Out of Food in the Last Year: Never true    Ran Out of Food in the Last Year: Never true   Transportation Needs: No Transportation Needs (06/11/2023)   Received from [facility]   PRAPARE - Transportation    Lack of Transportation (Medical): No    Lack of Transportation (Non-Medical): No  Physical Activity: Sufficiently Active (02/25/2022)   Exercise Vital Sign    Days of Exercise per Week: 7 days    Minutes of Exercise per Session: 60 min  Stress: No Stress Concern Present (02/25/2022)   [employer] of Occupational Health - Occupational Stress Questionnaire    Feeling of Stress : Not at all  Social Connections: Not on file    Current Medications:  Current Outpatient Medications:    acetaminophen (TYLENOL) 500 MG tablet, Take 1,000 mg by mouth in the morning and at bedtime., Disp: , Rfl:    folic acid (FOLVITE) 1 MG tablet, Take 2 mg by mouth daily., Disp: , Rfl:    hydroxychloroquine (PLAQUENIL) 200 MG tablet, Take 200 mg by mouth 2 (two) times daily., Disp: , Rfl:    losartan (COZAAR) 25 MG tablet, Take 1 tablet by mouth once daily, Disp: 90 tablet, Rfl: 0   Vibegron (GEMTESA) 75 MG TABS, Take by mouth., Disp: , Rfl:    Vitamin D, Cholecalciferol, 25 MCG (1000 UT) TABS, Take 1 tablet by mouth 2 (two) times daily., Disp: , Rfl:  No current facility-administered medications for this visit.  Facility-Administered Medications Ordered in Other Visits:    regadenoson (LEXISCAN) injection SOLN 0.4 mg, 0.4 mg, Intravenous, Once, Hilty, Lisette Abu, MD   technetium tetrofosmin (TC-MYOVIEW) injection 32.7 millicurie, 32.7 millicurie, Intravenous, Once PRN, Hilty, Lisette Abu, MD  Review of Symptoms: Complete 10-system review is positive for: none  Physical Exam: BP 135/65 (BP Location: Left Arm, Patient Position: Sitting)   Pulse 75   Temp 97.9 F (36.6 C) (Oral)   Resp 17   Ht 5\' 2"  (1.575 m)   Wt 207 lb 6.4 oz (94.1 kg)   SpO2 95%   BMI 37.93 kg/m  General: Alert, oriented, no acute distress. HEENT: Normocephalic, atraumatic. Neck symmetric without masses. Sclera  anicteric.  Chest: Normal work of breathing.  Abdomen: Soft, nontender.   Extremities: Grossly normal range of motion.  Warm, well  perfused.  Skin: No rashes or lesions noted. GU: External genitalia overall well-healed from prior vulvectomy. Erythema along scar anteriorly near mons, asymptomatic.  Small vaginal opening. Indwelling foley catheter in place. Unable to visualize the urethral opening. Able to pass fingertip through introitus to approximately the urethral opening. Erythema, inflamed at 2-4 o'clock near anal opening, asymptomatic.  Exam chaperoned by Warner Mccreedy, NP   Laboratory & Radiologic Studies: Urinalysis (at [facility]) (10/18/23): Color, UA Light Yellow  Clarity, UA Clear  Specific Gravity, UA 1.003 - 1.030 1.014  pH, UA 5.0 - 9.0 7  Leukocyte Esterase, UA Negative Large Abnormal   Nitrite, UA Negative Positive Abnormal   Protein, UA Negative Trace Abnormal   Glucose, UA Negative Negative  Ketones, UA Negative Negative  Urobilinogen, UA <2.0 mg/dL <2.9 mg/dL  Bilirubin, UA Negative Negative  Blood, UA Negative Negative  RBC, UA <=4 /HPF 3  WBC, UA 0 - 5 /HPF 36 High   Squam Epithel, UA 0 - 5 /HPF <1  Bacteria, UA None Seen /HPF Moderate Abnormal

## 2023-10-25 NOTE — Patient Instructions (Signed)
We will contact you with the results of the urine sample from today. We are sending this for culture which can take several days to return.  Plan on following up with [name] in three months or sooner if needed.

## 2023-10-26 ENCOUNTER — Telehealth: Payer: Self-pay | Admitting: *Deleted

## 2023-10-26 ENCOUNTER — Other Ambulatory Visit: Payer: Self-pay | Admitting: Gynecologic Oncology

## 2023-10-26 DIAGNOSIS — N390 Urinary tract infection, site not specified: Secondary | ICD-10-CM

## 2023-10-26 MED ORDER — AMPICILLIN 500 MG PO CAPS: 500.0000 mg | ORAL_CAPSULE | Freq: Four times a day (QID) | ORAL | 0 refills | Status: DC

## 2023-10-26 NOTE — Telephone Encounter (Signed)
Spoke with patient who called the office inquiring about Rx that was sent in to her pharmacy. Pt states the pharmacy won't have it in stock until tomorrow after 12 noon. Advised patient that it's ok and that the office would call her tomorrow to check in especially if the urine culture results are complete. Pt verbalized understanding. Pt also states she is going to have her son help her change out her leg bag to a larger urine bag that she can hang below bladder while sleeping. Advised patient that would be better to have that larger bag to gravity below level of bladder at night.

## 2023-10-26 NOTE — Progress Notes (Signed)
See note. Plan for ampicillin based on prelim urine culture results per [name]. Will continue to follow sensitivities to see if abx change is necessary.

## 2023-10-26 NOTE — Telephone Encounter (Signed)
Spoke with patient's daughter Maryruth Hancock and relayed message from Warner Mccreedy, NP that looks like there is an infection in Ms. Marlett's urine. We are waiting on the sensitivities and want to go ahead and get treatment started with ampicillin. Rx has been to sent to pharmacy. We will reach back out once the urine culture is complete and make adjustments to antibiotic if needed. Maryruth Hancock verbalized understanding and thanked the office for calling.

## 2023-10-26 NOTE — Telephone Encounter (Signed)
-----   Message from Doylene Bode sent at 10/26/2023  2:32 PM EST ----- Please call the patient and let her know it looks like there is an infection in her urine. We are waiting on the sensitivities and want to go ahead and get treatment started with ampicillin. Has she ever had any issues with penicillins? We will send this in to her pharmacy. We will contact her with the sensitivities once those return and if the antibiotic needs to be adjusted. ----- Message ----- From: Clide Cliff, MD Sent: 10/26/2023   2:19 PM EST To: Doylene Bode, NP  Looks like based on prior bugs, she could get bactrim. She could also get ampicillin based on sensitivities from prior cultures with those bugs.  Sharyl Nimrod ----- Message ----- From: Doylene Bode, NP Sent: 10/26/2023   1:45 PM EST To: Clide Cliff, MD  Abx hx I can find: doxy for 10 days in Dec 2024, levaquin for 5 days in Sept 2024. Can send in bactrim or wait for susceptibilities. Culture in Sept showing resistance to macrobid ----- Message ----- From: Leory Plowman, Lab In [place] Sent: 10/26/2023  11:36 AM EST To: Doylene Bode, NP

## 2023-10-27 ENCOUNTER — Ambulatory Visit: Payer: Self-pay | Admitting: Nurse Practitioner

## 2023-10-27 ENCOUNTER — Other Ambulatory Visit: Payer: Self-pay | Admitting: Nurse Practitioner

## 2023-10-27 DIAGNOSIS — I10 Essential (primary) hypertension: Secondary | ICD-10-CM

## 2023-10-27 LAB — URINE CULTURE: Culture: >=100000 — AB

## 2023-10-27 MED ORDER — LOSARTAN POTASSIUM 50 MG PO TABS: 50.0000 mg | ORAL_TABLET | Freq: Every day | ORAL | 0 refills | Status: DC

## 2023-10-27 NOTE — Telephone Encounter (Signed)
Patient was called and message left to call back to clarify medication dosing for Losartan. Patient had placed a medication refill earlier today and stipulated that she has been taking 25mg  tablet twice a day. Order in the chart is for 25mg  once a day.  Patient to be placed back into callback folder.

## 2023-10-27 NOTE — Telephone Encounter (Signed)
Spoke with Ms. [name] and relayed message from Warner Mccreedy, NP that sensitivities are back and it looks like ampicillin should treat both bacteria in the urine causing infection. Continue with plan and pick up Rx when it's ready from pharmacy and call the office with any concerns. Pt verbalized understanding and thanked the office for calling.

## 2023-10-27 NOTE — Telephone Encounter (Signed)
I have sent in the 50mg  tablets. Patient needs an office visit with me for further refills. Make sure she is aware to do 1 tablet as I did increase the MG

## 2023-10-27 NOTE — Telephone Encounter (Signed)
The patient called back and confirmed that she has been taking two 25 mg losartan tablets daily since September or October due to 25 mg not being as effective.  She stated that Mordecai Maes, NP approved this dosage increase.  Her most recent prescription was denied as it is written for 1 tablet daily.  She is requesting a new prescription be sent. Reason for Disposition  [1] Caller has NON-URGENT medicine question about med that PCP prescribed AND [2] triager unable to answer question  Answer Assessment - Initial Assessment Questions 1. NAME of MEDICINE: "What medicine(s) are you calling about?"     losartan 2. QUESTION: "What is your question?" (e.g., double dose of medicine, side effect)     New order for losartan 50 mg daily; has been taking this way since September or October 3. PRESCRIBER: "Who prescribed the medicine?" Reason: if prescribed by specialist, call should be referred to that group.     Mordecai Maes, NP  Protocols used: Medication Question Call-A-AH

## 2023-10-27 NOTE — Addendum Note (Signed)
Addended by: Eden Emms on: 10/27/2023 04:30 PM   Modules accepted: Orders

## 2023-10-27 NOTE — Telephone Encounter (Signed)
Copied from CRM [id number]. Topic: Clinical - Medication Refill >> Oct 27, 2023  2:43 PM Almira Coaster wrote: Most Recent Primary Care Visit:  Provider: Eden Emms  Department: [facility]  Visit Type: OFFICE VISIT  Date: 11/09/2022  Medication: losartan (COZAAR) 25 MG tablet, Patient is taking two pills a day.   Has the patient contacted their pharmacy? Yes, refill request was sent to the office. (Agent: If no, request that the patient contact the pharmacy for the refill. If patient does not wish to contact the pharmacy document the reason why and proceed with request.) (Agent: If yes, when and what did the pharmacy advise?)  Is this the correct pharmacy for this prescription? Yes If no, delete pharmacy and type the correct one.  This is the patient's preferred pharmacy:  [facility] [address] [place], [place] - [address] [place] [address] Phone: [phone] Fax: [phone]    Has the prescription been filled recently? No  Is the patient out of the medication? No  Has the patient been seen for an appointment in the last year OR does the patient have an upcoming appointment? Yes, last appointment was Feb 5,2024.   Can we respond through MyChart? No, she prefers a phone call  Agent: Please be advised that Rx refills may take up to 3 business days. We ask that you follow-up with your pharmacy.

## 2023-10-27 NOTE — Telephone Encounter (Signed)
-----   Message from Doylene Bode sent at 10/27/2023  9:17 AM EST ----- The sensitivities are back and it looks like ampicillin should treat both bacteria in the urine causing infection. Continue with current plan ----- Message ----- From: Leory Plowman, Lab In [place] Sent: 10/26/2023  11:36 AM EST To: Doylene Bode, NP

## 2023-10-28 NOTE — Telephone Encounter (Signed)
Contacted pt to inform he of script sent in. Pt verbalized understanding and has no questions or concerns.  Attempted to schedule pt for OV. Pt states that she will call back to schedule appointment.

## 2023-11-08 ENCOUNTER — Ambulatory Visit: Payer: Medicare Other | Admitting: Nurse Practitioner

## 2023-11-08 VITALS — BP 102/78 | HR 75 | Temp 98.4°F | Ht 62.0 in | Wt 207.0 lb

## 2023-11-08 DIAGNOSIS — L600 Ingrowing nail: Secondary | ICD-10-CM | POA: Diagnosis not present

## 2023-11-08 NOTE — Progress Notes (Signed)
   Acute Office Visit  Subjective:     Patient ID: [name], female    DOB: Nov 22, 1940, 83 y.o.   MRN: [id number]  Chief Complaint  Patient presents with   Toe Pain    Pt complains of sore toe. States she believes its an ingrown toe on left foot. Pt states its been ongoing since August. Pain is inscreasing especially when walking.      Patient is in today for toe pain with a history of coronary artery calcification, HTN, PSVT, ILD, RA, breast cancer, urinary stricture, prediabetes  Left toe that has been huritng her since August that is intermittent. When she is walking it hurts,does not hurt when she is resting.  She denies any discharge fever or chills.  States that she has a rash for approx 1 week that does not itch. She has been using lotrmin. She has been getting relief with the otc treatment  Review of Systems  Constitutional:  Negative for chills and fever.  Respiratory:  Negative for shortness of breath.   Cardiovascular:  Negative for chest pain.  Musculoskeletal:  Positive for joint pain.  Neurological:  Negative for headaches.  Psychiatric/Behavioral:  Negative for hallucinations and suicidal ideas.         Objective:    BP 102/78   Pulse 75   Temp 98.4 F (36.9 C) (Oral)   Ht 5\' 2"  (1.575 m)   Wt 207 lb (93.9 kg)   SpO2 91%   BMI 37.86 kg/m  BP Readings from Last 3 Encounters:  11/08/23 102/78  10/25/23 135/65  09/14/23 (!) 157/68   Wt Readings from Last 3 Encounters:  11/08/23 207 lb (93.9 kg)  10/25/23 207 lb 6.4 oz (94.1 kg)  09/14/23 201 lb 1.6 oz (91.2 kg)   SpO2 Readings from Last 3 Encounters:  11/08/23 91%  10/25/23 95%  09/14/23 97%      Physical Exam Vitals and nursing note reviewed.  Constitutional:      Appearance: Normal appearance.  Cardiovascular:     Rate and Rhythm: Normal rate and regular rhythm.     Heart sounds: Normal heart sounds.  Pulmonary:     Effort: Pulmonary effort is normal.     Breath sounds:  Normal breath sounds.  Musculoskeletal:       Feet:  Neurological:     Mental Status: She is alert.     No results found for any visits on 11/08/23.      Assessment & Plan:   Problem List Items Addressed This Visit       Musculoskeletal and Integument   Ingrown toenail - Primary   No acute infections. Amb refer to podiatry. S/S reviewed when to reach out for abx. Can use epsom salt for releif and OTC analgesics as needed      Relevant Orders   Ambulatory referral to Podiatry    No orders of the defined types were placed in this encounter.   Return in about 3 months (around 02/05/2024) for CPE and Labs.  Audria Nine, NP

## 2023-11-08 NOTE — Patient Instructions (Signed)
Nice to see you today  I have referred you to the foot specialist Follow up with me in 3-4 months for your physical, sooner if you need me

## 2023-11-08 NOTE — Progress Notes (Signed)
   Acute Office Visit  Subjective:     Patient ID: MARITZA HOSTERMAN, female    DOB: Nov 22, 1940, 83 y.o.   MRN: [id number]  Chief Complaint  Patient presents with   Toe Pain    Pt complains of sore toe. States she believes its an ingrown toe on left foot. Pt states its been ongoing since August. Pain is inscreasing especially when walking.      Patient is in today for toe pain with a history of coronary artery calcification, HTN, PSVT, ILD, RA, breast cancer, urinary stricture, prediabetes  Left toe that has been huritng her since August that is intermittent. When she is walking it hurts,does not hurt when she is resting.  She denies any discharge fever or chills.  States that she has a rash for approx 1 week that does not itch. She has been using lotrmin. She has been getting relief with the otc treatment  Review of Systems  Constitutional:  Negative for chills and fever.  Respiratory:  Negative for shortness of breath.   Cardiovascular:  Negative for chest pain.  Musculoskeletal:  Positive for joint pain.  Neurological:  Negative for headaches.  Psychiatric/Behavioral:  Negative for hallucinations and suicidal ideas.         Objective:    BP 102/78   Pulse 75   Temp 98.4 F (36.9 C) (Oral)   Ht 5\' 2"  (1.575 m)   Wt 207 lb (93.9 kg)   SpO2 91%   BMI 37.86 kg/m  BP Readings from Last 3 Encounters:  11/08/23 102/78  10/25/23 135/65  09/14/23 (!) 157/68   Wt Readings from Last 3 Encounters:  11/08/23 207 lb (93.9 kg)  10/25/23 207 lb 6.4 oz (94.1 kg)  09/14/23 201 lb 1.6 oz (91.2 kg)   SpO2 Readings from Last 3 Encounters:  11/08/23 91%  10/25/23 95%  09/14/23 97%      Physical Exam Vitals and nursing note reviewed.  Constitutional:      Appearance: Normal appearance.  Cardiovascular:     Rate and Rhythm: Normal rate and regular rhythm.     Heart sounds: Normal heart sounds.  Pulmonary:     Effort: Pulmonary effort is normal.     Breath sounds:  Normal breath sounds.  Musculoskeletal:       Feet:  Neurological:     Mental Status: She is alert.     No results found for any visits on 11/08/23.      Assessment & Plan:   Problem List Items Addressed This Visit       Musculoskeletal and Integument   Ingrown toenail - Primary   No acute infections. Amb refer to podiatry. S/S reviewed when to reach out for abx. Can use epsom salt for releif and OTC analgesics as needed      Relevant Orders   Ambulatory referral to Podiatry    No orders of the defined types were placed in this encounter.   Return in about 3 months (around 02/05/2024) for CPE and Labs.  Audria Nine, NP

## 2023-11-08 NOTE — Assessment & Plan Note (Signed)
No acute infections. Amb refer to podiatry. S/S reviewed when to reach out for abx. Can use epsom salt for releif and OTC analgesics as needed

## 2023-11-17 ENCOUNTER — Ambulatory Visit (INDEPENDENT_AMBULATORY_CARE_PROVIDER_SITE_OTHER): Payer: Medicare Other | Admitting: Podiatry

## 2023-11-17 DIAGNOSIS — L600 Ingrowing nail: Secondary | ICD-10-CM

## 2023-11-17 NOTE — Patient Instructions (Signed)

## 2023-11-17 NOTE — Progress Notes (Signed)
Subjective:  Patient ID: [name], female    DOB: [birth year],  MRN: [id number]  Chief Complaint  Patient presents with   Ingrown Toenail    Left hallux nail pt stated that she feels like her nail is ingrown     83 y.o. female presents with the above complaint.  Patient presents with left hallux medial border ingrown painful to touch is progressive and worse worse with ambulation worse with pressure patient would like for me to remove which she has not seen and was prior to seeing me pain scale 7 out of 10 dull aching nature   Review of Systems: Negative except as noted in the HPI. Denies N/V/F/Ch.  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   Personal history of radiation therapy 2020   right breast   Phlebitis of leg, right, superficial 1986/1989   x 2   RA (rheumatoid arthritis) (HCC)    Urinary incontinence     Current Outpatient Medications:    acetaminophen (TYLENOL) 500 MG tablet, Take 1,000 mg by mouth in the morning and at bedtime., Disp: , Rfl:    ampicillin (PRINCIPEN) 500 MG capsule, Take 1 capsule (500 mg total) by mouth 4 (four) times daily., Disp: 28 capsule, Rfl: 0   folic acid (FOLVITE) 1 MG tablet, Take 2 mg by mouth daily., Disp: , Rfl:    hydroxychloroquine (PLAQUENIL) 200 MG tablet, Take 200 mg by mouth 2 (two) times daily., Disp: , Rfl:    hydrOXYzine (ATARAX) 10 MG  tablet, Take by mouth., Disp: , Rfl:    losartan (COZAAR) 50 MG tablet, Take 1 tablet (50 mg total) by mouth daily., Disp: 90 tablet, Rfl: 0   methocarbamol (ROBAXIN) 500 MG tablet, Take 500-1,000 mg by mouth every 6 (six) hours as needed., Disp: , Rfl:    predniSONE (DELTASONE) 5 MG tablet, Take by mouth., Disp: , Rfl:    Vibegron (GEMTESA) 75 MG TABS, Take by mouth., Disp: , Rfl:    Vitamin D, Cholecalciferol, 25 MCG (1000 UT) TABS, Take 1 tablet by mouth 2 (two) times daily., Disp: , Rfl:  No current facility-administered medications for this visit.  Facility-Administered Medications Ordered in Other Visits:    regadenoson (LEXISCAN) injection SOLN 0.4 mg, 0.4 mg, Intravenous, Once, Hilty, Lisette Abu, MD   technetium tetrofosmin (TC-MYOVIEW) injection 32.7 millicurie, 32.7 millicurie, Intravenous, Once PRN, Hilty, Lisette Abu, MD  Social History   Tobacco Use  Smoking Status Former   Current packs/day: 0.00   Average packs/day: 0.5 packs/day for 11.0 years (5.5 ttl pk-yrs)   Types: Cigarettes   Start date: 13   Quit date: 10/06/1963   Years since quitting: 60.1  Smokeless Tobacco Never    Allergies  Allergen Reactions  Codeine Nausea And Vomiting   Hydrocodone Bit-Homatrop Mbr Other (See Comments)    Hallucinations    Hydrocodone Rash    Hallucinations   Objective:  There were no vitals filed for this visit. There is no height or weight on file to calculate BMI. Constitutional Well developed. Well nourished.  Vascular Dorsalis pedis pulses palpable bilaterally. Posterior tibial pulses palpable bilaterally. Capillary refill normal to all digits.  No cyanosis or clubbing noted. Pedal hair growth normal.  Neurologic Normal speech. Oriented to person, place, and time. Epicritic sensation to light touch grossly present bilaterally.  Dermatologic Painful ingrowing nail at medial nail borders of the hallux nail left. No other open wounds. No skin lesions.  Orthopedic:  Normal joint ROM without pain or crepitus bilaterally. No visible deformities. No bony tenderness.   Radiographs: None Assessment:   1. Ingrown left big toenail    Plan:  Patient was evaluated and treated and all questions answered.  Ingrown Nail, left -Patient elects to proceed with minor surgery to remove ingrown toenail removal today. Consent reviewed and signed by patient. -Ingrown nail excised. See procedure note. -Educated on post-procedure care including soaking. Written instructions provided and reviewed. -Patient to follow up in 2 weeks for nail check.  Procedure: Excision of Ingrown Toenail Location: Left 1st toe medial nail borders. Anesthesia: Lidocaine 1% plain; 1.5 mL and Marcaine 0.5% plain; 1.5 mL, digital block. Skin Prep: Betadine. Dressing: Silvadene; telfa; dry, sterile, compression dressing. Technique: Following skin prep, the toe was exsanguinated and a tourniquet was secured at the base of the toe. The affected nail border was freed, split with a nail splitter, and excised. Chemical matrixectomy was then performed with phenol and irrigated out with alcohol. The tourniquet was then removed and sterile dressing applied. Disposition: Patient tolerated procedure well. Patient to return in 2 weeks for follow-up.   No follow-ups on file.

## 2024-01-13 ENCOUNTER — Other Ambulatory Visit: Payer: Self-pay | Admitting: Nurse Practitioner

## 2024-01-13 MED ORDER — CETIRIZINE HCL 10 MG PO TABS: 10.0000 mg | ORAL_TABLET | Freq: Every day | ORAL | 11 refills | Status: DC

## 2024-01-13 NOTE — Telephone Encounter (Signed)
 Spoke with patient to verify request. Patient states she does not want a cream, she just wants a refill on her Cetirizine 10mg  pill.    Copied from CRM [id number]. Topic: Clinical - Medication Refill >> Jan 13, 2024  8:12 AM Aletta Edouard wrote: Most Recent Primary Care Visit:  Provider: Eden Emms  Department: [facility]  Visit Type: ACUTE  Date: 11/08/2023  Medication: itch cream cetritizine 10mg    Has the patient contacted their pharmacy? No (Agent: If no, request that the patient contact the pharmacy for the refill. If patient does not wish to contact the pharmacy document the reason why and proceed with request.) (Agent: If yes, when and what did the pharmacy advise?)  Is this the correct pharmacy for this prescription? Yes If no, delete pharmacy and type the correct one.  This is the patient's preferred pharmacy:  [facility] [address] [place], [place] - [address] [place] [address] Phone: [phone] Fax: [phone]    Has the prescription been filled recently? No  Is the patient out of the medication? Yes  Has the patient been seen for an appointment in the last year OR does the patient have an upcoming appointment? Yes  Can we respond through MyChart? No  Agent: Please be advised that Rx refills may take up to 3 business days. We ask that you follow-up with your pharmacy.

## 2024-01-17 ENCOUNTER — Other Ambulatory Visit: Payer: Self-pay | Admitting: Nurse Practitioner

## 2024-01-17 NOTE — Telephone Encounter (Signed)
 Patient called and advised cetrizine was called in to the pharmacy on 01/13/24. She says she spoke to the pharmacy and was told a new Rx will need to be sent in because the one they have is not valid, the phone in on 01/13/24 only was to approve, but no Rx. Advised I will send this to the provider to send in a new Rx. Patient says she needs this because she only has 1 pill left and she's itching.  Copied from CRM [id number]. Topic: Clinical - Medication Refill >> Jan 13, 2024  8:12 AM [name] wrote: Most Recent Primary Care Visit:  Provider: Dorothe Gaster  Department: [facility]  Visit Type: ACUTE  Date: 11/08/2023  Medication: itch cream cetritizine 10mg    Has the patient contacted their pharmacy? No (Agent: If no, request that the patient contact the pharmacy for the refill. If patient does not wish to contact the pharmacy document the reason why and proceed with request.) (Agent: If yes, when and what did the pharmacy advise?)  Is this the correct pharmacy for this prescription? Yes If no, delete pharmacy and type the correct one.  This is the patient's preferred pharmacy:  [facility] [address] [place], [place] - [address] [place] [address] Phone: [phone] Fax: [phone]    Has the prescription been filled recently? No  Is the patient out of the medication? Yes  Has the patient been seen for an appointment in the last year OR does the patient have an upcoming appointment? Yes  Can we respond through MyChart? No  Agent: Please be advised that Rx refills may take up to 3 business days. We ask that you follow-up with your pharmacy. >> Jan 17, 2024 10:29 AM [name] wrote: Patient said has one pill left >> Jan 17, 2024 10:28 AM [name] wrote: Please see my chart note from doctor.. Can we approve/send in for  Cetirizine HCl 10 mg Oral Daily.Aaron Aas

## 2024-01-18 ENCOUNTER — Telehealth: Payer: Self-pay

## 2024-01-18 ENCOUNTER — Other Ambulatory Visit: Payer: Self-pay | Admitting: Nurse Practitioner

## 2024-01-18 NOTE — Telephone Encounter (Signed)
 Follow up appointment with Dr.Newton on Monday 4/21.  Ms.Faiella called to update Dr.Newton on an upcoming dermatology appointment,  She reports having 3 biopsies,around rectum, done at Dr.Figlers office. 2 came back benign but one came back malignant. She reports having an appointment with a dermatologist, Dr.[name], at [facility] on Tuesday 4/22. Pt is asking if she needs to keep the appointment on 4/21?   Aware I will send message to Dr.Newton and will call her back.

## 2024-01-18 NOTE — Telephone Encounter (Signed)
 Ms.Carrick called office stating she prefers to keep appointment for Monday 4/21

## 2024-01-20 ENCOUNTER — Telehealth: Payer: Self-pay

## 2024-01-20 MED ORDER — CETIRIZINE HCL 10 MG PO TABS
10.0000 mg | ORAL_TABLET | Freq: Every day | ORAL | 11 refills | Status: DC
Start: 2024-01-20 — End: 2024-03-10

## 2024-01-20 NOTE — Addendum Note (Signed)
 Addended by: Dorothe Gaster on: 01/20/2024 12:43 PM   Modules accepted: Orders

## 2024-01-20 NOTE — Telephone Encounter (Signed)
 Prescription has been sent to the pharmacy.

## 2024-01-20 NOTE — Telephone Encounter (Signed)
 Contacted pt. Pt states that she was able to get the same medication OTC. Pt states she does not need it at this time.

## 2024-01-20 NOTE — Telephone Encounter (Signed)
 Copied from CRM [id number]. Topic: Clinical - Prescription Issue >> Jan 19, 2024 11:57 AM [name] wrote: Reason for CRM: Patient called in regarding itch cream cetritizine 10mg  , wanted to know the status of the prescriptiion would like for a nurse to give her  acallback regarding this    [phone]

## 2024-01-24 ENCOUNTER — Inpatient Hospital Stay: Payer: Medicare Other | Attending: Psychiatry | Admitting: Psychiatry

## 2024-01-24 ENCOUNTER — Other Ambulatory Visit: Payer: Self-pay | Admitting: Nurse Practitioner

## 2024-01-24 VITALS — BP 131/57 | HR 79 | Temp 97.6°F | Resp 16 | Ht 62.0 in | Wt 211.0 lb

## 2024-01-24 DIAGNOSIS — C519 Malignant neoplasm of vulva, unspecified: Secondary | ICD-10-CM

## 2024-01-24 DIAGNOSIS — Z9079 Acquired absence of other genital organ(s): Secondary | ICD-10-CM | POA: Insufficient documentation

## 2024-01-24 DIAGNOSIS — Z8544 Personal history of malignant neoplasm of other female genital organs: Secondary | ICD-10-CM | POA: Diagnosis not present

## 2024-01-24 DIAGNOSIS — I10 Essential (primary) hypertension: Secondary | ICD-10-CM

## 2024-01-24 DIAGNOSIS — R339 Retention of urine, unspecified: Secondary | ICD-10-CM | POA: Diagnosis not present

## 2024-01-24 NOTE — Progress Notes (Signed)
 Gynecologic Oncology Return Clinic Visit  Date of Service: 01/24/2024 Referring Provider: Dyanna Glasgow, DO  Plastic Surgeon: Dr. Agapito Alcide Urology: Perley Bradley, MD; Jerene Monks, MD  Assessment & Plan: [name] is a 83 y.o. 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 Agapito Alcide, postop course complicated by urinary retention currently with suprapubic catheter in place.  Recent biopsy of left perianal tissue demonstrates ongoing Paget's disease.  She presents today for follow-up.    Urinary retention: -Continue with suprapubic catheter at this time - Following with Dr. Rose Conception at [facility] - Considering surgery with Dr. Rose Conception to revise area of scarring, urethral opening  Vulvar Paget's disease: - Pathology without any evidence of invasive carcinoma. - Positive margins as was expected. - Has consult with Dr. Robert Chimes with Dermatology at [facility] upcoming. - Biopsy from 12/31/23 with ongoing paget's at left perianal area. Pt currently asymptomatic. Additional area of anterior vulva that has previously been noted and stable on exam may also possibly represent second area of ongoing involvement.  - Encouraged pt and family to keep Derm consult. Mohs surgery will likely be discussed. Also encouraged them to ask about topical treatments as well.  - Will tentatively schedule follow-up to continue with every 3 month surveillance but can adjust this as needed pending her decisions on possible reconstruction and Mohs surgery.  RTC 10mo  Derrel Flies, MD Gynecologic Oncology  Medical Decision Making I personally spent  TOTAL 30 minutes face-to-face and non-face-to-face in the care of this patient, which includes all pre, intra, and post visit time on the date of service.   ----------------------- Reason for Visit:  Follow-up  Treatment History: Oncology History  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/27/2019 Initial Diagnosis   Patient reported two weeks of spontaneous right nipple discharge. 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 Biopsy on 07/05/19 showed fibrocystic changes in the left breast, no evidence of malignancy. Breast MRI on 08/11/19 showed a 1.7cm right nipple base mass, a 0.9cm superior central right breast mass, and indeterminate non-mass enhancement in the central left breast. Biopsy on 08/24/19 showed no evidence of malignancy in the left breast, and in the right breast, DCIS with necrosis, high grade, ER+ 100%, PR+ 70%.    09/20/2019 Surgery   Bilateral lumpectomies ([facility]): Left breast: complex sclerosing lesion with no evidence of malignancy Right breast: nipple adenoma with intraductal papilloma and DCIS, intermediate grade, 0.4cm, clear margins.   11/28/2019 -  Radiation Therapy   Adjuvant radiation     Interval History: Patient presents today with her daughter.  Was most recently seen by Dr. Rose Conception at [facility] on 01/17/2024.  They discussed surgery to address scar tissue near her urethra with the goal of possibly initially removing suprapubic catheter and resuming spontaneous voiding.  She has also been referred to dermatology with Dr. Robert Chimes to discuss residual Paget's disease that is around the anus.  Currently denies any vulvar irritation or bleeding.  Overall doing okay right now with the suprapubic catheter but does have occasional bladder spasms.   Past Medical/Surgical History: Past Medical History:  Diagnosis Date   Ambulates with cane    Antiphospholipid antibody syndrome (HCC)    Breast cancer    right  Chest discomfort on left    when lies down, she has to sit up   chronic sob on occasion    Coronary artery calcification  seen on CT scan 04/15/2023   Dysrhythmia    PSVT/ palpitations-  controlled with prn Inderal    History of pulmonary embolism    2020 or 2021   Hyperlipidemia    statin intolerant   Hypertension    Interstitial lung disease (HCC)    Left arm numbness    at rest and with activity   Migraines    Obesity    Osteoarthritis    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   Personal history of radiation therapy 2020   right breast   Phlebitis of leg, right, superficial 1986/1989   x 2   RA (rheumatoid arthritis) (HCC)    Urinary incontinence     Past Surgical History:  Procedure Laterality Date   BOTOX  INJECTION N/A 04/20/2023   Procedure: BOTOX  INJECTION 100 UNITS;  Surgeon: Roxane Copp, MD;  Location: [facility];  Service: Urology;  Laterality: N/A;   BREAST BIOPSY Left 07/05/2019   BREAST BIOPSY Bilateral 08/24/2019   BREAST EXCISIONAL BIOPSY Left 09/20/2019   BREAST LUMPECTOMY Right 09/20/2019   BREAST LUMPECTOMY WITH RADIOACTIVE SEED LOCALIZATION Bilateral 09/20/2019   Procedure: BILATERAL BREAST LUMPECTOMIES WITH BILATERAL RADIOACTIVE SEED LOCALIZATION AND RIGHT NIPPLE BIOPSY;  Surgeon: Sim Dryer, MD;  Location: [facility];  Service: General;  Laterality: Bilateral;   CARDIAC CATHETERIZATION  1990s   negative   CARDIOVASCULAR STRESS TEST  2008   NML   CARDIOVASCULAR STRESS TEST  02/02/2007   EF 70%, NO ISCHEMIA   CARPAL TUNNEL RELEASE Bilateral    CARPAL TUNNEL RELEASE Bilateral    CYSTOSCOPY WITH INJECTION N/A 04/20/2023   Procedure: CYSTOSCOPY WITH INJECTION OF [place];  Surgeon: Roxane Copp, MD;  Location: [facility] [place];  Service: Urology;  Laterality: N/A;  45 MINS   DILATION AND CURETTAGE, DIAGNOSTIC / THERAPEUTIC  2008   LESION DESTRUCTION  10/2017   Face; Dr. Rochelle Chu   ROTATOR CUFF REPAIR  1999   Right, left in 2002   TOTAL KNEE ARTHROPLASTY  2008   Left   TOTAL KNEE ARTHROPLASTY  10/11/2012   Procedure:  TOTAL KNEE ARTHROPLASTY;  Surgeon: Bevin Bucks, MD;  Location: [facility];  Service: Orthopedics;  Laterality: Right;   US  ECHOCARDIOGRAPHY  02/07/2007   EF 55-60%    Family History  Problem Relation Age of Onset   Hypertension Mother    Heart attack Father 23   Hypertension Father    Peptic Ulcer Disease Father    Heart defect Sister    Lung cancer Brother    Coronary artery disease Other        Female 1st degree relative <50   Colon cancer Neg Hx    Prostate cancer Neg Hx    Ovarian cancer Neg Hx    Pancreatic cancer Neg Hx    Endometrial cancer Neg Hx    Breast cancer Neg Hx    BRCA 1/2 Neg Hx     Social History   Socioeconomic History   Marital status: Married    Spouse name: Not on file   Number of children: Not on file   Years of education: Not on file   Highest education level: Not on file  Occupational History   Occupation: Retired  Tobacco Use   Smoking status: Former    Current packs/day: 0.00  Average packs/day: 0.5 packs/day for 11.0 years (5.5 ttl pk-yrs)    Types: Cigarettes    Start date: 16    Quit date: 10/06/1963    Years since quitting: 60.3   Smokeless tobacco: Never  Vaping Use   Vaping status: Never Used  Substance and Sexual Activity   Alcohol use: No    Comment: stopped 5 years ago   Drug use: No   Sexual activity: Yes  Other Topics Concern   Not on file  Social History Narrative   Regular exercise: yes walks 1 mile a day   Diet: loves butter, fruit and veggies   Social Drivers of Health   Financial Resource Strain: Low Risk  (05/14/2023)   Received from [facility]   Overall Financial Resource Strain (CARDIA)    Difficulty of Paying Living Expenses: Not hard at all  Food Insecurity: No Food Insecurity (05/14/2023)   Received from [facility]   Hunger Vital Sign    Worried About Running Out of Food in the Last Year: Never true    Ran Out of Food in the Last Year: Never true  Transportation Needs: No Transportation Needs  (06/11/2023)   Received from [facility]   PRAPARE - Transportation    Lack of Transportation (Medical): No    Lack of Transportation (Non-Medical): No  Physical Activity: Sufficiently Active (02/25/2022)   Exercise Vital Sign    Days of Exercise per Week: 7 days    Minutes of Exercise per Session: 60 min  Stress: No Stress Concern Present (02/25/2022)   [employer] of Occupational Health - Occupational Stress Questionnaire    Feeling of Stress : Not at all  Social Connections: Not on file    Current Medications:  Current Outpatient Medications:    acetaminophen  (TYLENOL ) 500 MG tablet, Take 1,000 mg by mouth in the morning and at bedtime., Disp: , Rfl:    ampicillin  (PRINCIPEN) 500 MG capsule, Take 1 capsule (500 mg total) by mouth 4 (four) times daily., Disp: 28 capsule, Rfl: 0   cetirizine  (ZYRTEC ) 10 MG tablet, Take 1 tablet (10 mg total) by mouth daily., Disp: 30 tablet, Rfl: 11   folic acid  (FOLVITE ) 1 MG tablet, Take 2 mg by mouth daily., Disp: , Rfl:    hydroxychloroquine (PLAQUENIL) 200 MG tablet, Take 200 mg by mouth 2 (two) times daily., Disp: , Rfl:    hydrOXYzine  (ATARAX ) 10 MG tablet, Take by mouth., Disp: , Rfl:    losartan  (COZAAR ) 50 MG tablet, Take 1 tablet (50 mg total) by mouth daily., Disp: 90 tablet, Rfl: 0   methocarbamol  (ROBAXIN ) 500 MG tablet, Take 500-1,000 mg by mouth every 6 (six) hours as needed., Disp: , Rfl:    predniSONE  (DELTASONE ) 5 MG tablet, Take by mouth., Disp: , Rfl:    Vibegron (GEMTESA) 75 MG TABS, Take by mouth., Disp: , Rfl:    Vitamin D , Cholecalciferol, 25 MCG (1000 UT) TABS, Take 1 tablet by mouth 2 (two) times daily., Disp: , Rfl:  No current facility-administered medications for this visit.  Facility-Administered Medications Ordered in Other Visits:    regadenoson  (LEXISCAN ) injection SOLN 0.4 mg, 0.4 mg, Intravenous, Once, Hilty, Aviva Lemmings, MD   technetium tetrofosmin  (TC-MYOVIEW ) injection 32.7 millicurie, 32.7 millicurie,  Intravenous, Once PRN, Hilty, Aviva Lemmings, MD  Review of Symptoms: Complete 10-system review is positive for: hearing loss, leakage of urine  Physical Exam: BP (!) 131/57 (BP Location: Left Arm)   Pulse 79   Temp  97.6 F (36.4 C)   Resp 16   Ht 5\' 2"  (1.575 m)   Wt 211 lb (95.7 kg)   SpO2 96%   BMI 38.59 kg/m  General: Alert, oriented, no acute distress. HEENT: Normocephalic, atraumatic. Neck symmetric without masses. Sclera anicteric.  Chest: Normal work of breathing.  Abdomen: Soft, nontender.   Extremities: Grossly normal range of motion.  Warm. Erythema of bilateral lower extremities and skin changes c/w with venous stasis. Skin: No rashes or lesions noted. GU: External genitalia well-healed from prior vulvectomy. Erythema along scar anteriorly near mons, asymptomatic, stable.  Small vaginal opening. Suprapubic catheter in place. Unable to visualize the urethral opening. Able to pass fingertip through introitus to approximately the urethral opening. Erythema, inflamed at 2-4 o'clock near anal opening, asymptomatic, stable.  Exam chaperoned by Kimberly Swazil[place], CMA    Laboratory & Radiologic Studies: Surgical pathology (12/31/23): Diagnosis   A:Anterior perineum, biopsy: - Benign vulvar skin with underlying focal hemorrhage and foreign body material with associated reactive changes.   B: Posterior perineum, biopsy: - Benign stromal tissue. - No epithelial component seen despite reorientation of tissue and multiple deeper levels.    C: Left perianal tissue, biopsy: - Positive for extramammary Paget's disease - Diagnosis supported by CK7 (positive) and SOX10 (negative) immunohistochemistry

## 2024-01-24 NOTE — Patient Instructions (Signed)
 It was a pleasure to see you in clinic today. - Recommend the consultation with Dermatology. The anterior portion of the vulva may have a spot of paget's as well. - Would recommend follow-up every 3 months but we can move the appt pending what she decides to do regarding possible Moh's and possible reconstruction with Dr. Rose Conception.  Thank you very much for allowing me to provide care for you today.  I appreciate your confidence in choosing our Gynecologic Oncology team at [facility].  If you have any questions about your visit today please call our office or send us  a MyChart message and we will get back to you as soon as possible.

## 2024-02-01 ENCOUNTER — Encounter: Payer: Self-pay | Admitting: Psychiatry

## 2024-02-09 ENCOUNTER — Encounter: Payer: Medicare Other | Admitting: Nurse Practitioner

## 2024-03-10 ENCOUNTER — Encounter: Payer: Self-pay | Admitting: Family Medicine

## 2024-03-10 ENCOUNTER — Ambulatory Visit: Payer: Self-pay | Admitting: *Deleted

## 2024-03-10 ENCOUNTER — Telehealth: Payer: Self-pay | Admitting: Family Medicine

## 2024-03-10 ENCOUNTER — Ambulatory Visit: Admitting: Family Medicine

## 2024-03-10 ENCOUNTER — Ambulatory Visit (INDEPENDENT_AMBULATORY_CARE_PROVIDER_SITE_OTHER)
Admission: RE | Admit: 2024-03-10 | Discharge: 2024-03-10 | Disposition: A | Discharge status: Home / Self Care | Source: Ambulatory Visit | Attending: Family Medicine | Admitting: Family Medicine

## 2024-03-10 VITALS — BP 160/68 | HR 88 | Temp 98.9°F | Ht 62.0 in | Wt 211.0 lb

## 2024-03-10 DIAGNOSIS — I10 Essential (primary) hypertension: Secondary | ICD-10-CM | POA: Diagnosis not present

## 2024-03-10 DIAGNOSIS — R0602 Shortness of breath: Secondary | ICD-10-CM

## 2024-03-10 DIAGNOSIS — R051 Acute cough: Secondary | ICD-10-CM | POA: Diagnosis not present

## 2024-03-10 LAB — COMPREHENSIVE METABOLIC PANEL WITH GFR
ALT: 14 U/L (ref 0–35)
AST: 26 U/L (ref 0–37)
Albumin: 4.1 g/dL (ref 3.5–5.2)
Alkaline Phosphatase: 91 U/L (ref 39–117)
BUN: 12 mg/dL (ref 6–23)
CO2: 31 meq/L (ref 19–32)
Calcium: 8.8 mg/dL (ref 8.4–10.5)
Chloride: 103 meq/L (ref 96–112)
Creatinine, Ser: 0.71 mg/dL (ref 0.40–1.20)
GFR: 77.79 mL/min (ref >60.00)
Glucose, Bld: 86 mg/dL (ref 70–99)
Potassium: 4.2 meq/L (ref 3.5–5.1)
Sodium: 139 meq/L (ref 135–145)
Total Bilirubin: 0.4 mg/dL (ref 0.2–1.2)
Total Protein: 7.3 g/dL (ref 6.0–8.3)

## 2024-03-10 LAB — CBC WITH DIFFERENTIAL/PLATELET
Basophils Absolute: 0 10*3/uL (ref 0.0–0.1)
Basophils Relative: 0.2 % (ref 0.0–3.0)
Eosinophils Absolute: 0.1 10*3/uL (ref 0.0–0.7)
Eosinophils Relative: 2.2 % (ref 0.0–5.0)
HCT: 38 % (ref 36.0–46.0)
Hemoglobin: 12.7 g/dL (ref 12.0–15.0)
Lymphocytes Relative: 28 % (ref 12.0–46.0)
Lymphs Abs: 1.5 10*3/uL (ref 0.7–4.0)
MCHC: 33.6 g/dL (ref 30.0–36.0)
MCV: 86.6 fl (ref 78.0–100.0)
Monocytes Absolute: 0.6 10*3/uL (ref 0.1–1.0)
Monocytes Relative: 11.2 % (ref 3.0–12.0)
Neutro Abs: 3 10*3/uL (ref 1.4–7.7)
Neutrophils Relative %: 58.4 % (ref 43.0–77.0)
Platelets: 223 10*3/uL (ref 150.0–400.0)
RBC: 4.38 Mil/uL (ref 3.87–5.11)
RDW: 15.1 % (ref 11.5–15.5)
WBC: 5.2 10*3/uL (ref 4.0–10.5)

## 2024-03-10 LAB — POC COVID19 BINAXNOW: SARS Coronavirus 2 Ag: NEGATIVE

## 2024-03-10 MED ORDER — ALBUTEROL SULFATE HFA 108 (90 BASE) MCG/ACT IN AERS: 2.0000 | INHALATION_SPRAY | Freq: Four times a day (QID) | RESPIRATORY_TRACT | 2 refills | Status: DC | PRN

## 2024-03-10 MED ORDER — BENZONATATE 200 MG PO CAPS: 200.0000 mg | ORAL_CAPSULE | Freq: Two times a day (BID) | ORAL | 0 refills | Status: DC | PRN

## 2024-03-10 MED ORDER — AMOXICILLIN-POT CLAVULANATE 875-125 MG PO TABS: 1.0000 | ORAL_TABLET | Freq: Two times a day (BID) | ORAL | 0 refills | Status: DC

## 2024-03-10 MED ORDER — AZITHROMYCIN 250 MG PO TABS: ORAL_TABLET | ORAL | 0 refills | Status: DC

## 2024-03-10 MED ORDER — AMOXICILLIN-POT CLAVULANATE 875-125 MG PO TABS
1.0000 | ORAL_TABLET | Freq: Two times a day (BID) | ORAL | 0 refills | Status: DC
Start: 2024-03-10 — End: 2024-03-10

## 2024-03-10 NOTE — Assessment & Plan Note (Signed)
 Acute, will evaluate with labs including BNP, c-Met and CBC.  Evaluate with chest x-ray. No clear sign of change in fluid status today. History of DVT but no calf pain and no chest pain.

## 2024-03-10 NOTE — Telephone Encounter (Deleted)
 Copied from CRM [id number]. Topic: Clinical - Medication Question >> Mar 10, 2024  3:14 PM Allyne Areola wrote: Reason for CRM: Patient was seen earlier today and was prescribed antibiotics, She would like to know if Dr.Bedsole would also be able to send a cough medication for a lingering cough.

## 2024-03-10 NOTE — Telephone Encounter (Signed)
 Noted appreciate Dr. Millie Alm evaluation

## 2024-03-10 NOTE — Telephone Encounter (Signed)
 Copied from CRM [id number]. Topic: Clinical - Medication Question >> Mar 10, 2024  3:14 PM Allyne Areola wrote: Reason for CRM: Patient was seen earlier today and was prescribed antibiotics, She would like to know if Dr.Bedsole would also be able to send a cough medication for a lingering cough.

## 2024-03-10 NOTE — Assessment & Plan Note (Addendum)
 Acute, will evaluate with CBC and chest x-ray given patient higher risk with interstitial lung disease and age.   Neg COvID test. Chest x-ray returned showing concerns for right middle and lower lobe pneumonia. Will treat with course of broadened antibiotics. Macrolide and Augmentin. 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  At this point her lung exam does not show significant wheezing so I will provide her with an albuterol inhaler but we will hold off on repeat prednisone  taper.   Follow up in 48-72 hours if not improving.  Return and ER precautions provided.

## 2024-03-10 NOTE — Assessment & Plan Note (Signed)
 Blood pressure elevated in office today but is usually well-controlled.  This is likely due to recent decongestant use.  I asked her to stop decongestant and use plain Mucinex or Mucinex DM if needed.

## 2024-03-10 NOTE — Addendum Note (Signed)
 Addended by: Herby Lolling E on: 03/10/2024 03:34 PM   Modules accepted: Orders

## 2024-03-10 NOTE — Patient Instructions (Addendum)
Avoid decongestant.

## 2024-03-10 NOTE — Telephone Encounter (Signed)
       FYI Only or Action Required?: Action required by provider  Patient was last seen in primary care on 11/08/2023 by Dorothe Gaster, NP. Called Nurse Triage reporting Cough. Symptoms began several days ago/ 2 days ago . Interventions attempted: OTC medications: mucinex. Symptoms are: gradually worsening.  Triage Disposition: See HCP Within 4 Hours (Or PCP Triage)  Patient/caregiver understands and will follow disposition?: yes                     Copied from CRM [id number]. Topic: Appointments - Appointment Scheduling >> Mar 10, 2024  7:47 AM [name] wrote: Cold. Congestion. Cough - worsening Reason for Disposition  [1] MILD difficulty breathing (e.g., minimal/no SOB at rest, SOB with walking, pulse <100) AND [2] still present when not coughing  Answer Assessment - Initial Assessment Questions 1. ONSET: "When did the cough begin?"      2 days  2. SEVERITY: "How bad is the cough today?"      Worsening  3. SPUTUM: "Describe the color of your sputum" (none, dry cough; clear, white, yellow, green)     dry 4. HEMOPTYSIS: "Are you coughing up any blood?" If so ask: "How much?" (flecks, streaks, tablespoons, etc.)     Na 5. DIFFICULTY BREATHING: "Are you having difficulty breathing?" If Yes, ask: "How bad is it?" (e.g., mild, moderate, severe)    - MILD: No SOB at rest, mild SOB with walking, speaks normally in sentences, can lie down, no retractions, pulse < 100.    - MODERATE: SOB at rest, SOB with minimal exertion and prefers to sit, cannot lie down flat, speaks in phrases, mild retractions, audible wheezing, pulse 100-120.    - SEVERE: Very SOB at rest, speaks in single words, struggling to breathe, sitting hunched forward, retractions, pulse > 120      SOB after coughing spell denies chest pain  6. FEVER: "Do you have a fever?" If Yes, ask: "What is your temperature, how was it measured, and when did it start?"     No  7. CARDIAC HISTORY: "Do you have any history of  heart disease?" (e.g., heart attack, congestive heart failure)      Hx  8. LUNG HISTORY: "Do you have any history of lung disease?"  (e.g., pulmonary embolus, asthma, emphysema)     Hx  9. PE RISK FACTORS: "Do you have a history of blood clots?" (or: recent major surgery, recent prolonged travel, bedridden)     na 10. OTHER SYMPTOMS: "Do you have any other symptoms?" (e.g., runny nose, wheezing, chest pain)       Coughing spells, SOB runny nose clear mucus  11. PREGNANCY: "Is there any chance you are pregnant?" "When was your last menstrual period?"       na 12. TRAVEL: "Have you traveled out of the country in the last month?" (e.g., travel history, exposures)       Na  No available appt with PCP. Scheduled with other provider due to SOB for today . Recommended cough drops hard candy and warm liquids for coughing spells.  Protocols used: Cough - Acute Non-Productive-A-AH

## 2024-03-10 NOTE — Telephone Encounter (Signed)
 Call patient.  Let her know I sent in prescription for benzonatate  200 mg p.o. twice daily to use for cough.  If she requests a cough syrup with codeine at bedtime, let me know and I can send this in. Also please make sure she understands she needs to take both Augmentin and azithromycin  as the combination of these covers community-acquired pneumonia.

## 2024-03-10 NOTE — Progress Notes (Signed)
 Patient ID: [name], female    DOB: 17-May-1941, 83 y.o.   MRN: [id number]  This visit was conducted in person.  BP (!) 160/68   Pulse 88   Temp 98.9 F (37.2 C) (Temporal)   Ht 5\' 2"  (1.575 m)   Wt 211 lb (95.7 kg)   SpO2 96%   BMI 38.59 kg/m    CC:  Chief Complaint  Patient presents with   Cough   Wheezing   Shortness of Breath    Subjective:   HPI: [name] is a 83 y.o. female presenting on 03/10/2024 for Cough, Wheezing, and Shortness of Breath  At  baseline until 2 days ago  Date of onset:  2-3 days Initial symptoms included  coughing fits, dry Symptoms progressed to SOB and wheeze No fever.  No ear pain,   head congestion and face pain  No ST.  She has just finished pred taper for left elbow pain ( consistent with ulnar neuropathy) did not help much    No pain or change in swelling of legs.  No chest pain   Sick contacts:  none COVID testing:   none     She has tried to treat with Mucinex D.. causing nausea and possible increase BP.     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 Former smoker.     BP Readings from Last 3 Encounters:  03/10/24 (!) 160/68  01/24/24 (!) 131/57  11/08/23 102/78     Relevant past medical, surgical, family and social history reviewed and updated as indicated. Interim medical history since our last visit reviewed. Allergies and medications reviewed and updated. Outpatient Medications Prior to Visit  Medication Sig Dispense Refill   acetaminophen  (TYLENOL ) 500 MG tablet Take 1,000 mg by mouth in the morning and at bedtime.     folic acid  (FOLVITE ) 1 MG tablet Take 2 mg by mouth daily.     hydroxychloroquine (PLAQUENIL) 200 MG tablet Take 200 mg by mouth 2 (two) times daily.     losartan  (COZAAR ) 50 MG tablet Take 1 tablet by mouth once daily 90 tablet 0   ampicillin  (PRINCIPEN) 500 MG capsule Take 1 capsule (500 mg total) by mouth 4  (four) times daily. 28 capsule 0   cetirizine  (ZYRTEC ) 10 MG tablet Take 1 tablet (10 mg total) by mouth daily. 30 tablet 11   hydrOXYzine  (ATARAX ) 10 MG tablet Take by mouth.     methocarbamol  (ROBAXIN ) 500 MG tablet Take 500-1,000 mg by mouth every 6 (six) hours as needed.     predniSONE  (DELTASONE ) 5 MG tablet Take by mouth. (Patient not taking: Reported on 03/10/2024)     Vibegron (GEMTESA) 75 MG TABS Take by mouth. (Patient not taking: Reported on 03/10/2024)     Vitamin D , Cholecalciferol, 25 MCG (1000 UT) TABS Take 1 tablet by mouth 2 (two) times daily.     Facility-Administered Medications Prior to Visit  Medication Dose Route Frequency Provider Last Rate Last Admin   regadenoson  (LEXISCAN ) injection SOLN 0.4 mg  0.4 mg Intravenous Once Hilty, Kenneth C, MD       technetium tetrofosmin  (TC-MYOVIEW ) injection 32.7 millicurie  32.7 millicurie Intravenous Once PRN Hilty, Aviva Lemmings, MD         Per HPI unless specifically indicated in ROS section below Review of Systems  Constitutional:  Negative for fatigue and fever.  HENT:  Positive for congestion.   Eyes:  Negative  for pain.  Respiratory:  Positive for cough, shortness of breath and wheezing.   Cardiovascular:  Negative for chest pain, palpitations and leg swelling.  Gastrointestinal:  Negative for abdominal pain.  Genitourinary:  Negative for dysuria and vaginal bleeding.  Musculoskeletal:  Negative for back pain.  Neurological:  Negative for syncope, light-headedness and headaches.  Psychiatric/Behavioral:  Negative for dysphoric mood.    Objective:  BP (!) 160/68   Pulse 88   Temp 98.9 F (37.2 C) (Temporal)   Ht 5\' 2"  (1.575 m)   Wt 211 lb (95.7 kg)   SpO2 96%   BMI 38.59 kg/m   Wt Readings from Last 3 Encounters:  03/10/24 211 lb (95.7 kg)  01/24/24 211 lb (95.7 kg)  11/08/23 207 lb (93.9 kg)      Physical Exam Constitutional:      General: She is not in acute distress.    Appearance: Normal appearance. She is  well-developed. She is not ill-appearing or toxic-appearing.  HENT:     Head: Normocephalic.     Right Ear: Hearing, tympanic membrane, ear canal and external ear normal. Tympanic membrane is not erythematous, retracted or bulging.     Left Ear: Hearing, tympanic membrane, ear canal and external ear normal. Tympanic membrane is not erythematous, retracted or bulging.     Nose: No mucosal edema or rhinorrhea.     Right Sinus: No maxillary sinus tenderness or frontal sinus tenderness.     Left Sinus: No maxillary sinus tenderness or frontal sinus tenderness.     Mouth/Throat:     Pharynx: Uvula midline.  Eyes:     General: Lids are normal. Lids are everted, no foreign bodies appreciated.     Conjunctiva/sclera: Conjunctivae normal.     Pupils: Pupils are equal, round, and reactive to light.  Neck:     Thyroid : No thyroid  mass or thyromegaly.     Vascular: No carotid bruit.     Trachea: Trachea normal.  Cardiovascular:     Rate and Rhythm: Normal rate and regular rhythm.     Pulses: Normal pulses.     Heart sounds: Normal heart sounds, S1 normal and S2 normal. No murmur heard.    No friction rub. No gallop.  Pulmonary:     Effort: Pulmonary effort is normal. No tachypnea or respiratory distress.     Breath sounds: Decreased breath sounds present. No wheezing, rhonchi or rales.  Abdominal:     General: Bowel sounds are normal.     Palpations: Abdomen is soft.     Tenderness: There is no abdominal tenderness.  Musculoskeletal:     Cervical back: Normal range of motion and neck supple.  Skin:    General: Skin is warm and dry.     Findings: No rash.  Neurological:     Mental Status: She is alert.  Psychiatric:        Mood and Affect: Mood is not anxious or depressed.        Speech: Speech normal.        Behavior: Behavior normal. Behavior is cooperative.        Thought Content: Thought content normal.        Judgment: Judgment normal.       Results for orders placed or  performed in visit on 03/10/24  POC COVID-19   Collection Time: 03/10/24 11:53 AM  Result Value Ref Range   SARS Coronavirus 2 Ag Negative Negative    Assessment and Plan  SOB (shortness of  breath) Assessment & Plan: Acute, will evaluate with labs including BNP, c-Met and CBC.  Evaluate with chest x-ray. No clear sign of change in fluid status today. History of DVT but no calf pain and no chest pain.  Orders: -     CBC with Differential/Platelet -     Brain natriuretic peptide -     Comprehensive metabolic panel with GFR -     DG Chest 2 View; Future  Acute cough Assessment & Plan: Acute, will evaluate with CBC and chest x-ray given patient higher risk with interstitial lung disease and age.  Chest x-ray returned showing concerns for right middle and lower lobe pneumonia. Will treat with course of broadened antibiotics. Macrolide and Augmentin. 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  At this point her lung exam does not show significant wheezing so I will provide her with an albuterol inhaler but we will hold off on repeat prednisone  taper.   Follow up in 48-72 hours if not improving.  Return and ER precautions provided.  Orders: -     POC COVID-19 BinaxNow  Essential hypertension, benign Assessment & Plan: Blood pressure elevated in office today but is usually well-controlled.  This is likely due to recent decongestant use.  I asked her to stop decongestant and use plain Mucinex or Mucinex DM if needed.   Other orders -     Albuterol Sulfate HFA; Inhale 2 puffs into the lungs every 6 (six) hours as needed for wheezing or shortness of breath.  Dispense: 8 g; Refill: 2 -     Azithromycin ; 2 tab po x 1 day then 1 tab po daily  Dispense: 6 tablet; Refill: 0 -     Amoxicillin -Pot Clavulanate; Take 1 tablet by mouth 2 (two) times daily.  Dispense: 20 tablet; Refill: 0    No follow-ups on file.   Herby Lolling, MD

## 2024-03-10 NOTE — Telephone Encounter (Signed)
 Left message for Mrs. Vira Grieves that  Dr. Cherlyn Cornet sent in a prescription for benzonatate  200 mg p.o. twice daily to use for cough.  If she would like a  cough syrup with codeine to use  at bedtime, to please call us  back and let us  know and Dr. Bedsole can send this in as well.  I also let her know that she needs to take both the Augmentin and azithromycin  as the combination as these covers community-acquired pneumonia.

## 2024-03-13 ENCOUNTER — Ambulatory Visit: Payer: Self-pay

## 2024-03-13 LAB — BRAIN NATRIURETIC PEPTIDE: Pro B Natriuretic peptide (BNP): 34 pg/mL (ref 0.0–100.0)

## 2024-03-13 NOTE — Telephone Encounter (Signed)
 This RN spoke with patient and patient reports current reading is 92% and HR is 75. Patient does not take home oxygen. This RN educated patient if she drops below 90% she needs to go to ED for evaluation, immediately. Patient is requesting for supplemental oxygen to be delivered. Patient states her O2 is now at 89%. This RN recommended ED right now. Patient states she will have someone take her.      Copied from CRM [id number]. Topic: Clinical - Red Word Triage >> Mar 13, 2024  4:57 PM Corin V wrote: [facility] that prompted transfer to Nurse Triage: Patient called back to ask about medication changes/refill from call earlier today. She reported her pulse ox is stating pulse is 92 and oxygen is 73.

## 2024-03-13 NOTE — Telephone Encounter (Signed)
 FYI Only or Action Required?: Action required by provider  Patient was last seen in primary care on 03/10/2024 by Judithann Novas, MD. Called Nurse Triage reporting Pneumonia. Symptoms began several days ago. Interventions attempted: OTC medications: Advil  and Prescription medications: Augmentin , Azithromycin , albuterol  inhaler, Benzonatate . Symptoms are: cough, yellow sputum, SpO2 91-93 %, SOB after coughing fits"about the same but cough has worsened".  Triage Disposition: Call PCP Now  Patient/caregiver understands and will follow disposition?: Yes                 Copied from CRM [id number]. Topic: Clinical - Medical Advice >> Mar 13, 2024  1:37 PM Abigail D wrote: Reason for CRM: Prescribed medication for pneumonia, only has 1 pill today and 2 for tomorrow, patient not seeing much improvement in symptoms and not sure what to do. Reason for Disposition  Oxygen level (e.g., pulse oximetry) 91 to 94 percent  Answer Assessment - Initial Assessment Questions 1. SYMPTOM: "What's the main symptom you're concerned about?" (e.g., breathing difficulty, fever, weakness)     Cough.  2. ONSET: "When did the  symptoms  start?"     03/09/24.  3. BETTER-SAME-WORSE: "Are you getting better, staying the same, or getting worse compared to the day you were discharged?"     The same, maybe a little worse due to coughing more.  4. BREATHING DIFFICULTY: "Are you having any difficulty breathing?" If Yes, ask: "How bad is it?"  (e.g., none, mild, moderate, severe)   - MILD: No SOB at rest, mild SOB with walking, speaks normally in sentences, can lie down, no retractions, pulse < 100.    - MODERATE: SOB at rest, SOB with minimal exertion and prefers to sit, cannot lie down flat, speaks in phrases, mild retractions, audible wheezing, pulse 100-120.    - SEVERE: Very SOB at rest, speaks in single words, struggling to breathe, sitting hunched forward, retractions, pulse > 120      No SOB at rest but does  feel SOB after coughing fits.  5. FEVER: "Do you have a fever?" If Yes, ask: "What is your temperature, how was it measured, and when did it start?"     No.  6. SPUTUM: "Describe the color of your sputum" (clear, white, yellow, green, blood-tinged)     Was clear and now yellow.  7. DIAGNOSIS CONFIRMATION: "When was the pneumonia diagnosed?" "By whom?"     03/10/24.  8. ANTIBIOTIC: "Are you taking an antibiotic?"  If Yes, ask: "Which one?" "When was it started?"     Augmentin  and azithromycin . Taking both, started both on 03/10/24.  9. OTHER TREATMENT: "Are you receiving any other treatment for the pneumonia?" (e.g., albuterol  nebulizer, oxygen) If Yes, ask: "How often?" and "Does it help?"     Albuterol  inhaler.  10. HOSPITAL ADMISSION: "Were you hospitalized for this pneumonia?" If Yes, ask: "When were you discharged home from the hospital?"       No.  11. O2 SATURATION MONITOR:  "Do you use an oxygen saturation monitor (pulse oximeter) at home?" If Yes, "What is your reading (oxygen level) today?" "What is your usual oxygen saturation reading?" (e.g., 95%)       91-93%.  Protocols used: Pneumonia Follow-up Call-A-AH

## 2024-03-14 ENCOUNTER — Telehealth: Payer: Self-pay

## 2024-03-14 MED ORDER — PROMETHAZINE-DM 6.25-15 MG/5ML PO SYRP: 5.0000 mL | ORAL_SOLUTION | Freq: Every evening | ORAL | 0 refills | Status: DC | PRN

## 2024-03-14 MED ORDER — PREDNISONE 20 MG PO TABS: ORAL_TABLET | ORAL | 0 refills | Status: DC

## 2024-03-14 NOTE — Telephone Encounter (Signed)
 Given pt refuses ER eval or urgent care eval today.Rachel AasAaron AasWill send in rx for cough syrup to use at night and prednisone  taper.  Start prednisone  tommorow AM.  If she would prefer to use her home prednisone  5 mg she would have to use 6 tabs on 1st 3 days then 4 tabs x 3 days then 2 tabs x 2 days

## 2024-03-14 NOTE — Telephone Encounter (Signed)
 Left message for [name] to return call to office.

## 2024-03-14 NOTE — Telephone Encounter (Signed)
 [name] notified as instructed by telephone.  Patient recently checked her pulse ox which is still 90%.  I really encouraged her to be reevaluated at the ER.  Patient wants to talk it over with Mr. Belk and will call me back with what she plans on doing.

## 2024-03-14 NOTE — Telephone Encounter (Signed)
 Mrs. Burtch called back and states she will just wing it another night and hopefully she will start feeling better tomorrow.  I ask if she wants Dr. Cherlyn Cornet to send in Rx for prednisone  for her to try.  She states she has Prednisone  5 mg tablets at home that she is not currently taking.  Please advise.

## 2024-03-14 NOTE — Telephone Encounter (Signed)
 Mrs. Kanaan notified as instructed by telephone.  Follow up appointment with Dr. Cherlyn Cornet scheduled for 03/16/2024 at 10:40 am.  ER precautions given.  Patient states understanding.

## 2024-03-14 NOTE — Telephone Encounter (Signed)
 Copied from CRM [id number]. Topic: General - Other >> Mar 14, 2024  1:04 PM Adonis Hoot wrote: Reason for CRM: Patient  called in stating that she is coughing really bad,and would like to know if she could be prescribed medication to help with the cough?   Looks like patient was triaged and advised to go to ED yesterday did not see where she was seen. Also see where script was sent in on 6/6.

## 2024-03-14 NOTE — Telephone Encounter (Signed)
 See other MyChart message

## 2024-03-15 ENCOUNTER — Ambulatory Visit: Payer: Self-pay | Admitting: Family Medicine

## 2024-03-16 ENCOUNTER — Ambulatory Visit: Payer: Self-pay | Admitting: Family Medicine

## 2024-03-16 ENCOUNTER — Ambulatory Visit: Admitting: Family Medicine

## 2024-03-16 ENCOUNTER — Encounter: Payer: Self-pay | Admitting: Family Medicine

## 2024-03-16 ENCOUNTER — Emergency Department (HOSPITAL_BASED_OUTPATIENT_CLINIC_OR_DEPARTMENT_OTHER)

## 2024-03-16 ENCOUNTER — Telehealth: Payer: Self-pay

## 2024-03-16 ENCOUNTER — Observation Stay (HOSPITAL_BASED_OUTPATIENT_CLINIC_OR_DEPARTMENT_OTHER)
Admission: EM | Admit: 2024-03-16 | Discharge: 2024-03-18 | Disposition: A | Discharge status: Home / Self Care | Attending: Internal Medicine | Admitting: Internal Medicine

## 2024-03-16 VITALS — BP 130/78 | HR 99 | Temp 98.5°F | Wt 206.4 lb

## 2024-03-16 DIAGNOSIS — J189 Pneumonia, unspecified organism: Secondary | ICD-10-CM

## 2024-03-16 DIAGNOSIS — M069 Rheumatoid arthritis, unspecified: Secondary | ICD-10-CM | POA: Insufficient documentation

## 2024-03-16 DIAGNOSIS — Z79899 Other long term (current) drug therapy: Secondary | ICD-10-CM | POA: Diagnosis not present

## 2024-03-16 DIAGNOSIS — I10 Essential (primary) hypertension: Secondary | ICD-10-CM | POA: Insufficient documentation

## 2024-03-16 DIAGNOSIS — E785 Hyperlipidemia, unspecified: Secondary | ICD-10-CM | POA: Diagnosis present

## 2024-03-16 DIAGNOSIS — D72829 Elevated white blood cell count, unspecified: Secondary | ICD-10-CM | POA: Diagnosis not present

## 2024-03-16 DIAGNOSIS — J9600 Acute respiratory failure, unspecified whether with hypoxia or hypercapnia: Secondary | ICD-10-CM | POA: Insufficient documentation

## 2024-03-16 DIAGNOSIS — Z853 Personal history of malignant neoplasm of breast: Secondary | ICD-10-CM | POA: Insufficient documentation

## 2024-03-16 DIAGNOSIS — R7303 Prediabetes: Secondary | ICD-10-CM | POA: Diagnosis present

## 2024-03-16 DIAGNOSIS — J849 Interstitial pulmonary disease, unspecified: Secondary | ICD-10-CM

## 2024-03-16 DIAGNOSIS — E66813 Obesity, class 3: Secondary | ICD-10-CM | POA: Diagnosis not present

## 2024-03-16 DIAGNOSIS — R600 Localized edema: Secondary | ICD-10-CM | POA: Diagnosis present

## 2024-03-16 DIAGNOSIS — I2699 Other pulmonary embolism without acute cor pulmonale: Secondary | ICD-10-CM | POA: Diagnosis not present

## 2024-03-16 DIAGNOSIS — I2694 Multiple subsegmental pulmonary emboli without acute cor pulmonale: Principal | ICD-10-CM

## 2024-03-16 DIAGNOSIS — R0602 Shortness of breath: Secondary | ICD-10-CM

## 2024-03-16 LAB — CBC WITH DIFFERENTIAL/PLATELET
Absolute Lymphocytes: 1247 {cells}/uL (ref 850–3900)
Absolute Monocytes: 376 {cells}/uL (ref 200–950)
Basophils Absolute: 10 {cells}/uL (ref 0–200)
Basophils Relative: 0.1 %
Eosinophils Absolute: 40 {cells}/uL (ref 15–500)
Eosinophils Relative: 0.4 %
HCT: 40 % (ref 35.0–45.0)
Hemoglobin: 12.9 g/dL (ref 11.7–15.5)
MCH: 28.6 pg (ref 27.0–33.0)
MCHC: 32.3 g/dL (ref 32.0–36.0)
MCV: 88.7 fL (ref 80.0–100.0)
MPV: 10.8 fL (ref 7.5–12.5)
Monocytes Relative: 3.8 %
Neutro Abs: 8227 {cells}/uL — ABNORMAL HIGH (ref 1500–7800)
Neutrophils Relative %: 83.1 %
Platelets: 209 10*3/uL (ref 140–400)
RBC: 4.51 10*6/uL (ref 3.80–5.10)
RDW: 13.4 % (ref 11.0–15.0)
Total Lymphocyte: 12.6 %
WBC: 9.9 10*3/uL (ref 3.8–10.8)

## 2024-03-16 LAB — BASIC METABOLIC PANEL WITH GFR
BUN: 14 mg/dL (ref 7–25)
CO2: 26 mmol/L (ref 20–32)
Calcium: 9.6 mg/dL (ref 8.6–10.4)
Chloride: 104 mmol/L (ref 98–110)
Creat: 0.77 mg/dL (ref 0.60–0.95)
Glucose, Bld: 126 mg/dL — ABNORMAL HIGH (ref 65–99)
Potassium: 4.9 mmol/L (ref 3.5–5.3)
Sodium: 140 mmol/L (ref 135–146)
eGFR: 76 mL/min/{1.73_m2} (ref >=60)

## 2024-03-16 LAB — LACTIC ACID, PLASMA: Lactic Acid, Venous: 1.3 mmol/L (ref 0.5–1.9)

## 2024-03-16 LAB — D-DIMER, QUANTITATIVE: D-Dimer, Quant: 16.23 ug{FEU}/mL — ABNORMAL HIGH (ref <0.50)

## 2024-03-16 LAB — PRO BRAIN NATRIURETIC PEPTIDE: Pro Brain Natriuretic Peptide: 255 pg/mL (ref <300.0)

## 2024-03-16 MED ORDER — IOHEXOL 350 MG/ML SOLN
80.0000 mL | Freq: Once | INTRAVENOUS | Status: AC | PRN
  Administered 2024-03-16: 80 mL via INTRAVENOUS

## 2024-03-16 MED ORDER — HEPARIN BOLUS VIA INFUSION
4000.0000 [IU] | Freq: Once | INTRAVENOUS | Status: AC
  Administered 2024-03-16: 4000 [IU] via INTRAVENOUS

## 2024-03-16 MED ORDER — HEPARIN (PORCINE) 25000 UT/250ML-% IV SOLN
1250.0000 [IU]/h | INTRAVENOUS | Status: DC
  Administered 2024-03-16: 1150 [IU]/h via INTRAVENOUS
  Filled 2024-03-16 (×3): qty 250

## 2024-03-16 MED ORDER — DOXYCYCLINE HYCLATE 100 MG PO TABS
100.0000 mg | ORAL_TABLET | Freq: Two times a day (BID) | ORAL | 0 refills | Status: DC
Start: 2024-03-16 — End: 2024-03-18

## 2024-03-16 NOTE — Progress Notes (Signed)
 PHARMACY - ANTICOAGULATION CONSULT NOTE  Pharmacy Consult for heparin  Indication: pulmonary embolus  Allergies  Allergen Reactions   Codeine Nausea And Vomiting   Hydrocodone  Bit-Homatrop Mbr Other (See Comments)    Hallucinations    Hydrocodone  Rash    Hallucinations    Patient Measurements:  Heparin  Dosing wt: 71.91kg     Vital Signs: Temp: 98.3 F (36.8 C) (06/12 1938) Temp Source: Oral (06/12 1938) BP: 143/83 (06/12 1936) Pulse Rate: 89 (06/12 1944)  Labs: Recent Labs    03/16/24 1145  HGB 12.9  HCT 40.0  PLT 209  CREATININE 0.77    Estimated Creatinine Clearance: 55.8 mL/min (by C-G formula based on SCr of 0.77 mg/dL).   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 Pertinent PMH of antiphospholipid syndrome. Was previously on xarelto  for hx PE (2021-?09/2023). Also appears to have been on apixaban at some period?   Not on PTA anticoagulation at this time. Per RN report from patient, she has not been on [facility] for the past 7 months. Pharmacy has been consulted for heparin  dosing.   Goal of Therapy:  Heparin  level 0.3-0.7  units/ml Monitor platelets by anticoagulation protocol: Yes   Plan:  Give 4000 units bolus x 1 Start heparin  infusion at 1150 units/hr Check anti-Xa level in 8 hours and daily while on heparin   Laurna Shetley 03/16/2024,9:00 PM

## 2024-03-16 NOTE — ED Notes (Signed)
 RT called to assess pt. Pt experiencing dyspnea at rest. Pt states she sees Ramaswamy w/Pulm for ILD r/t her Rheumatoid Arthritis and is more SOB than usual. BLBS clr/clr-dim w/no distress noted at this time. Family/Pt state she believes she has another PE along w/PNA. RT reassured pt that her respiratory status is stable on RA and vital signs are WDL.    03/16/24 1944  Therapy Vitals  Pulse Rate 89  Resp 18  MEWS Score/Color  MEWS Score 0  MEWS Score Color Green  Respiratory Assessment  Assessment Type Assess only  Respiratory Pattern Regular;Unlabored;Symmetrical;Dyspnea at rest  Chest Assessment Chest expansion symmetrical  Cough None  Bilateral Breath Sounds Clear;Diminished  R Upper  Breath Sounds Clear  L Upper Breath Sounds Clear  R Lower Breath Sounds Clear;Diminished  L Lower Breath Sounds Clear;Diminished  Oxygen Therapy/Pulse Ox  O2 Device Room Air  O2 Therapy Room air  SpO2 95 %

## 2024-03-16 NOTE — Progress Notes (Signed)
 11:32 am patient's SpO2 was 92% in room air at rest 11:34 am patient's SpO2 was 90% while walking

## 2024-03-16 NOTE — Patient Instructions (Addendum)
 Complete prednisone  taper.  Complete Augmentin .  Rest, push fluids.  Will add doxycyline course.  Let me know if you are agreeable to starting levofloxacin  750 for broader antibiotic course given you are not substantially better.  We will  check labs and let you know if CT scan needed.

## 2024-03-16 NOTE — Progress Notes (Signed)
 May order   Patient ID: [name], female    DOB: Jan 25, 1941, 83 y.o.   MRN: [id number]  This visit was conducted in person.  BP 130/78 (BP Location: Left Arm, Patient Position: Sitting, Cuff Size: Large)   Pulse 99   Temp 98.5 F (36.9 C) (Oral)   Wt 206 lb 6.4 oz (93.6 kg)   SpO2 93%   BMI 37.75 kg/m   Liver CC:  Chief Complaint  Patient presents with   Pneumonia    Follow uo    Subjective:   HPI: [name] is a 83 y.o. female presenting on 03/16/2024 for Pneumonia (Follow uo)  At last OV 6/6 she was diagnosed with RM and Lower lobe PNA... Started on azithromycin  and  Augmentin . She called with continue cough and  shortness of breath and was asked to consider ED visit given she did have an O2 sat down to 89%.  Patient refused ER evaluation and scheduled follow-up appointment in the office today. On June 10 prednisone  taper was started. She was prescribed promethazine  dextromethorphan cough syrup to help with cough at night.  Today she reports she is slightly better since   Cough med making her cough more.  She has more energy.  At home in last day  O2 sat 90%  Drinking lots of liquids. Blood pressure and oxygen saturation are normal but heart rate is elevated at 99.  She is afebrile.  She denies confusion. She does not meet criteria for  early sepsis   Chest tightness, not  chest pain.  Not moving around much as much as usual... trying to go on walks. No calf pain.    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 Dr. Gudena recommended she did not need lifelong anticoagulation.  Relevant past medical, surgical, family and social history reviewed and updated as indicated. Interim medical history since our last visit reviewed. Allergies and medications reviewed and updated. Outpatient Medications Prior to Visit  Medication Sig Dispense Refill   acetaminophen  (TYLENOL ) 500 MG tablet Take 500 mg by mouth every 4 (four) hours as needed for mild pain (pain score 1-3)  or moderate pain (pain score 4-6).     folic acid  (FOLVITE ) 1 MG tablet Take 2 mg by mouth daily.     hydroxychloroquine  (PLAQUENIL ) 200 MG tablet Take 200 mg by mouth 2 (two) times daily.     losartan  (COZAAR ) 50 MG tablet Take 1 tablet by mouth once daily 90 tablet 0   albuterol  (VENTOLIN  HFA) 108 (90 Base) MCG/ACT inhaler Inhale 2 puffs into the lungs every 6 (six) hours as needed for wheezing or shortness of breath. 8 g 2   amoxicillin -clavulanate (AUGMENTIN ) 875-125 MG tablet Take 1 tablet by mouth 2 (two) times daily. (Patient taking differently: Take 1 tablet by mouth 2 (two) times daily.) 20 tablet 0   azithromycin  (ZITHROMAX ) 250 MG tablet 2 tab po x 1 day then 1 tab po daily (Patient not taking: Reported on 03/17/2024) 6 tablet 0   benzonatate  (TESSALON ) 200 MG capsule Take 1 capsule (200 mg total) by mouth 2 (two) times daily as needed for cough. 20 capsule 0   predniSONE  (DELTASONE ) 20 MG tablet 3 tabs by mouth daily x 3 days, then 2 tabs by mouth daily x 2 days then 1 tab by mouth daily x 2 days (Patient taking differently: Take 20-60 mg by mouth See admin instructions. Take three tablets by mouth for three days. Take two tablets by mouth  for two days. Take one tablet by mouth for two days.) 15 tablet 0   promethazine -dextromethorphan (PROMETHAZINE -DM) 6.25-15 MG/5ML syrup Take 5 mLs by mouth at bedtime as needed for cough. (Patient not taking: Reported on 03/20/2024) 118 mL 0   Facility-Administered Medications Prior to Visit  Medication Dose Route Frequency Provider Last Rate Last Admin   regadenoson  (LEXISCAN ) injection SOLN 0.4 mg  0.4 mg Intravenous Once Hilty, Kenneth C, MD       technetium tetrofosmin  (TC-MYOVIEW ) injection 32.7 millicurie  32.7 millicurie Intravenous Once PRN Hilty, Vinie BROCKS, MD         Per HPI unless specifically indicated in ROS section below Review of Systems  Constitutional:  Positive for fatigue. Negative for fever.  HENT:  Positive for congestion.    Eyes:  Negative for pain.  Respiratory:  Positive for cough and shortness of breath.   Cardiovascular:  Negative for chest pain, palpitations and leg swelling.  Gastrointestinal:  Negative for abdominal pain.  Genitourinary:  Negative for dysuria and vaginal bleeding.  Musculoskeletal:  Negative for back pain.  Neurological:  Negative for syncope, light-headedness and headaches.  Psychiatric/Behavioral:  Negative for dysphoric mood.    Objective:  BP 130/78 (BP Location: Left Arm, Patient Position: Sitting, Cuff Size: Large)   Pulse 99   Temp 98.5 F (36.9 C) (Oral)   Wt 206 lb 6.4 oz (93.6 kg)   SpO2 93%   BMI 37.75 kg/m   Wt Readings from Last 3 Encounters:  04/05/24 208 lb 6.4 oz (94.5 kg)  03/31/24 207 lb 6 oz (94.1 kg)  03/17/24 205 lb 14.6 oz (93.4 kg)      Physical Exam Constitutional:      General: She is not in acute distress.    Appearance: Normal appearance. She is well-developed. She is not ill-appearing or toxic-appearing.  HENT:     Head: Normocephalic.     Right Ear: Hearing, tympanic membrane, ear canal and external ear normal. Tympanic membrane is not erythematous, retracted or bulging.     Left Ear: Hearing, tympanic membrane, ear canal and external ear normal. Tympanic membrane is not erythematous, retracted or bulging.     Nose: No mucosal edema or rhinorrhea.     Right Sinus: No maxillary sinus tenderness or frontal sinus tenderness.     Left Sinus: No maxillary sinus tenderness or frontal sinus tenderness.     Mouth/Throat:     Pharynx: Uvula midline.  Eyes:     General: Lids are normal. Lids are everted, no foreign bodies appreciated.     Conjunctiva/sclera: Conjunctivae normal.     Pupils: Pupils are equal, round, and reactive to light.  Neck:     Thyroid : No thyroid  mass or thyromegaly.     Vascular: No carotid bruit.     Trachea: Trachea normal.  Cardiovascular:     Rate and Rhythm: Normal rate and regular rhythm.     Pulses: Normal pulses.      Heart sounds: Normal heart sounds, S1 normal and S2 normal. No murmur heard.    No friction rub. No gallop.  Pulmonary:     Effort: Pulmonary effort is normal. Tachypnea present. No respiratory distress.     Breath sounds: Wheezing and rhonchi present. No decreased breath sounds or rales.  Abdominal:     General: Bowel sounds are normal.     Palpations: Abdomen is soft.     Tenderness: There is no abdominal tenderness.  Musculoskeletal:     Cervical back: Normal  range of motion and neck supple.  Skin:    General: Skin is warm and dry.     Findings: No rash.  Neurological:     Mental Status: She is alert.  Psychiatric:        Mood and Affect: Mood is not anxious or depressed.        Speech: Speech normal.        Behavior: Behavior normal. Behavior is cooperative.        Thought Content: Thought content normal.        Judgment: Judgment normal.       Results for orders placed or performed in visit on 03/16/24  D-dimer, quantitative   Collection Time: 03/16/24 11:45 AM  Result Value Ref Range   D-Dimer, Quant 16.23 (H) <0.50 mcg/mL FEU  CBC with Differential/Platelet   Collection Time: 03/16/24 11:45 AM  Result Value Ref Range   WBC 9.9 3.8 - 10.8 Thousand/uL   RBC 4.51 3.80 - 5.10 Million/uL   Hemoglobin 12.9 11.7 - 15.5 g/dL   HCT 59.9 64.9 - 54.9 %   MCV 88.7 80.0 - 100.0 fL   MCH 28.6 27.0 - 33.0 pg   MCHC 32.3 32.0 - 36.0 g/dL   RDW 86.5 88.9 - 84.9 %   Platelets 209 140 - 400 Thousand/uL   MPV 10.8 7.5 - 12.5 fL   Neutro Abs 8,227 (H) 1,500 - 7,800 cells/uL   Absolute Lymphocytes 1,247 850 - 3,900 cells/uL   Absolute Monocytes 376 200 - 950 cells/uL   Eosinophils Absolute 40 15 - 500 cells/uL   Basophils Absolute 10 0 - 200 cells/uL   Neutrophils Relative % 83.1 %   Total Lymphocyte 12.6 %   Monocytes Relative 3.8 %   Eosinophils Relative 0.4 %   Basophils Relative 0.1 %  Basic Metabolic Panel   Collection Time: 03/16/24 11:45 AM  Result Value Ref Range    Glucose, Bld 126 (H) 65 - 99 mg/dL   BUN 14 7 - 25 mg/dL   Creat 9.22 9.39 - 9.04 mg/dL   eGFR 76 > OR = 60 fO/fpw/8.26f7   BUN/Creatinine Ratio SEE NOTE: 6 - 22 (calc)   Sodium 140 135 - 146 mmol/L   Potassium 4.9 3.5 - 5.3 mmol/L   Chloride 104 98 - 110 mmol/L   CO2 26 20 - 32 mmol/L   Calcium 9.6 8.6 - 10.4 mg/dL    Assessment and Plan  Community acquired pneumonia of right lower lobe of lung Assessment & Plan: Acute, currently on Augmentin  with minimal response.  I would like to broaden her antibiotics to levofloxacin  but she is hesitant given possible side effects and friends that have had issues with this medication.  Instead we will add doxycycline  100 mg p.o. twice daily x 10 days. Given patient age and continued issues struggling I would have a low threshold for hospital admission but patient would like to avoid this if possible. She is staying well-hydrated.  Return and ER precautions provided.   SOB (shortness of breath) Assessment & Plan: Acute, current shortness of breath with lack of improvement with antibiotics concerning for additional reason for breathing issues.  Minimal improvement with prednisone  but will have her continue.  Patient with history of pulmonary embolus initially on lifelong anticoagulation but having stopped this per recommendations several months ago. Will evaluate with lab work for secondary causes of shortness of breath including possible pulmonary embolus.  Orders: -     D-dimer, quantitative -  CBC with Differential/Platelet -     Basic metabolic panel with GFR  ILD (interstitial lung disease) (HCC) Assessment & Plan: Chronic, likely contributing to shortness of breath.     No follow-ups on file.   Greig Ring, MD

## 2024-03-16 NOTE — ED Provider Notes (Signed)
 [place] [facility] AT [facility] Provider Note   CSN: [id number] Arrival date & time: 03/16/24  1916     Patient presents with: Shortness of Breath   [name] is a 83 y.o. female.   Patient is a 83 year old female who presents with cough and shortness of breath.  She says her symptoms started about a week ago.  She has a cough which is productive of sputum.  She has had some increasing shortness of breath.  She has a history of interstitial lung disease from her rheumatoid arthritis.  She also has had a prior PE that was in association with breast cancer treatment a few years ago.  She saw her PCP on June 6 and had a chest x-ray which showed right middle lobe and lower lobe pneumonia.  She was started on Augmentin  and azithromycin .  She completed the azithromycin .  She was started on a prednisone  pack yesterday.  She went back to her PCP today because she was feeling more short of breath.  She had labs which showed an elevated D-dimer and was sent here for further evaluation.  She denies any known fevers.  She denies any increased leg swelling.  No associated chest pain.       Prior to Admission medications   Medication Sig Start Date End Date Taking? Authorizing Provider  acetaminophen  (TYLENOL ) 500 MG tablet Take 1,000 mg by mouth in the morning and at bedtime.    [provider]  albuterol  (VENTOLIN  HFA) 108 (90 Base) MCG/ACT inhaler Inhale 2 puffs into the lungs every 6 (six) hours as needed for wheezing or shortness of breath. 03/10/24   Bedsole, Amy E, MD  amoxicillin -clavulanate (AUGMENTIN ) 875-125 MG tablet Take 1 tablet by mouth 2 (two) times daily. 03/10/24   Bedsole, Amy E, MD  azithromycin  (ZITHROMAX ) 250 MG tablet 2 tab po x 1 day then 1 tab po daily 03/10/24   Bedsole, Amy E, MD  benzonatate  (TESSALON ) 200 MG capsule Take 1 capsule (200 mg total) by mouth 2 (two) times daily as needed for cough. 03/10/24   Bedsole, Amy E, MD  doxycycline   (VIBRA -TABS) 100 MG tablet Take 1 tablet (100 mg total) by mouth 2 (two) times daily. 03/16/24   Bedsole, Amy E, MD  folic acid  (FOLVITE ) 1 MG tablet Take 2 mg by mouth daily.    [provider]  hydroxychloroquine (PLAQUENIL) 200 MG tablet Take 200 mg by mouth 2 (two) times daily. 12/18/20   [provider]  losartan  (COZAAR ) 50 MG tablet Take 1 tablet by mouth once daily 01/24/24   Dorothe Gaster, NP  predniSONE  (DELTASONE ) 20 MG tablet 3 tabs by mouth daily x 3 days, then 2 tabs by mouth daily x 2 days then 1 tab by mouth daily x 2 days 03/14/24   Judithann Novas, MD  promethazine -dextromethorphan (PROMETHAZINE -DM) 6.25-15 MG/5ML syrup Take 5 mLs by mouth at bedtime as needed for cough. 03/14/24   Bedsole, Amy E, MD    Allergies: Codeine, Hydrocodone  bit-homatrop mbr, and Hydrocodone     Review of Systems  Constitutional:  Positive for fatigue. Negative for chills, diaphoresis and fever.  HENT:  Negative for congestion, rhinorrhea and sneezing.   Eyes: Negative.   Respiratory:  Positive for cough, shortness of breath and wheezing. Negative for chest tightness.   Cardiovascular:  Negative for chest pain and leg swelling.  Gastrointestinal:  Negative for abdominal pain, blood in stool, diarrhea, nausea and vomiting.  Genitourinary:  Negative for  difficulty urinating, flank pain, frequency and hematuria.  Musculoskeletal:  Negative for arthralgias and back pain.  Skin:  Negative for rash.  Neurological:  Negative for dizziness, speech difficulty, weakness, numbness and headaches.    Updated Vital Signs BP (!) 168/69   Pulse 83   Temp 98.3 F (36.8 C) (Oral)   Resp (!) 22   Ht 5' 2 (1.575 m)   Wt 93.6 kg   SpO2 92%   BMI 37.75 kg/m   Physical Exam Constitutional:      Appearance: She is well-developed.  HENT:     Head: Normocephalic and atraumatic.   Eyes:     Pupils: Pupils are equal, round, and reactive to light.    Cardiovascular:     Rate and Rhythm:  Normal rate and regular rhythm.     Heart sounds: Normal heart sounds.  Pulmonary:     Effort: Pulmonary effort is normal. No respiratory distress.     Breath sounds: Examination of the right-middle field reveals rales. Examination of the right-lower field reveals rales. Rales present. No wheezing.  Chest:     Chest wall: No tenderness.  Abdominal:     General: Bowel sounds are normal.     Palpations: Abdomen is soft.     Tenderness: There is no abdominal tenderness. There is no guarding or rebound.   Musculoskeletal:        General: Normal range of motion.     Cervical back: Normal range of motion and neck supple.     Comments: 1+ edema to lower extremities bilaterally  Lymphadenopathy:     Cervical: No cervical adenopathy.   Skin:    General: Skin is warm and dry.     Findings: No rash.   Neurological:     Mental Status: She is alert and oriented to person, place, and time.     (all labs ordered are listed, but only abnormal results are displayed) Labs Reviewed  CULTURE, BLOOD (ROUTINE X 2)  CULTURE, BLOOD (ROUTINE X 2)  LACTIC ACID, PLASMA  PRO BRAIN NATRIURETIC PEPTIDE  LACTIC ACID, PLASMA  HEPARIN  LEVEL (UNFRACTIONATED)    EKG: EKG Interpretation Date/Time:  Thursday March 16 2024 19:35:03 EDT Ventricular Rate:  88 PR Interval:  178 QRS Duration:  79 QT Interval:  384 QTC Calculation: 468 R Axis:   39  Text Interpretation: Sinus rhythm Borderline repolarization abnormality since last tracing no significant change Confirmed by Hershel Los [phone]) on 03/16/2024 8:05:32 PM  Radiology: CT Angio Chest PE W/Cm &/Or Wo Cm Result Date: 03/16/2024 CLINICAL DATA:  Positive D-dimer chest tightness EXAM: CT ANGIOGRAPHY CHEST WITH CONTRAST TECHNIQUE: Multidetector CT imaging of the chest was performed using the standard protocol during bolus administration of intravenous contrast. Multiplanar CT image reconstructions and MIPs were obtained to evaluate the vascular  anatomy. RADIATION DOSE REDUCTION: This exam was performed according to the departmental dose-optimization program which includes automated exposure control, adjustment of the mA and/or kV according to patient size and/or use of iterative reconstruction technique. CONTRAST:  80mL OMNIPAQUE  IOHEXOL  350 MG/ML SOLN COMPARISON:  Chest x-ray 03/10/2024, CT 11/25/2020 FINDINGS: Cardiovascular: Satisfactory opacification of the pulmonary arteries to the segmental level. Positive for right upper lobe are, segmental and sub segmental PE. Right lower lobe segmental and subsegmental PE. Small volume thrombus within the left lower lobe are pulmonary artery with thrombus in left lower lobe segmental and subsegmental vessels. Positive for left upper lobe segmental and subsegmental emboli. No evidence for right heart strain, RV LV  ratio is 0.72. Moderate aortic atherosclerosis. No aneurysm. Coronary vascular calcification. Normal cardiac size. No pericardial effusion Mediastinum/Nodes: Patent trachea. No thyroid  mass. No suspicious lymph nodes. Lungs/Pleura: No acute airspace disease, pleural effusion, or pneumothorax. Mild subpleural reticulation. Stable 5 mm left lower lobe pulmonary nodule on series 6, image 108. 3 mm right upper lobe pulmonary nodule on series 6, image 62, not definitively seen on prior. Upper Abdomen: No acute finding.  Gallstones Musculoskeletal: No acute osseous abnormality. Review of the MIP images confirms the above findings. IMPRESSION: 1. Positive for acute bilateral pulmonary emboli. No evidence for right heart strain. 2. No acute airspace disease. 3. 3 mm right upper lobe pulmonary nodule, not definitively seen on prior. No follow-up needed if patient is low-risk.This recommendation follows the consensus statement: Guidelines for Management of Incidental Pulmonary Nodules Detected on CT Images: From the Fleischner Society 2017; Radiology 2017; 284:228-243. Critical Value/emergent results were called  by telephone at the time of interpretation on 03/16/2024 at 8:46 pm to provider Braileigh Landenberger , who verbally acknowledged these results. Aortic Atherosclerosis (ICD10-I70.0). Electronically Signed   By: Esmeralda Hedge M.D.   On: 03/16/2024 20:46     Procedures   Medications Ordered in the ED  heparin  ADULT infusion 100 units/mL (25000 units/250mL) (1,150 Units/hr Intravenous New Bag/Given 03/16/24 2136)  iohexol  (OMNIPAQUE ) 350 MG/ML injection 80 mL (80 mLs Intravenous Contrast Given 03/16/24 2024)  heparin  bolus via infusion 4,000 Units (4,000 Units Intravenous Bolus from Bag 03/16/24 2126)    Clinical Course as of 03/16/24 2313  Thu Mar 16, 2024  2045 Per radiology, PE bilateral no right heart straight.  [JR]    Clinical Course User Index [JR] Janalee Mcmurray, PA-C                                 Medical Decision Making Amount and/or Complexity of Data Reviewed Labs: ordered. Radiology: ordered.  Risk Prescription drug management. Decision regarding hospitalization.  CRITICAL CARE Performed by: Hershel Los Total critical care time: 60 minutes Critical care time was exclusive of separately billable procedures and treating other patients. Critical care was necessary to treat or prevent imminent or life-threatening deterioration. Critical care was time spent personally by me on the following activities: development of treatment plan with patient and/or surrogate as well as nursing, discussions with consultants, evaluation of patient's response to treatment, examination of patient, obtaining history from patient or surrogate, ordering and performing treatments and interventions, ordering and review of laboratory studies, ordering and review of radiographic studies, pulse oximetry and re-evaluation of patient's condition.  This patient presents to the ED for concern of shortness of breath, this involves an extensive number of treatment options, and is a complaint that carries with it  a high risk of complications and morbidity.  I considered the following differential and admission for this acute, potentially life threatening condition.  The differential diagnosis includes pneumonia, PE, pneumothorax, sepsis, ACS, pulmonary edema  MDM:    Patient is 83 year old who was recently been treated for pneumonia with antibiotics.  She recently had an increase in her shortness of breath.  Her PCP drew labs which showed an elevated D-dimer.  I reviewed her outpatient labs.  CT scan was ordered which shows bilateral PEs.  Patient was started on heparin .  Discussed with Dr. [facility] Lavender who will admit the patient for further treatment.  (Labs, imaging, consults)  Labs: I Ordered, and personally interpreted labs.  The pertinent results include: Elevated D-dimer  Imaging Studies ordered: I ordered imaging studies including CT chest I independently visualized and interpreted imaging. I agree with the radiologist interpretation  Additional history obtained from chart.  External records from outside source obtained and reviewed including recent PCP notes  Cardiac Monitoring: The patient was maintained on a cardiac monitor.  If on the cardiac monitor, I personally viewed and interpreted the cardiac monitored which showed an underlying rhythm of: Sinus rhythm  Reevaluation: After the interventions noted above, I reevaluated the patient and found that they have :stayed the same  Social Determinants of Health:  none  Disposition: Admitted to hospital  Co morbidities that complicate the patient evaluation  Past Medical History:  Diagnosis Date   Ambulates with cane    Antiphospholipid antibody syndrome (HCC)    Breast cancer    right   Chest discomfort on left    when lies down, she has to sit up   chronic sob on occasion    Coronary artery calcification seen on CT scan 04/15/2023   Dysrhythmia    PSVT/ palpitations-  controlled with prn Inderal    History of pulmonary embolism     2020 or 2021   Hyperlipidemia    statin intolerant   Hypertension    Interstitial lung disease (HCC)    Left arm numbness    at rest and with activity   Migraines    Obesity    Osteoarthritis    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   Personal history of radiation therapy 2020   right breast   Phlebitis of leg, right, superficial 1986/1989   x 2   RA (rheumatoid arthritis) (HCC)    Urinary incontinence      Medicines Meds ordered this encounter  Medications   iohexol  (OMNIPAQUE ) 350 MG/ML injection 80 mL   heparin  bolus via infusion 4,000 Units   heparin  ADULT infusion 100 units/mL (25000 units/250mL)    I have reviewed the patients home medicines and have made adjustments as needed  Problem List / ED Course: Problem List Items Addressed This Visit       Cardiovascular and Mediastinum   * (Principal) Pulmonary embolism (HCC) - Primary   Relevant Medications   heparin  ADULT infusion 100 units/mL (25000 units/296mL)             Final diagnoses:  Multiple subsegmental pulmonary emboli without acute cor pulmonale [facility])    ED Discharge Orders     None          Hershel Los, MD 03/16/24 2315

## 2024-03-16 NOTE — ED Notes (Signed)
 Heparin  gtt started. Heparin  blood draw due @ 0600 tomorrow morning. Pt resting in bed, RR equal and unlabored. Dtr at bedside for comfort. No complaints at this time. Awaiting bed placement.

## 2024-03-16 NOTE — Telephone Encounter (Signed)
 CRITICAL VALUE STICKER  CRITICAL VALUE: D dimer 16.23  RECEIVER (on-site recipient of call): Arraya Buck  DATE & TIME NOTIFIED: 03/16/24 3:40  MESSENGER (representative from lab): Lidel  MD NOTIFIED: Cherlyn Cornet  TIME OF NOTIFICATION: 3:45    Results are in Epic

## 2024-03-16 NOTE — ED Triage Notes (Signed)
 Sob x a while Per daughter PNA. On  abt and prednisone  Seen at PCP  Labs done  + ddimer Hx PE Chest tightness

## 2024-03-16 NOTE — Telephone Encounter (Signed)
 Spoke with pt and daughter... pt to go to ER.

## 2024-03-17 ENCOUNTER — Telehealth (HOSPITAL_COMMUNITY): Payer: Self-pay | Admitting: Pharmacy Technician

## 2024-03-17 ENCOUNTER — Other Ambulatory Visit: Payer: Self-pay

## 2024-03-17 ENCOUNTER — Encounter (HOSPITAL_BASED_OUTPATIENT_CLINIC_OR_DEPARTMENT_OTHER): Payer: Self-pay | Admitting: Internal Medicine

## 2024-03-17 ENCOUNTER — Other Ambulatory Visit (HOSPITAL_COMMUNITY): Payer: Self-pay

## 2024-03-17 ENCOUNTER — Observation Stay (HOSPITAL_BASED_OUTPATIENT_CLINIC_OR_DEPARTMENT_OTHER)

## 2024-03-17 DIAGNOSIS — D72829 Elevated white blood cell count, unspecified: Secondary | ICD-10-CM | POA: Diagnosis not present

## 2024-03-17 DIAGNOSIS — I2699 Other pulmonary embolism without acute cor pulmonale: Secondary | ICD-10-CM

## 2024-03-17 DIAGNOSIS — E66813 Obesity, class 3: Secondary | ICD-10-CM | POA: Diagnosis not present

## 2024-03-17 DIAGNOSIS — Z79899 Other long term (current) drug therapy: Secondary | ICD-10-CM | POA: Diagnosis not present

## 2024-03-17 DIAGNOSIS — R0602 Shortness of breath: Secondary | ICD-10-CM | POA: Diagnosis present

## 2024-03-17 DIAGNOSIS — J849 Interstitial pulmonary disease, unspecified: Secondary | ICD-10-CM | POA: Diagnosis not present

## 2024-03-17 DIAGNOSIS — J9600 Acute respiratory failure, unspecified whether with hypoxia or hypercapnia: Secondary | ICD-10-CM | POA: Diagnosis not present

## 2024-03-17 DIAGNOSIS — I10 Essential (primary) hypertension: Secondary | ICD-10-CM | POA: Diagnosis not present

## 2024-03-17 DIAGNOSIS — E785 Hyperlipidemia, unspecified: Secondary | ICD-10-CM | POA: Diagnosis not present

## 2024-03-17 DIAGNOSIS — Z853 Personal history of malignant neoplasm of breast: Secondary | ICD-10-CM | POA: Diagnosis not present

## 2024-03-17 DIAGNOSIS — R7303 Prediabetes: Secondary | ICD-10-CM | POA: Diagnosis not present

## 2024-03-17 DIAGNOSIS — M069 Rheumatoid arthritis, unspecified: Secondary | ICD-10-CM | POA: Diagnosis not present

## 2024-03-17 DIAGNOSIS — R600 Localized edema: Secondary | ICD-10-CM | POA: Diagnosis present

## 2024-03-17 LAB — HEPARIN LEVEL (UNFRACTIONATED)
Heparin Unfractionated: 0.45 [IU]/mL (ref 0.30–0.70)
Heparin Unfractionated: 0.31 [IU]/mL (ref 0.30–0.70)

## 2024-03-17 LAB — LACTIC ACID, PLASMA: Lactic Acid, Venous: 1.3 mmol/L (ref 0.5–1.9)

## 2024-03-17 MED ORDER — ONDANSETRON HCL 4 MG/2ML IJ SOLN
INTRAMUSCULAR | Status: AC
  Filled 2024-03-17: qty 2

## 2024-03-17 MED ORDER — AMOXICILLIN-POT CLAVULANATE 875-125 MG PO TABS
1.0000 | ORAL_TABLET | Freq: Two times a day (BID) | ORAL | Status: DC
  Administered 2024-03-17 (×2): 1 via ORAL
  Filled 2024-03-17 (×3): qty 1

## 2024-03-17 MED ORDER — GUAIFENESIN ER 600 MG PO TB12
600.0000 mg | ORAL_TABLET | Freq: Two times a day (BID) | ORAL | Status: DC
  Administered 2024-03-17 (×2): 600 mg via ORAL
  Filled 2024-03-17 (×3): qty 1

## 2024-03-17 MED ORDER — ONDANSETRON HCL 4 MG PO TABS: 4.0000 mg | ORAL_TABLET | Freq: Four times a day (QID) | ORAL | Status: DC | PRN

## 2024-03-17 MED ORDER — ALBUTEROL SULFATE (2.5 MG/3ML) 0.083% IN NEBU
2.5000 mg | INHALATION_SOLUTION | RESPIRATORY_TRACT | Status: DC | PRN
  Filled 2024-03-17: qty 3

## 2024-03-17 MED ORDER — PREDNISONE 20 MG PO TABS: 40.0000 mg | ORAL_TABLET | Freq: Every day | ORAL | Status: DC

## 2024-03-17 MED ORDER — PREDNISONE 20 MG PO TABS: 20.0000 mg | ORAL_TABLET | Freq: Every day | ORAL | Status: DC

## 2024-03-17 MED ORDER — ALBUTEROL SULFATE HFA 108 (90 BASE) MCG/ACT IN AERS
2.0000 | INHALATION_SPRAY | Freq: Four times a day (QID) | RESPIRATORY_TRACT | Status: DC | PRN
  Filled 2024-03-17: qty 6.7

## 2024-03-17 MED ORDER — IPRATROPIUM-ALBUTEROL 0.5-2.5 (3) MG/3ML IN SOLN: 3.0000 mL | Freq: Once | RESPIRATORY_TRACT | Status: AC

## 2024-03-17 MED ORDER — BENZONATATE 100 MG PO CAPS
200.0000 mg | ORAL_CAPSULE | Freq: Two times a day (BID) | ORAL | Status: DC | PRN
  Filled 2024-03-17: qty 2

## 2024-03-17 MED ORDER — IPRATROPIUM-ALBUTEROL 0.5-2.5 (3) MG/3ML IN SOLN
RESPIRATORY_TRACT | Status: AC
  Administered 2024-03-17: 3 mL via RESPIRATORY_TRACT
  Filled 2024-03-17: qty 3

## 2024-03-17 MED ORDER — LOSARTAN POTASSIUM 50 MG PO TABS
50.0000 mg | ORAL_TABLET | Freq: Every day | ORAL | Status: DC
  Filled 2024-03-17 (×2): qty 1

## 2024-03-17 MED ORDER — FOLIC ACID 1 MG PO TABS
2.0000 mg | ORAL_TABLET | Freq: Every day | ORAL | Status: DC
  Filled 2024-03-17 (×2): qty 2

## 2024-03-17 MED ORDER — ACETAMINOPHEN 325 MG PO TABS: 650.0000 mg | ORAL_TABLET | Freq: Four times a day (QID) | ORAL | Status: DC | PRN

## 2024-03-17 MED ORDER — HYDROXYCHLOROQUINE SULFATE 200 MG PO TABS
200.0000 mg | ORAL_TABLET | Freq: Two times a day (BID) | ORAL | Status: DC
  Administered 2024-03-17: 200 mg via ORAL
  Filled 2024-03-17 (×4): qty 1

## 2024-03-17 MED ORDER — ONDANSETRON HCL 4 MG/2ML IJ SOLN
4.0000 mg | Freq: Four times a day (QID) | INTRAMUSCULAR | Status: DC | PRN
Start: 2024-03-17 — End: 2024-03-18

## 2024-03-17 MED ORDER — ONDANSETRON HCL 4 MG/2ML IJ SOLN: 4.0000 mg | Freq: Once | INTRAMUSCULAR | Status: AC

## 2024-03-17 MED ORDER — ACETAMINOPHEN 650 MG RE SUPP: 650.0000 mg | Freq: Four times a day (QID) | RECTAL | Status: DC | PRN

## 2024-03-17 MED ORDER — PREDNISONE 20 MG PO TABS
40.0000 mg | ORAL_TABLET | Freq: Every day | ORAL | Status: AC
  Filled 2024-03-17 (×2): qty 2

## 2024-03-17 NOTE — ED Notes (Signed)
 Patient became nauseated while being moved to EMS stretcher, verbal order received for zofran  4mg  IV from provider.  Administered as ordered.

## 2024-03-17 NOTE — H&P (Signed)
 History and Physical    Patient: [name] XBJ:[id number] DOB: November 10, 1940 DOA: 03/16/2024 DOS: the patient was seen and examined on 03/17/2024 PCP: Judithann Novas, MD  Patient coming from: Home  Chief Complaint:  Chief Complaint  Patient presents with   Shortness of Breath   HPI: [name] is a 83 y.o. female with medical history significant of antiphospholipid antibody syndrome, rheumatoid arthritis, history of PE, interstitial lung disease, coronary calcification, palpitations/PSVT, hyperlipidemia intolerant to statins, hypertension, migraine headaches, class II obesity, osteoarthritis, breast cancer, Paget disease of the breast, history of radiation therapy, history of superficial phlebitis, history of pulmonary embolism who recently was treated for postviral pneumonia and was referred by her provider to the emergency department due to worsening dyspnea associated with chest tightness with a positive D-dimer. No fever, chills, hemoptysis, positive for cough, wheezing. No  palpitations, diaphoresis, PND, orthopnea or pitting edema of the lower extremities. No abdominal pain, nausea, emesis, diarrhea, constipation, melena or hematochezia. No flank pain, dysuria, frequency or hematuria. No polyuria, polydipsia, polyphagia or blurred vision.  She was taken off DOAC last year.  Lab work: CBC was unremarkable.  D-dimer was 16.23.  BMP showed a glucose of 126 mg/dL, but was otherwise within expected range.  Lactic acid x 2 was normal.  Imaging: CTA chest was positive for acute bilateral pulmonary emboli without evidence of right heart strain.  No acute airspace disease.  There is a 3 mm right upper lobe pulmonary nodule, not definitely seen on prior imaging.  No follow-up needed if the patient is low risk.  ED course: Initial vital signs were temperature 98.3 F, pulse 94, respiration 11, BP 143/83 mmHg O2 sat 92% on room air.  The patient was placed on heparin  infusion.  She also  received ondansetron  4 mg IVP and a DuoNeb.   Review of Systems: As mentioned in the history of present illness. All other systems reviewed and are negative.  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   Personal history of radiation therapy 2020   right breast   Phlebitis of leg, right, superficial 1986/1989   x 2   RA (rheumatoid arthritis) (HCC)    Urinary incontinence    Past Surgical History:  Procedure Laterality Date   BOTOX  INJECTION N/A 04/20/2023   Procedure: BOTOX  INJECTION 100 UNITS;  Surgeon: Roxane Copp, MD;  Location: [facility] [place];  Service: Urology;  Laterality: N/A;   BREAST BIOPSY Left 07/05/2019   BREAST BIOPSY Bilateral 08/24/2019   BREAST EXCISIONAL BIOPSY Left 09/20/2019   BREAST LUMPECTOMY Right 09/20/2019   BREAST LUMPECTOMY WITH RADIOACTIVE SEED LOCALIZATION Bilateral 09/20/2019   Procedure: BILATERAL BREAST LUMPECTOMIES WITH BILATERAL RADIOACTIVE SEED LOCALIZATION AND RIGHT NIPPLE BIOPSY;  Surgeon: Sim Dryer, MD;  Location: [facility];  Service: General;  Laterality: Bilateral;   CARDIAC CATHETERIZATION  1990s   negative   CARDIOVASCULAR STRESS TEST  2008   NML   CARDIOVASCULAR STRESS TEST  02/02/2007   EF 70%, NO ISCHEMIA   CARPAL TUNNEL RELEASE Bilateral    CARPAL TUNNEL RELEASE Bilateral    CYSTOSCOPY WITH INJECTION N/A 04/20/2023    Procedure: CYSTOSCOPY WITH INJECTION OF [place];  Surgeon: Roxane Copp, MD;  Location: [facility] [place];  Service: Urology;  Laterality: N/A;  45 MINS   DILATION AND CURETTAGE, DIAGNOSTIC / THERAPEUTIC  2008   LESION DESTRUCTION  10/2017   Face; Dr. Rochelle Chu   ROTATOR CUFF REPAIR  1999   Right, left in 2002   TOTAL KNEE ARTHROPLASTY  2008   Left   TOTAL KNEE ARTHROPLASTY  10/11/2012   Procedure: TOTAL KNEE ARTHROPLASTY;  Surgeon: Bevin Bucks, MD;  Location: [facility];  Service: Orthopedics;  Laterality: Right;   US  ECHOCARDIOGRAPHY  02/07/2007   EF 55-60%   Social History:  reports that she quit smoking about 60 years ago. Her smoking use included cigarettes. She started smoking about 71 years ago. She has a 5.5 pack-year smoking history. She has never used smokeless tobacco. She reports that she does not drink alcohol and does not use drugs.  Allergies  Allergen Reactions   Codeine Nausea And Vomiting   Hydrocodone  Other (See Comments)    Hallucinations    Family History  Problem Relation Age of Onset   Hypertension Mother    Heart attack Father 73   Hypertension Father    Peptic Ulcer Disease Father    Heart defect Sister    Lung cancer Brother    Coronary artery disease Other        Female 1st degree relative <50   Colon cancer Neg Hx    Prostate cancer Neg Hx    Ovarian cancer Neg Hx    Pancreatic cancer Neg Hx    Endometrial cancer Neg Hx    Breast cancer Neg Hx    BRCA 1/2 Neg Hx     Prior to Admission medications   Medication Sig Start Date End Date Taking? Authorizing Provider  acetaminophen  (TYLENOL ) 500 MG tablet Take 500 mg by mouth every 4 (four) hours as needed for mild pain (pain score 1-3) or moderate pain (pain score 4-6).   Yes [provider]  albuterol  (VENTOLIN  HFA) 108 (90 Base) MCG/ACT inhaler Inhale 2 puffs into the lungs every 6 (six) hours as needed for wheezing or shortness of breath. 03/10/24  Yes Bedsole, Amy E, MD   amoxicillin -clavulanate (AUGMENTIN ) 875-125 MG tablet Take 1 tablet by mouth 2 (two) times daily. Patient taking differently: Take 1 tablet by mouth 2 (two) times daily. 03/10/24  Yes Bedsole, Amy E, MD  benzonatate  (TESSALON ) 200 MG capsule Take 1 capsule (200 mg total) by mouth 2 (two) times daily as needed for cough. 03/10/24  Yes Bedsole, Amy E, MD  folic acid  (FOLVITE ) 1 MG tablet Take 2 mg by mouth daily.   Yes [provider]  hydroxychloroquine (PLAQUENIL) 200 MG tablet Take 200 mg by mouth 2 (two) times daily. 12/18/20  Yes [provider]  losartan  (COZAAR ) 50 MG tablet Take 1 tablet by mouth once daily 01/24/24  Yes Dorothe Gaster, NP  predniSONE  (DELTASONE ) 20 MG tablet 3 tabs by mouth daily x 3 days, then 2 tabs by mouth daily x  2 days then 1 tab by mouth daily x 2 days Patient taking differently: Take 20-60 mg by mouth See admin instructions. Take three tablets by mouth for three days. Take two tablets by mouth for two days. Take one tablet by mouth for two days. 03/14/24  Yes Bedsole, Amy E, MD  azithromycin  (ZITHROMAX ) 250 MG tablet 2 tab po x 1 day then 1 tab po daily Patient not taking: Reported on 03/17/2024 03/10/24   Judithann Novas, MD  doxycycline  (VIBRA -TABS) 100 MG tablet Take 1 tablet (100 mg total) by mouth 2 (two) times daily. Patient not taking: Reported on 03/17/2024 03/16/24   Judithann Novas, MD  promethazine -dextromethorphan (PROMETHAZINE -DM) 6.25-15 MG/5ML syrup Take 5 mLs by mouth at bedtime as needed for cough. Patient not taking: Reported on 03/17/2024 03/14/24   Judithann Novas, MD    Physical Exam: Vitals:   03/17/24 1045 03/17/24 1100 03/17/24 1208 03/17/24 1214  BP: (!) 174/77 (!) 162/73 (!) 146/76   Pulse: 89 73 71   Resp: 19 (!) 23 19   Temp:   98.6 F (37 C)   TempSrc:   Oral   SpO2: 95% 97% 95%   Weight:    93.4 kg  Height:    5' 3 (1.6 m)   Physical Exam Vitals and nursing note reviewed.  Constitutional:      General: She is awake. She  is not in acute distress.    Appearance: She is well-developed. She is ill-appearing.     Interventions: Nasal cannula in place.  HENT:     Head: Normocephalic.     Nose: No rhinorrhea.     Mouth/Throat:     Mouth: Mucous membranes are dry.   Eyes:     General: No scleral icterus.    Pupils: Pupils are equal, round, and reactive to light.   Neck:     Vascular: No JVD.   Cardiovascular:     Rate and Rhythm: Normal rate and regular rhythm.     Heart sounds: S1 normal and S2 normal.  Pulmonary:     Effort: No tachypnea.     Breath sounds: Wheezing and rhonchi present. No decreased breath sounds or rales.  Abdominal:     General: There is no distension.     Palpations: Abdomen is soft.     Tenderness: There is no abdominal tenderness. There is no right CVA tenderness or left CVA tenderness.   Musculoskeletal:     Cervical back: Neck supple.     Right lower leg: No edema.     Left lower leg: No edema.   Skin:    General: Skin is warm and dry.   Neurological:     General: No focal deficit present.     Mental Status: She is alert and oriented to person, place, and time.   Psychiatric:        Mood and Affect: Mood normal.        Behavior: Behavior normal. Behavior is cooperative.     Data Reviewed:  Results are pending, will review when available.  EKG: Vent. rate 88 BPM PR interval 178 ms QRS duration 79 ms QT/QTcB 384/468 ms P-R-T axes -10 39 67 Sinus rhythm Borderline repolarization abnormality  Assessment and Plan: Principal Problem:   Pulmonary embolism (HCC) Observation/telemetry. Continue supplemental oxygen. Continue heparin  infusion. Check echocardiogram. Check lower extremity Doppler. Will need to resume anticoagulation permanently.  Active Problems:   ILD (interstitial lung disease) [facility]) Patient and daughter stated that  treatment is making her jittery. -Will decrease prednisone  to 40 mg a day. -Will use albuterol  MDI instead of  neb. Continue Augmentin  twice daily. Begin guaifenesin 600 mg p.o. twice daily.    Hyperlipidemia Intolerant to statins. Follow-up with primary care provider.    Essential hypertension, benign Continue losartan  50 mg p.o. daily.    Prediabetes Will follow-up fasting blood glucose.    Rheumatoid arthritis (HCC) Continue hydroxychloroquine. Follow-up rheumatology as an outpatient.     Advance Care Planning:   Code Status: Full Code   Consults:  Family Communication:   Severity of Illness: The appropriate patient status for this patient is OBSERVATION. Observation status is judged to be reasonable and necessary in order to provide the required intensity of service to ensure the patient's safety. The patient's presenting symptoms, physical exam findings, and initial radiographic and laboratory data in the context of their medical condition is felt to place them at decreased risk for further clinical deterioration. Furthermore, it is anticipated that the patient will be medically stable for discharge from the hospital within 2 midnights of admission.   Author: Danice Dural, MD 03/17/2024 2:15 PM  For on call review [email or web address].   This document was prepared using Dragon voice recognition software and may contain some unintended transcription errors.

## 2024-03-17 NOTE — Progress Notes (Signed)
 PHARMACY - ANTICOAGULATION CONSULT NOTE  Pharmacy Consult for heparin  Indication: pulmonary embolus  Allergies  Allergen Reactions   Codeine Nausea And Vomiting   Hydrocodone  Other (See Comments)    Hallucinations    Patient Measurements: Height: 5' 3 (160 cm) Weight: 93.4 kg (205 lb 14.6 oz) IBW/kg (Calculated) : 52.4 HEPARIN  DW (KG): 73.9Heparin Dosing wt: 71.91kg HEPARIN  DW (KG): 73.9   Vital Signs: Temp: 98.6 F (37 C) (06/13 1208) Temp Source: Oral (06/13 1208) BP: 146/76 (06/13 1208) Pulse Rate: 71 (06/13 1208)  Labs: Recent Labs    03/16/24 1145 03/17/24 0652 03/17/24 1520  HGB 12.9  --   --   HCT 40.0  --   --   PLT 209  --   --   HEPARINUNFRC  --  0.45 0.31  CREATININE 0.77  --   --     Estimated Creatinine Clearance: 56.9 mL/min (by C-G formula based on SCr of 0.77 mg/dL).   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 Pertinent PMH of antiphospholipid syndrome. Was previously on xarelto  for hx PE (2021-?09/2023). Also appears to have been on apixaban at some period? Not on PTA anticoagulation at this time. Per RN report from  patient, she has not been on [facility] for the past 7 months. Pharmacy has been consulted for heparin  dosing.   Heparin  level 0.31 on lower end of therapeutic range  No overt s/sx bleeding or issues with infusion noted.  Goal of Therapy:  Heparin  level 0.3-0.7 units/ml Monitor platelets by anticoagulation protocol: Yes   Plan:  Increase heparin  infusion to 1250 units/hr to remain therapeutic given PE Daily heparin  level, CBC, and monitoring for bleeding F/u plans for anticoagulation   Thank you for allowing pharmacy to participate in this patient's care.  Cecillia Cogan, PharmD Clinical Pharmacist 03/17/2024  3:51 PM

## 2024-03-17 NOTE — ED Notes (Signed)
 RT Note: Checked on patient to see if [name] needs a breathing treatment at this time. [name] stated [name] didn't need or want a breathing treatment at this time

## 2024-03-17 NOTE — ED Notes (Signed)
 Called Taryn at [facility] for transport

## 2024-03-17 NOTE — ED Notes (Signed)
 RT Note: Patient was assessed and asked if she wanted a breathing treatment. She stated no not at this time due to feeling shaky from the last treatment she had. She was placed on 2lpm [place] due to a low oxygen saturation of 88% on room air. She is currently on 2lpm [place] with an oxygen saturation 93%

## 2024-03-17 NOTE — Progress Notes (Signed)
 VASCULAR LAB    Bilateral lower extremity venous duplex has been performed.  See CV proc for preliminary results.   Keir Viernes, RVT 03/17/2024, 5:27 PM

## 2024-03-17 NOTE — ED Notes (Signed)
 Pt states she is feeling much better after breathing tx. Reports SOB improved. Repositioned pt back in bed, provided with warm blankets, pt A/Ox4,RR equal and unlabored. Heparin  gtt infusing to R20GAC. Dtr at bedside.awaiting room placement.

## 2024-03-17 NOTE — Progress Notes (Signed)
 PHARMACY - ANTICOAGULATION CONSULT NOTE  Pharmacy Consult for heparin  Indication: pulmonary embolus  Allergies  Allergen Reactions   Codeine Nausea And Vomiting   Hydrocodone  Bit-Homatrop Mbr Other (See Comments)    Hallucinations    Hydrocodone  Rash    Hallucinations    Patient Measurements: Height: 5' 2 (157.5 cm) Weight: 93.6 kg (206 lb 6.4 oz) IBW/kg (Calculated) : 50.1 HEPARIN  DW (KG): 71.9Heparin Dosing wt: 71.91kg HEPARIN  DW (KG): 71.9   Vital Signs: Temp: 98.2 F (36.8 C) (06/13 0531) Temp Source: Oral (06/13 0531) BP: 168/77 (06/13 0600) Pulse Rate: 70 (06/13 0600)  Labs: Recent Labs    03/16/24 1145 03/17/24 0652  HGB 12.9  --   HCT 40.0  --   PLT 209  --   HEPARINUNFRC  --  0.45  CREATININE 0.77  --     Estimated Creatinine Clearance: 55.8 mL/min (by C-G formula based on SCr of 0.77 mg/dL).   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 Pertinent PMH of antiphospholipid syndrome. Was previously on xarelto  for hx PE (2021-?09/2023). Also appears to have been on apixaban at some period? Not on PTA anticoagulation at this time.  Per RN report from patient, she has not been on [facility] for the past 7 months. Pharmacy has been consulted for heparin  dosing.   Heparin  level 0.45, therapeutic  No overt s/sx bleeding or issues with infusion noted.  Goal of Therapy:  Heparin  level 0.3-0.7 units/ml Monitor platelets by anticoagulation protocol: Yes   Plan:  Continue heparin  infusion at 1150 unit/hr  8h heparin  level  Daily heparin  level, CBC, and monitoring for bleeding F/u plans for anticoagulation   Thank you for allowing pharmacy to participate in this patient's care.  Fonda Hymen, PharmD Emergency Medicine Clinical Pharmacist 03/17/2024,8:02 AM

## 2024-03-17 NOTE — ED Notes (Signed)
 RT at bedside for breathing tx.

## 2024-03-17 NOTE — ED Notes (Signed)
 Pt woke up from sleep with increased coughing and SOB. RN at bedside, pt presents restless, dyspneic, requesting to sit up, repositioned pt on side of bed, pt reports SOB improving. RT and EDMD made aware of episode. VS WNL at this time.

## 2024-03-17 NOTE — Telephone Encounter (Signed)
 Patient Product/process development scientist completed.    The patient is insured through [facility]. Patient has Medicare and is not eligible for a copay card, but may be able to apply for patient assistance or Medicare RX Payment Plan (Patient Must reach out to their plan, if eligible for payment plan), if available.    Ran test claim for Eliquis 5 mg and the current 30 day co-pay is $336.89 due to a deductible.  Ran test claim for Xarelto  20 mg and the current 30 day co-pay is $336.89 due to a deductible.  This test claim was processed through [facility]- copay amounts may vary at other pharmacies due to pharmacy/plan contracts, or as the patient moves through the different stages of their insurance plan.     Morgan Arab, CPHT Pharmacy Technician III Certified Patient Advocate [facility] Patient Advocate Team Direct Number: [phone]  Fax: [phone]

## 2024-03-18 ENCOUNTER — Other Ambulatory Visit (HOSPITAL_COMMUNITY): Payer: Self-pay

## 2024-03-18 ENCOUNTER — Observation Stay (HOSPITAL_BASED_OUTPATIENT_CLINIC_OR_DEPARTMENT_OTHER)

## 2024-03-18 ENCOUNTER — Observation Stay (HOSPITAL_COMMUNITY)

## 2024-03-18 ENCOUNTER — Telehealth: Payer: Self-pay | Admitting: Pulmonary Disease

## 2024-03-18 DIAGNOSIS — J849 Interstitial pulmonary disease, unspecified: Secondary | ICD-10-CM

## 2024-03-18 DIAGNOSIS — R7303 Prediabetes: Secondary | ICD-10-CM

## 2024-03-18 DIAGNOSIS — I10 Essential (primary) hypertension: Secondary | ICD-10-CM

## 2024-03-18 DIAGNOSIS — E7800 Pure hypercholesterolemia, unspecified: Secondary | ICD-10-CM | POA: Diagnosis not present

## 2024-03-18 DIAGNOSIS — I2699 Other pulmonary embolism without acute cor pulmonale: Secondary | ICD-10-CM | POA: Diagnosis not present

## 2024-03-18 DIAGNOSIS — M069 Rheumatoid arthritis, unspecified: Secondary | ICD-10-CM

## 2024-03-18 DIAGNOSIS — R0602 Shortness of breath: Secondary | ICD-10-CM

## 2024-03-18 DIAGNOSIS — J9601 Acute respiratory failure with hypoxia: Secondary | ICD-10-CM

## 2024-03-18 LAB — COMPREHENSIVE METABOLIC PANEL WITH GFR
ALT: 21 U/L (ref 0–44)
ALT: 23 U/L (ref 0–44)
AST: 20 U/L (ref 15–41)
AST: 27 U/L (ref 15–41)
Albumin: 3.1 g/dL — ABNORMAL LOW (ref 3.5–5.0)
Albumin: 3.2 g/dL — ABNORMAL LOW (ref 3.5–5.0)
Alkaline Phosphatase: 76 U/L (ref 38–126)
Alkaline Phosphatase: 87 U/L (ref 38–126)
Anion gap: 10 (ref 5–15)
Anion gap: 9 (ref 5–15)
BUN: 14 mg/dL (ref 8–23)
BUN: 14 mg/dL (ref 8–23)
CO2: 24 mmol/L (ref 22–32)
CO2: 26 mmol/L (ref 22–32)
Calcium: 8.5 mg/dL — ABNORMAL LOW (ref 8.9–10.3)
Calcium: 8.6 mg/dL — ABNORMAL LOW (ref 8.9–10.3)
Chloride: 101 mmol/L (ref 98–111)
Chloride: 105 mmol/L (ref 98–111)
Creatinine, Ser: 0.61 mg/dL (ref 0.44–1.00)
Creatinine, Ser: 0.64 mg/dL (ref 0.44–1.00)
GFR, Estimated: >60 mL/min (ref >60)
GFR, Estimated: >60 mL/min (ref >60)
Glucose, Bld: 159 mg/dL — ABNORMAL HIGH (ref 70–99)
Glucose, Bld: 99 mg/dL (ref 70–99)
Potassium: 4.4 mmol/L (ref 3.5–5.1)
Potassium: 4.4 mmol/L (ref 3.5–5.1)
Sodium: 135 mmol/L (ref 135–145)
Sodium: 140 mmol/L (ref 135–145)
Total Bilirubin: 0.4 mg/dL (ref 0.0–1.2)
Total Bilirubin: 0.7 mg/dL (ref 0.0–1.2)
Total Protein: 6.7 g/dL (ref 6.5–8.1)
Total Protein: 7 g/dL (ref 6.5–8.1)

## 2024-03-18 LAB — PHOSPHORUS: Phosphorus: 4.1 mg/dL (ref 2.5–4.6)

## 2024-03-18 LAB — CBC
HCT: 36.3 % (ref 36.0–46.0)
Hemoglobin: 11.5 g/dL — ABNORMAL LOW (ref 12.0–15.0)
MCH: 29.1 pg (ref 26.0–34.0)
MCHC: 31.7 g/dL (ref 30.0–36.0)
MCV: 91.9 fL (ref 80.0–100.0)
Platelets: 215 10*3/uL (ref 150–400)
RBC: 3.95 MIL/uL (ref 3.87–5.11)
RDW: 14.4 % (ref 11.5–15.5)
WBC: 9 10*3/uL (ref 4.0–10.5)
nRBC: 0 % (ref 0.0–0.2)

## 2024-03-18 LAB — CBC WITH DIFFERENTIAL/PLATELET
Abs Immature Granulocytes: 0.05 10*3/uL (ref 0.00–0.07)
Basophils Absolute: 0 10*3/uL (ref 0.0–0.1)
Basophils Relative: 0 %
Eosinophils Absolute: 0 10*3/uL (ref 0.0–0.5)
Eosinophils Relative: 0 %
HCT: 39.8 % (ref 36.0–46.0)
Hemoglobin: 12.1 g/dL (ref 12.0–15.0)
Immature Granulocytes: 0 %
Lymphocytes Relative: 18 %
Lymphs Abs: 2.2 10*3/uL (ref 0.7–4.0)
MCH: 28.7 pg (ref 26.0–34.0)
MCHC: 30.4 g/dL (ref 30.0–36.0)
MCV: 94.5 fL (ref 80.0–100.0)
Monocytes Absolute: 0.7 10*3/uL (ref 0.1–1.0)
Monocytes Relative: 6 %
Neutro Abs: 8.9 10*3/uL — ABNORMAL HIGH (ref 1.7–7.7)
Neutrophils Relative %: 76 %
Platelets: 214 10*3/uL (ref 150–400)
RBC: 4.21 MIL/uL (ref 3.87–5.11)
RDW: 14.2 % (ref 11.5–15.5)
WBC: 11.8 10*3/uL — ABNORMAL HIGH (ref 4.0–10.5)
nRBC: 0 % (ref 0.0–0.2)

## 2024-03-18 LAB — ECHOCARDIOGRAM COMPLETE
AR max vel: 2.04 cm2
AV Area VTI: 2.01 cm2
AV Area mean vel: 1.9 cm2
AV Mean grad: 5 mmHg
AV Peak grad: 9.5 mmHg
Ao pk vel: 1.54 m/s
Area-P 1/2: 3.85 cm2
Calc EF: 49.4 %
Height: 63 in
S' Lateral: 3.8 cm
Single Plane A2C EF: 53.2 %
Single Plane A4C EF: 54 %
Weight: 3294.55 [oz_av]

## 2024-03-18 LAB — HEPARIN LEVEL (UNFRACTIONATED): Heparin Unfractionated: 0.5 [IU]/mL (ref 0.30–0.70)

## 2024-03-18 LAB — MAGNESIUM: Magnesium: 2.2 mg/dL (ref 1.7–2.4)

## 2024-03-18 MED ORDER — APIXABAN 5 MG PO TABS: 5.0000 mg | ORAL_TABLET | Freq: Two times a day (BID) | ORAL | Status: DC

## 2024-03-18 MED ORDER — ONDANSETRON HCL 4 MG PO TABS
4.0000 mg | ORAL_TABLET | Freq: Four times a day (QID) | ORAL | 0 refills | Status: DC | PRN
  Filled 2024-03-18: qty 20, 5d supply, fill #0

## 2024-03-18 MED ORDER — GUAIFENESIN ER 600 MG PO TB12
600.0000 mg | ORAL_TABLET | Freq: Two times a day (BID) | ORAL | 0 refills | Status: AC
  Filled 2024-03-18: qty 10, 5d supply, fill #0

## 2024-03-18 MED ORDER — PERFLUTREN LIPID MICROSPHERE: 1.0000 mL | INTRAVENOUS | Status: AC | PRN

## 2024-03-18 MED ORDER — APIXABAN 5 MG PO TABS
10.0000 mg | ORAL_TABLET | Freq: Two times a day (BID) | ORAL | Status: DC
  Filled 2024-03-18: qty 2

## 2024-03-18 MED ORDER — APIXABAN (ELIQUIS) VTE STARTER PACK (10MG AND 5MG)
ORAL_TABLET | ORAL | 0 refills | Status: DC
  Filled 2024-03-18: qty 74, 30d supply, fill #0

## 2024-03-18 NOTE — TOC Transition Note (Signed)
 Transition of Care [facility]) - Discharge Note   Patient Details  Name: [name] MRN: [id number] Date of Birth: 1941-06-18  [facility] CM/SW Contact:  Levie Ream, RN Phone Number: 03/18/2024, 2:49 PM   Clinical Narrative:    D/C orders received; travel oxygen tank to be delivered by [employer]; no TOC needs.   Final next level of care: Home/Self Care Barriers to Discharge: No Barriers Identified   Patient Goals and CMS Choice Patient states their goals for this hospitalization and ongoing recovery are:: home CMS Medicare.gov Compare Post Acute Care list provided to:: Patient   [place] ownership interest in [facility].provided to:: Patient    Discharge Placement                       Discharge Plan and Services Additional resources added to the After Visit Summary for     Discharge Planning Services: CM Consult            DME Arranged: Oxygen DME Agency: [employer] Date DME Agency Contacted: 03/18/24 Time DME Agency Contacted: 27 Representative spoke with at DME Agency: [facility]            Social Drivers of Health (SDOH) Interventions SDOH Screenings   Food Insecurity: No Food Insecurity (03/18/2024)  Housing: Low Risk  (03/18/2024)  Transportation Needs: No Transportation Needs (03/18/2024)  Utilities: Not At Risk (03/18/2024)  Alcohol Screen: Low Risk  (05/10/2020)  Depression (PHQ2-9): Medium Risk (11/08/2023)  Financial Resource Strain: Low Risk  (05/14/2023)   Received from [facility]  Physical Activity: Sufficiently Active (02/25/2022)  Social Connections: Socially Integrated (03/17/2024)  Stress: No Stress Concern Present (02/25/2022)  Tobacco Use: Medium Risk (03/17/2024)  Health Literacy: Low Risk  (06/11/2023)   Received from [facility]     Readmission Risk Interventions     No data to display

## 2024-03-18 NOTE — Progress Notes (Signed)
 Pt resting, dtg at bedside, no acute distress. Assessment unchanged. SRP, RN

## 2024-03-18 NOTE — TOC Initial Note (Signed)
 Transition of Care [facility]) - Initial/Assessment Note    Patient Details  Name: [name] MRN: [id number] Date of Birth: 1941/09/10  Transition [facility] Care [facility]) CM/SW Contact:    Levie Ream, RN Phone Number: 03/18/2024, 12:50 PM  Clinical Narrative:                 Eldora Greet w/ pt and dtr Ace Holder [phone]) in room; pt says she lives at home, and she plans to return at d/c; her dtr will provide transportation; pt verified insurance/PCP; she denied SDOH risks; pt says she has cane, walker, BSC, shower chair, and chair lift; pt also says she previously had home oxygen w/ [employer], and HHPT w/ [facility]; she would like to use these agencies if recc to receive services; awaiting PT/OT evals, and amb sat note; TOC is following.  Expected Discharge Plan: Home/Self Care Barriers to Discharge: Continued Medical Work up   Patient Goals and CMS Choice Patient states their goals for this hospitalization and ongoing recovery are:: home CMS Medicare.gov Compare Post Acute Care list provided to:: Patient   [place] ownership interest in [facility].provided to:: Patient    Expected Discharge Plan and Services   Discharge Planning Services: CM Consult   Living arrangements for the past 2 months: Single Family Home                                      Prior Living Arrangements/Services Living arrangements for the past 2 months: Single Family Home Lives with:: Self Patient language and need for interpreter reviewed:: Yes Do you feel safe going back to the place where you live?: Yes      Need for Family Participation in Patient Care: Yes (Comment) Care giver support system in place?: Yes (comment) Current home services: DME (cane, walker, BSC, shower chair) Criminal Activity/Legal Involvement Pertinent to Current Situation/Hospitalization: No - Comment as needed  Activities of Daily Living   ADL Screening (condition at time of  admission) Independently performs ADLs?: Yes (appropriate for developmental age) Is the patient deaf or have difficulty hearing?: No Does the patient have difficulty seeing, even when wearing glasses/contacts?: No Does the patient have difficulty concentrating, remembering, or making decisions?: No  Permission Sought/Granted Permission sought to share information with : Case Manager Permission granted to share information with : Yes, Verbal Permission Granted  Share Information with NAME: home     Permission granted to share info w Relationship: Ace Holder (dtr) [phone]     Emotional Assessment Appearance:: Appears stated age Attitude/Demeanor/Rapport: Gracious Affect (typically observed): Accepting Orientation: : Oriented to Self, Oriented to Place, Oriented to  Time, Oriented to Situation Alcohol / Substance Use: Not Applicable Psych Involvement: No (comment)  Admission diagnosis:  Pulmonary embolism (HCC) [I26.99] Multiple subsegmental pulmonary emboli without acute cor pulmonale (HCC) [I26.94] Patient Active Problem List   Diagnosis Date Noted   Pulmonary embolism (HCC) 03/16/2024   Ingrown toenail 11/08/2023   SOB (shortness of breath) 04/16/2023   Preoperative cardiovascular examination 04/15/2023   Coronary artery calcification seen on CT scan 04/15/2023   Generalized pain 11/09/2022   Situational anxiety 11/09/2022   Absence of bladder continence 07/21/2022   Impacted cerumen of left ear 07/21/2022   Lower extremity edema 05/01/2022   Itching 05/01/2022   Hospital discharge follow-up 04/27/2022   Cellulitis of right lower extremity 04/23/2022   Right leg swelling 04/10/2022  Acute cough 11/21/2021   Urinary urgency 11/03/2021   Right hip pain 07/25/2021   Abnormal x-ray of lumbar spine 07/25/2021   Acute cystitis with hematuria 06/05/2021   Vaginal irritation 05/20/2021   OAB (overactive bladder) 05/20/2021   Burning with urination 05/20/2021    Hypokalemia 01/01/2021   Anemia 01/01/2021   Acute respiratory failure with hypoxia (HCC) 10/31/2020   Post-menopausal bleeding 07/08/2020   History of pulmonary embolism 04/01/2020   Abnormal urine odor 06/27/2019   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   Other fatigue 11/24/2018   ILD (interstitial lung disease) (HCC) 11/24/2018   Pulmonary nodule 11/24/2018   Chronic diarrhea 05/11/2017   Rheumatoid arthritis (HCC) 01/24/2016   Preventative health care 09/11/2015   Acute left-sided low back pain without sciatica 09/11/2015   Benign paroxysmal positional vertigo 11/13/2014   Bilateral leg pain 04/04/2013   Severe obesity with body mass index (BMI) of 36.0 to 36.9 with serious comorbidity (HCC) 10/12/2012   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/12/2012   PSVT (paroxysmal supraventricular tachycardia) (HCC) 02/15/2012   Dizziness 06/13/2008   Prediabetes 01/10/2008   Hyperlipidemia 01/04/2008   Essential hypertension, benign 01/04/2008   GERD 01/04/2008   OSTEOARTHRITIS 01/04/2008   SUPERFICIAL THROMBOPHLEBITIS 10/05/1988   PCP:  Judithann Novas, MD Pharmacy:   [facility] - [place], [place] - [address] [place] [address] Phone: [phone] Fax: [phone]  [facility] - [place], [place] - [address] [place] [address] Phone: [phone] Fax: [phone]  [facility] [address], [place] - [address] [place] [address] Phone: [phone] Fax: [phone]  [facility] [address] [place] [address] Phone: [phone] Fax: [phone]  [place] [facility] [address] [place] [address] Phone: [phone] Fax: [phone]     Social Drivers of Health (SDOH) Social History: SDOH Screenings   Food Insecurity: No  Food Insecurity (03/18/2024)  Housing: Low Risk  (03/18/2024)  Transportation Needs: No Transportation Needs (03/18/2024)  Utilities: Not At Risk (03/18/2024)  Alcohol Screen: Low Risk  (05/10/2020)  Depression (PHQ2-9): Medium Risk (11/08/2023)  Financial Resource Strain: Low Risk  (05/14/2023)   [facility] from [facility]  Physical Activity: Sufficiently Active (02/25/2022)  Social Connections: Socially Integrated (03/17/2024)  Stress: No Stress Concern Present (02/25/2022)  Tobacco Use: Medium Risk (03/17/2024)  Health Literacy: Low Risk  (06/11/2023)   Received from [facility]   SDOH Interventions: Food Insecurity Interventions: Intervention Not Indicated, Inpatient TOC Housing Interventions: Intervention Not Indicated, Inpatient TOC Transportation Interventions: Intervention Not Indicated, Inpatient TOC Utilities Interventions: Intervention Not Indicated, Inpatient TOC   Readmission Risk Interventions     No data to display

## 2024-03-18 NOTE — Progress Notes (Signed)
 TOC meds in a secure bag delivered to pt in room by this RN.

## 2024-03-18 NOTE — Progress Notes (Signed)
 SATURATION QUALIFICATIONS: (This note is used to comply with regulatory documentation for home oxygen)  Patient Saturations on Room Air at Rest = 89%  Patient Saturations on Room Air while Ambulating = 85%  Patient Saturations on 2 Liters of oxygen while Ambulating = 93%  Please briefly explain why patient needs home oxygen:to maintain saturation > 88% during activities such as ambulation. Abelina Hoes PT Acute Rehabilitation Services Office [phone]

## 2024-03-18 NOTE — Evaluation (Signed)
 Physical Therapy Evaluation Patient Details Name: [name] MRN: [id number] DOB: 1941/08/27 Today's Date: 03/18/2024  History of Present Illness  [name] is a 83 y.o. female who recently was treated for postviral pneumonia and was referred by her provider to the emergency department due to worsening dyspnea associated with chest tightness with a positive D-dimer, bilateral PE's. PMH: antiphospholipid antibody syndrome, rheumatoid arthritis, history of PE, interstitial lung disease, coronary calcification, palpitations/PSVT, hyperlipidemia intolerant to statins, hypertension, migraine headaches, class II obesity, osteoarthritis, breast cancer, Paget disease , breast CA, , history of superficial phlebitis, history of pulmonary embolism  Clinical Impression  The patient reports being independent, using RW or rollator, reports walks her dogs daily at least a block.  Patient's O2 saturation noted to drop to 85% on RA, remained 93 %on 2 L. Patient  is not noted to be SOB.  Patient hopeful for DC today, no  further PT needs at this time, PT will sign off.         If plan is discharge home, recommend the following: Assist for transportation;Help with stairs or ramp for entrance   Can travel by private vehicle        Equipment Recommendations None recommended by PT  Recommendations for Other Services       Functional Status Assessment Patient has not had a recent decline in their functional status     Precautions / Restrictions Precautions Precautions: Knee Precaution/Restrictions Comments: monitor sats Restrictions Weight Bearing Restrictions Per Provider Order: No      Mobility  Bed Mobility Overal bed mobility: Independent                  Transfers                        Ambulation/Gait Ambulation/Gait assistance: Supervision Gait Distance (Feet): 200 Feet Assistive device: Rolling walker (2 wheels) Gait Pattern/deviations:  Step-through pattern       General Gait Details: gait is steady with RW  Stairs            Wheelchair Mobility     Tilt Bed    Modified Rankin (Stroke Patients Only)       Balance Overall balance assessment: No apparent balance deficits (not formally assessed)                                           Pertinent Vitals/Pain Pain Assessment Pain Assessment: No/denies pain    Home Living Family/patient expects to be discharged to:: Private residence Living Arrangements: Spouse/significant other;Children Available Help at Discharge: Available 24 hours/day Type of Home: House Home Access: Stairs to enter   [employer] of Steps: 2, has grab bars at steps Alternate Level Stairs-Number of Steps: has a stair glide Home Layout: Two level Home Equipment: Rollator (4 wheels)      Prior Function Prior Level of Function : Independent/Modified Independent;Driving             Mobility Comments: walks the dogs       Extremity/Trunk Assessment   Upper Extremity Assessment Upper Extremity Assessment: Overall WFL for tasks assessed    Lower Extremity Assessment Lower Extremity Assessment: Overall WFL for tasks assessed    Cervical / Trunk Assessment Cervical / Trunk Assessment: Kyphotic  Communication   Communication Communication: No apparent difficulties    Cognition Arousal: Alert Behavior  During Therapy: WFL for tasks assessed/performed   PT - Cognitive impairments: No apparent impairments                         Following commands: Intact       Cueing       General Comments      Exercises     Assessment/Plan    PT Assessment Patient does not need any further PT services  PT Problem List         PT Treatment Interventions      PT Goals (Current goals can be found in the Care Plan section)  Acute Rehab PT Goals Patient Stated Goal: go home PT Goal Formulation: All assessment and education  complete, DC therapy    Frequency       Co-evaluation               AM-PAC PT 6 Clicks Mobility  Outcome Measure Help needed turning from your back to your side while in a flat bed without using bedrails?: None Help needed moving from lying on your back to sitting on the side of a flat bed without using bedrails?: None Help needed moving to and from a bed to a chair (including a wheelchair)?: None Help needed standing up from a chair using your arms (e.g., wheelchair or bedside chair)?: None Help needed to walk in hospital room?: None Help needed climbing 3-5 steps with a railing? : A Little 6 Click Score: 23    End of Session Equipment Utilized During Treatment: Oxygen Activity Tolerance: Patient tolerated treatment well Patient left: in bed;with family/visitor present Nurse Communication: Mobility status PT Visit Diagnosis: Difficulty in walking, not elsewhere classified (R26.2)    Time: 1610-9604 PT Time Calculation (min) (ACUTE ONLY): 35 min   Charges:   PT Evaluation $PT Eval Low Complexity: 1 Low PT Treatments $Gait Training: 8-22 mins PT General Charges $$ ACUTE PT VISIT: 1 Visit         Abelina Hoes PT Acute Rehabilitation Services Office [phone]   Dareen Ebbing 03/18/2024, 2:16 PM

## 2024-03-18 NOTE — Care Management (Addendum)
 Oxygen tank from [employer] never came. Called the weekend [employer] number no answer. Messaged supervisor Gladys Lamp called no answer left a message to return the call asap [employer]  [employer] and he answered the phone, texted the patient information he is looking up information and will call me right back team updated

## 2024-03-18 NOTE — Plan of Care (Signed)

## 2024-03-18 NOTE — Progress Notes (Signed)
 PHARMACY - ANTICOAGULATION CONSULT NOTE  Pharmacy Consult for heparin  Indication: pulmonary embolus  Allergies  Allergen Reactions   Codeine Nausea And Vomiting   Hydrocodone  Other (See Comments)    Hallucinations    Patient Measurements: Height: 5' 3 (160 cm) Weight: 93.4 kg (205 lb 14.6 oz) IBW/kg (Calculated) : 52.4 HEPARIN  DW (KG): 73.9Heparin Dosing wt: 71.91kg HEPARIN  DW (KG): 73.9   Vital Signs: Temp: 98.5 F (36.9 C) (06/14 0018) Temp Source: Oral (06/14 0018) BP: 152/71 (06/14 0018) Pulse Rate: 65 (06/14 0018)  Labs: Recent Labs    03/16/24 1145 03/17/24 0652 03/17/24 1520 03/17/24 2346  HGB 12.9  --   --   --   HCT 40.0  --   --   --   PLT 209  --   --   --   HEPARINUNFRC  --  0.45 0.31 0.50  CREATININE 0.77  --   --  0.64    Estimated Creatinine Clearance: 56.9 mL/min (by C-G formula based on SCr of 0.64 mg/dL).   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 Pertinent PMH of antiphospholipid syndrome. Was previously on xarelto  for hx PE (2021-?09/2023). Also appears to have been on apixaban at some period? Not on PTA  anticoagulation at this time. Per RN report from patient, she has not been on [facility] for the past 7 months. Pharmacy has been consulted for heparin  dosing.   HL 0.5 therapeutic on 1250 units/hr No overt s/sx bleeding or issues with infusion noted.  Goal of Therapy:  Heparin  level 0.3-0.7 units/ml Monitor platelets by anticoagulation protocol: Yes   Plan:  continue heparin  infusion at 1250 units/hr  Daily heparin  level, CBC, and monitoring for bleeding F/u plans for anticoagulation   Thank you for allowing pharmacy to participate in this patient's care.  Beau Bound RPh 03/18/2024, 12:34 AM

## 2024-03-18 NOTE — Care Management Obs Status (Signed)
 MEDICARE OBSERVATION STATUS NOTIFICATION   Patient Details  Name: [name] MRN: [id number] Date of Birth: Jun 23, 1941   Medicare Observation Status Notification Given:  Yes    Levie Ream, RN 03/18/2024, 10:54 AM

## 2024-03-18 NOTE — Progress Notes (Signed)
 Echocardiogram 2D Echocardiogram has been performed.  Veatrice Eckstein N Cristen Murcia,RDCS 03/18/2024, 9:54 AM

## 2024-03-18 NOTE — Progress Notes (Signed)
 OT Cancellation Note  Patient Details Name: [name] MRN: [id number] DOB: May 20, 1941   Cancelled Treatment:    Reason Eval/Treat Not Completed: OT screened, no needs identified, will sign off. Evaluating PT informed this OT that the pt was independent with assessment of mobility and ADLs & she was not presenting with acute functional deficits that would warrant the need for therapy services. OT will sign off.    Sheralyn Dies, OTR/L 03/18/2024, 3:37 PM

## 2024-03-18 NOTE — Telephone Encounter (Signed)
 Kindly schedule for follow-up with Ramaswamy or APP-hospitalization follow-up  Interstitial lung disease  Treated in the hospital for PE, going home on oxygen

## 2024-03-18 NOTE — TOC Progression Note (Signed)
 Transition of Care [facility]) - Progression Note    Patient Details  Name: [name] MRN: [id number] Date of Birth: 1941-05-22  [facility] CM/SW Contact  Levie Ream, RN Phone Number: 03/18/2024, 2:28 PM  Clinical Narrative:    Orders received for home oxygen; pt previously identified [employer] as agency of choice; spoke w/ Ada at agency; she says service can be provided and travel tank will be delivered to room; agency contact info placed in follow up provider section of d/c instructions; pt notified.   Expected Discharge Plan: Home/Self Care Barriers to Discharge: Continued Medical Work up  Expected Discharge Plan and Services   Discharge Planning Services: CM Consult   Living arrangements for the past 2 months: Single Family Home                 DME Arranged: Oxygen DME Agency: [facility] Date DME Agency Contacted: 03/18/24 Time DME Agency Contacted: 52 Representative spoke with at DME Agency: [facility]             Social Determinants of Health (SDOH) Interventions SDOH Screenings   Food Insecurity: No Food Insecurity (03/18/2024)  Housing: Low Risk  (03/18/2024)  Transportation Needs: No Transportation Needs (03/18/2024)  Utilities: Not At Risk (03/18/2024)  Alcohol Screen: Low Risk  (05/10/2020)  Depression (PHQ2-9): Medium Risk (11/08/2023)  Financial Resource Strain: Low Risk  (05/14/2023)   Received from [facility]  Physical Activity: Sufficiently Active (02/25/2022)  Social Connections: Socially Integrated (03/17/2024)  Stress: No Stress Concern Present (02/25/2022)  Tobacco Use: Medium Risk (03/17/2024)  Health Literacy: Low Risk  (06/11/2023)   Received from [facility]    Readmission Risk Interventions     No data to display

## 2024-03-18 NOTE — Progress Notes (Signed)
 SATURATION QUALIFICATIONS: (This note is used to comply with regulatory documentation for home oxygen)   Patient Saturations on Room Air at Rest = 89%   Patient Saturations on Room Air while Ambulating = 85%   Patient Saturations on 2 Liters of oxygen while Ambulating = 93%   Please briefly explain why patient needs home oxygen:to maintain saturation > 88% during activities such as ambulation.

## 2024-03-20 ENCOUNTER — Telehealth: Payer: Self-pay

## 2024-03-20 NOTE — Discharge Summary (Signed)
 Physician Discharge Summary   Patient: [name] MRN: [id number] DOB: [birth year]  Admit date:     03/16/2024  Discharge date: 03/18/2024  Discharge Physician: Aura Leeds, DO   PCP: Judithann Novas, MD   Recommendations at discharge:   Follow up w/ PCP within 1 to 2 weeks and repeat CBC, CMP, mag, Phos within 1 week Follow-up with hematology Dr. Lee Public in outpatient setting within 1 to 2 weeks Follow-up with pulmonary in outpatient setting with Dr. Aubery Blare or APP Follow-up with rheumatology in outpatient setting  Discharge Diagnoses: Principal Problem:   Pulmonary embolism (HCC) Active Problems:   Hyperlipidemia   Essential hypertension, benign   Prediabetes   Rheumatoid arthritis (HCC)   ILD (interstitial lung disease) (HCC)  Resolved Problems:   * No resolved hospital problems. *  Hospital Course: [name] is a 83 y.o. female with medical history significant of antiphospholipid antibody syndrome, rheumatoid arthritis, history of PE, interstitial lung disease, coronary calcification, palpitations/PSVT, hyperlipidemia intolerant to statins, hypertension, migraine headaches, class II obesity, osteoarthritis, breast cancer, Paget disease of the breast, history of radiation therapy, history of superficial phlebitis, history of pulmonary embolism who recently was treated for postviral pneumonia and was referred by her provider to the emergency department due to worsening dyspnea associated with chest tightness with a positive D-dimer.  Found to have an acute PE.  She desaturated on home O2 screen but was transitioned to oral anticoagulation.  She did well and was medically stable for discharge only to follow-up with PCP, pulmonary and hematology outpatient setting as well as rheumatology.  Assessment and Plan:   Pulmonary embolism (HCC): Observation/telemetry. Continue supplemental oxygen. Continue heparin  infusion and changed to apixaban on discharge.  Checked  echocardiogram and showed an EF of 50 to 55% with no right ventricular systolic dysfunction and grade 1 diastolic dysfunction.  Lower extremity venous duplex showed no DVTs.  Will need to follow-up with hematology outpatient setting   Acute respiratory failure with hypoxia: In the setting of PE and ILD.  Desaturated on amatory home O2 screen and will be discharged on home oxygen.  Will need follow-up with PCP and pulmonary in outpatient setting appointment being scheduled.  ILD (interstitial lung disease) [facility]): Patient and daughter stated that treatment is making her jittery. Will decrease prednisone  to 40 mg a day. Will use albuterol  MDI instead of neb. Continue Augmentin  twice daily. Begin guaifenesin 600 mg p.o. twice daily.   Leukocytosis: In the setting of PE. CTM and Trend and repeat CBC w/in 1 week.    Hyperlipidemia: Intolerant to statins. Follow-up with primary care provider.   Essential hypertension, benign: Continue losartan  50 mg p.o. daily.   Prediabetes: Will follow-up fasting blood glucose.   Rheumatoid arthritis (HCC): Continue hydroxychloroquine. Follow-up rheumatology as an outpatient.  Hypoalbuminemia: Patient's Albumin Trend went from 4.1 -> 3.2 -> 3.1. CTM and Trend and repeat CMP w/in 1 week  Class III Obesity: Complicates overall prognosis and care. Estimated body mass index is 36.48 kg/m as calculated from the following:   Height as of this encounter: 5' 3 (1.6 m).   Weight as of this encounter: 93.4 kg. Weight Loss and Dietary Counseling given  Consultants: None Procedures performed: ECHO; LE Venous Duplex   Disposition: Home  Diet recommendation:  Discharge Diet Orders (From admission, onward)     Start     Ordered   03/18/24 0000  Diet - low sodium heart healthy        03/18/24 1446  Cardiac and Carb modified diet DISCHARGE MEDICATION: Allergies as of 03/18/2024       Reactions   Codeine Nausea And Vomiting   Hydrocodone  Other (See  Comments)   Hallucinations        Medication List     STOP taking these medications    azithromycin  250 MG tablet Commonly known as: ZITHROMAX    doxycycline  100 MG tablet Commonly known as: VIBRA -TABS       TAKE these medications    acetaminophen  500 MG tablet Commonly known as: TYLENOL  Take 500 mg by mouth every 4 (four) hours as needed for mild pain (pain score 1-3) or moderate pain (pain score 4-6).   albuterol  108 (90 Base) MCG/ACT inhaler Commonly known as: VENTOLIN  HFA Inhale 2 puffs into the lungs every 6 (six) hours as needed for wheezing or shortness of breath.   amoxicillin -clavulanate 875-125 MG tablet Commonly known as: AUGMENTIN  Take 1 tablet by mouth 2 (two) times daily.   benzonatate  200 MG capsule Commonly known as: TESSALON  Take 1 capsule (200 mg total) by mouth 2 (two) times daily as needed for cough.   Eliquis DVT/PE Starter Pack Generic drug: Apixaban Starter Pack (10mg  and 5mg ) Take as directed on package: start with two-5mg  tablets twice daily for 7 days. On day 8, switch to one-5mg  tablet twice daily.   folic acid  1 MG tablet Commonly known as: FOLVITE  Take 2 mg by mouth daily.   guaiFENesin 600 MG 12 hr tablet Commonly known as: MUCINEX Take 1 tablet (600 mg total) by mouth 2 (two) times daily for 5 days.   hydroxychloroquine 200 MG tablet Commonly known as: PLAQUENIL Take 200 mg by mouth 2 (two) times daily.   losartan  50 MG tablet Commonly known as: COZAAR  Take 1 tablet by mouth once daily   ondansetron  4 MG tablet Commonly known as: ZOFRAN  Take 1 tablet (4 mg total) by mouth every 6 (six) hours as needed for nausea.   predniSONE  20 MG tablet Commonly known as: DELTASONE  3 tabs by mouth daily x 3 days, then 2 tabs by mouth daily x 2 days then 1 tab by mouth daily x 2 days What changed:  how much to take how to take this when to take this additional instructions   promethazine -dextromethorphan 6.25-15 MG/5ML  syrup Commonly known as: PROMETHAZINE -DM Take 5 mLs by mouth at bedtime as needed for cough.        Follow-up Information     [facility] up.   Contact information: [address] [place] [address] [phone]                Discharge Exam: Filed Weights   03/16/24 2100 03/17/24 1214  Weight: 93.6 kg 93.4 kg   Vitals:   03/18/24 1317 03/18/24 1325  BP:  (!) 145/69  Pulse:  (!) 57  Resp:  18  Temp:  98.5 F (36.9 C)  SpO2: 94% 96%   Examination: Physical Exam:  Constitutional: WN/WD, obese Caucasian female in no acute distress Respiratory: Diminished to auscultation bilaterally with some coarse breath sounds, no wheezing, rales, rhonchi or crackles. Normal respiratory effort and patient is not tachypenic. No accessory muscle use.  Unlabored breathing Cardiovascular: RRR, no murmurs / rubs / gallops. S1 and S2 auscultated.  Mild edema Abdomen: Soft, non-tender, distended secondary to body habitus. Bowel sounds positive.  GU: Deferred. Musculoskeletal: No clubbing / cyanosis of digits/nails. No joint deformity upper and lower extremities.  Skin: No rashes, lesions, ulcers on  limited skin evaluation. No induration; Warm and dry.  Neurologic: CN 2-12 grossly intact with no focal deficits. Romberg sign and cerebellar reflexes not assessed.  Psychiatric: Normal judgment and insight. Alert and oriented x 3. Normal mood and appropriate affect.   Condition at discharge: stable  The results of significant diagnostics from this hospitalization (including imaging, microbiology, ancillary and laboratory) are listed below for reference.   Imaging Studies: DG CHEST PORT 1 VIEW Result Date: 03/18/2024 CLINICAL DATA:  [id number] Dyspnea [id number] EXAM: PORTABLE CHEST 1 VIEW COMPARISON:  March 10, 2024 FINDINGS: The cardiomediastinal silhouette is unchanged in contour.Atherosclerotic calcifications. No pleural effusion. No pneumothorax. No acute pleuroparenchymal  abnormality. Bilateral surgical clips. IMPRESSION: No acute cardiopulmonary abnormality. Electronically Signed   By: Clancy Crimes M.D.   On: 03/18/2024 16:10   ECHOCARDIOGRAM COMPLETE Result Date: 03/18/2024    ECHOCARDIOGRAM REPORT   Patient Name:   [name] Date of Exam: 03/18/2024 Medical Rec #:  [id number]            Height:       63.0 in Accession #:    [id number]           Weight:       205.9 lb Date of Birth:  Apr 29, 1941            BSA:          1.958 m Patient Age:    82 years             BP:           149/74 mmHg Patient Gender: F                    HR:           59 bpm. Exam Location:  Inpatient Procedure: 2D Echo, Color Doppler, Cardiac Doppler and Intracardiac            Opacification Agent (Both Spectral and Color Flow Doppler were            utilized during procedure). Indications:    Pulmonary Embolus  History:        Patient has prior history of Echocardiogram examinations, most                 recent 04/19/2023. CAD, Arrythmias:Dysrhythmia,                 Signs/Symptoms:Shortness of Breath; Risk Factors:Hypertension,                 Dyslipidemia and Former Smoker. Breast Cancer.  Sonographer:    Travis Friedman RDCS Referring Phys: [id number] DAVID MANUEL ORTIZ IMPRESSIONS  1. Left ventricular ejection fraction, by estimation, is 50 to 55%. The left ventricle has low normal function. The left ventricle has no regional wall motion abnormalities. There is mild left ventricular hypertrophy. Left ventricular diastolic parameters are consistent with Grade I diastolic dysfunction (impaired relaxation).  2. Right ventricular systolic function is normal. The right ventricular size is mildly enlarged. There is normal pulmonary artery systolic pressure. The estimated right ventricular systolic pressure is 29.8 mmHg.  3. The mitral valve is normal in structure. Trivial mitral valve regurgitation. No evidence of mitral stenosis.  4. The aortic valve is tricuspid. Aortic valve regurgitation is trivial.  Aortic valve sclerosis/calcification is present, without any evidence of aortic stenosis.  5. The inferior vena cava is normal in size with greater than 50% respiratory variability, suggesting right atrial pressure of 3 mmHg. FINDINGS  Left  Ventricle: Left ventricular ejection fraction, by estimation, is 50 to 55%. The left ventricle has low normal function. The left ventricle has no regional wall motion abnormalities. The left ventricular internal cavity size was normal in size. There is mild left ventricular hypertrophy. Left ventricular diastolic parameters are consistent with Grade I diastolic dysfunction (impaired relaxation). Right Ventricle: The right ventricular size is mildly enlarged. No increase in right ventricular wall thickness. Right ventricular systolic function is normal. There is normal pulmonary artery systolic pressure. The tricuspid regurgitant velocity is 2.59  m/s, and with an assumed right atrial pressure of 3 mmHg, the estimated right ventricular systolic pressure is 29.8 mmHg. Left Atrium: Left atrial size was normal in size. Right Atrium: Right atrial size was normal in size. Pericardium: There is no evidence of pericardial effusion. Mitral Valve: The mitral valve is normal in structure. Trivial mitral valve regurgitation. No evidence of mitral valve stenosis. Tricuspid Valve: The tricuspid valve is normal in structure. Tricuspid valve regurgitation is trivial. Aortic Valve: The aortic valve is tricuspid. Aortic valve regurgitation is trivial. Aortic valve sclerosis/calcification is present, without any evidence of aortic stenosis. Aortic valve mean gradient measures 5.0 mmHg. Aortic valve peak gradient measures 9.5 mmHg. Aortic valve area, by VTI measures 2.01 cm. Pulmonic Valve: The pulmonic valve was not well visualized. Pulmonic valve regurgitation is not visualized. Aorta: The aortic root and ascending aorta are structurally normal, with no evidence of dilitation. Venous: The  inferior vena cava is normal in size with greater than 50% respiratory variability, suggesting right atrial pressure of 3 mmHg. IAS/Shunts: The interatrial septum was not well visualized.  LEFT VENTRICLE PLAX 2D LVIDd:         5.50 cm      Diastology LVIDs:         3.80 cm      LV e' medial:    5.00 cm/s LV PW:         1.10 cm      LV E/e' medial:  16.8 LV IVS:        1.00 cm      LV e' lateral:   8.16 cm/s LVOT diam:     2.30 cm      LV E/e' lateral: 10.3 LV SV:         73 LV SV Index:   37 LVOT Area:     4.15 cm  LV Volumes (MOD) LV vol d, MOD A2C: 114.0 ml LV vol d, MOD A4C: 146.0 ml LV vol s, MOD A2C: 53.3 ml LV vol s, MOD A4C: 67.1 ml LV SV MOD A2C:     60.7 ml LV SV MOD A4C:     146.0 ml LV SV MOD BP:      63.8 ml RIGHT VENTRICLE             IVC RV Basal diam:  4.20 cm     IVC diam: 1.70 cm RV Mid diam:    2.60 cm RV S prime:     14.40 cm/s TAPSE (M-mode): 2.8 cm LEFT ATRIUM             Index        RIGHT ATRIUM           Index LA diam:        3.30 cm 1.69 cm/m   RA Area:     15.60 cm LA Vol (A2C):   60.4 ml 30.85 ml/m  RA Volume:   41.30 ml  21.10 ml/m  LA Vol (A4C):   32.1 ml 16.40 ml/m LA Biplane Vol: 48.3 ml 24.67 ml/m  AORTIC VALVE AV Area (Vmax):    2.04 cm AV Area (Vmean):   1.90 cm AV Area (VTI):     2.01 cm AV Vmax:           154.00 cm/s AV Vmean:          108.000 cm/s AV VTI:            0.361 m AV Peak Grad:      9.5 mmHg AV Mean Grad:      5.0 mmHg LVOT Vmax:         75.70 cm/s LVOT Vmean:        49.500 cm/s LVOT VTI:          0.175 m LVOT/AV VTI ratio: 0.48  AORTA Ao Root diam: 2.80 cm Ao Asc diam:  3.40 cm MITRAL VALVE                TRICUSPID VALVE MV Area (PHT): 3.85 cm     TR Peak grad:   26.8 mmHg MV Decel Time: 197 msec     TR Vmax:        259.00 cm/s MV E velocity: 83.90 cm/s MV A velocity: 103.00 cm/s  SHUNTS MV E/A ratio:  0.81         Systemic VTI:  0.18 m                             Systemic Diam: 2.30 cm Carson Clara MD Electronically signed by Carson Clara MD  Signature Date/Time: 03/18/2024/12:53:20 PM    Final    VAS US  LOWER EXTREMITY VENOUS (DVT) Result Date: 03/18/2024  Lower Venous DVT Study Patient Name:  [name]  Date of Exam:   03/17/2024 Medical Rec #: [id number]             Accession #:    [id number] Date of Birth: 1941/04/26             Patient Gender: F Patient Age:   83 years Exam Location:  [facility] Procedure:      VAS US  LOWER EXTREMITY VENOUS (DVT) Referring Phys: DAVID ORTIZ --------------------------------------------------------------------------------  Indications: Pulmonary embolism, and SOB.  Risk Factors: Breast cancer 2020 Rheumatoid arthritis. Limitations: Body habitus, pain with compression maneuvers, and poor ultrasound/tissue interface. Comparison Study: Prior negative right LEV done 04/10/22. Prior negative bilateral                   LEV done 04/02/20 Performing Technologist: Carleene Chase RVS  Examination Guidelines: A complete evaluation includes B-mode imaging, spectral Doppler, color Doppler, and power Doppler as needed of all accessible portions of each vessel. Bilateral testing is considered an integral part of a complete examination. Limited examinations for reoccurring indications may be performed as noted. The reflux portion of the exam is performed with the patient in reverse Trendelenburg.  +---------+---------------+---------+-----------+----------+-------------------+ RIGHT    CompressibilityPhasicitySpontaneityPropertiesThrombus Aging      +---------+---------------+---------+-----------+----------+-------------------+ CFV      Full           Yes      Yes                                      +---------+---------------+---------+-----------+----------+-------------------+ SFJ      Full                                                             +---------+---------------+---------+-----------+----------+-------------------+  FV Prox  Full                                                              +---------+---------------+---------+-----------+----------+-------------------+ FV Mid   Full           Yes      Yes                                      +---------+---------------+---------+-----------+----------+-------------------+ FV DistalFull                                                             +---------+---------------+---------+-----------+----------+-------------------+ PFV      Full                                                             +---------+---------------+---------+-----------+----------+-------------------+ POP      Full           Yes      Yes                                      +---------+---------------+---------+-----------+----------+-------------------+ PTV      Full                                                             +---------+---------------+---------+-----------+----------+-------------------+ PERO                                                  Not well visualized +---------+---------------+---------+-----------+----------+-------------------+   +---------+---------------+---------+-----------+----------+-------------------+ LEFT     CompressibilityPhasicitySpontaneityPropertiesThrombus Aging      +---------+---------------+---------+-----------+----------+-------------------+ CFV      Full           Yes      Yes                                      +---------+---------------+---------+-----------+----------+-------------------+ SFJ      Full                                                             +---------+---------------+---------+-----------+----------+-------------------+ FV Prox  Full                                                             +---------+---------------+---------+-----------+----------+-------------------+  FV Mid   Full                                                              +---------+---------------+---------+-----------+----------+-------------------+ FV DistalFull           Yes      Yes                                      +---------+---------------+---------+-----------+----------+-------------------+ PFV      Full                                                             +---------+---------------+---------+-----------+----------+-------------------+ POP      Full           Yes      Yes                                      +---------+---------------+---------+-----------+----------+-------------------+ PTV      Full                                                             +---------+---------------+---------+-----------+----------+-------------------+ PERO                                                  Not well visualized +---------+---------------+---------+-----------+----------+-------------------+     Summary: RIGHT: - There is no evidence of deep vein thrombosis in the lower extremity. However, portions of this examination were limited- see technologist comments above.  - No cystic structure found in the popliteal fossa. - Ultrasound characteristics of enlarged lymph nodes are noted in the groin.  LEFT: - There is no evidence of deep vein thrombosis in the lower extremity. However, portions of this examination were limited- see technologist comments above.  - No cystic structure found in the popliteal fossa.  *See table(s) above for measurements and observations. Electronically signed by Genny Kid MD on 03/18/2024 at 10:56:44 AM.    Final    CT Angio Chest PE W/Cm &/Or Wo Cm Result Date: 03/16/2024 CLINICAL DATA:  Positive D-dimer chest tightness EXAM: CT ANGIOGRAPHY CHEST WITH CONTRAST TECHNIQUE: Multidetector CT imaging of the chest was performed using the standard protocol during bolus administration of intravenous contrast. Multiplanar CT image reconstructions and MIPs were obtained to evaluate the vascular anatomy.  RADIATION DOSE REDUCTION: This exam was performed according to the departmental dose-optimization program which includes automated exposure control, adjustment of the mA and/or kV according to patient size and/or use of iterative reconstruction technique. CONTRAST:  80mL OMNIPAQUE  IOHEXOL  350 MG/ML SOLN COMPARISON:  Chest x-ray 03/10/2024, CT 11/25/2020 FINDINGS: Cardiovascular: Satisfactory opacification  of the pulmonary arteries to the segmental level. Positive for right upper lobe are, segmental and sub segmental PE. Right lower lobe segmental and subsegmental PE. Small volume thrombus within the left lower lobe are pulmonary artery with thrombus in left lower lobe segmental and subsegmental vessels. Positive for left upper lobe segmental and subsegmental emboli. No evidence for right heart strain, RV LV ratio is 0.72. Moderate aortic atherosclerosis. No aneurysm. Coronary vascular calcification. Normal cardiac size. No pericardial effusion Mediastinum/Nodes: Patent trachea. No thyroid  mass. No suspicious lymph nodes. Lungs/Pleura: No acute airspace disease, pleural effusion, or pneumothorax. Mild subpleural reticulation. Stable 5 mm left lower lobe pulmonary nodule on series 6, image 108. 3 mm right upper lobe pulmonary nodule on series 6, image 62, not definitively seen on prior. Upper Abdomen: No acute finding.  Gallstones Musculoskeletal: No acute osseous abnormality. Review of the MIP images confirms the above findings. IMPRESSION: 1. Positive for acute bilateral pulmonary emboli. No evidence for right heart strain. 2. No acute airspace disease. 3. 3 mm right upper lobe pulmonary nodule, not definitively seen on prior. No follow-up needed if patient is low-risk.This recommendation follows the consensus statement: Guidelines for Management of Incidental Pulmonary Nodules Detected on CT Images: From the Fleischner Society 2017; Radiology 2017; 284:228-243. Critical Value/emergent results were called by  telephone at the time of interpretation on 03/16/2024 at 8:46 pm to provider MELANIE BELFI , who verbally acknowledged these results. Aortic Atherosclerosis (ICD10-I70.0). Electronically Signed   By: Esmeralda Hedge M.D.   On: 03/16/2024 20:46   DG Chest 2 View Result Date: 03/10/2024 CLINICAL DATA:  Shortness of breath, cough EXAM: CHEST - 2 VIEW COMPARISON:  September 01, 2023 FINDINGS: Minimal residual right lower lobe right middle lobe infiltrates without consolidations. Left lung clear Heart normal size IMPRESSION: Right middle lobe right lower lobe infiltrates, Electronically Signed   By: Fredrich Jefferson M.D.   On: 03/10/2024 11:32   Microbiology: Results for orders placed or performed during the hospital encounter of 03/16/24  Culture, blood (routine x 2)     Status: None   Collection Time: 03/16/24  7:55 PM   Specimen: BLOOD  Result Value Ref Range Status   Specimen Description   Final    BLOOD RIGHT ANTECUBITAL Performed at [facility], [address], [place], [place] [address]    Special Requests   Final    Blood Culture adequate volume BOTTLES DRAWN AEROBIC AND ANAEROBIC Performed at [facility], [address], [place], [place] [address]    Culture   Final    NO GROWTH 5 DAYS Performed at [facility], [address], [place], [place] [address]    Report Status 03/21/2024 FINAL  Final  Culture, blood (routine x 2)     Status: None   Collection Time: 03/16/24  8:05 PM   Specimen: BLOOD LEFT WRIST  Result Value Ref Range Status   Specimen Description   Final    BLOOD LEFT WRIST Performed at [facility], [address], [place], [place] [address]    Special Requests   Final    BOTTLES DRAWN AEROBIC AND ANAEROBIC Blood Culture results may not be optimal due to an inadequate volume of blood received in culture bottles Performed at [facility], [address], [place], [place] [address]     Culture   Final    NO GROWTH 5 DAYS Performed at [facility], [address] [place], [place] [address]  Report Status 03/21/2024 FINAL  Final   Labs: CBC: Recent Labs  Lab 03/16/24 1145 03/17/24 2346 03/18/24 0838  WBC 9.9 9.0 11.8*  NEUTROABS 8,227*  --  8.9*  HGB 12.9 11.5* 12.1  HCT 40.0 36.3 39.8  MCV 88.7 91.9 94.5  PLT 209 215 214   Basic Metabolic Panel: Recent Labs  Lab 03/16/24 1145 03/17/24 2346 03/18/24 0838  NA 140 135 140  K 4.9 4.4 4.4  CL 104 101 105  CO2 26 24 26   GLUCOSE 126* 159* 99  BUN 14 14 14   CREATININE 0.77 0.64 0.61  CALCIUM 9.6 8.5* 8.6*  MG  --   --  2.2  PHOS  --   --  4.1   Liver Function Tests: Recent Labs  Lab 03/17/24 2346 03/18/24 0838  AST 27 20  ALT 23 21  ALKPHOS 87 76  BILITOT 0.7 0.4  PROT 7.0 6.7  ALBUMIN 3.2* 3.1*   CBG: No results for input(s): GLUCAP in the last 168 hours.  Discharge time spent: greater than 30 minutes.  Signed: Aura [place], DO [facility] 03/21/2024

## 2024-03-20 NOTE — Telephone Encounter (Signed)
 PT refused appt offered as too early in the morning for her. I adv no other appts and we would contact her in July once new sched is out. Adjusted existing recall dates. NFN

## 2024-03-20 NOTE — Transitions of Care (Post Inpatient/ED Visit) (Signed)
 03/20/2024  Name: [name] MRN: [id number] DOB: 12-Jul-1941  Today's TOC FU Call Status: Today's TOC FU Call Status:: Successful TOC FU Call Completed TOC FU Call Complete Date: 03/20/24 Patient's Name and Date of Birth confirmed.  Transition Care Management Follow-up Telephone Call Date of Discharge: 03/18/24 Discharge Facility: [facility] [place]) Type of Discharge: Inpatient Admission Primary Inpatient Discharge Diagnosis:: Pulmonary Embolism How have you been since you were released from the hospital?: Better Any questions or concerns?: No  Items Reviewed: Did you receive and understand the discharge instructions provided?: Yes Medications obtained,verified, and reconciled?: Yes (Medications Reviewed) Any new allergies since your discharge?: No Dietary orders reviewed?: NA Do you have support at home?: Yes People in Home [RPT]: spouse, child(ren), adult  Medications Reviewed Today: Medications Reviewed Today     Reviewed by Cathye Coca, LPN (Licensed Practical Nurse) on 03/20/24 at 1414  Med List Status: <None>   Medication Order Taking? Sig Documenting Provider Last Dose Status Informant  acetaminophen  (TYLENOL ) 500 MG tablet [id number] Yes Take 500 mg by mouth every 4 (four) hours as needed for mild pain (pain score 1-3) or moderate pain (pain score 4-6). [provider]  Active Self, Pharmacy Records, Multiple Informants  albuterol  (VENTOLIN  HFA) 108 (90 Base) MCG/ACT inhaler [id number] Yes Inhale 2 puffs into the lungs every 6 (six) hours as needed for wheezing or shortness of breath. Judithann Novas, MD  Active Self, Pharmacy Records, Multiple Informants  amoxicillin -clavulanate (AUGMENTIN ) 875-125 MG tablet [id number] Yes Take 1 tablet by mouth 2 (two) times daily.  Patient taking differently: Take 1 tablet by mouth 2 (two) times daily.   Judithann Novas, MD  Active Self, Pharmacy Records, Multiple Informants  APIXABAN Herby Lolling) VTE STARTER PACK (10MG   AND 5MG ) [id number] Yes Take as directed on package: start with two-5mg  tablets twice daily for 7 days. On day 8, switch to one-5mg  tablet twice daily. Sheikh, Omair Ward, DO  Active   benzonatate  (TESSALON ) 200 MG capsule [id number] Yes Take 1 capsule (200 mg total) by mouth 2 (two) times daily as needed for cough. Judithann Novas, MD  Active Self, Pharmacy Records, Multiple Informants  folic acid  (FOLVITE ) 1 MG tablet [id number] Yes Take 2 mg by mouth daily. [provider]  Active Self, Pharmacy Records, Multiple Informants  guaiFENesin (MUCINEX) 600 MG 12 hr tablet [id number] Yes Take 1 tablet (600 mg total) by mouth 2 (two) times daily for 5 days. Sheikh, Omair [place], [place]  Active   hydroxychloroquine (PLAQUENIL) 200 MG tablet [id number] Yes Take 200 mg by mouth 2 (two) times daily. [provider]  Active Self, Pharmacy Records, Multiple Informants  losartan  (COZAAR ) 50 MG tablet [id number] Yes Take 1 tablet by mouth once daily Dorothe Gaster, NP  Active Self, Pharmacy Records, Multiple Informants  ondansetron  (ZOFRAN ) 4 MG tablet [id number] Yes Take 1 tablet (4 mg total) by mouth every 6 (six) hours as needed for nausea. Sheikh, Omair [place], DO  Active   predniSONE  (DELTASONE ) 20 MG tablet [id number] Yes 3 tabs by mouth daily x 3 days, then 2 tabs by mouth daily x 2 days then 1 tab by mouth daily x 2 days  Patient taking differently: Take 20-60 mg by mouth See admin instructions. Take three tablets by mouth for three days. Take two tablets by mouth for two days. Take one tablet by mouth for two days.   Judithann Novas, MD  Active Self, Pharmacy Records, Multiple Informants  promethazine -dextromethorphan (PROMETHAZINE -DM) 6.25-15 MG/5ML  syrup [id number]  Take 5 mLs by mouth at bedtime as needed for cough.  Patient not taking: Reported on 03/20/2024   Judithann Novas, MD  Active Self, Pharmacy Records, Multiple Informants  regadenoson  (LEXISCAN ) injection SOLN 0.4 mg [id number]   Hazle Lites, MD  Active   technetium tetrofosmin  (TC-MYOVIEW ) injection 32.7 millicurie [id number]   Hazle Lites, MD  Active             Home Care and Equipment/Supplies: Were Home Health Services Ordered?: NA Any new equipment or medical supplies ordered?: Yes Name of Medical supply agency?: [facility] Were you able to get the equipment/medical supplies?: Yes Do you have any questions related to the use of the equipment/supplies?: No  Functional Questionnaire: Do you need assistance with bathing/showering or dressing?: No Do you need assistance with meal preparation?: No Do you need assistance with eating?: No Do you have difficulty maintaining continence: No Do you need assistance with getting out of bed/getting out of a chair/moving?: No Do you have difficulty managing or taking your medications?: No  Follow up appointments reviewed: PCP Follow-up appointment confirmed?: Yes Date of PCP follow-up appointment?: 03/31/24 Follow-up Provider: Dr. Herby Lolling Specialist [facility] Follow-up appointment confirmed?: No (see telephone note- pt declined initial visit with Pulmonology) Reason Specialist Follow-Up Not Confirmed: Patient has Specialist Provider Number and will Call for Appointment Do you need transportation to your follow-up appointment?: No Do you understand care options if your condition(s) worsen?: Yes-patient verbalized understanding    SIGNATURE [place], LPN [facility] Health Advisor [place] [facility] You Are. We Are. [facility] Direct Dial [phone]

## 2024-03-20 NOTE — Hospital Course (Signed)
 [name] is a 83 y.o. female with medical history significant of antiphospholipid antibody syndrome, rheumatoid arthritis, history of PE, interstitial lung disease, coronary calcification, palpitations/PSVT, hyperlipidemia intolerant to statins, hypertension, migraine headaches, class II obesity, osteoarthritis, breast cancer, Paget disease of the breast, history of radiation therapy, history of superficial phlebitis, history of pulmonary embolism who recently was treated for postviral pneumonia and was referred by her provider to the emergency department due to worsening dyspnea associated with chest tightness with a positive D-dimer. No fever, chills, hemoptysis, positive for cough, wheezing. No  palpitations, diaphoresis, PND, orthopnea or pitting edema of the lower extremities. No abdominal pain, nausea, emesis, diarrhea, constipation, melena or hematochezia. No flank pain, dysuria, frequency or hematuria. No polyuria, polydipsia, polyphagia or blurred vision.  She was taken off DOAC last year.   Lab work: CBC was unremarkable.  D-dimer was 16.23.  BMP showed a glucose of 126 mg/dL, but was otherwise within expected range.  Lactic acid x 2 was normal.   Assessment and Plan:   Pulmonary embolism (HCC) Observation/telemetry. Continue supplemental oxygen. Continue heparin  infusion. Check echocardiogram. Check lower extremity Doppler. Will need to resume anticoagulation permanently.   Active Problems:   ILD (interstitial lung disease) (HCC) Patient and daughter stated that treatment is making her jittery. -Will decrease prednisone  to 40 mg a day. -Will use albuterol  MDI instead of neb. Continue Augmentin  twice daily. Begin guaifenesin 600 mg p.o. twice daily.     Hyperlipidemia Intolerant to statins. Follow-up with primary care provider.     Essential hypertension, benign Continue losartan  50 mg p.o. daily.     Prediabetes Will follow-up fasting blood glucose.    Rheumatoid arthritis (HCC):  Continue hydroxychloroquine. Follow-up rheumatology as an outpatient.  Class III Obesity: Complicates overall prognosis and care. Estimated body mass index is 36.48 kg/m as calculated from the following:   Height as of this encounter: 5' 3 (1.6 m).   Weight as of this encounter: 93.4 kg. Weight Loss and Dietary Counseling given

## 2024-03-21 LAB — CULTURE, BLOOD (ROUTINE X 2)
Culture: NO GROWTH
Culture: NO GROWTH
Special Requests: ADEQUATE

## 2024-03-31 ENCOUNTER — Ambulatory Visit: Admitting: Family Medicine

## 2024-03-31 ENCOUNTER — Encounter: Payer: Self-pay | Admitting: Family Medicine

## 2024-03-31 ENCOUNTER — Ambulatory Visit: Payer: Self-pay | Admitting: Family Medicine

## 2024-03-31 VITALS — BP 130/60 | HR 88 | Temp 99.1°F | Ht 62.0 in | Wt 207.4 lb

## 2024-03-31 DIAGNOSIS — E8809 Other disorders of plasma-protein metabolism, not elsewhere classified: Secondary | ICD-10-CM

## 2024-03-31 DIAGNOSIS — I2699 Other pulmonary embolism without acute cor pulmonale: Secondary | ICD-10-CM | POA: Diagnosis not present

## 2024-03-31 DIAGNOSIS — D72829 Elevated white blood cell count, unspecified: Secondary | ICD-10-CM | POA: Diagnosis not present

## 2024-03-31 DIAGNOSIS — I5031 Acute diastolic (congestive) heart failure: Secondary | ICD-10-CM

## 2024-03-31 DIAGNOSIS — J189 Pneumonia, unspecified organism: Secondary | ICD-10-CM

## 2024-03-31 LAB — CBC WITH DIFFERENTIAL/PLATELET
Basophils Absolute: 0.1 10*3/uL (ref 0.0–0.1)
Basophils Relative: 0.7 % (ref 0.0–3.0)
Eosinophils Absolute: 0.3 10*3/uL (ref 0.0–0.7)
Eosinophils Relative: 3.6 % (ref 0.0–5.0)
HCT: 37.4 % (ref 36.0–46.0)
Hemoglobin: 12.3 g/dL (ref 12.0–15.0)
Lymphocytes Relative: 25.5 % (ref 12.0–46.0)
Lymphs Abs: 1.9 10*3/uL (ref 0.7–4.0)
MCHC: 32.8 g/dL (ref 30.0–36.0)
MCV: 86.6 fl (ref 78.0–100.0)
Monocytes Absolute: 0.8 10*3/uL (ref 0.1–1.0)
Monocytes Relative: 11 % (ref 3.0–12.0)
Neutro Abs: 4.5 10*3/uL (ref 1.4–7.7)
Neutrophils Relative %: 59.2 % (ref 43.0–77.0)
Platelets: 254 10*3/uL (ref 150.0–400.0)
RBC: 4.31 Mil/uL (ref 3.87–5.11)
RDW: 15.4 % (ref 11.5–15.5)
WBC: 7.6 10*3/uL (ref 4.0–10.5)

## 2024-03-31 LAB — COMPREHENSIVE METABOLIC PANEL WITH GFR
ALT: 14 U/L (ref 0–35)
AST: 18 U/L (ref 0–37)
Albumin: 3.8 g/dL (ref 3.5–5.2)
Alkaline Phosphatase: 87 U/L (ref 39–117)
BUN: 10 mg/dL (ref 6–23)
CO2: 31 meq/L (ref 19–32)
Calcium: 8.8 mg/dL (ref 8.4–10.5)
Chloride: 105 meq/L (ref 96–112)
Creatinine, Ser: 0.66 mg/dL (ref 0.40–1.20)
GFR: 80.22 mL/min (ref >60.00)
Glucose, Bld: 86 mg/dL (ref 70–99)
Potassium: 4.3 meq/L (ref 3.5–5.1)
Sodium: 141 meq/L (ref 135–145)
Total Bilirubin: 0.5 mg/dL (ref 0.2–1.2)
Total Protein: 6.5 g/dL (ref 6.0–8.3)

## 2024-03-31 LAB — MAGNESIUM: Magnesium: 1.9 mg/dL (ref 1.5–2.5)

## 2024-03-31 LAB — PHOSPHORUS: Phosphorus: 4.6 mg/dL (ref 2.3–4.6)

## 2024-03-31 NOTE — Progress Notes (Signed)
 Patient ID: [name], female    DOB: Jun 26, 1941, 83 y.o.   MRN: [id number]  This visit was conducted in person.  BP 130/60   Pulse 88   Temp 99.1 F (37.3 C) (Temporal)   Ht 5' 2 (1.575 m)   Wt 207 lb 6 oz (94.1 kg)   SpO2 94%   BMI 37.93 kg/m    CC:  Chief Complaint  Patient presents with   Hospitalization Follow-up    Subjective:   HPI: [name] is a 83 y.o. female presenting on 03/31/2024 for Hospitalization Follow-up   Admitted: 6/12  Discharged:  6/14 Recommendations at discharge:    Follow up w/ PCP within 1 to 2 weeks and repeat CBC, CMP, mag, Phos within 1 week Follow-up with hematology Dr. Odean in outpatient setting within 1 to 2 weeks.. she has appt 04/05/2024 Follow-up with pulmonary in outpatient setting with Dr. Bubba or APP  Follow-up with rheumatology in outpatient setting  She was treated for postviral pneumonia and was referred by myself to the emergency department due to worsening dyspnea associated with chest tightness with a positive D-dimer.  Chest CT showed acute PE.  Started on heparin  infusion and changed to Eliquis  at discharge Echocardiogram showed ejection fraction 50 to 55% with grade 1 diastolic dysfunction. No DVTs noted. She was discharged home on oxygen given hypoxia in the setting of PE and interstitial lung disease.  Prednisone  held given cause jitteriness.  Continued Augmentin  twice daily for a total of 10 days at discharge.  Of note in hospital albumin was lowered from 4-3.1.  Recommended recheck.  She is no longer requiring oxygen... running around  90-93% at rest and with exertion. No cough, no chest tightness. Stable  back to baseline SOB.  She notes today that Eliquis  is very expensive and is having difficulty paying for it and will nee to take lifelong.  Eating better now. No nausea.   Relevant past medical, surgical, family and social history reviewed and updated as indicated. Interim medical  history since our last visit reviewed. Allergies and medications reviewed and updated. Outpatient Medications Prior to Visit  Medication Sig Dispense Refill   acetaminophen  (TYLENOL ) 500 MG tablet Take 500 mg by mouth every 4 (four) hours as needed for mild pain (pain score 1-3) or moderate pain (pain score 4-6).     folic acid  (FOLVITE ) 1 MG tablet Take 2 mg by mouth daily.     hydroxychloroquine  (PLAQUENIL ) 200 MG tablet Take 200 mg by mouth 2 (two) times daily.     losartan  (COZAAR ) 50 MG tablet Take 1 tablet by mouth once daily 90 tablet 0   ondansetron  (ZOFRAN ) 4 MG tablet Take 1 tablet (4 mg total) by mouth every 6 (six) hours as needed for nausea. 20 tablet 0   APIXABAN  (ELIQUIS ) VTE STARTER PACK (10MG  AND 5MG ) Take as directed on package: start with two-5mg  tablets twice daily for 7 days. On day 8, switch to one-5mg  tablet twice daily. 74 each 0   albuterol  (VENTOLIN  HFA) 108 (90 Base) MCG/ACT inhaler Inhale 2 puffs into the lungs every 6 (six) hours as needed for wheezing or shortness of breath. 8 g 2   amoxicillin -clavulanate (AUGMENTIN ) 875-125 MG tablet Take 1 tablet by mouth 2 (two) times daily. (Patient taking differently: Take 1 tablet by mouth 2 (two) times daily.) 20 tablet 0   benzonatate  (TESSALON ) 200 MG capsule Take 1 capsule (200 mg total) by mouth 2 (two) times daily as  needed for cough. 20 capsule 0   predniSONE  (DELTASONE ) 20 MG tablet 3 tabs by mouth daily x 3 days, then 2 tabs by mouth daily x 2 days then 1 tab by mouth daily x 2 days (Patient taking differently: Take 20-60 mg by mouth See admin instructions. Take three tablets by mouth for three days. Take two tablets by mouth for two days. Take one tablet by mouth for two days.) 15 tablet 0   promethazine -dextromethorphan (PROMETHAZINE -DM) 6.25-15 MG/5ML syrup Take 5 mLs by mouth at bedtime as needed for cough. (Patient not taking: Reported on 03/20/2024) 118 mL 0   Facility-Administered Medications Prior to Visit   Medication Dose Route Frequency Provider Last Rate Last Admin   regadenoson  (LEXISCAN ) injection SOLN 0.4 mg  0.4 mg Intravenous Once Hilty, Kenneth C, MD       technetium tetrofosmin  (TC-MYOVIEW ) injection 32.7 millicurie  32.7 millicurie Intravenous Once PRN Hilty, Vinie BROCKS, MD         Per HPI unless specifically indicated in ROS section below Review of Systems  Constitutional:  Negative for fatigue and fever.  HENT:  Negative for ear pain.   Eyes:  Negative for pain.  Respiratory:  Negative for chest tightness and shortness of breath.   Cardiovascular:  Negative for chest pain, palpitations and leg swelling.  Gastrointestinal:  Negative for abdominal pain.  Genitourinary:  Negative for dysuria.   Objective:  BP 130/60   Pulse 88   Temp 99.1 F (37.3 C) (Temporal)   Ht 5' 2 (1.575 m)   Wt 207 lb 6 oz (94.1 kg)   SpO2 94%   BMI 37.93 kg/m   Wt Readings from Last 3 Encounters:  04/05/24 208 lb 6.4 oz (94.5 kg)  03/31/24 207 lb 6 oz (94.1 kg)  03/17/24 205 lb 14.6 oz (93.4 kg)      Physical Exam Constitutional:      General: She is not in acute distress.    Appearance: Normal appearance. She is well-developed. She is not ill-appearing or toxic-appearing.  HENT:     Head: Normocephalic.     Right Ear: Hearing, tympanic membrane, ear canal and external ear normal. Tympanic membrane is not erythematous, retracted or bulging.     Left Ear: Hearing, tympanic membrane, ear canal and external ear normal. Tympanic membrane is not erythematous, retracted or bulging.     Nose: No mucosal edema or rhinorrhea.     Right Sinus: No maxillary sinus tenderness or frontal sinus tenderness.     Left Sinus: No maxillary sinus tenderness or frontal sinus tenderness.     Mouth/Throat:     Pharynx: Uvula midline.  Eyes:     General: Lids are normal. Lids are everted, no foreign bodies appreciated.     Conjunctiva/sclera: Conjunctivae normal.     Pupils: Pupils are equal, round, and  reactive to light.  Neck:     Thyroid : No thyroid  mass or thyromegaly.     Vascular: No carotid bruit.     Trachea: Trachea normal.  Cardiovascular:     Rate and Rhythm: Normal rate and regular rhythm.     Pulses: Normal pulses.     Heart sounds: Normal heart sounds, S1 normal and S2 normal. No murmur heard.    No friction rub. No gallop.  Pulmonary:     Effort: Pulmonary effort is normal. No tachypnea or respiratory distress.     Breath sounds: Wheezing present. No decreased breath sounds, rhonchi or rales.  Abdominal:  General: Bowel sounds are normal.     Palpations: Abdomen is soft.     Tenderness: There is no abdominal tenderness.  Musculoskeletal:     Cervical back: Normal range of motion and neck supple.  Skin:    General: Skin is warm and dry.     Findings: No rash.  Neurological:     Mental Status: She is alert.  Psychiatric:        Mood and Affect: Mood is not anxious or depressed.        Speech: Speech normal.        Behavior: Behavior normal. Behavior is cooperative.        Thought Content: Thought content normal.        Judgment: Judgment normal.       Results for orders placed or performed in visit on 03/31/24  Phosphorus   Collection Time: 03/31/24  1:06 PM  Result Value Ref Range   Phosphorus 4.6 2.3 - 4.6 mg/dL  CBC with Differential/Platelet   Collection Time: 03/31/24  1:06 PM  Result Value Ref Range   WBC 7.6 4.0 - 10.5 K/uL   RBC 4.31 3.87 - 5.11 Mil/uL   Hemoglobin 12.3 12.0 - 15.0 g/dL   HCT 62.5 63.9 - 53.9 %   MCV 86.6 78.0 - 100.0 fl   MCHC 32.8 30.0 - 36.0 g/dL   RDW 84.5 88.4 - 84.4 %   Platelets 254.0 150.0 - 400.0 K/uL   Neutrophils Relative % 59.2 43.0 - 77.0 %   Lymphocytes Relative 25.5 12.0 - 46.0 %   Monocytes Relative 11.0 3.0 - 12.0 %   Eosinophils Relative 3.6 0.0 - 5.0 %   Basophils Relative 0.7 0.0 - 3.0 %   Neutro Abs 4.5 1.4 - 7.7 K/uL   Lymphs Abs 1.9 0.7 - 4.0 K/uL   Monocytes Absolute 0.8 0.1 - 1.0 K/uL    Eosinophils Absolute 0.3 0.0 - 0.7 K/uL   Basophils Absolute 0.1 0.0 - 0.1 K/uL  Comprehensive metabolic panel with GFR   Collection Time: 03/31/24  1:06 PM  Result Value Ref Range   Sodium 141 135 - 145 mEq/L   Potassium 4.3 3.5 - 5.1 mEq/L   Chloride 105 96 - 112 mEq/L   CO2 31 19 - 32 mEq/L   Glucose, Bld 86 70 - 99 mg/dL   BUN 10 6 - 23 mg/dL   Creatinine, Ser 9.33 0.40 - 1.20 mg/dL   Total Bilirubin 0.5 0.2 - 1.2 mg/dL   Alkaline Phosphatase 87 39 - 117 U/L   AST 18 0 - 37 U/L   ALT 14 0 - 35 U/L   Total Protein 6.5 6.0 - 8.3 g/dL   Albumin 3.8 3.5 - 5.2 g/dL   GFR 19.77 >39.99 mL/min   Calcium 8.8 8.4 - 10.5 mg/dL  Magnesium   Collection Time: 03/31/24  1:06 PM  Result Value Ref Range   Magnesium 1.9 1.5 - 2.5 mg/dL    Assessment and Plan  Community acquired pneumonia, unspecified laterality Assessment & Plan: Acute, status post antibiotics with significant improvement.  Orders: -     Phosphorus -     Magnesium  Acute pulmonary embolism without acute cor pulmonale, unspecified pulmonary embolism type [facility]) Assessment & Plan: Acute, recurrent Patient now on lifelong Eliquis .  She is having some trouble affording so have placed a referral to care management pharmacy for medication assistance.  Orders: -     AMB Referral [facility] Care Management  Hypoalbuminemia Assessment &  Plan: Due for reevaluation P.o. intake improved significantly  Orders: -     Comprehensive metabolic panel with GFR  Leukocytosis, unspecified type Assessment & Plan: Acute, likely associated with recent community-acquired pneumonia.  Recheck for improvement, status post antibiotics.  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 No longer with oxygen requirement.     Return in about 3 months (around 07/01/2024) for phone AMW,  fasting labs then CPE with  me.   Greig Ring, MD

## 2024-03-31 NOTE — Patient Instructions (Addendum)
 Call to set up appt with pulmonary for follow up.  Cancel  appt with Matt.  Follow up with Rheumatology .  Keep appt with Gudena.  Referral place to pharmacist to help with cost of Eliqus.

## 2024-04-04 NOTE — Assessment & Plan Note (Signed)
 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  ER 100%, PR 70% Tis NX stage 0   Treatment plan: Adjuvant radiation 11/28/2019-12/19/2019 Patient decided not to take antiestrogen therapy because of her history of superficial venous thrombosis. Hospitalization 04/01/2020 04/02/2020: Bilateral PEs-(no clear-cut precipitating causes) Current treatment: Xarelto (discontinued 09/14/2023) Takes Humira for rheumatoid arthritis   Retesting for Lupus anti-coagulant was negative and therefore I recommended discontinuation of anticoagulation therapy at this time.    Severe constipation: Continue with MiraLAX Weight gain: She lost 25 pounds since she was in the hospital  Breast cancer surveillance: Breast exam 09/14/2023: Benign Mammogram 08/12/2023: Left breast asymmetry, diagnostic mammogram 08/26/2023: Benign, breast density category B   Return to clinic in 1 year for follow-up

## 2024-04-05 ENCOUNTER — Inpatient Hospital Stay: Attending: Psychiatry | Admitting: Hematology and Oncology

## 2024-04-05 ENCOUNTER — Telehealth: Payer: Self-pay

## 2024-04-05 VITALS — BP 167/61 | HR 77 | Temp 97.8°F | Resp 16 | Ht 62.0 in | Wt 208.4 lb

## 2024-04-05 DIAGNOSIS — N390 Urinary tract infection, site not specified: Secondary | ICD-10-CM | POA: Insufficient documentation

## 2024-04-05 DIAGNOSIS — Z8544 Personal history of malignant neoplasm of other female genital organs: Secondary | ICD-10-CM | POA: Diagnosis not present

## 2024-04-05 DIAGNOSIS — Z79899 Other long term (current) drug therapy: Secondary | ICD-10-CM | POA: Insufficient documentation

## 2024-04-05 DIAGNOSIS — M069 Rheumatoid arthritis, unspecified: Secondary | ICD-10-CM | POA: Diagnosis not present

## 2024-04-05 DIAGNOSIS — Z86000 Personal history of in-situ neoplasm of breast: Secondary | ICD-10-CM | POA: Insufficient documentation

## 2024-04-05 DIAGNOSIS — D0511 Intraductal carcinoma in situ of right breast: Secondary | ICD-10-CM | POA: Diagnosis not present

## 2024-04-05 DIAGNOSIS — Z7901 Long term (current) use of anticoagulants: Secondary | ICD-10-CM | POA: Insufficient documentation

## 2024-04-05 DIAGNOSIS — Z86711 Personal history of pulmonary embolism: Secondary | ICD-10-CM | POA: Insufficient documentation

## 2024-04-05 MED ORDER — APIXABAN 5 MG PO TABS: 5.0000 mg | ORAL_TABLET | Freq: Two times a day (BID) | ORAL | 3 refills | Status: AC

## 2024-04-05 NOTE — Progress Notes (Signed)
 Patient Care Team: Avelina Greig BRAVO, MD as PCP - General (Family Medicine) Delford Maude BROCKS, MD as PCP - Cardiology (Cardiology) Elnor Rocky PARAS, PA-C (Inactive) as Physician Assistant (Rheumatology) Cammie Batters, OD as Referring Physician (Optometry) Joshua Sieving, MD as Consulting Physician (Dermatology) Glean Stephane BROCKS, RN (Inactive) as Oncology Nurse Navigator Tyree Nanetta SAILOR, RN as Oncology Nurse Navigator  DIAGNOSIS:  Encounter Diagnosis  Name Primary?   Ductal carcinoma in situ (DCIS) of right breast Yes    SUMMARY OF ONCOLOGIC HISTORY: Oncology History  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/27/2019 Initial Diagnosis   Patient reported two weeks of spontaneous right nipple discharge. 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 Biopsy on 07/05/19 showed fibrocystic changes in the left breast, no evidence of malignancy. Breast MRI on 08/11/19 showed a 1.7cm right nipple base mass, a 0.9cm superior central right breast mass, and indeterminate non-mass enhancement in the central left breast. Biopsy on 08/24/19 showed no evidence of malignancy in the left breast, and in the right breast, DCIS with necrosis, high grade, ER+ 100%, PR+ 70%.    09/20/2019 Surgery   Bilateral lumpectomies ([facility]): Left breast: complex sclerosing lesion with no evidence of malignancy Right breast: nipple adenoma with intraductal papilloma and DCIS, intermediate grade, 0.4cm, clear margins.   11/28/2019 -  Radiation Therapy   Adjuvant radiation     CHIEF COMPLIANT: Follow-up of acute PE and DCIS  HISTORY OF PRESENT ILLNESS:  History of Present Illness [name] is an 83 year old female who presents for follow-up regarding blood clots and medication management.  She was diagnosed with pulmonary embolism following a D-dimer level of 16 during  pneumonia treatment. Hospitalization ensued and she was diagnosed with pulmonary embolism, and she was started on Eliquis , now at a maintenance dose of 5 mg twice daily.  She has Paget's disease with residual activity post-lobectomy in August, causing gastrointestinal symptoms such as upset stomach and bowel issues. A permanent catheter is in place.  She experienced a fall in the shower without head injury or bleeding. No current symptoms of bleeding or head injury are present.     ALLERGIES:  is allergic to codeine and hydrocodone .  MEDICATIONS:  Current Outpatient Medications  Medication Sig Dispense Refill   acetaminophen  (TYLENOL ) 500 MG tablet Take 500 mg by mouth every 4 (four) hours as needed for mild pain (pain score 1-3) or moderate pain (pain score 4-6).     APIXABAN  (ELIQUIS ) VTE STARTER PACK (10MG  AND 5MG ) Take as directed on package: start with two-5mg  tablets twice daily for 7 days. On day 8, switch to one-5mg  tablet twice daily. 74 each 0   folic acid  (FOLVITE ) 1 MG tablet Take 2 mg by mouth daily.     hydroxychloroquine  (PLAQUENIL ) 200 MG tablet Take 200 mg by mouth 2 (two) times daily.     losartan  (COZAAR ) 50 MG tablet Take 1 tablet by mouth once daily 90 tablet 0   ondansetron  (ZOFRAN ) 4 MG tablet Take 1 tablet (4 mg total) by mouth every 6 (six) hours as needed for nausea. 20 tablet 0   No current facility-administered medications for this visit.   Facility-Administered Medications Ordered in Other Visits  Medication Dose Route Frequency Provider Last Rate Last Admin   regadenoson  (LEXISCAN ) injection SOLN 0.4 mg  0.4 mg Intravenous Once Hilty, Vinie BROCKS,  MD       technetium tetrofosmin  (TC-MYOVIEW ) injection 32.7 millicurie  32.7 millicurie Intravenous Once PRN Hilty, Vinie BROCKS, MD        PHYSICAL EXAMINATION: ECOG PERFORMANCE STATUS: 1 - Symptomatic but completely ambulatory  Vitals:   04/05/24 1100  BP: (!) 167/61  Pulse: 77  Resp: 16  Temp: 97.8 F (36.6  C)  SpO2: 96%   Filed Weights   04/05/24 1100  Weight: 208 lb 6.4 oz (94.5 kg)    Physical Exam   (exam performed in the presence of a chaperone)  LABORATORY DATA:  I have reviewed the data as listed    Latest Ref Rng & Units 03/31/2024    1:06 PM 03/18/2024    8:38 AM 03/17/2024   11:46 PM  CMP  Glucose 70 - 99 mg/dL 86  99  840   BUN 6 - 23 mg/dL 10  14  14    Creatinine 0.40 - 1.20 mg/dL 9.33  9.38  9.35   Sodium 135 - 145 mEq/L 141  140  135   Potassium 3.5 - 5.1 mEq/L 4.3  4.4  4.4   Chloride 96 - 112 mEq/L 105  105  101   CO2 19 - 32 mEq/L 31  26  24    Calcium 8.4 - 10.5 mg/dL 8.8  8.6  8.5   Total Protein 6.0 - 8.3 g/dL 6.5  6.7  7.0   Total Bilirubin 0.2 - 1.2 mg/dL 0.5  0.4  0.7   Alkaline Phos 39 - 117 U/L 87  76  87   AST 0 - 37 U/L 18  20  27    ALT 0 - 35 U/L 14  21  23      Lab Results  Component Value Date   WBC 7.6 03/31/2024   HGB 12.3 03/31/2024   HCT 37.4 03/31/2024   MCV 86.6 03/31/2024   PLT 254.0 03/31/2024   NEUTROABS 4.5 03/31/2024    ASSESSMENT & PLAN:  Ductal carcinoma in situ (DCIS) of right breast 09/10/2019:Bilateral lumpectomies ([facility]): Left breast: complex sclerosing lesion with no evidence of malignancy Right breast: nipple adenoma with intraductal papilloma and DCIS, intermediate grade, 0.4cm, clear margins.  ER 100%, PR 70% Tis NX stage 0   Treatment plan: Adjuvant radiation 11/28/2019-12/19/2019 Patient decided not to take antiestrogen therapy because of her history of superficial venous thrombosis. Hospitalization 04/01/2020 04/02/2020: Bilateral PEs-(no clear-cut precipitating causes) Current treatment: Xarelto  (discontinued 09/14/2023) Takes Humira for rheumatoid arthritis   Retesting for Lupus anti-coagulant was negative   Severe constipation: Continue with MiraLAX   Breast cancer surveillance: Breast exam 09/14/2023: Benign Mammogram 08/12/2023: Left breast asymmetry, diagnostic mammogram 08/26/2023: Benign, breast density  category B   Return to clinic in 1 year for follow-up ------------------------------------- Assessment and Plan Assessment & Plan Pulmonary embolism Recent pulmonary embolism post-pneumonia, treated with antibiotics and Eliquis . 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 Discussed bleeding risks. - Continue Eliquis . - Send 90-day prescription to mail order pharmacy with refills. - Discuss cost and insurance with pharmacy before order. - Monitor for bleeding signs, seek emergency care for head injury or blood in stool.  Ductal carcinoma in situ (DCIS) of right breast Five years post-DCIS diagnosis with normal mammograms, no recurrence. DCIS was precancerous, non-invasive, no metastasis concern. - Continue annual mammogram surveillance. - Oncologist visits as needed, no current concerns.      No orders of the defined types were placed in this encounter.  The patient has a good  understanding of the overall plan. she agrees with it. she will call with any problems that may develop before the next visit here. Total time spent: 30 mins including face to face time and time spent for planning, charting and co-ordination of care   Naomi MARLA Chad, MD 04/05/24

## 2024-04-05 NOTE — Telephone Encounter (Signed)
 Copied from CRM [id number]. Topic: General - Other >> Apr 05, 2024  4:23 PM [name] wrote: Reason for CRM: Pharmacy called her today and in the meantime her pharmacy's company has come with a better price so she has cancelled it. She wanted to let Arland know that the pharmacy did reach out to her but she found a good price elsewhere for medication.

## 2024-04-05 NOTE — Progress Notes (Signed)
 Complex Care Management Note  Care Guide Note 04/05/2024 Name: [name] MRN: [id number] DOB: 11-Jul-1941  [name] is a 83 y.o. year old female who sees Avelina Greig BRAVO, MD for primary care. I reached out to Dorothyann CHRISTELLA Archer by phone today to offer complex care management services.  [name] was given information about Complex Care Management services today including:   The Complex Care Management services include support from the care team which includes your Nurse Care Manager, Clinical Social Worker, or Pharmacist.  The Complex Care Management team is here to help remove barriers to the health concerns and goals most important to you. Complex Care Management services are voluntary, and the patient may decline or stop services at any time by request to their care team member.   Complex Care Management Consent Status: Patient agreed to services and verbal consent obtained.   Follow up plan:  Telephone appointment with complex care management team member scheduled for:  04/06/24 at 1:30 p.m.   Encounter Outcome:  Patient Scheduled  Dreama [facility]  [facility], [facility] Health Care Management Assistant Direct Dial: [phone]  Fax: [phone]

## 2024-04-05 NOTE — Telephone Encounter (Signed)
 Noted.  I think patient is referring to Eliquis .

## 2024-04-06 ENCOUNTER — Other Ambulatory Visit

## 2024-04-17 ENCOUNTER — Telehealth: Payer: Self-pay

## 2024-04-17 NOTE — Progress Notes (Signed)
 Spoke with patient regarding [facility] referral for medication assistance.  Patient stated she was able to receive medication at discounted rate.  Will close request at this time per patient.  Dreama [facility]  [facility], [facility] Health Care Management Assistant Direct Dial: [phone]  Fax: [phone]

## 2024-04-17 NOTE — Telephone Encounter (Signed)
 Lab results sent to Dr. Mai via Epic.

## 2024-04-17 NOTE — Telephone Encounter (Unsigned)
 Copied from CRM [id number]. Topic: Medical Record Request - Records Request >> Apr 17, 2024  1:14 PM Martinique E wrote: Reason for CRM: Patient called in stating she needs her most recent labs faxed over to her rheumatologist, Dr. Lynwood Ramsay. Patient did not have direct fax number to give agent, but stated the office has faxed paperwork over to him in the past.

## 2024-04-18 ENCOUNTER — Other Ambulatory Visit: Payer: Self-pay

## 2024-04-18 ENCOUNTER — Other Ambulatory Visit (HOSPITAL_COMMUNITY): Payer: Self-pay

## 2024-04-18 DIAGNOSIS — J189 Pneumonia, unspecified organism: Secondary | ICD-10-CM | POA: Insufficient documentation

## 2024-04-18 NOTE — Assessment & Plan Note (Addendum)
 Acute, currently on Augmentin  with minimal response.  I would like to broaden her antibiotics to levofloxacin  but she is hesitant given possible side effects and friends that have had issues with this medication.  Instead we will add doxycycline  100 mg p.o. twice daily x 10 days. Given patient age and continued issues struggling I would have a low threshold for hospital admission but patient would like to avoid this if possible. She is staying well-hydrated.  Return and ER precautions provided.

## 2024-04-18 NOTE — Assessment & Plan Note (Addendum)
 Acute, current shortness of breath with lack of improvement with antibiotics concerning for additional reason for breathing issues.  Minimal improvement with prednisone  but will have her continue.  Patient with history of pulmonary embolus initially on lifelong anticoagulation but having stopped this per recommendations several months ago. Will evaluate with lab work for secondary causes of shortness of breath including possible pulmonary embolus.

## 2024-04-18 NOTE — Assessment & Plan Note (Signed)
 Chronic, likely contributing to shortness of breath.

## 2024-04-19 DIAGNOSIS — D72829 Elevated white blood cell count, unspecified: Secondary | ICD-10-CM | POA: Insufficient documentation

## 2024-04-19 DIAGNOSIS — I5031 Acute diastolic (congestive) heart failure: Secondary | ICD-10-CM | POA: Insufficient documentation

## 2024-04-19 DIAGNOSIS — E8809 Other disorders of plasma-protein metabolism, not elsewhere classified: Secondary | ICD-10-CM | POA: Insufficient documentation

## 2024-04-19 NOTE — Assessment & Plan Note (Signed)
 Euvolemic in office today. No longer with oxygen requirement.

## 2024-04-19 NOTE — Assessment & Plan Note (Signed)
 Due for reevaluation P.o. intake improved significantly

## 2024-04-19 NOTE — Assessment & Plan Note (Signed)
 Acute, recurrent Patient now on lifelong Eliquis .  She is having some trouble affording so have placed a referral to care management pharmacy for medication assistance.

## 2024-04-19 NOTE — Assessment & Plan Note (Signed)
 Acute, likely associated with recent community-acquired pneumonia.  Recheck for improvement, status post antibiotics.

## 2024-04-19 NOTE — Assessment & Plan Note (Signed)
 Acute, status post antibiotics with significant improvement.

## 2024-04-24 ENCOUNTER — Inpatient Hospital Stay: Admitting: Psychiatry

## 2024-04-24 ENCOUNTER — Ambulatory Visit: Payer: Medicare Other | Admitting: Psychiatry

## 2024-04-24 ENCOUNTER — Encounter: Payer: Self-pay | Admitting: Psychiatry

## 2024-04-24 VITALS — BP 140/78 | HR 77 | Temp 98.0°F | Resp 19 | Wt 209.0 lb

## 2024-04-24 DIAGNOSIS — Z8544 Personal history of malignant neoplasm of other female genital organs: Secondary | ICD-10-CM

## 2024-04-24 DIAGNOSIS — N390 Urinary tract infection, site not specified: Secondary | ICD-10-CM

## 2024-04-24 DIAGNOSIS — C519 Malignant neoplasm of vulva, unspecified: Secondary | ICD-10-CM

## 2024-04-24 DIAGNOSIS — Z86000 Personal history of in-situ neoplasm of breast: Secondary | ICD-10-CM | POA: Diagnosis not present

## 2024-04-24 NOTE — Patient Instructions (Signed)
 It was a pleasure to see you in clinic today. - Exam is stable - Okay with me to keep with close observation since your are doing well symptomatically. - Return visit planned for 69mo  Thank you very much for allowing me to provide care for you today.  I appreciate your confidence in choosing our Gynecologic Oncology team at [facility].  If you have any questions about your visit today please call our office or send us  a MyChart message and we will get back to you as soon as possible.

## 2024-04-24 NOTE — Progress Notes (Signed)
 Gynecologic Oncology Return Clinic Visit  Date of Service: 04/24/2024 Referring Provider: Duwaine Blumenthal, DO  Plastic Surgeon: Dr. Catarino Urology: Valli Shank, MD; Adine Kays, MD  Assessment & Plan: [name] is a 83 y.o. 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 Catarino, postop course complicated by urinary retention currently with suprapubic catheter in place.  Recent biopsy of left perianal tissue demonstrates ongoing Paget's disease.  She presents today for follow-up.    Vulvar Paget's disease: - Pathology without any evidence of invasive carcinoma. - Positive margins as was expected. - Biopsy from 02/09/24 with ongoing paget's at left perianal area. Pt currently asymptomatic. Additional area of anterior vulva that has previously been noted and stable on exam may also possibly represent second area of ongoing involvement.  - Has consulted with Dr. Gregorio with Dermatology at [facility]. Discussed Mohs, topical, observation. - Given that she is currently asymptomatic, pt favors observation. I think this is reasonable.  - Continue surveillance q74mo. Pt would like to space. Given that plan is observation until symptomatic, okay if at 3 months she wishes to push follow-up to 89mo.   Urinary retention: - Continue with suprapubic catheter at this time. Overall going well - Following with Dr. Kays at [facility] - Pt and family with questions regarding consideration of clamp trial. Reviewed my concerns (urethral obstruction, retention, UTI, incontinence, irritation). Since she is doing well with suprapubic catheter, I would favor continuing. But if she desired to consider this, would recommend that this be done under close care with Urology.   RTC 274mo (okay to push to 89mo if pt doing well)  Hoy Masters, MD Gynecologic  Oncology  Medical Decision Making I personally spent  TOTAL 30 minutes face-to-face and non-face-to-face in the care of this patient, which includes all pre, intra, and post visit time on the date of service.   ----------------------- Reason for Visit: Follow-up  Treatment History: Oncology History  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/27/2019 Initial Diagnosis   Patient reported two weeks of spontaneous right nipple discharge. 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 Biopsy on 07/05/19 showed fibrocystic changes in the left breast, no evidence of malignancy. Breast MRI on 08/11/19 showed a 1.7cm right nipple base mass, a 0.9cm superior central right breast mass, and indeterminate non-mass enhancement in the central left breast. Biopsy on 08/24/19 showed no evidence of malignancy in the left breast, and in the right breast, DCIS with necrosis, high grade, ER+ 100%, PR+ 70%.    09/20/2019 Surgery   Bilateral lumpectomies ([facility]): Left breast: complex sclerosing lesion with no evidence of malignancy Right breast: nipple adenoma with intraductal papilloma and DCIS, intermediate grade, 0.4cm, clear margins.   11/28/2019 -  Radiation Therapy   Adjuvant radiation     Interval History: Presents today with one of her daughters with another daughter on the phone.    Recently was treated for postviral pneumonia. Subsequently was admitted to the hospital for an acute PE. Was restarted on eliquis .  Feels that she is currently back to normal breathing.  She was seen by Dr. Gregorio with [facility] Dermatology on 02/09/24. At that time they performed two shave biopsies, the one of the right positive for Paget's. They  discussed Mohs, efudex, observation.  Patient reports that following this discussion given that she is currently asymptomatic and managing well with the  suprapubic catheter, undergoing changes monthly, she feels that she would like to likely proceed with close observation.  Also does not think she wants to proceed with urethral reconstruction.  Feels that she has noted some occasional memory loss, although her family feels like she is still sharp, and so is worried about any anesthesia.    Past Medical/Surgical History: 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   Personal history of radiation therapy 2020   right breast   Phlebitis of leg, right, superficial 1986/1989   x 2   RA (rheumatoid arthritis) (HCC)    Urinary incontinence     Past Surgical History:  Procedure Laterality Date   BOTOX  INJECTION N/A 04/20/2023   Procedure: BOTOX  INJECTION 100 UNITS;  Surgeon: Elisabeth Valli BIRCH, MD;  Location: [facility];  Service: Urology;  Laterality: N/A;   BREAST BIOPSY Left 07/05/2019   BREAST BIOPSY Bilateral 08/24/2019   BREAST EXCISIONAL BIOPSY Left 09/20/2019   BREAST LUMPECTOMY Right 09/20/2019   BREAST LUMPECTOMY WITH RADIOACTIVE SEED LOCALIZATION Bilateral 09/20/2019   Procedure: BILATERAL BREAST LUMPECTOMIES WITH BILATERAL RADIOACTIVE SEED LOCALIZATION AND RIGHT NIPPLE BIOPSY;  Surgeon: Vanderbilt Ned, MD;  Location: [facility];  Service: General;  Laterality: Bilateral;   CARDIAC CATHETERIZATION  1990s   negative    CARDIOVASCULAR STRESS TEST  2008   NML   CARDIOVASCULAR STRESS TEST  02/02/2007   EF 70%, NO ISCHEMIA   CARPAL TUNNEL RELEASE Bilateral    CARPAL TUNNEL RELEASE Bilateral    CYSTOSCOPY WITH INJECTION N/A 04/20/2023   Procedure: CYSTOSCOPY WITH INJECTION OF LUEVENIA;  Surgeon: Elisabeth Valli BIRCH, MD;  Location: [facility];  Service: Urology;  Laterality: N/A;  45 MINS   DILATION AND CURETTAGE, DIAGNOSTIC / THERAPEUTIC  2008   LESION DESTRUCTION  10/2017   Face; Dr. Joshua   ROTATOR CUFF REPAIR  1999   Right, left in 2002   TOTAL KNEE ARTHROPLASTY  2008   Left   TOTAL KNEE ARTHROPLASTY  10/11/2012   Procedure: TOTAL KNEE ARTHROPLASTY;  Surgeon: Donnice BIRCH Car, MD;  Location: [facility];  Service: Orthopedics;  Laterality: Right;   US  ECHOCARDIOGRAPHY  02/07/2007   EF 55-60%    Family History  Problem Relation Age of Onset   Hypertension Mother    Heart attack Father 87   Hypertension Father    Peptic Ulcer Disease Father    Heart defect Sister    Lung cancer Brother    Coronary artery disease Other        Female 1st degree relative <50   Colon cancer  Neg Hx    Prostate cancer Neg Hx    Ovarian cancer Neg Hx    Pancreatic cancer Neg Hx    Endometrial cancer Neg Hx    Breast cancer Neg Hx    BRCA 1/2 Neg Hx     Social History   Socioeconomic History   Marital status: Married    Spouse name: Not on file   Number of children: Not on file   Years of education: Not on file   Highest education level: Not on file  Occupational History   Occupation: Retired  Tobacco Use   Smoking status: Former    Current packs/day: 0.00    Average packs/day: 0.5 packs/day for 11.0 years (5.5 ttl pk-yrs)    Types: Cigarettes    Start date: 65    Quit date: 10/06/1963    Years since quitting: 60.5   Smokeless tobacco: Never  Vaping Use   Vaping status: Never Used  Substance and Sexual Activity   Alcohol use: No    Comment: stopped 5 years ago   Drug use: No   Sexual  activity: Yes  Other Topics Concern   Not on file  Social History Narrative   Regular exercise: yes walks 1 mile a day   Diet: loves butter, fruit and veggies   Social Drivers of Health   Financial Resource Strain: Low Risk  (05/14/2023)   Received from [facility]   Overall Financial Resource Strain (CARDIA)    Difficulty of Paying Living Expenses: Not hard at all  Food Insecurity: No Food Insecurity (03/18/2024)   Hunger Vital Sign    Worried About Running Out of Food in the Last Year: Never true    Ran Out of Food in the Last Year: Never true  Transportation Needs: No Transportation Needs (03/18/2024)   PRAPARE - Administrator, Civil Service (Medical): No    Lack of Transportation (Non-Medical): No  Physical Activity: Sufficiently Active (02/25/2022)   Exercise Vital Sign    Days of Exercise per Week: 7 days    Minutes of Exercise per Session: 60 min  Stress: No Stress Concern Present (02/25/2022)   [employer] of Occupational Health - Occupational Stress Questionnaire    Feeling of Stress : Not at all  Social Connections: Socially Integrated (03/17/2024)   Social Connection and Isolation Panel    Frequency of Communication with Friends and Family: More than three times a week    Frequency of Social Gatherings with Friends and Family: More than three times a week    Attends Religious Services: More than 4 times per year    Active Member of [employer] or Organizations: Yes    Attends Engineer, structural: More than 4 times per year    Marital Status: Married    Current Medications:  Current Outpatient Medications:    acetaminophen  (TYLENOL ) 500 MG tablet, Take 500 mg by mouth every 4 (four) hours as needed for mild pain (pain score 1-3) or moderate pain (pain score 4-6)., Disp: , Rfl:    apixaban  (ELIQUIS ) 5 MG TABS tablet, Take 1 tablet (5 mg total) by mouth 2 (two) times daily., Disp: 180 tablet, Rfl: 3   folic acid  (FOLVITE ) 1 MG tablet, Take 2 mg by  mouth daily., Disp: , Rfl:    hydroxychloroquine  (PLAQUENIL ) 200 MG tablet, Take 200 mg by mouth 2 (two) times daily., Disp: , Rfl:    losartan  (COZAAR ) 50 MG tablet, Take 1 tablet by mouth once  daily, Disp: 90 tablet, Rfl: 0   ondansetron  (ZOFRAN ) 4 MG tablet, Take 1 tablet (4 mg total) by mouth every 6 (six) hours as needed for nausea., Disp: 20 tablet, Rfl: 0 No current facility-administered medications for this visit.  Facility-Administered Medications Ordered in Other Visits:    regadenoson  (LEXISCAN ) injection SOLN 0.4 mg, 0.4 mg, Intravenous, Once, Hilty, Vinie BROCKS, MD   technetium tetrofosmin  (TC-MYOVIEW ) injection 32.7 millicurie, 32.7 millicurie, Intravenous, Once PRN, Hilty, Vinie BROCKS, MD  Review of Symptoms: Complete 10-system review is positive for: hearing loss  Physical Exam: BP (!) 140/78 (BP Location: Right Arm, Patient Position: Sitting)   Pulse 77   Temp 98 F (36.7 C) (Oral)   Resp 19   Wt 209 lb (94.8 kg)   SpO2 95%   BMI 38.23 kg/m  General: Alert, oriented, no acute distress. HEENT: Normocephalic, atraumatic. Neck symmetric without masses. Sclera anicteric.  Chest: Normal work of breathing.  Abdomen: Soft, nontender.   Extremities: Grossly normal range of motion.  Warm. Erythema of bilateral lower extremities and skin changes c/w with venous stasis. Skin: No rashes or lesions noted. GU: External genitalia well-healed from prior vulvectomy. Erythema along scar anteriorly near mons, asymptomatic, stable.  Small vaginal opening. Suprapubic catheter in place, no erythema, small area of granulation tissue, clear urine. Unable to visualize the urethral opening. Erythema, inflamed bilaterally near anal opening, asymptomatic, stable.  Exam chaperoned by Eleanor Epps, NP    Laboratory & Radiologic Studies: Surgical pathology (02/09/24): Diagnosis   A.  Left perianal, shave - Skin, negative for Paget's disease.   B.  Right perianal, shave - Intraepidermal carcinoma  (Paget's disease), extending to edge.

## 2024-04-25 ENCOUNTER — Encounter: Admitting: Nurse Practitioner

## 2024-04-28 ENCOUNTER — Other Ambulatory Visit: Payer: Self-pay | Admitting: Nurse Practitioner

## 2024-04-28 DIAGNOSIS — I10 Essential (primary) hypertension: Secondary | ICD-10-CM

## 2024-05-02 ENCOUNTER — Telehealth: Payer: Self-pay | Admitting: Family Medicine

## 2024-05-02 DIAGNOSIS — I10 Essential (primary) hypertension: Secondary | ICD-10-CM

## 2024-05-02 MED ORDER — LOSARTAN POTASSIUM 50 MG PO TABS: 50.0000 mg | ORAL_TABLET | Freq: Every day | ORAL | 1 refills | Status: DC

## 2024-05-02 NOTE — Telephone Encounter (Signed)
 Copied from CRM [id number]. Topic: Clinical - Medication Question >> May 02, 2024  9:24 AM Revonda D wrote: Reason for CRM: Pt stated that the pharmacy sent a refill request for the losartan  (COZAAR ) 50 MG tablet. Pt stated that the medication was created by [facility] Wend[facility] PIETY, but Dr.Bedsole is her provider. Pt would like to know if the refill request could be approved and also receive a callback with an update.

## 2024-05-02 NOTE — Addendum Note (Signed)
 Addended by: WENDELL ARLAND RAMAN on: 05/02/2024 11:45 AM   Modules accepted: Orders

## 2024-05-02 NOTE — Telephone Encounter (Signed)
 Refill sent as requested.  [name] notified of this via telephone.     Please schedule Medicare Wellness with Erminio and CPE with fasting labs prior with Dr. Avelina.

## 2024-05-02 NOTE — Telephone Encounter (Signed)
 Unable to leave vm.

## 2024-07-13 ENCOUNTER — Other Ambulatory Visit: Payer: Self-pay | Admitting: Hematology and Oncology

## 2024-07-13 DIAGNOSIS — Z1231 Encounter for screening mammogram for malignant neoplasm of breast: Secondary | ICD-10-CM

## 2024-07-19 ENCOUNTER — Encounter (HOSPITAL_COMMUNITY): Payer: Self-pay

## 2024-07-19 ENCOUNTER — Telehealth: Payer: Self-pay | Admitting: *Deleted

## 2024-07-19 ENCOUNTER — Emergency Department (HOSPITAL_COMMUNITY)
Admission: EM | Admit: 2024-07-19 | Discharge: 2024-07-20 | Disposition: A | Discharge status: Home / Self Care | Attending: Emergency Medicine | Admitting: Emergency Medicine

## 2024-07-19 ENCOUNTER — Other Ambulatory Visit: Payer: Self-pay

## 2024-07-19 DIAGNOSIS — Z79899 Other long term (current) drug therapy: Secondary | ICD-10-CM | POA: Insufficient documentation

## 2024-07-19 DIAGNOSIS — Z466 Encounter for fitting and adjustment of urinary device: Secondary | ICD-10-CM | POA: Insufficient documentation

## 2024-07-19 DIAGNOSIS — I10 Essential (primary) hypertension: Secondary | ICD-10-CM | POA: Diagnosis not present

## 2024-07-19 DIAGNOSIS — Z853 Personal history of malignant neoplasm of breast: Secondary | ICD-10-CM | POA: Diagnosis not present

## 2024-07-19 DIAGNOSIS — Z7901 Long term (current) use of anticoagulants: Secondary | ICD-10-CM | POA: Insufficient documentation

## 2024-07-19 NOTE — Telephone Encounter (Signed)
 Spoke with Ms. Tomassi who called to reschedule her appointment with Dr. Eldonna on  Monday, 10/20 for 3 months later. Patient states she will be following up with Dr. Levonia and doesn't need to see Dr. Eldonna so soon, she would prefer to wait until January as discussed with Dr. Eldonna.   Patient's appointment has been moved from 10/20 to 1/26 at 2:30 pm. Pt agreed to date and time and had no further concerns at this time.

## 2024-07-19 NOTE — ED Triage Notes (Signed)
 PT had a catheter in place. It has been causing her pain and the catheter fell out with the balloon still intact and inflated.

## 2024-07-20 NOTE — Discharge Instructions (Signed)
 You were seen today for Foley catheter replacement.  It appears to be draining well.  Follow-up at [facility] urology as scheduled.

## 2024-07-20 NOTE — ED Provider Notes (Signed)
 [place] EMERGENCY DEPARTMENT AT [facility] Provider Note   CSN: [id number] Arrival date & time: 07/19/24  2303     Patient presents with: Catheter issue   [name] is a 83 y.o. female.   HPI     This is an 83 year old female with a history of Paget's disease and reconstruction who presents with her Foley catheter having fallen out.  Patient previously had a suprapubic catheter; however the tract closed up after a prior dislodgment of the catheter.  Over the last month she has had an indwelling urethral catheter.  She states she has had some general discomfort.  Tonight she noted that the catheter became displaced with the balloon intact.  She has not had any blood but has had some urinary leakage.  Denies any abdominal pain.  Prior to Admission medications   Medication Sig Start Date End Date Taking? Authorizing Provider  acetaminophen  (TYLENOL ) 500 MG tablet Take 500 mg by mouth every 4 (four) hours as needed for mild pain (pain score 1-3) or moderate pain (pain score 4-6).    [provider]  apixaban  (ELIQUIS ) 5 MG TABS tablet Take 1 tablet (5 mg total) by mouth 2 (two) times daily. 04/05/24   Gudena, Vinay, MD  folic acid  (FOLVITE ) 1 MG tablet Take 2 mg by mouth daily.    [provider]  hydroxychloroquine  (PLAQUENIL ) 200 MG tablet Take 200 mg by mouth 2 (two) times daily. 12/18/20   [provider]  losartan  (COZAAR ) 50 MG tablet Take 1 tablet (50 mg total) by mouth daily. 05/02/24   Bedsole, Amy E, MD  ondansetron  (ZOFRAN ) 4 MG tablet Take 1 tablet (4 mg total) by mouth every 6 (six) hours as needed for nausea. 03/18/24   Sherrill Cable Latif, DO    Allergies: Codeine and Hydrocodone     Review of Systems  Constitutional:  Negative for fever.  Gastrointestinal:  Negative for abdominal pain.  All other systems reviewed and are negative.   Updated Vital Signs BP (!) 168/62 (BP Location: Left Arm)   Pulse 79   Temp 98.7 F (37.1  C) (Oral)   Resp 18   SpO2 94%   Physical Exam Vitals and nursing note reviewed. Exam conducted with a chaperone present.  Constitutional:      Appearance: She is well-developed. She is obese. She is not ill-appearing.  HENT:     Head: Normocephalic and atraumatic.  Eyes:     Pupils: Pupils are equal, round, and reactive to light.  Cardiovascular:     Rate and Rhythm: Normal rate and regular rhythm.  Pulmonary:     Effort: Pulmonary effort is normal. No respiratory distress.  Abdominal:     Palpations: Abdomen is soft.     Tenderness: There is no abdominal tenderness.  Genitourinary:    Comments: Patient with reconstruction and no visible vaginal introitus, slight erythema noted Musculoskeletal:     Cervical back: Neck supple.  Skin:    General: Skin is warm and dry.  Neurological:     Mental Status: She is alert and oriented to person, place, and time.  Psychiatric:        Mood and Affect: Mood normal.     (all labs ordered are listed, but only abnormal results are displayed) Labs Reviewed - No data to display  EKG: None  Radiology: No results found.   BLADDER CATHETERIZATION  Date/Time: 07/20/2024 12:54 AM  Performed by: Bari Charmaine FALCON, MD Authorized by: Bari Charmaine FALCON,  MD   Consent:    Consent obtained:  Verbal   Consent given by:  Patient   Risks, benefits, and alternatives were discussed: yes     Risks discussed:  False passage and urethral injury   Alternatives discussed:  No treatment Universal protocol:    Patient identity confirmed:  Verbally with patient Pre-procedure details:    Procedure purpose:  Therapeutic   Preparation: Patient was prepped and draped in usual sterile fashion   Procedure details:    Provider performed due to:  Altered anatomy   Altered anatomy details: reconstructive surgery.   Catheter insertion:  Indwelling   Catheter type:  Foley   Catheter size:  14 Fr   Bladder irrigation: no     Number of attempts:  1    Urine characteristics:  Clear Post-procedure details:    Procedure completion:  Tolerated    Medications Ordered in the ED - No data to display  Clinical Course as of 07/20/24 0054  Thu Jul 20, 2024  0053 Catheter in place, draining without issue [CH]    Clinical Course User Index [CH] [name], Charmaine FALCON, MD                                 Medical Decision Making  This patient presents to the ED for concern of catheter dislodgment, this involves an extensive number of treatment options, and is a complaint that carries with it a high risk of complications and morbidity.  I considered the following differential and admission for this acute, potentially life threatening condition.  The differential diagnosis includes dislodgment, urethral injury  MDM:    This is an 83 year old female who presents with concerns for Foley catheter being dislodged.  Balloon was intact.  She does not have any residual discomfort or hematuria that she has noted.  She is incontinent.  She has had reconstructive surgery and does not have a vaginal introitus.  For this reason, I replaced the Foley catheter at the bedside.  Had good return of clear urine.  Patient was observed for approximately 30 minutes with continued appropriate drainage into the Foley bag.  Will follow-up with urology.  Do not feel she needs lab work or imaging.  (Labs, imaging, consults)  Labs: I Ordered, and personally interpreted labs.  The pertinent results include: None  Imaging Studies ordered: I ordered imaging studies including none I independently visualized and interpreted imaging. I agree with the radiologist interpretation  Additional history obtained from chart review.  External records from outside source obtained and reviewed including prior evaluations  Cardiac Monitoring: The patient was not maintained on a cardiac monitor.  If on the cardiac monitor, I personally viewed and interpreted the cardiac monitored which showed  an underlying rhythm of: N/A  Reevaluation: After the interventions noted above, I reevaluated the patient and found that they have :improved  Social Determinants of Health:  lives independently  Disposition: Discharge  Co morbidities that complicate the patient evaluation  Past Medical History:  Diagnosis Date   Ambulates with cane    Antiphospholipid antibody syndrome    Breast cancer    right   Chest discomfort on left    when lies down, she has to sit up   chronic sob on occasion    Coronary artery calcification seen on CT scan 04/15/2023   Dysrhythmia    PSVT/ palpitations-  controlled with prn Inderal    History of pulmonary  embolism    2020 or 2021   Hyperlipidemia    statin intolerant   Hypertension    Interstitial lung disease (HCC)    Left arm numbness    at rest and with activity   Migraines    Obesity    Osteoarthritis    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   I have reviewed the patients home medicines and have made adjustments as needed  Problem List / ED Course: Problem List Items Addressed This Visit   None Visit Diagnoses       Encounter for Foley catheter replacement    -  Primary                Final diagnoses:  Encounter for Foley catheter replacement    ED Discharge Orders     None          Bari Charmaine FALCON, MD 07/20/24 [phone]

## 2024-07-24 ENCOUNTER — Inpatient Hospital Stay: Admitting: Psychiatry

## 2024-08-10 ENCOUNTER — Ambulatory Visit
Admission: RE | Admit: 2024-08-10 | Discharge: 2024-08-10 | Disposition: A | Discharge status: Home / Self Care | Source: Ambulatory Visit | Attending: Hematology and Oncology | Admitting: Hematology and Oncology

## 2024-08-10 DIAGNOSIS — Z1231 Encounter for screening mammogram for malignant neoplasm of breast: Secondary | ICD-10-CM

## 2024-08-16 LAB — OPHTHALMOLOGY REPORT-SCANNED

## 2024-08-21 ENCOUNTER — Ambulatory Visit: Admitting: Primary Care

## 2024-08-30 ENCOUNTER — Telehealth (HOSPITAL_BASED_OUTPATIENT_CLINIC_OR_DEPARTMENT_OTHER): Payer: Self-pay

## 2024-08-30 NOTE — Telephone Encounter (Signed)
   Name: [name]  DOB: 1940-10-25  MRN: [id number]  Primary Cardiologist: Maude Emmer, MD Last OV 04/2023   Chart reviewed as part of pre-operative protocol coverage. Because of [name] past medical history and time since last visit, she will require a follow-up in-office visit in order to better assess preoperative cardiovascular risk.  Pre-op covering staff: - Please schedule appointment and call patient to inform them. If patient already had an upcoming appointment within acceptable timeframe, please add pre-op clearance to the appointment notes so provider is aware. - Please contact requesting surgeon's office via preferred method (i.e, phone, fax) to inform them of need for appointment prior to surgery.  Patients Eliquis  per notes has been managed by her oncologist, recommendations will need to come from prescribing provider.   [name] D Orlene Salmons, NP  08/30/2024, 12:22 PM

## 2024-08-30 NOTE — Telephone Encounter (Signed)
   Pre-operative Risk Assessment    Patient Name: [name]  DOB: 1941-08-08 MRN: [id number]   Date of last office visit: 01/15/2023 with Glendia Ferrier, PA Date of next office visit: None  Request for Surgical Clearance    Procedure:  Urethroplasty   Date of Surgery:  Clearance TBD                                 Surgeon:  Dr. Arvella Finical Surgeon's Group or Practice Name:  [facility] - Urology  Phone number:  [phone] Fax number:  [phone] Attn: [facility]   Type of Clearance Requested:   - Medical  - Pharmacy:  Hold Apixaban  (Eliquis ) -Needs instructions   Type of Anesthesia:  Not specified   Additional requests/questions:  None  Signed, Patrcia Iverson CROME   08/30/2024, 11:58 AM

## 2024-08-30 NOTE — Telephone Encounter (Signed)
 S/w the pt and she said procedure not scheduled and she has some things she has to get done before she has procedure. Pt asked could she call us  back when she is ready to schedule appt in office for preop clearance. I said that will be fine.  Will remove from call back pool. Will update the requesting office.

## 2024-09-06 NOTE — Telephone Encounter (Signed)
 Pt called back ready to schedule! Scheduled 12/9 at 9:40 with J. Emelia.

## 2024-09-10 NOTE — Progress Notes (Deleted)
 Cardiology Clinic Note   Patient Name: [name] Date of Encounter: 09/10/2024  Primary Care Provider:  Avelina Greig BRAVO, MD Primary Cardiologist:  Maude Emmer, MD  Patient Profile    [name] 83 year old female presents to the clinic today for preoperative cardiac evaluation and follow-up evaluation of her coronary artery disease.  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   Personal history of radiation therapy 2020   right breast   Phlebitis of leg, right, superficial 1986/1989   x 2   RA (rheumatoid arthritis) (HCC)    Urinary incontinence    Past Surgical History:  Procedure Laterality Date   BOTOX  INJECTION N/A 04/20/2023   Procedure: BOTOX  INJECTION 100 UNITS;  Surgeon: Elisabeth Valli BIRCH, MD;  Location: [facility];  Service: Urology;  Laterality: N/A;   BREAST BIOPSY Left 07/05/2019   BREAST BIOPSY Bilateral 08/24/2019   BREAST EXCISIONAL BIOPSY Left 09/20/2019   BREAST LUMPECTOMY Right 09/20/2019   BREAST LUMPECTOMY WITH RADIOACTIVE SEED LOCALIZATION Bilateral 09/20/2019   Procedure: BILATERAL BREAST LUMPECTOMIES WITH BILATERAL RADIOACTIVE SEED LOCALIZATION AND RIGHT NIPPLE BIOPSY;  Surgeon: Vanderbilt Ned, MD;  Location: [facility];  Service: General;  Laterality: Bilateral;   CARDIAC CATHETERIZATION  1990s    negative   CARDIOVASCULAR STRESS TEST  2008   NML   CARDIOVASCULAR STRESS TEST  02/02/2007   EF 70%, NO ISCHEMIA   CARPAL TUNNEL RELEASE Bilateral    CARPAL TUNNEL RELEASE Bilateral    CYSTOSCOPY WITH INJECTION N/A 04/20/2023   Procedure: CYSTOSCOPY WITH INJECTION OF LUEVENIA;  Surgeon: Elisabeth Valli BIRCH, MD;  Location: [facility];  Service: Urology;  Laterality: N/A;  45 MINS   DILATION AND CURETTAGE, DIAGNOSTIC / THERAPEUTIC  2008   LESION DESTRUCTION  10/2017   Face; Dr. Joshua   ROTATOR CUFF REPAIR  1999   Right, left in 2002   TOTAL KNEE ARTHROPLASTY  2008   Left   TOTAL KNEE ARTHROPLASTY  10/11/2012   Procedure: TOTAL KNEE ARTHROPLASTY;  Surgeon: Donnice BIRCH Car, MD;  Location: [facility];  Service: Orthopedics;  Laterality: Right;   US  ECHOCARDIOGRAPHY  02/07/2007   EF 55-60%    Allergies  Allergies  Allergen Reactions   Codeine Nausea And Vomiting   Hydrocodone  Other (See Comments)    Hallucinations    History of Present Illness    [name] has a PMH of coronary artery disease status post nuclear stress test 12/20 which showed low risk and normal EF of 61%, normal stress testing 04/19/2023., echocardiogram 3/22 with an EF of 60-65%, G1 DD, trivial mitral valve regurgitation, trivial AI, AV sclerosis RAP 3, SVT, HTN, HLD, diabetes melitis,  GERD, ILD, breast CVA, pulmonary fibrosis, and RA.  She was seen in follow-up by Glendia Ferrier, PA-C on 04/16/2023.  During that time she presented with her daughter.  She was scheduled for surgery at [facility] the first week of August.  She was limited in her physical activity due to her RA.  She reported left arm numbness.  This would happen with rest and with activity.  She also noted some chest discomfort on the left with laying down at night.  This would make her sit up.  She denied orthopnea, lower extremity swelling, and syncope.  Nuclear stress test was ordered for further evaluation and was noted to be low risk and  normal.  She presents to the clinic today for follow-up evaluation and preoperative cardiac evaluation.  She states***.  *** denies chest pain, shortness of breath, lower extremity edema, fatigue, palpitations, melena, hematuria, hemoptysis, diaphoresis, weakness, presyncope, syncope, orthopnea, and PND.  Coronary artery disease-no chest pain today.  Continues to note occasional episodes of left arm discomfort.  EKG today shows***.  This is unchanged.  Her activity continues to be limited due to her RA.  She is statin intolerant.  She is not on aspirin  due to anticoagulation. Heart healthy low-sodium diet Increase physical activity as tolerated  Hyperlipidemia-LDL***.  She is statin intolerant. Increase physical activity as tolerated High-fiber diet  PSVT-denies recent episodes of accelerated or irregular heartbeat.  EKG today shows***. Avoid triggers caffeine, chocolate, EtOH, dehydration excetra.  Essential hypertension-BP today***. Maintain blood pressure log Continue losartan   History of PE-breathing at baseline. Eliquis  managed by oncology  Preoperative cardiac evaluation- Urethroplasty    Date of Surgery:  Clearance TBD                                  Surgeon:  Dr. Arvella Finical Surgeon's Group or Practice Name:  [facility] - Urology  Phone number:  [phone] Fax number:  [phone] Attn: [facility]     Primary Cardiologist: Maude Emmer, MD  Her RCRI is low risk, 0.9% risk of major cardiac event.  She is not able to complete greater than 4 METS of physical activity.  Due to this I will order nuclear stress testing for further prognostication.  Disposition: Follow-up with Dr. Emmer Glendia Ferrier or me in 6-9 months.     Home Medications    Prior to Admission medications   Medication Sig Start Date End Date Taking? Authorizing Provider  acetaminophen  (TYLENOL ) 500 MG tablet Take 500 mg by mouth every 4 (four) hours as needed for mild pain (pain score  1-3) or moderate pain (pain score 4-6).    [provider]  apixaban  (ELIQUIS ) 5 MG TABS tablet Take 1 tablet (5 mg total) by mouth 2 (two) times daily. 04/05/24   Odean Potts, MD  folic acid  (FOLVITE ) 1 MG tablet Take 2 mg by mouth daily.    [provider]  hydroxychloroquine  (PLAQUENIL ) 200 MG tablet Take 200 mg by mouth 2 (two) times daily. 12/18/20   [provider]  losartan  (COZAAR ) 50 MG tablet Take 1 tablet (50 mg total) by mouth daily. 05/02/24   Bedsole, Amy E, MD  ondansetron  (ZOFRAN ) 4 MG tablet Take 1 tablet (4 mg total) by mouth every 6 (six) hours as needed for nausea. 03/18/24   Sherrill Alejandro Donovan, DO    Family History    Family History  Problem Relation Age of Onset  Hypertension Mother    Heart attack Father 57   Hypertension Father    Peptic Ulcer Disease Father    Heart defect Sister    Lung cancer Brother    Coronary artery disease Other        Female 1st degree relative <50   Colon cancer Neg Hx    Prostate cancer Neg Hx    Ovarian cancer Neg Hx    Pancreatic cancer Neg Hx    Endometrial cancer Neg Hx    Breast cancer Neg Hx    BRCA 1/2 Neg Hx    She indicated that her mother is deceased. She indicated that her father is deceased. She indicated that both of her sisters are alive. She indicated that all of her three brothers are deceased. She indicated that the status of her neg hx is unknown. She indicated that the status of her other is unknown.  Social History    Social History   Socioeconomic History   Marital status: Married    Spouse name: Not on file   Number of children: Not on file   Years of education: Not on file   Highest education level: Not on file  Occupational History   Occupation: Retired  Tobacco Use   Smoking status: Former    Current packs/day: 0.00    Average packs/day: 0.5 packs/day for 11.0 years (5.5 ttl pk-yrs)    Types: Cigarettes    Start date: 48    Quit date: 10/06/1963    Years since quitting:  60.9   Smokeless tobacco: Never  Vaping Use   Vaping status: Never Used  Substance and Sexual Activity   Alcohol use: No    Comment: stopped 5 years ago   Drug use: No   Sexual activity: Yes  Other Topics Concern   Not on file  Social History Narrative   Regular exercise: yes walks 1 mile a day   Diet: loves butter, fruit and veggies   Social Drivers of Health   Financial Resource Strain: Low Risk (05/14/2023)   Received from [facility]   Overall Financial Resource Strain (CARDIA)    Difficulty of Paying Living Expenses: Not hard at all  Food Insecurity: No Food Insecurity (03/18/2024)   Hunger Vital Sign    Worried About Running Out of Food in the Last Year: Never true    Ran Out of Food in the Last Year: Never true  Transportation Needs: No Transportation Needs (03/18/2024)   PRAPARE - Administrator, Civil Service (Medical): No    Lack of Transportation (Non-Medical): No  Physical Activity: Sufficiently Active (02/25/2022)   Exercise Vital Sign    Days of Exercise per Week: 7 days    Minutes of Exercise per Session: 60 min  Stress: No Stress Concern Present (02/25/2022)   [employer] of Occupational Health - Occupational Stress Questionnaire    Feeling of Stress : Not at all  Social Connections: Socially Integrated (03/17/2024)   Social Connection and Isolation Panel    Frequency of Communication with Friends and Family: More than three times a week    Frequency of Social Gatherings with Friends and Family: More than three times a week    Attends Religious Services: More than 4 times per year    Active Member of [employer] or Organizations: Yes    Attends Banker Meetings: More than 4 times per year    Marital Status: Married  Catering Manager Violence: Not At Risk (03/18/2024)  Humiliation, Afraid, Rape, and Kick questionnaire    Fear of Current or Ex-Partner: No    Emotionally Abused: No    Physically Abused: No    Sexually Abused: No      Review of Systems    General:  No chills, fever, night sweats or weight changes.  Cardiovascular:  No chest pain, dyspnea on exertion, edema, orthopnea, palpitations, paroxysmal nocturnal dyspnea. Dermatological: No rash, lesions/masses Respiratory: No cough, dyspnea Urologic: No hematuria, dysuria Abdominal:   No nausea, vomiting, diarrhea, bright red blood per rectum, melena, or hematemesis Neurologic:  No visual changes, wkns, changes in mental status. All other systems reviewed and are otherwise negative except as noted above.  Physical Exam    VS:  There were no vitals taken for this visit. , BMI There is no height or weight on file to calculate BMI. GEN: Well nourished, well developed, in no acute distress. HEENT: normal. Neck: Supple, no JVD, carotid bruits, or masses. Cardiac: RRR, no murmurs, rubs, or gallops. No clubbing, cyanosis, edema.  Radials/DP/PT 2+ and equal bilaterally.  Respiratory:  Respirations regular and unlabored, clear to auscultation bilaterally. GI: Soft, nontender, nondistended, BS + x 4. MS: no deformity or atrophy. Skin: warm and dry, no rash. Neuro:  Strength and sensation are intact. Psych: Normal affect.  Accessory Clinical Findings    Recent Labs: 03/16/2024: Pro Brain Natriuretic Peptide 255.0 03/31/2024: ALT 14; BUN 10; Creatinine, Ser 0.66; Hemoglobin 12.3; Magnesium 1.9; Platelets 254.0; Potassium 4.3; Sodium 141   Recent Lipid Panel    Component Value Date/Time   CHOL 168 07/15/2022 0908   TRIG 73.0 07/15/2022 0908   HDL 49.50 07/15/2022 0908   CHOLHDL 3 07/15/2022 0908   VLDL 14.6 07/15/2022 0908   LDLCALC 104 (H) 07/15/2022 0908   LDLDIRECT 151.5 08/17/2012 0901    No BP recorded.  {Refresh Note OR Click here to enter BP  :1}***    ECG personally reviewed by me today- ***     Echocardiogram 03/18/2024  IMPRESSIONS     1. Left ventricular ejection fraction, by estimation, is 50 to 55%. The  left ventricle has low normal  function. The left ventricle has no regional  wall motion abnormalities. There is mild left ventricular hypertrophy.  Left ventricular diastolic  parameters are consistent with Grade I diastolic dysfunction (impaired  relaxation).   2. Right ventricular systolic function is normal. The right ventricular  size is mildly enlarged. There is normal pulmonary artery systolic  pressure. The estimated right ventricular systolic pressure is 29.8 mmHg.   3. The mitral valve is normal in structure. Trivial mitral valve  regurgitation. No evidence of mitral stenosis.   4. The aortic valve is tricuspid. Aortic valve regurgitation is trivial.  Aortic valve sclerosis/calcification is present, without any evidence of  aortic stenosis.   5. The inferior vena cava is normal in size with greater than 50%  respiratory variability, suggesting right atrial pressure of 3 mmHg.   FINDINGS   Left Ventricle: Left ventricular ejection fraction, by estimation, is 50  to 55%. The left ventricle has low normal function. The left ventricle has  no regional wall motion abnormalities. The left ventricular internal  cavity size was normal in size.  There is mild left ventricular hypertrophy. Left ventricular diastolic  parameters are consistent with Grade I diastolic dysfunction (impaired  relaxation).   Right Ventricle: The right ventricular size is mildly enlarged. No  increase in right ventricular wall thickness. Right ventricular systolic  function is normal.  There is normal pulmonary artery systolic pressure.  The tricuspid regurgitant velocity is 2.59   m/s, and with an assumed right atrial pressure of 3 mmHg, the estimated  right ventricular systolic pressure is 29.8 mmHg.   Left Atrium: Left atrial size was normal in size.   Right Atrium: Right atrial size was normal in size.   Pericardium: There is no evidence of pericardial effusion.   Mitral Valve: The mitral valve is normal in structure. Trivial  mitral  valve regurgitation. No evidence of mitral valve stenosis.   Tricuspid Valve: The tricuspid valve is normal in structure. Tricuspid  valve regurgitation is trivial.   Aortic Valve: The aortic valve is tricuspid. Aortic valve regurgitation is  trivial. Aortic valve sclerosis/calcification is present, without any  evidence of aortic stenosis. Aortic valve mean gradient measures 5.0 mmHg.  Aortic valve peak gradient  measures 9.5 mmHg. Aortic valve area, by VTI measures 2.01 cm.   Pulmonic Valve: The pulmonic valve was not well visualized. Pulmonic valve  regurgitation is not visualized.   Aorta: The aortic root and ascending aorta are structurally normal, with  no evidence of dilitation.   Venous: The inferior vena cava is normal in size with greater than 50%  respiratory variability, suggesting right atrial pressure of 3 mmHg.   IAS/Shunts: The interatrial septum was not well visualized.    Nuclear stress testing 04/19/2023    The study is normal. The study is low risk.   No ST deviation was noted.   Left ventricular function is normal. Nuclear stress EF: 64%. The left ventricular ejection fraction is normal (55-65%). End diastolic cavity size is normal. End systolic cavity size is normal.   Prior study available for comparison from 09/15/2019. LVEF 61%, low risk, no ischemia   Normal perfusion. LVEF 64% with normal wall motion. This is a low risk study. No change compared to a prior study in 2020.  Assessment & Plan   1.  ***   Josefa HERO. Daejah Klebba NP-C     09/10/2024, 12:41 PM [facility] [address] [place], [place] [address] Office [phone]    Notice: This dictation was prepared with Dragon dictation along with smaller phrase technology. Any transcriptional errors that result from this process are unintentional and may not be corrected upon review.   I spent***minutes examining this patient, reviewing medications,  and using patient centered shared decision making involving their cardiac care.   I spent  20 minutes reviewing past medical history,  medications, and prior cardiac tests.

## 2024-09-12 ENCOUNTER — Ambulatory Visit: Admitting: General Practice

## 2024-09-14 ENCOUNTER — Ambulatory Visit: Payer: Medicare Other | Admitting: Hematology and Oncology

## 2024-09-18 ENCOUNTER — Encounter (HOSPITAL_COMMUNITY): Payer: Self-pay

## 2024-09-18 ENCOUNTER — Emergency Department (HOSPITAL_COMMUNITY)
Admission: EM | Admit: 2024-09-18 | Discharge: 2024-09-18 | Disposition: A | Discharge status: Home / Self Care | Attending: Emergency Medicine | Admitting: Emergency Medicine

## 2024-09-18 ENCOUNTER — Other Ambulatory Visit: Payer: Self-pay

## 2024-09-18 DIAGNOSIS — T83098A Other mechanical complication of other indwelling urethral catheter, initial encounter: Secondary | ICD-10-CM | POA: Diagnosis present

## 2024-09-18 DIAGNOSIS — Z466 Encounter for fitting and adjustment of urinary device: Secondary | ICD-10-CM

## 2024-09-18 DIAGNOSIS — Y732 Prosthetic and other implants, materials and accessory gastroenterology and urology devices associated with adverse incidents: Secondary | ICD-10-CM | POA: Diagnosis not present

## 2024-09-18 DIAGNOSIS — Z79899 Other long term (current) drug therapy: Secondary | ICD-10-CM | POA: Diagnosis not present

## 2024-09-18 DIAGNOSIS — Z7901 Long term (current) use of anticoagulants: Secondary | ICD-10-CM | POA: Diagnosis not present

## 2024-09-18 MED ORDER — STERILE WATER FOR INJECTION IJ SOLN
INTRAMUSCULAR | Status: AC
  Administered 2024-09-18: 18:00:00 30 mL
  Filled 2024-09-18: qty 30

## 2024-09-18 NOTE — Discharge Instructions (Addendum)
 Schedule to see your Urologist for evaluation.  Return if any problems.

## 2024-09-18 NOTE — ED Triage Notes (Signed)
 PT states urinary catheter fell out at 1200 today. Denies abdominal pain. No distention noted. Catheter has fallen out multiple times per pt

## 2024-09-18 NOTE — ED Notes (Signed)
 Pts urinary catheter noted to be dislodged and hanging out with balloon fully inflated.

## 2024-09-18 NOTE — ED Provider Notes (Signed)
 [place] EMERGENCY DEPARTMENT AT [facility] Provider Note   CSN: [id number] Arrival date & time: 09/18/24  1430     Patient presents with: Catheter Issue (Urinary Catheter)   [name] is a 83 y.o. female.   Pt complains of foley catheter coming out.  Pt reports ballon was inflated.  Pt reports this has happened in the past.  Pt needs catheter replaced.  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  Patient reports that she is followed by urology at [facility].  Patient was straining to have a bowel movement and catheter with balloon inflated came out.        Prior to Admission medications  Medication Sig Start Date End Date Taking? Authorizing Provider  acetaminophen  (TYLENOL ) 500 MG tablet Take 500 mg by mouth every 4 (four) hours as needed for mild pain (pain score 1-3) or moderate pain (pain score 4-6).    [provider]  apixaban  (ELIQUIS ) 5 MG TABS tablet Take 1 tablet (5 mg total) by mouth 2 (two) times daily. 04/05/24   Odean Potts, MD  folic acid  (FOLVITE ) 1 MG tablet Take 2 mg by mouth daily.    [provider]  hydroxychloroquine  (PLAQUENIL ) 200 MG tablet Take 200 mg by mouth 2 (two) times daily. 12/18/20   [provider]  losartan  (COZAAR ) 50 MG tablet Take 1 tablet (50 mg total) by mouth daily. 05/02/24   Bedsole, Amy E, MD  ondansetron  (ZOFRAN ) 4 MG tablet Take 1 tablet (4 mg total) by mouth every 6 (six) hours as needed for nausea. 03/18/24   Sheikh, Omair Latif, DO    Allergies: Codeine and Hydrocodone     Review of Systems  All other systems reviewed and are negative.   Updated Vital Signs BP (!) 154/127 (BP Location: Right Arm)   Pulse 76   Temp 99.3 F (37.4 C) (Oral)   Resp 18   Ht 5' 2 (1.575 m)   Wt 94.8 kg   SpO2 97%   BMI 38.23 kg/m   Physical Exam Vitals and nursing note reviewed.  Constitutional:      Appearance: She is well-developed.  HENT:     Head:  Normocephalic.  Cardiovascular:     Rate and Rhythm: Normal rate.  Pulmonary:     Effort: Pulmonary effort is normal.  Abdominal:     General: There is no distension.  Musculoskeletal:        General: Normal range of motion.  Skin:    General: Skin is warm.  Neurological:     General: No focal deficit present.     Mental Status: She is alert and oriented to person, place, and time.     (all labs ordered are listed, but only abnormal results are displayed) Labs Reviewed - No data to display  EKG: None  Radiology: No results found.   Procedures   Medications Ordered in the ED - No data to display                                  Medical Decision Making Patient has had reconstructive bladder surgery.  Patient has a Foley catheter.  She reports that she has had the catheter come out with the balloon today and in the past  Amount and/or Complexity of Data Reviewed Independent Historian: spouse    Details: Patient is here with family who is supportive  Risk Risk Details: RN was able to replace Foley.  A Foley with a larger balloon was used.  Patient is advised to call her urologist to schedule follow-up.  Patient is discharged in stable condition.  She is advised to return if any problems        Final diagnoses:  Encounter for Foley catheter replacement    ED Discharge Orders     None      An After Visit Summary was printed and given to the patient.     Flint Sonny POUR, PA-C 09/18/24 2121    Tegeler, Lonni PARAS, MD 09/19/24 [phone]

## 2024-09-21 ENCOUNTER — Ambulatory Visit: Admitting: Primary Care

## 2024-10-20 ENCOUNTER — Ambulatory Visit: Admitting: General Practice

## 2024-10-20 ENCOUNTER — Other Ambulatory Visit: Payer: Self-pay | Admitting: Family Medicine

## 2024-10-20 DIAGNOSIS — I10 Essential (primary) hypertension: Secondary | ICD-10-CM

## 2024-10-23 NOTE — Telephone Encounter (Signed)
 Please schedule Medicare Wellness with Erminio and CPE with fasting labs with Dr. Avelina.  Per last AVS: Return in about 3 months (around 07/01/2024) for phone AMW,  fasting labs then CPE with me.

## 2024-10-30 ENCOUNTER — Inpatient Hospital Stay: Admitting: Psychiatry

## 2024-10-30 DIAGNOSIS — C519 Malignant neoplasm of vulva, unspecified: Secondary | ICD-10-CM

## 2024-10-31 ENCOUNTER — Inpatient Hospital Stay: Admitting: Psychiatry

## 2024-10-31 NOTE — Progress Notes (Signed)
 This encounter was created in error - please disregard.

## 2024-11-01 ENCOUNTER — Telehealth: Payer: Self-pay

## 2024-11-01 NOTE — Telephone Encounter (Signed)
 [name] called stating she needs to reschedule her upcoming appointment with Dr.[place] on 1/30.   She is a Dr.Newton patient and 2/3 offered. Pt declined at this time stating I just can't right now she will call the office back when she can come in.   When asked if she was ok, she stated yes.

## 2024-11-03 ENCOUNTER — Inpatient Hospital Stay: Admitting: Obstetrics & Gynecology

## 2024-11-15 ENCOUNTER — Inpatient Hospital Stay: Admitting: Obstetrics & Gynecology

## 2024-12-18 ENCOUNTER — Inpatient Hospital Stay: Admitting: Psychiatry

## 2025-05-07 ENCOUNTER — Inpatient Hospital Stay: Admitting: Psychiatry
